# Patient Record
Sex: Male | Born: 1950
Health system: Southern US, Community
[De-identification: ages and names within clinical notes are randomized; demographics above are authoritative.]

## PROBLEM LIST (undated history)

## (undated) DIAGNOSIS — C779 Secondary and unspecified malignant neoplasm of lymph node, unspecified: Secondary | ICD-10-CM

## (undated) DIAGNOSIS — R490 Dysphonia: Secondary | ICD-10-CM

## (undated) DIAGNOSIS — E079 Disorder of thyroid, unspecified: Secondary | ICD-10-CM

## (undated) DIAGNOSIS — H269 Unspecified cataract: Secondary | ICD-10-CM

## (undated) DIAGNOSIS — Z87891 Personal history of nicotine dependence: Secondary | ICD-10-CM

## (undated) DIAGNOSIS — F419 Anxiety disorder, unspecified: Secondary | ICD-10-CM

## (undated) DIAGNOSIS — K219 Gastro-esophageal reflux disease without esophagitis: Secondary | ICD-10-CM

## (undated) DIAGNOSIS — F32A Depression, unspecified: Secondary | ICD-10-CM

## (undated) DIAGNOSIS — J449 Chronic obstructive pulmonary disease, unspecified: Secondary | ICD-10-CM

## (undated) DIAGNOSIS — F329 Major depressive disorder, single episode, unspecified: Secondary | ICD-10-CM

## (undated) DIAGNOSIS — M1990 Unspecified osteoarthritis, unspecified site: Secondary | ICD-10-CM

## (undated) HISTORY — DX: Secondary and unspecified malignant neoplasm of lymph node, unspecified: C77.9

## (undated) HISTORY — PX: NECK SURGERY: SHX720

## (undated) HISTORY — DX: Dysphonia: R49.0

## (undated) HISTORY — DX: Personal history of nicotine dependence: Z87.891

## (undated) HISTORY — DX: Chronic obstructive pulmonary disease, unspecified: J44.9

## (undated) HISTORY — PX: COLONOSCOPY: SHX174

## (undated) HISTORY — PX: TONSILLECTOMY: SUR1361

---

## 2004-05-24 ENCOUNTER — Emergency Department: Payer: Self-pay | Admitting: Emergency Medicine

## 2004-10-09 ENCOUNTER — Ambulatory Visit: Payer: Self-pay | Admitting: Oncology

## 2004-10-11 ENCOUNTER — Ambulatory Visit: Payer: Self-pay | Admitting: Oncology

## 2004-10-25 ENCOUNTER — Emergency Department: Payer: Self-pay | Admitting: Internal Medicine

## 2004-11-23 ENCOUNTER — Ambulatory Visit: Payer: Self-pay | Admitting: Internal Medicine

## 2005-07-26 ENCOUNTER — Ambulatory Visit: Payer: Self-pay

## 2005-10-08 ENCOUNTER — Ambulatory Visit: Payer: Self-pay | Admitting: Oncology

## 2005-10-11 ENCOUNTER — Ambulatory Visit: Payer: Self-pay | Admitting: Oncology

## 2005-11-29 ENCOUNTER — Ambulatory Visit: Payer: Self-pay | Admitting: Gastroenterology

## 2006-06-12 ENCOUNTER — Ambulatory Visit: Payer: Self-pay | Admitting: Oncology

## 2006-07-04 ENCOUNTER — Ambulatory Visit: Payer: Self-pay | Admitting: Oncology

## 2006-07-12 ENCOUNTER — Ambulatory Visit: Payer: Self-pay | Admitting: Oncology

## 2006-07-22 ENCOUNTER — Ambulatory Visit: Payer: Self-pay | Admitting: Oncology

## 2006-08-12 ENCOUNTER — Ambulatory Visit: Payer: Self-pay | Admitting: Oncology

## 2006-08-15 ENCOUNTER — Ambulatory Visit: Payer: Self-pay | Admitting: Otolaryngology

## 2006-10-12 ENCOUNTER — Ambulatory Visit: Payer: Self-pay | Admitting: Oncology

## 2006-10-28 ENCOUNTER — Ambulatory Visit: Payer: Self-pay | Admitting: Oncology

## 2006-11-12 ENCOUNTER — Ambulatory Visit: Payer: Self-pay | Admitting: Oncology

## 2007-01-12 ENCOUNTER — Ambulatory Visit: Payer: Self-pay | Admitting: Oncology

## 2007-02-02 ENCOUNTER — Ambulatory Visit: Payer: Self-pay | Admitting: Oncology

## 2007-02-12 ENCOUNTER — Ambulatory Visit: Payer: Self-pay | Admitting: Oncology

## 2007-04-17 ENCOUNTER — Ambulatory Visit: Payer: Self-pay | Admitting: Internal Medicine

## 2008-02-09 ENCOUNTER — Ambulatory Visit: Payer: Self-pay | Admitting: Oncology

## 2008-02-12 ENCOUNTER — Ambulatory Visit: Payer: Self-pay | Admitting: Oncology

## 2008-02-15 ENCOUNTER — Ambulatory Visit: Payer: Self-pay | Admitting: Oncology

## 2008-03-11 ENCOUNTER — Ambulatory Visit: Payer: Self-pay | Admitting: Oncology

## 2009-08-21 ENCOUNTER — Ambulatory Visit: Payer: Self-pay | Admitting: Internal Medicine

## 2010-03-05 ENCOUNTER — Other Ambulatory Visit: Payer: Self-pay | Admitting: Ophthalmology

## 2010-03-05 DIAGNOSIS — H5340 Unspecified visual field defects: Secondary | ICD-10-CM

## 2010-03-10 ENCOUNTER — Ambulatory Visit
Admission: RE | Admit: 2010-03-10 | Discharge: 2010-03-10 | Disposition: A | Discharge status: Home / Self Care | Payer: BC Managed Care – PPO | Source: Ambulatory Visit | Attending: Ophthalmology | Admitting: Ophthalmology

## 2010-03-10 DIAGNOSIS — H5340 Unspecified visual field defects: Secondary | ICD-10-CM

## 2010-06-30 ENCOUNTER — Ambulatory Visit: Payer: Self-pay

## 2010-11-28 ENCOUNTER — Emergency Department: Payer: Self-pay | Admitting: Emergency Medicine

## 2011-03-15 ENCOUNTER — Ambulatory Visit: Payer: Self-pay | Admitting: Internal Medicine

## 2011-07-13 ENCOUNTER — Ambulatory Visit: Payer: Self-pay | Admitting: Oncology

## 2011-07-13 LAB — COMPREHENSIVE METABOLIC PANEL
Albumin: 3.9 g/dL (ref 3.4–5.0)
Alkaline Phosphatase: 102 U/L (ref 50–136)
Calcium, Total: 9 mg/dL (ref 8.5–10.1)
Glucose: 144 mg/dL — ABNORMAL HIGH (ref 65–99)
Osmolality: 285 (ref 275–301)
SGPT (ALT): 47 U/L
Sodium: 140 mmol/L (ref 136–145)

## 2011-07-13 LAB — CBC CANCER CENTER
Basophil #: 0 x10 3/mm (ref 0.0–0.1)
Basophil %: 0.3 %
Eosinophil %: 2.2 %
HGB: 15.2 g/dL (ref 13.0–18.0)
MCH: 31.2 pg (ref 26.0–34.0)
MCV: 95 fL (ref 80–100)
Monocyte #: 0.6 x10 3/mm (ref 0.2–1.0)
Monocyte %: 5.7 %
Neutrophil %: 74.2 %
RBC: 4.89 10*6/uL (ref 4.40–5.90)
WBC: 9.8 x10 3/mm (ref 3.8–10.6)

## 2011-08-12 ENCOUNTER — Ambulatory Visit: Payer: Self-pay | Admitting: Oncology

## 2012-07-19 ENCOUNTER — Ambulatory Visit: Payer: Self-pay | Admitting: Oncology

## 2012-07-24 LAB — CBC CANCER CENTER
Basophil #: 0 x10 3/mm (ref 0.0–0.1)
Eosinophil %: 2.5 %
HCT: 43.6 % (ref 40.0–52.0)
Lymphocyte #: 1.7 x10 3/mm (ref 1.0–3.6)
Lymphocyte %: 19.4 %
MCHC: 34.1 g/dL (ref 32.0–36.0)
MCV: 93 fL (ref 80–100)
Monocyte #: 0.6 x10 3/mm (ref 0.2–1.0)
Platelet: 223 x10 3/mm (ref 150–440)

## 2012-07-24 LAB — COMPREHENSIVE METABOLIC PANEL
Albumin: 4 g/dL (ref 3.4–5.0)
Alkaline Phosphatase: 112 U/L (ref 50–136)
Anion Gap: 3 — ABNORMAL LOW (ref 7–16)
BUN: 24 mg/dL — ABNORMAL HIGH (ref 7–18)
Calcium, Total: 8.8 mg/dL (ref 8.5–10.1)
Chloride: 108 mmol/L — ABNORMAL HIGH (ref 98–107)
Co2: 30 mmol/L (ref 21–32)
Creatinine: 1.23 mg/dL (ref 0.60–1.30)
EGFR (African American): >60
Glucose: 128 mg/dL — ABNORMAL HIGH (ref 65–99)
Sodium: 141 mmol/L (ref 136–145)

## 2012-08-11 ENCOUNTER — Ambulatory Visit: Payer: Self-pay | Admitting: Oncology

## 2013-06-08 DIAGNOSIS — K219 Gastro-esophageal reflux disease without esophagitis: Secondary | ICD-10-CM | POA: Insufficient documentation

## 2013-06-08 DIAGNOSIS — E039 Hypothyroidism, unspecified: Secondary | ICD-10-CM | POA: Insufficient documentation

## 2013-06-08 DIAGNOSIS — E038 Other specified hypothyroidism: Secondary | ICD-10-CM | POA: Insufficient documentation

## 2013-06-15 DIAGNOSIS — I10 Essential (primary) hypertension: Secondary | ICD-10-CM | POA: Insufficient documentation

## 2013-06-15 DIAGNOSIS — Z8579 Personal history of other malignant neoplasms of lymphoid, hematopoietic and related tissues: Secondary | ICD-10-CM | POA: Insufficient documentation

## 2013-08-20 ENCOUNTER — Ambulatory Visit: Payer: Self-pay | Admitting: Oncology

## 2013-08-20 LAB — COMPREHENSIVE METABOLIC PANEL
Albumin: 3.7 g/dL (ref 3.4–5.0)
Alkaline Phosphatase: 111 U/L
Anion Gap: 7 (ref 7–16)
BUN: 29 mg/dL — AB (ref 7–18)
Bilirubin,Total: 0.3 mg/dL (ref 0.2–1.0)
CHLORIDE: 105 mmol/L (ref 98–107)
CO2: 29 mmol/L (ref 21–32)
Calcium, Total: 8.8 mg/dL (ref 8.5–10.1)
Creatinine: 1.32 mg/dL — ABNORMAL HIGH (ref 0.60–1.30)
EGFR (African American): >60
EGFR (Non-African Amer.): 58 — ABNORMAL LOW
Glucose: 123 mg/dL — ABNORMAL HIGH (ref 65–99)
Osmolality: 288 (ref 275–301)
POTASSIUM: 4.1 mmol/L (ref 3.5–5.1)
SGOT(AST): 29 U/L (ref 15–37)
SGPT (ALT): 34 U/L
Sodium: 141 mmol/L (ref 136–145)
TOTAL PROTEIN: 6.9 g/dL (ref 6.4–8.2)

## 2013-08-20 LAB — CBC CANCER CENTER
BASOS ABS: 0.1 x10 3/mm (ref 0.0–0.1)
Basophil %: 0.7 %
Eosinophil #: 0.2 x10 3/mm (ref 0.0–0.7)
Eosinophil %: 3.1 %
HCT: 43.7 % (ref 40.0–52.0)
HGB: 14.6 g/dL (ref 13.0–18.0)
LYMPHS PCT: 21.3 %
Lymphocyte #: 1.6 x10 3/mm (ref 1.0–3.6)
MCH: 31.5 pg (ref 26.0–34.0)
MCHC: 33.4 g/dL (ref 32.0–36.0)
MCV: 94 fL (ref 80–100)
MONO ABS: 0.6 x10 3/mm (ref 0.2–1.0)
MONOS PCT: 7.6 %
NEUTROS PCT: 67.3 %
Neutrophil #: 5.2 x10 3/mm (ref 1.4–6.5)
Platelet: 221 x10 3/mm (ref 150–440)
RBC: 4.63 10*6/uL (ref 4.40–5.90)
RDW: 13.1 % (ref 11.5–14.5)
WBC: 7.7 x10 3/mm (ref 3.8–10.6)

## 2013-08-31 ENCOUNTER — Ambulatory Visit: Payer: Self-pay | Admitting: Family Medicine

## 2013-09-11 ENCOUNTER — Ambulatory Visit: Payer: Self-pay | Admitting: Oncology

## 2014-03-27 ENCOUNTER — Other Ambulatory Visit (INDEPENDENT_AMBULATORY_CARE_PROVIDER_SITE_OTHER): Payer: Self-pay

## 2014-03-27 DIAGNOSIS — R109 Unspecified abdominal pain: Secondary | ICD-10-CM

## 2014-03-27 DIAGNOSIS — M549 Dorsalgia, unspecified: Secondary | ICD-10-CM

## 2014-04-02 ENCOUNTER — Ambulatory Visit
Admission: RE | Admit: 2014-04-02 | Discharge: 2014-04-02 | Disposition: A | Discharge status: Home / Self Care | Payer: BLUE CROSS/BLUE SHIELD | Source: Ambulatory Visit | Attending: Surgery | Admitting: Surgery

## 2014-04-02 MED ORDER — IOPAMIDOL (ISOVUE-300) INJECTION 61%
125.0000 mL | Freq: Once | INTRAVENOUS | Status: AC | PRN
  Administered 2014-04-02: 125 mL via INTRAVENOUS

## 2014-08-23 ENCOUNTER — Other Ambulatory Visit: Payer: Self-pay | Admitting: *Deleted

## 2014-08-23 DIAGNOSIS — C760 Malignant neoplasm of head, face and neck: Secondary | ICD-10-CM

## 2014-08-26 ENCOUNTER — Inpatient Hospital Stay: Payer: BLUE CROSS/BLUE SHIELD | Admitting: Oncology

## 2014-08-26 ENCOUNTER — Inpatient Hospital Stay: Payer: BLUE CROSS/BLUE SHIELD | Attending: Oncology

## 2014-09-06 ENCOUNTER — Telehealth: Payer: Self-pay

## 2014-09-06 DIAGNOSIS — E7800 Pure hypercholesterolemia, unspecified: Secondary | ICD-10-CM | POA: Insufficient documentation

## 2014-09-06 DIAGNOSIS — Z720 Tobacco use: Secondary | ICD-10-CM | POA: Insufficient documentation

## 2014-09-06 NOTE — Telephone Encounter (Signed)
Telephone call to patient to schedule f/up CT Scan. Patient agreeable to annual scan.

## 2014-09-23 ENCOUNTER — Other Ambulatory Visit: Payer: Self-pay | Admitting: Family Medicine

## 2014-09-23 ENCOUNTER — Encounter: Payer: Self-pay | Admitting: Family Medicine

## 2014-09-23 DIAGNOSIS — Z87891 Personal history of nicotine dependence: Secondary | ICD-10-CM | POA: Insufficient documentation

## 2014-09-23 HISTORY — DX: Personal history of nicotine dependence: Z87.891

## 2014-09-25 ENCOUNTER — Inpatient Hospital Stay: Payer: BLUE CROSS/BLUE SHIELD | Attending: Oncology

## 2014-09-25 ENCOUNTER — Inpatient Hospital Stay: Payer: BLUE CROSS/BLUE SHIELD | Admitting: Oncology

## 2014-09-25 DIAGNOSIS — Z809 Family history of malignant neoplasm, unspecified: Secondary | ICD-10-CM | POA: Insufficient documentation

## 2014-09-25 DIAGNOSIS — C801 Malignant (primary) neoplasm, unspecified: Secondary | ICD-10-CM | POA: Insufficient documentation

## 2014-09-25 DIAGNOSIS — R531 Weakness: Secondary | ICD-10-CM | POA: Insufficient documentation

## 2014-09-25 DIAGNOSIS — R490 Dysphonia: Secondary | ICD-10-CM | POA: Insufficient documentation

## 2014-09-25 DIAGNOSIS — C770 Secondary and unspecified malignant neoplasm of lymph nodes of head, face and neck: Secondary | ICD-10-CM | POA: Insufficient documentation

## 2014-09-25 DIAGNOSIS — R911 Solitary pulmonary nodule: Secondary | ICD-10-CM | POA: Insufficient documentation

## 2014-09-25 DIAGNOSIS — Z9221 Personal history of antineoplastic chemotherapy: Secondary | ICD-10-CM | POA: Insufficient documentation

## 2014-09-25 DIAGNOSIS — Z79899 Other long term (current) drug therapy: Secondary | ICD-10-CM | POA: Insufficient documentation

## 2014-09-25 DIAGNOSIS — Z923 Personal history of irradiation: Secondary | ICD-10-CM | POA: Insufficient documentation

## 2014-09-25 DIAGNOSIS — F17210 Nicotine dependence, cigarettes, uncomplicated: Secondary | ICD-10-CM | POA: Insufficient documentation

## 2014-09-25 DIAGNOSIS — E039 Hypothyroidism, unspecified: Secondary | ICD-10-CM | POA: Insufficient documentation

## 2014-09-25 DIAGNOSIS — R42 Dizziness and giddiness: Secondary | ICD-10-CM | POA: Insufficient documentation

## 2014-09-25 DIAGNOSIS — R0602 Shortness of breath: Secondary | ICD-10-CM | POA: Insufficient documentation

## 2014-10-01 ENCOUNTER — Ambulatory Visit
Admission: RE | Admit: 2014-10-01 | Discharge: 2014-10-01 | Disposition: A | Discharge status: Home / Self Care | Payer: BLUE CROSS/BLUE SHIELD | Source: Ambulatory Visit | Attending: Family Medicine | Admitting: Family Medicine

## 2014-10-01 ENCOUNTER — Telehealth: Payer: Self-pay | Admitting: *Deleted

## 2014-10-01 DIAGNOSIS — Z87891 Personal history of nicotine dependence: Secondary | ICD-10-CM | POA: Diagnosis not present

## 2014-10-01 NOTE — Telephone Encounter (Signed)
For lung cancer screening, confirmed that patient is within the age range of 55-77, and asymptomatic, (no signs or symptoms of lung cancer). The patient is a current smoker, with a 91 pack year history. The shared decision making visit was done 08/31/13. Patient is agreeable for CT scan being scheduled.

## 2014-10-02 ENCOUNTER — Inpatient Hospital Stay (HOSPITAL_BASED_OUTPATIENT_CLINIC_OR_DEPARTMENT_OTHER): Payer: BLUE CROSS/BLUE SHIELD | Admitting: Oncology

## 2014-10-02 ENCOUNTER — Inpatient Hospital Stay: Payer: BLUE CROSS/BLUE SHIELD

## 2014-10-02 ENCOUNTER — Encounter: Payer: Self-pay | Admitting: Oncology

## 2014-10-02 VITALS — BP 98/68 | HR 76 | Temp 95.2°F | Wt 199.2 lb

## 2014-10-02 DIAGNOSIS — C801 Malignant (primary) neoplasm, unspecified: Secondary | ICD-10-CM

## 2014-10-02 DIAGNOSIS — E039 Hypothyroidism, unspecified: Secondary | ICD-10-CM

## 2014-10-02 DIAGNOSIS — C760 Malignant neoplasm of head, face and neck: Secondary | ICD-10-CM

## 2014-10-02 DIAGNOSIS — Z923 Personal history of irradiation: Secondary | ICD-10-CM | POA: Diagnosis not present

## 2014-10-02 DIAGNOSIS — Z9221 Personal history of antineoplastic chemotherapy: Secondary | ICD-10-CM

## 2014-10-02 DIAGNOSIS — Z809 Family history of malignant neoplasm, unspecified: Secondary | ICD-10-CM

## 2014-10-02 DIAGNOSIS — R0602 Shortness of breath: Secondary | ICD-10-CM | POA: Diagnosis not present

## 2014-10-02 DIAGNOSIS — C770 Secondary and unspecified malignant neoplasm of lymph nodes of head, face and neck: Secondary | ICD-10-CM

## 2014-10-02 DIAGNOSIS — R490 Dysphonia: Secondary | ICD-10-CM | POA: Diagnosis not present

## 2014-10-02 DIAGNOSIS — Z8579 Personal history of other malignant neoplasms of lymphoid, hematopoietic and related tissues: Secondary | ICD-10-CM

## 2014-10-02 DIAGNOSIS — F17210 Nicotine dependence, cigarettes, uncomplicated: Secondary | ICD-10-CM

## 2014-10-02 DIAGNOSIS — R42 Dizziness and giddiness: Secondary | ICD-10-CM

## 2014-10-02 DIAGNOSIS — Z79899 Other long term (current) drug therapy: Secondary | ICD-10-CM

## 2014-10-02 DIAGNOSIS — R531 Weakness: Secondary | ICD-10-CM | POA: Diagnosis not present

## 2014-10-02 DIAGNOSIS — R911 Solitary pulmonary nodule: Secondary | ICD-10-CM | POA: Diagnosis not present

## 2014-10-02 LAB — CBC WITH DIFFERENTIAL/PLATELET
Basophils Absolute: 0 10*3/uL (ref 0–0.1)
Basophils Relative: 1 %
EOS ABS: 0.2 10*3/uL (ref 0–0.7)
EOS PCT: 3 %
HCT: 49 % (ref 40.0–52.0)
Hemoglobin: 16.2 g/dL (ref 13.0–18.0)
Lymphocytes Relative: 18 %
Lymphs Abs: 1.3 10*3/uL (ref 1.0–3.6)
MCH: 31.3 pg (ref 26.0–34.0)
MCHC: 33.2 g/dL (ref 32.0–36.0)
MCV: 94.4 fL (ref 80.0–100.0)
MONOS PCT: 6 %
Monocytes Absolute: 0.5 10*3/uL (ref 0.2–1.0)
Neutro Abs: 5.4 10*3/uL (ref 1.4–6.5)
Neutrophils Relative %: 72 %
PLATELETS: 210 10*3/uL (ref 150–440)
RBC: 5.19 MIL/uL (ref 4.40–5.90)
RDW: 13 % (ref 11.5–14.5)
WBC: 7.4 10*3/uL (ref 3.8–10.6)

## 2014-10-02 LAB — COMPREHENSIVE METABOLIC PANEL
ALT: 21 U/L (ref 17–63)
ANION GAP: 8 (ref 5–15)
AST: 32 U/L (ref 15–41)
Albumin: 4.5 g/dL (ref 3.5–5.0)
Alkaline Phosphatase: 83 U/L (ref 38–126)
BUN: 17 mg/dL (ref 6–20)
CHLORIDE: 101 mmol/L (ref 101–111)
CO2: 27 mmol/L (ref 22–32)
Calcium: 9.5 mg/dL (ref 8.9–10.3)
Creatinine, Ser: 0.94 mg/dL (ref 0.61–1.24)
GFR calc non Af Amer: >60 mL/min (ref >60)
GLUCOSE: 130 mg/dL — AB (ref 65–99)
POTASSIUM: 4.1 mmol/L (ref 3.5–5.1)
SODIUM: 136 mmol/L (ref 135–145)
Total Bilirubin: 0.7 mg/dL (ref 0.3–1.2)
Total Protein: 7.4 g/dL (ref 6.5–8.1)

## 2014-10-02 NOTE — Progress Notes (Signed)
[place] @ [facility] [phone]  Fax:([phone] [place]: Dec 20, 1950  MR#: [id number]  ASN#:[id number]  Patient Care Team: No Pcp Per Patient as PCP - General (General Practice)  CHIEF COMPLAINT:  Chief Complaint  Patient presents with  . OTHER   Chief Complaint/Diagnosis:   1. 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  In February of 2001 2. Continuing tobacco abuse HPI:    VISIT DIAGNOSIS Metastatic poorly differentiated carcinoma to lymph node unknown primary   No history exists.    No flowsheet data found.  INTERVAL HISTORY:  64 year old gentleman came today to see me with a history of complaining hoarseness of voice patient was not seen by an ENT physician in last few years. Hoarseness of voice started 6 months to  12 months duration Also complains of dizziness.  Patient works out in the heat in her oral intake of fluid is much less. Patient continues to smoke and had down low dose CT scan done yesterday and desires to know the result. Patient is here for further follow-up regarding metastases poorly differentiated carcinoma to lymph node unknown primary status post chemotherapy radiation and resection in February of 2001 REVIEW OF SYSTEMS:   Gen. status: Patient is feeling somewhat weak and tired and feeling dizzy. Patient works outside   In heat and the sweats profusely\ HEENT: Dryness in the mouth hoarseness of voice no difficulty swallowing Lungs: Shortness of breath on exertion patient continues to smoke no hemoptysis GI: No nausea no vomiting no diarrhea no significant weight loss GU: No dysuria hematuria Skin: No rash no rash all other systems have been reviewed no positive findings Neurological system: Other than dizziness does not have any headache or any other significant problem As per HPI.  Otherwise, a complete review of systems is negatve.  PAST MEDICAL HISTORY: Past Medical History  Diagnosis Date  . Personal history of tobacco use, presenting hazards to health 09/23/2014    Smoking History: Smoking History 1 Packs per day. Smoking Cessation Information Given to Patient .  PFSH: Comments: carcinoma of breast in mother carcinoma of uterus melanoma and bladder in the family history of hypertension in the family  Social History: positive tobacco  Comments: continues to smoke,  Works full-time as maintenance man  Additional Past Medical and Surgical History: tonsillectomy in the past   Hypothyroidism      ADVANCED DIRECTIVES:  Patient does have advance healthcare directive, Patient   does not desire to make any changes  HEALTH MAINTENANCE: Social History  Substance Use Topics  . Smoking status: Current Every Day Smoker  . Smokeless tobacco: None  . Alcohol Use: None      No Known Allergies  Current Outpatient Prescriptions  Medication Sig Dispense Refill  . ALPRAZolam (XANAX) 0.25 MG tablet Take by mouth.    Marland Kitchen ibuprofen (ADVIL,MOTRIN) 800 MG tablet TAKE 1 TABLET BY MOUTH EVERY 6 HOURS AS NEEDED    . levothyroxine (SYNTHROID, LEVOTHROID) 88 MCG tablet TAKE 1 TABLET BY MOUTH ONCE A DAY ON AN EMPTY STOMACH.    . meloxicam (MOBIC) 15 MG tablet TK 1 T PO QD  5  . nicotine (RA NICOTINE) 21 mg/24hr patch Place onto the skin.    Marland Kitchen omeprazole (PRILOSEC) 20 MG capsule TAKE ONE CAPSULE BY MOUTH EVERY DAY FOR HEART BURN    . simvastatin (ZOCOR) 20 MG tablet Take by mouth.  No current facility-administered medications for this visit.    OBJECTIVE:  general status: Patient is feeling weak and tired.  No change in a performance status.  No chills.  No fever. HEENT:  No evidence of stomatitis Dryness in the mouth.  No other abnormality detected He did not see any masses. Lungs: No cough or shortness of breath Cardiac: No chest pain or paroxysmal nocturnal  dyspnea GI: No nausea no vomiting no diarrhea no abdominal pain Skin: No rash Lower extremity no swelling Neurological system: No tingling.  No numbness.  No other focal signs Musculoskeletal system no bony pains   Filed Vitals:   10/02/14 1056  BP: 98/68  Pulse: 76  Temp: 95.2 F (35.1 C)     Body mass index is 26.29 kg/(m^2).    ECOG FS:01  LAB RESULTS:  Appointment on 10/02/2014  Component Date Value Ref Range Status  . WBC 10/02/2014 7.4  3.8 - 10.6 K/uL Final  . RBC 10/02/2014 5.19  4.40 - 5.90 MIL/uL Final  . Hemoglobin 10/02/2014 16.2  13.0 - 18.0 g/dL Final  . HCT 10/02/2014 49.0  40.0 - 52.0 % Final  . MCV 10/02/2014 94.4  80.0 - 100.0 fL Final  . MCH 10/02/2014 31.3  26.0 - 34.0 pg Final  . MCHC 10/02/2014 33.2  32.0 - 36.0 g/dL Final  . RDW 10/02/2014 13.0  11.5 - 14.5 % Final  . Platelets 10/02/2014 210  150 - 440 K/uL Final  . Neutrophils Relative % 10/02/2014 72   Final  . Neutro Abs 10/02/2014 5.4  1.4 - 6.5 K/uL Final  . Lymphocytes Relative 10/02/2014 18   Final  . Lymphs Abs 10/02/2014 1.3  1.0 - 3.6 K/uL Final  . Monocytes Relative 10/02/2014 6   Final  . Monocytes Absolute 10/02/2014 0.5  0.2 - 1.0 K/uL Final  . Eosinophils Relative 10/02/2014 3   Final  . Eosinophils Absolute 10/02/2014 0.2  0 - 0.7 K/uL Final  . Basophils Relative 10/02/2014 1   Final  . Basophils Absolute 10/02/2014 0.0  0 - 0.1 K/uL Final  . Sodium 10/02/2014 136  135 - 145 mmol/L Final  . Potassium 10/02/2014 4.1  3.5 - 5.1 mmol/L Final  . Chloride 10/02/2014 101  101 - 111 mmol/L Final  . CO2 10/02/2014 27  22 - 32 mmol/L Final  . Glucose, Bld 10/02/2014 130* 65 - 99 mg/dL Final  . BUN 10/02/2014 17  6 - 20 mg/dL Final  . Creatinine, Ser 10/02/2014 0.94  0.61 - 1.24 mg/dL Final  . Calcium 10/02/2014 9.5  8.9 - 10.3 mg/dL Final  . Total Protein 10/02/2014 7.4  6.5 - 8.1 g/dL Final  . Albumin 10/02/2014 4.5  3.5 - 5.0 g/dL Final  . AST 10/02/2014 32  15 - 41 U/L Final  . ALT  10/02/2014 21  17 - 63 U/L Final  . Alkaline Phosphatase 10/02/2014 83  38 - 126 U/L Final  . Total Bilirubin 10/02/2014 0.7  0.3 - 1.2 mg/dL Final  . GFR calc non Af Amer 10/02/2014 >60  >60 mL/min Final  . GFR calc Af Amer 10/02/2014 >60  >60 mL/min Final   Comment: (NOTE) The eGFR has been calculated using the CKD EPI equation. This calculation has not been validated in all clinical situations. eGFR's persistently <60 mL/min signify possible Chronic Kidney Disease.   . Anion gap 10/02/2014 8  5 - 15 Final     STUDIES: Ct Chest Lung Ca Screen Low Dose W/o  Cm  10/01/2014   CLINICAL DATA:  Lung cancer screening. Forty-six pack-year history. Asymptomatic smoker.  EXAM: CT CHEST WITHOUT CONTRAST  TECHNIQUE: Multidetector CT imaging of the chest was performed following the standard protocol without IV contrast.  COMPARISON:  08/31/2013  FINDINGS: Mediastinum: The heart size appears normal. The trachea is patent and appears midline. Normal appearance of the esophagus. No enlarged mediastinal or hilar lymph nodes identified.  Lungs/Pleura: No pleural effusion identified. New anteromedial subpleural nodule within the right upper lobe is identified, image number 202 of series 3. This has an equivalent diameter 5.7 mm.  Upper Abdomen: There is no suspicious liver abnormality identified. The spleen is normal. Normal appearance of the adrenal glands. The visualized portions of the pancreas is unremarkable.  Musculoskeletal: There is no aggressive lytic or sclerotic bone lesion. No suspicious bone abnormality noted.  IMPRESSION: 1. Lung-Rads category 3, probably benign findings. Short-term follow-up in 6 months is recommended with repeat low-dose chest CT without contrast (please use the following order, "CT CHEST LUNG CA SCREEN LOW DOSE W/O CM").   Electronically Signed   By: Kerby Moors M.D.   On: 10/01/2014 13:03    ASSESSMENT:  Metastatic poorly differentiated carcinoma to cervical lymph node from  unknown primary There is no evidence of recurrent disease based on clinical examination Had the low-dose CT scan which has been reviewed Hoarseness of voice Dizziness  PLAN:   Hoarseness of voice Will check T4 TSH Will arrange for ENT evaluation and to rule out any second primary tumor.2.  Dizziness: May be due to dehydration blood pressure is slightly low.  Patient was advised to drink a lot of fluid and if dizziness does not get better to get in touch with me Chronic smoker Patient has received education as well as nicotine patch and patient is seriously thinking quitting smoking patient was encouraged Low-dose CT scan Has been reviewed there is subpleural nodule in the right upper lobe has been identified It is 5.7 mm.  Scan has been reviewed independently by me and reviewed with the patient At this point in time as recommended by protocol patient would get another low-dose CT scan in 6 month to ensure stability I discussed that with the patient and he is in agreement with it We also discussed flu shot and patient desires to take flu shot.  Reevaluation of the patient in 6 month after another CT scan or before if any other abnormality was found on ENT evaluation or T4 TSH is found abnormal Patient also was advised to get primary care physician for routine evaluation and health maintenance   all other lab data has been reviewed and no abnormality detected     Patient expressed understanding and was in agreement with this plan. He also understands that He can call clinic at any time with any questions, concerns, or complaints.    No matching staging information was found for the patient.  Forest Gleason, MD   10/02/2014 11:25 AM

## 2014-10-02 NOTE — Progress Notes (Signed)
Patient does not have living will.  Current every day smoker.  BP 98/68.  Patient states his urine is dark and he is dizzy.

## 2015-01-10 LAB — HM HIV SCREENING LAB: HM HIV SCREENING: NEGATIVE

## 2015-04-01 ENCOUNTER — Other Ambulatory Visit: Payer: BLUE CROSS/BLUE SHIELD

## 2015-04-01 ENCOUNTER — Ambulatory Visit: Payer: BLUE CROSS/BLUE SHIELD | Admitting: Oncology

## 2015-04-04 ENCOUNTER — Telehealth: Payer: Self-pay | Admitting: *Deleted

## 2015-04-04 ENCOUNTER — Inpatient Hospital Stay: Payer: BLUE CROSS/BLUE SHIELD | Admitting: Oncology

## 2015-04-04 ENCOUNTER — Inpatient Hospital Stay: Payer: BLUE CROSS/BLUE SHIELD | Attending: Oncology

## 2015-04-04 DIAGNOSIS — Z8579 Personal history of other malignant neoplasms of lymphoid, hematopoietic and related tissues: Secondary | ICD-10-CM

## 2015-04-04 DIAGNOSIS — Z8572 Personal history of non-Hodgkin lymphomas: Secondary | ICD-10-CM | POA: Diagnosis not present

## 2015-04-04 LAB — COMPREHENSIVE METABOLIC PANEL
ALT: 18 U/L (ref 17–63)
ANION GAP: 5 (ref 5–15)
AST: 28 U/L (ref 15–41)
Albumin: 4.6 g/dL (ref 3.5–5.0)
Alkaline Phosphatase: 83 U/L (ref 38–126)
BILIRUBIN TOTAL: 0.4 mg/dL (ref 0.3–1.2)
BUN: 21 mg/dL — AB (ref 6–20)
CHLORIDE: 103 mmol/L (ref 101–111)
CO2: 29 mmol/L (ref 22–32)
CREATININE: 1.02 mg/dL (ref 0.61–1.24)
Calcium: 9 mg/dL (ref 8.9–10.3)
GFR calc Af Amer: >60 mL/min (ref >60)
GLUCOSE: 164 mg/dL — AB (ref 65–99)
POTASSIUM: 3.9 mmol/L (ref 3.5–5.1)
Sodium: 137 mmol/L (ref 135–145)
Total Protein: 7.2 g/dL (ref 6.5–8.1)

## 2015-04-04 LAB — CBC WITH DIFFERENTIAL/PLATELET
BASOS ABS: 0 10*3/uL (ref 0–0.1)
Basophils Relative: 1 %
EOS PCT: 2 %
Eosinophils Absolute: 0.2 10*3/uL (ref 0–0.7)
HEMATOCRIT: 46.6 % (ref 40.0–52.0)
Hemoglobin: 15.9 g/dL (ref 13.0–18.0)
LYMPHS PCT: 15 %
Lymphs Abs: 1.2 10*3/uL (ref 1.0–3.6)
MCH: 32.4 pg (ref 26.0–34.0)
MCHC: 34.2 g/dL (ref 32.0–36.0)
MCV: 94.5 fL (ref 80.0–100.0)
MONO ABS: 0.4 10*3/uL (ref 0.2–1.0)
MONOS PCT: 5 %
NEUTROS ABS: 6.2 10*3/uL (ref 1.4–6.5)
Neutrophils Relative %: 77 %
PLATELETS: 209 10*3/uL (ref 150–440)
RBC: 4.93 MIL/uL (ref 4.40–5.90)
RDW: 12.9 % (ref 11.5–14.5)
WBC: 8.1 10*3/uL (ref 3.8–10.6)

## 2015-04-04 LAB — TSH: TSH: 1.712 u[IU]/mL (ref 0.350–4.500)

## 2015-04-04 NOTE — Telephone Encounter (Signed)
Patient had appointment today for Lab/MD.  Patient had labs drawn, however left without seeing the MD.  There was a woman with him who stated they had other obligaitons.  MD notified.  Nada Boozer, RN

## 2015-04-05 LAB — T4: T4, Total: 7.4 ug/dL (ref 4.5–12.0)

## 2015-05-01 ENCOUNTER — Inpatient Hospital Stay: Payer: BLUE CROSS/BLUE SHIELD | Attending: Oncology | Admitting: Oncology

## 2015-05-02 ENCOUNTER — Encounter: Payer: Self-pay | Admitting: *Deleted

## 2015-05-02 NOTE — Progress Notes (Signed)
Pt no show for appt on 05/01/15. Per MD, pt can follow up with PCP and does not need follow up at the cancer center at this time. Certified letter has been sent to patient informing him of this information.

## 2015-05-05 ENCOUNTER — Telehealth: Payer: Self-pay | Admitting: *Deleted

## 2015-05-05 NOTE — Telephone Encounter (Signed)
Notified patient that his 6 month follow up lung cancer screening low dose CT scan is due. Confirmed that patient is within age range of 55-77, asymptomatic of lung cancer, and no other serious disease processes that would make treatment of lung cancer not possible. The patient is a current smoker with a 92 pack/ year history. The shared decision making visit was completed 08/31/13. The patient is agreeable for CT scan to be scheduled. He prefers afternoons between 2:30 and 3:00PM.

## 2015-05-16 ENCOUNTER — Other Ambulatory Visit: Payer: Self-pay | Admitting: Family Medicine

## 2015-05-16 DIAGNOSIS — Z87891 Personal history of nicotine dependence: Secondary | ICD-10-CM

## 2015-05-28 ENCOUNTER — Ambulatory Visit
Admission: RE | Admit: 2015-05-28 | Discharge: 2015-05-28 | Disposition: A | Discharge status: Home / Self Care | Payer: BLUE CROSS/BLUE SHIELD | Source: Ambulatory Visit | Attending: Family Medicine | Admitting: Family Medicine

## 2015-05-28 DIAGNOSIS — J439 Emphysema, unspecified: Secondary | ICD-10-CM | POA: Diagnosis not present

## 2015-05-28 DIAGNOSIS — Z87891 Personal history of nicotine dependence: Secondary | ICD-10-CM | POA: Diagnosis present

## 2015-10-09 ENCOUNTER — Inpatient Hospital Stay: Payer: BLUE CROSS/BLUE SHIELD | Attending: Internal Medicine | Admitting: Internal Medicine

## 2015-10-09 ENCOUNTER — Encounter: Payer: Self-pay | Admitting: Internal Medicine

## 2015-10-09 ENCOUNTER — Inpatient Hospital Stay: Payer: BLUE CROSS/BLUE SHIELD

## 2015-10-09 VITALS — BP 149/89 | HR 88 | Temp 97.8°F | Resp 20 | Ht 73.0 in | Wt 207.0 lb

## 2015-10-09 DIAGNOSIS — Z803 Family history of malignant neoplasm of breast: Secondary | ICD-10-CM | POA: Diagnosis not present

## 2015-10-09 DIAGNOSIS — Z23 Encounter for immunization: Secondary | ICD-10-CM | POA: Diagnosis not present

## 2015-10-09 DIAGNOSIS — Z8589 Personal history of malignant neoplasm of other organs and systems: Secondary | ICD-10-CM | POA: Insufficient documentation

## 2015-10-09 DIAGNOSIS — Z923 Personal history of irradiation: Secondary | ICD-10-CM | POA: Diagnosis not present

## 2015-10-09 DIAGNOSIS — Z79899 Other long term (current) drug therapy: Secondary | ICD-10-CM | POA: Insufficient documentation

## 2015-10-09 DIAGNOSIS — Z9221 Personal history of antineoplastic chemotherapy: Secondary | ICD-10-CM | POA: Diagnosis not present

## 2015-10-09 DIAGNOSIS — C760 Malignant neoplasm of head, face and neck: Secondary | ICD-10-CM | POA: Insufficient documentation

## 2015-10-09 DIAGNOSIS — R490 Dysphonia: Secondary | ICD-10-CM

## 2015-10-09 DIAGNOSIS — F17210 Nicotine dependence, cigarettes, uncomplicated: Secondary | ICD-10-CM | POA: Diagnosis not present

## 2015-10-09 DIAGNOSIS — R0602 Shortness of breath: Secondary | ICD-10-CM | POA: Insufficient documentation

## 2015-10-09 MED ORDER — INFLUENZA VAC SPLIT QUAD 0.5 ML IM SUSY
0.5000 mL | PREFILLED_SYRINGE | Freq: Once | INTRAMUSCULAR | Status: AC
  Administered 2015-10-09: 0.5 mL via INTRAMUSCULAR

## 2015-10-09 MED ORDER — INFLUENZA VAC SPLIT QUAD 0.5 ML IM SUSY
PREFILLED_SYRINGE | INTRAMUSCULAR | Status: AC
  Filled 2015-10-09: qty 0.5

## 2015-10-09 NOTE — Assessment & Plan Note (Signed)
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  # Tobacco abuse- not interested in quitting smoking. Consult to quit smoking. Under low-dose CT protocol for lung cancer screening.  # Follow-up in one year CBC CMP TSH

## 2015-10-09 NOTE — Progress Notes (Signed)
Patient here for f/u with Dr. Rogue Bussing for h/o lymhoma.

## 2015-10-09 NOTE — Progress Notes (Signed)
[place] OFFICE PROGRESS NOTE  Patient Care Team: Derinda Late, MD as PCP - General (Family Medicine)  No matching staging information was found for the patient.   Oncology History   1. 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  In February of 2001;    2. Continuing tobacco abuse/ LDCT- May 2017-NED.      Cancer of head and neck ([place])   10/09/2015 Initial Diagnosis    Cancer of head and neck [facility])       This is my first interaction with the patient as patient's primary oncologist has been Dr.Choksi. I reviewed the patient's prior charts/pertinent labs/imaging in detail; findings are summarized above.     INTERVAL HISTORY:  Luke [name] 65 y.o.  male pleasant patient above history of Head and neck cancer-squamous cell unknown primary status post chemoradiation is here for follow-up.  Patient denies any lumps or bumps. Appetite is good. Unfortunately continues to smoke. Has chronic mild shortness of breath. No hemoptysis. No weight loss.  REVIEW OF SYSTEMS:  A complete 10 point review of system is done which is negative except mentioned above/history of present illness.   PAST MEDICAL HISTORY :  Past Medical History:  Diagnosis Date  . Carcinoma metastatic to lymph node ([place])   . Hoarseness of voice   . Personal history of tobacco use, presenting hazards to health February of 2001    PAST SURGICAL HISTORY :   Past Surgical History:  Procedure Laterality Date  . TONSILLECTOMY      FAMILY HISTORY :   Family History  Problem Relation Age of Onset  . Breast cancer Mother   . Heart attack Father     SOCIAL HISTORY:   Social History  Substance Use Topics  . Smoking status: Current Every Day Smoker    Packs/day: 0.25    Years: 40.00    Types: Cigarettes  . Smokeless tobacco: Never Used  . Alcohol use No    ALLERGIES:   has No Known Allergies.  MEDICATIONS:  Current Outpatient Prescriptions  Medication Sig Dispense Refill  . ibuprofen (ADVIL,MOTRIN) 800 MG tablet TAKE 1 TABLET BY MOUTH EVERY 6 HOURS AS NEEDED    . levothyroxine (SYNTHROID, LEVOTHROID) 88 MCG tablet TAKE 1 TABLET BY MOUTH ONCE A DAY ON AN EMPTY STOMACH.    . meloxicam (MOBIC) 15 MG tablet TK 1 T PO QD  5  . nicotine (RA NICOTINE) 21 mg/24hr patch Place onto the skin.    Marland Kitchen omeprazole (PRILOSEC) 20 MG capsule TAKE ONE CAPSULE BY MOUTH EVERY DAY FOR HEART BURN    . simvastatin (ZOCOR) 20 MG tablet Take by mouth.     No current facility-administered medications for this visit.     PHYSICAL EXAMINATION: ECOG PERFORMANCE STATUS: 0 - Asymptomatic  BP (!) 149/89 (Patient Position: Sitting)   Pulse 88   Temp 97.8 F (36.6 C) (Tympanic)   Resp 20   Ht 6\' 1"  (1.854 m)   Wt 207 lb (93.9 kg)   BMI 27.31 kg/m   Filed Weights   10/09/15 1521  Weight: 207 lb (93.9 kg)    GENERAL: Well-nourished well-developed; Alert, no distress and comfortable.   Alone.  EYES: no pallor or icterus OROPHARYNX: no thrush or ulceration; good dentition  NECK: supple, no masses felt LYMPH:  no palpable lymphadenopathy in the cervical, axillary or inguinal regions LUNGS: clear to auscultation and  No wheeze or crackles HEART/CVS: regular rate & rhythm and no murmurs; No lower extremity edema ABDOMEN:abdomen soft, non-tender and normal bowel sounds Musculoskeletal:no cyanosis of digits and no clubbing  PSYCH: alert & oriented x 3 with fluent speech NEURO: no focal motor/sensory deficits SKIN:  no rashes or significant lesions  LABORATORY DATA:  I have reviewed the data as listed    Component Value Date/Time   NA 137 04/04/2015 1334   NA 141 08/20/2013 1504   K 3.9 04/04/2015 1334   K 4.1 08/20/2013 1504   CL 103 04/04/2015 1334   CL 105 08/20/2013 1504   CO2 29 04/04/2015 1334   CO2 29 08/20/2013 1504   GLUCOSE 164 (H) 04/04/2015 1334   GLUCOSE  123 (H) 08/20/2013 1504   BUN 21 (H) 04/04/2015 1334   BUN 29 (H) 08/20/2013 1504   CREATININE 1.02 04/04/2015 1334   CREATININE 1.32 (H) 08/20/2013 1504   CALCIUM 9.0 04/04/2015 1334   CALCIUM 8.8 08/20/2013 1504   PROT 7.2 04/04/2015 1334   PROT 6.9 08/20/2013 1504   ALBUMIN 4.6 04/04/2015 1334   ALBUMIN 3.7 08/20/2013 1504   AST 28 04/04/2015 1334   AST 29 08/20/2013 1504   ALT 18 04/04/2015 1334   ALT 34 08/20/2013 1504   ALKPHOS 83 04/04/2015 1334   ALKPHOS 111 08/20/2013 1504   BILITOT 0.4 04/04/2015 1334   BILITOT 0.3 08/20/2013 1504   GFRNONAA >60 04/04/2015 1334   GFRNONAA 58 (L) 08/20/2013 1504   GFRAA >60 04/04/2015 1334   GFRAA >60 08/20/2013 1504    No results found for: SPEP, UPEP  Lab Results  Component Value Date   WBC 8.1 04/04/2015   NEUTROABS 6.2 04/04/2015   HGB 15.9 04/04/2015   HCT 46.6 04/04/2015   MCV 94.5 04/04/2015   PLT 209 04/04/2015      Chemistry      Component Value Date/Time   NA 137 04/04/2015 1334   NA 141 08/20/2013 1504   K 3.9 04/04/2015 1334   K 4.1 08/20/2013 1504   CL 103 04/04/2015 1334   CL 105 08/20/2013 1504   CO2 29 04/04/2015 1334   CO2 29 08/20/2013 1504   BUN 21 (H) 04/04/2015 1334   BUN 29 (H) 08/20/2013 1504   CREATININE 1.02 04/04/2015 1334   CREATININE 1.32 (H) 08/20/2013 1504      Component Value Date/Time   CALCIUM 9.0 04/04/2015 1334   CALCIUM 8.8 08/20/2013 1504   ALKPHOS 83 04/04/2015 1334   ALKPHOS 111 08/20/2013 1504   AST 28 04/04/2015 1334   AST 29 08/20/2013 1504   ALT 18 04/04/2015 1334   ALT 34 08/20/2013 1504   BILITOT 0.4 04/04/2015 1334   BILITOT 0.3 08/20/2013 1504       RADIOGRAPHIC STUDIES: I have personally reviewed the radiological images as listed and agreed with the findings in the report. No results found.   ASSESSMENT & PLAN:  Cancer of head and neck ([facility]) Squamous cell cancer of head and neck - likely head and neck primary status post chemoradiation-2001 Clinically no  recurrence recurrence.  # Tobacco abuse- not interested in quitting smoking. Consult to quit smoking. Under low-dose CT protocol for lung cancer screening.  # Follow-up in one year CBC CMP TSH  1. 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  In February of 2001 2. Continuing tobacco abuse Orders Placed This Encounter  Procedures  .  CBC with Differential    Standing Status:   Future    Standing Expiration Date:   10/08/2016  . Comprehensive metabolic panel    Standing Status:   Future    Standing Expiration Date:   10/08/2016  . TSH    Standing Status:   Future    Standing Expiration Date:   10/08/2016   All questions were answered. The patient knows to call the clinic with any problems, questions or concerns.      Cammie Sickle, MD 10/09/2015 4:48 PM

## 2016-01-20 DIAGNOSIS — R918 Other nonspecific abnormal finding of lung field: Secondary | ICD-10-CM | POA: Diagnosis not present

## 2016-01-20 DIAGNOSIS — R05 Cough: Secondary | ICD-10-CM | POA: Diagnosis not present

## 2016-01-20 DIAGNOSIS — J209 Acute bronchitis, unspecified: Secondary | ICD-10-CM | POA: Diagnosis not present

## 2016-02-03 DIAGNOSIS — R05 Cough: Secondary | ICD-10-CM | POA: Diagnosis not present

## 2016-02-03 DIAGNOSIS — R918 Other nonspecific abnormal finding of lung field: Secondary | ICD-10-CM | POA: Diagnosis not present

## 2016-02-13 ENCOUNTER — Inpatient Hospital Stay: Payer: BLUE CROSS/BLUE SHIELD | Attending: Internal Medicine

## 2016-02-13 ENCOUNTER — Inpatient Hospital Stay (HOSPITAL_BASED_OUTPATIENT_CLINIC_OR_DEPARTMENT_OTHER): Payer: BLUE CROSS/BLUE SHIELD | Admitting: Oncology

## 2016-02-13 ENCOUNTER — Encounter: Payer: Self-pay | Admitting: Oncology

## 2016-02-13 ENCOUNTER — Other Ambulatory Visit: Payer: Self-pay

## 2016-02-13 ENCOUNTER — Other Ambulatory Visit: Payer: Self-pay | Admitting: *Deleted

## 2016-02-13 VITALS — BP 141/84 | HR 80 | Temp 95.5°F | Resp 18 | Wt 194.7 lb

## 2016-02-13 DIAGNOSIS — Z79899 Other long term (current) drug therapy: Secondary | ICD-10-CM | POA: Insufficient documentation

## 2016-02-13 DIAGNOSIS — I2510 Atherosclerotic heart disease of native coronary artery without angina pectoris: Secondary | ICD-10-CM | POA: Diagnosis not present

## 2016-02-13 DIAGNOSIS — Z87891 Personal history of nicotine dependence: Secondary | ICD-10-CM | POA: Insufficient documentation

## 2016-02-13 DIAGNOSIS — F17210 Nicotine dependence, cigarettes, uncomplicated: Secondary | ICD-10-CM | POA: Diagnosis not present

## 2016-02-13 DIAGNOSIS — R918 Other nonspecific abnormal finding of lung field: Secondary | ICD-10-CM

## 2016-02-13 DIAGNOSIS — N200 Calculus of kidney: Secondary | ICD-10-CM | POA: Diagnosis not present

## 2016-02-13 DIAGNOSIS — C760 Malignant neoplasm of head, face and neck: Secondary | ICD-10-CM

## 2016-02-13 DIAGNOSIS — K8020 Calculus of gallbladder without cholecystitis without obstruction: Secondary | ICD-10-CM | POA: Insufficient documentation

## 2016-02-13 DIAGNOSIS — Z859 Personal history of malignant neoplasm, unspecified: Secondary | ICD-10-CM

## 2016-02-13 DIAGNOSIS — K5790 Diverticulosis of intestine, part unspecified, without perforation or abscess without bleeding: Secondary | ICD-10-CM | POA: Insufficient documentation

## 2016-02-13 DIAGNOSIS — R634 Abnormal weight loss: Secondary | ICD-10-CM | POA: Diagnosis not present

## 2016-02-13 DIAGNOSIS — Z803 Family history of malignant neoplasm of breast: Secondary | ICD-10-CM | POA: Diagnosis not present

## 2016-02-13 DIAGNOSIS — I714 Abdominal aortic aneurysm, without rupture: Secondary | ICD-10-CM | POA: Diagnosis not present

## 2016-02-13 LAB — CBC WITH DIFFERENTIAL/PLATELET
Basophils Absolute: 0 10*3/uL (ref 0–0.1)
Basophils Relative: 1 %
Eosinophils Absolute: 0.2 10*3/uL (ref 0–0.7)
Eosinophils Relative: 2 %
HEMATOCRIT: 43.8 % (ref 40.0–52.0)
Hemoglobin: 14.8 g/dL (ref 13.0–18.0)
LYMPHS ABS: 1.5 10*3/uL (ref 1.0–3.6)
LYMPHS PCT: 22 %
MCH: 31 pg (ref 26.0–34.0)
MCHC: 33.7 g/dL (ref 32.0–36.0)
MCV: 91.9 fL (ref 80.0–100.0)
MONO ABS: 0.4 10*3/uL (ref 0.2–1.0)
Monocytes Relative: 6 %
NEUTROS ABS: 4.8 10*3/uL (ref 1.4–6.5)
Neutrophils Relative %: 69 %
Platelets: 198 10*3/uL (ref 150–440)
RBC: 4.77 MIL/uL (ref 4.40–5.90)
RDW: 13.1 % (ref 11.5–14.5)
WBC: 7 10*3/uL (ref 3.8–10.6)

## 2016-02-13 LAB — COMPREHENSIVE METABOLIC PANEL
ALT: 16 U/L — AB (ref 17–63)
AST: 28 U/L (ref 15–41)
Albumin: 4 g/dL (ref 3.5–5.0)
Alkaline Phosphatase: 74 U/L (ref 38–126)
Anion gap: 7 (ref 5–15)
BILIRUBIN TOTAL: 0.6 mg/dL (ref 0.3–1.2)
BUN: 22 mg/dL — AB (ref 6–20)
CO2: 27 mmol/L (ref 22–32)
CREATININE: 1.12 mg/dL (ref 0.61–1.24)
Calcium: 9.2 mg/dL (ref 8.9–10.3)
Chloride: 103 mmol/L (ref 101–111)
Glucose, Bld: 115 mg/dL — ABNORMAL HIGH (ref 65–99)
Potassium: 4 mmol/L (ref 3.5–5.1)
Sodium: 137 mmol/L (ref 135–145)
TOTAL PROTEIN: 6.9 g/dL (ref 6.5–8.1)

## 2016-02-13 LAB — TSH: TSH: 2.321 u[IU]/mL (ref 0.350–4.500)

## 2016-02-13 NOTE — Progress Notes (Signed)
Per pt sister made appt for him after he had CT scan of chest @ [facility] and came today to compare CT scan w old results. Pro active appointment.  No voiced c/o

## 2016-02-13 NOTE — Addendum Note (Signed)
Addended by: Luella Cook on: 02/13/2016 03:24 PM   Modules accepted: Orders

## 2016-02-13 NOTE — Progress Notes (Signed)
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  In February of 2001;    2. Continuing tobacco abuse/ LDCT- May 2017-NED.      Cancer of head and neck ([place])   10/09/2015 Initial Diagnosis    Cancer of head and neck ([place])      Given his continued history of smoking, low-dose screening CT chest was ordered in May 2017 which did not reveal any evidence of suspicious nodules or malignancy.  Patient had a recent CT chest on 02/03/2016 which showed an irregular nodular opacity in the right lower lobe. While atypical infection could have this appearance it raises concern for primary pulmonary neoplasm. This nodule measures up to 3.3 cm. No other pulmonary nodules. No lymphadenopathy by size criteria. subtle nodularity of the left adrenal gland 0.8 cm  Interval history- Patient had symptoms of cough and fever concerning for pneumonia about 2 weeks ago and underwent CXR which showed some concerning findings in the RLL. This was followed by CT thorax at [place] showed 3.3cm nodule in the RLL and subtle nodularity of left adrenal gland 0.8 cm. He is here to discuss the same. He continues to smoke 2 packs per day. Reports some fatigue and 5 pound weight loss over 6 months. He has completed antibiotic course for his pneumonia  ECOG PS- 1 Pain scale- 0 Opioid associated constipation-  no  Review of systems- Review of Systems  Constitutional: Positive for malaise/fatigue and weight loss. Negative for chills and fever.  HENT: Negative for congestion, ear discharge and nosebleeds.   Eyes: Negative for blurred vision.  Respiratory: Negative for cough, hemoptysis, sputum production, shortness of breath and wheezing.   Cardiovascular: Negative for chest pain, palpitations, orthopnea and claudication.  Gastrointestinal: Negative for abdominal pain, blood in stool, constipation, diarrhea, heartburn, melena, nausea and vomiting.  Genitourinary: Negative for dysuria, flank pain, frequency, hematuria and urgency.  Musculoskeletal: Negative for back pain, joint pain and myalgias.  Skin: Negative for rash.  Neurological: Negative for dizziness, tingling, focal weakness, seizures, weakness and headaches.  Endo/Heme/Allergies: Does not bruise/bleed easily.  Psychiatric/Behavioral: Negative for depression and suicidal ideas. The patient does not have insomnia.      Current treatment- observation  No Known Allergies   Past Medical History:  Diagnosis Date  . Carcinoma metastatic to lymph node ([place])   . Hoarseness of voice   . Personal history of tobacco use, presenting hazards to health February of 2001     Past Surgical History:  Procedure Laterality Date  . TONSILLECTOMY      Social History   Social History  . Marital status: Single    Spouse name: N/A  . Number of children: N/A  . Years of education: N/A   Occupational History  . Not on file.   Social History Main Topics  . Smoking status: Current Every Day Smoker    Packs/day: 0.25    Years: 40.00  Types: Cigarettes  . Smokeless tobacco: Never Used  . Alcohol use No  . Drug use: No  . Sexual activity: Not on file   Other Topics Concern  . Not on file   Social History Narrative  . No narrative on file    Family History  Problem Relation Age of Onset  . Breast cancer Mother   . Heart attack  Father      Current Outpatient Prescriptions:  .  ibuprofen (ADVIL,MOTRIN) 800 MG tablet, TAKE 1 TABLET BY MOUTH EVERY 6 HOURS AS NEEDED, Disp: , Rfl:  .  levothyroxine (SYNTHROID, LEVOTHROID) 88 MCG tablet, TAKE 1 TABLET BY MOUTH ONCE A DAY ON AN EMPTY STOMACH., Disp: , Rfl:  .  meloxicam (MOBIC) 15 MG tablet, TK 1 T PO QD, Disp: , Rfl: 5 .  nicotine (RA NICOTINE) 21 mg/24hr patch, Place onto the skin., Disp: , Rfl:  .  omeprazole (PRILOSEC) 20 MG capsule, TAKE ONE CAPSULE BY MOUTH EVERY DAY FOR HEART BURN, Disp: , Rfl:  .  simvastatin (ZOCOR) 20 MG tablet, Take by mouth., Disp: , Rfl:   Physical exam:  Vitals:   02/13/16 1342  BP: (!) 141/84  Pulse: 80  Resp: 18  Temp: (!) 95.5 F (35.3 C)  TempSrc: Tympanic  Weight: 194 lb 10.7 oz (88.3 kg)   Physical Exam  Constitutional: He is oriented to person, place, and time and well-developed, well-nourished, and in no distress.  HENT:  Head: Normocephalic and atraumatic.  Eyes: EOM are normal. Pupils are equal, round, and reactive to light.  Neck: Normal range of motion.  Cardiovascular: Normal rate, regular rhythm and normal heart sounds.   Pulmonary/Chest: Effort normal and breath sounds normal.  Abdominal: Soft. Bowel sounds are normal.  Neurological: He is alert and oriented to person, place, and time.  Skin: Skin is warm and dry.  Skin overright neck indurated from prior radiation     CMP Latest Ref Rng & Units 04/04/2015  Glucose 65 - 99 mg/dL 164(H)  BUN 6 - 20 mg/dL 21(H)  Creatinine 0.61 - 1.24 mg/dL 1.02  Sodium 135 - 145 mmol/L 137  Potassium 3.5 - 5.1 mmol/L 3.9  Chloride 101 - 111 mmol/L 103  CO2 22 - 32 mmol/L 29  Calcium 8.9 - 10.3 mg/dL 9.0  Total Protein 6.5 - 8.1 g/dL 7.2  Total Bilirubin 0.3 - 1.2 mg/dL 0.4  Alkaline Phos 38 - 126 U/L 83  AST 15 - 41 U/L 28  ALT 17 - 63 U/L 18   CBC Latest Ref Rng & Units 04/04/2015  WBC 3.8 - 10.6 K/uL 8.1  Hemoglobin 13.0 - 18.0 g/dL 15.9  Hematocrit 40.0 - 52.0 %  46.6  Platelets 150 - 440 K/uL 209    CT CHEST AT [facility]:  Irregular, nodular opacity in right lower lobe. While atypical infection could have this appearance (particularly given patent adjacent airways), raises concern for primary pulmonary neoplasm. PET/CT and/or tissue sampling could be considered for better characterization.  -Questionable left adrenal nodule. Given findings in the chest, recommend better characterization with PET/CT, MRI with/without contrast, or adrenal mass protocol CT.  -No lymphadenopathy or other evidence of distant metastases in the chest.  Result Narrative  EXAM: CT Chest without contrast  DATE: 02/03/2016 3:04 PM ACCESSION: [id number] UN DICTATED:02/03/2016 3:11 PM  INTERPRETATION LOCATION: [place]  CLINICAL INDICATION: 66 years old Male with Abnormal CXR-R91.8-Mass of lower lobe of right lung  COMPARISON: None.  TECHNIQUE: A spiral CT scan was obtained without  IV contrast from the thoracic inlet through the hemidiaphragms. Images were reconstructed in the axial plane.Coronal and sagittal reformatted images of the chest were also provided for further evaluation of the lung parenchyma.   FINDINGS:   AIRWAYS, LUNGS, PLEURA:   Mild diffuse bronchial wall thickening. Minimal biapical pleural-parenchymal thickening.   Nodular opacity with irregular margins and right lower lobe, up to 3.3 cm (4:71), with patent airways stenting through the inferior margin (4:74). No other pulmonary nodules.   No pleural effusion. No pneumothoraces.  MEDIASTINUM:  Aortic and coronary artery calcifications. Heart and great vessels otherwise within normal limits in size and unenhanced appearance. No pericardial effusion.   No lymphadenopathy by size criteria.  IMAGED ABDOMEN: Subtle nodularity left adrenal gland, approximately 0.8 cm, (4:142).  SOFT TISSUES: Unremarkable.  BONES: No suspicious osseous lesions.       Assessment and plan- Patient is a 66 y.o. male  who is at risk of the results of his recent CT chest from January 2018 which showed a new right lower lung nodule of 3.3 cm   1. I explained to the patient findings of recent chest. I will obtain PET/CT for further characterization of the RLL nodule as well as adrenal gland. I will discuss his case at Tumor board for further management and see him back in 2 weeks time to discuss PET/CT results and further management   Visit Diagnosis 1. Multiple lung nodules on CT   2. Personal history of tobacco use, presenting hazards to health      Dr. Randa Evens, MD, MPH [facility] at [facility] Pager- [phone] 02/13/2016 2:18 PM

## 2016-02-19 ENCOUNTER — Encounter
Admission: RE | Admit: 2016-02-19 | Discharge: 2016-02-19 | Disposition: A | Discharge status: Home / Self Care | Payer: BLUE CROSS/BLUE SHIELD | Source: Ambulatory Visit | Attending: Oncology | Admitting: Oncology

## 2016-02-19 DIAGNOSIS — R918 Other nonspecific abnormal finding of lung field: Secondary | ICD-10-CM | POA: Insufficient documentation

## 2016-02-19 DIAGNOSIS — C3431 Malignant neoplasm of lower lobe, right bronchus or lung: Secondary | ICD-10-CM | POA: Diagnosis not present

## 2016-02-19 DIAGNOSIS — Z87891 Personal history of nicotine dependence: Secondary | ICD-10-CM | POA: Diagnosis not present

## 2016-02-19 LAB — GLUCOSE, CAPILLARY: Glucose-Capillary: 114 mg/dL — ABNORMAL HIGH (ref 65–99)

## 2016-02-19 MED ORDER — FLUDEOXYGLUCOSE F - 18 (FDG) INJECTION
12.5200 | Freq: Once | INTRAVENOUS | Status: AC | PRN
  Administered 2016-02-19: 12.52 via INTRAVENOUS

## 2016-02-27 ENCOUNTER — Inpatient Hospital Stay (HOSPITAL_BASED_OUTPATIENT_CLINIC_OR_DEPARTMENT_OTHER): Payer: BLUE CROSS/BLUE SHIELD | Admitting: Oncology

## 2016-02-27 ENCOUNTER — Encounter: Payer: Self-pay | Admitting: Oncology

## 2016-02-27 VITALS — BP 91/65 | HR 84 | Temp 98.3°F | Wt 195.1 lb

## 2016-02-27 DIAGNOSIS — Z803 Family history of malignant neoplasm of breast: Secondary | ICD-10-CM | POA: Diagnosis not present

## 2016-02-27 DIAGNOSIS — I2510 Atherosclerotic heart disease of native coronary artery without angina pectoris: Secondary | ICD-10-CM

## 2016-02-27 DIAGNOSIS — R918 Other nonspecific abnormal finding of lung field: Secondary | ICD-10-CM | POA: Diagnosis not present

## 2016-02-27 DIAGNOSIS — I714 Abdominal aortic aneurysm, without rupture: Secondary | ICD-10-CM

## 2016-02-27 DIAGNOSIS — N200 Calculus of kidney: Secondary | ICD-10-CM

## 2016-02-27 DIAGNOSIS — Z859 Personal history of malignant neoplasm, unspecified: Secondary | ICD-10-CM | POA: Diagnosis not present

## 2016-02-27 DIAGNOSIS — F17200 Nicotine dependence, unspecified, uncomplicated: Secondary | ICD-10-CM

## 2016-02-27 DIAGNOSIS — K5790 Diverticulosis of intestine, part unspecified, without perforation or abscess without bleeding: Secondary | ICD-10-CM | POA: Diagnosis not present

## 2016-02-27 DIAGNOSIS — K8020 Calculus of gallbladder without cholecystitis without obstruction: Secondary | ICD-10-CM | POA: Diagnosis not present

## 2016-02-27 DIAGNOSIS — R634 Abnormal weight loss: Secondary | ICD-10-CM

## 2016-02-27 DIAGNOSIS — Z79899 Other long term (current) drug therapy: Secondary | ICD-10-CM | POA: Diagnosis not present

## 2016-02-27 DIAGNOSIS — Z87891 Personal history of nicotine dependence: Secondary | ICD-10-CM

## 2016-02-27 DIAGNOSIS — F17210 Nicotine dependence, cigarettes, uncomplicated: Secondary | ICD-10-CM | POA: Diagnosis not present

## 2016-02-27 DIAGNOSIS — R9389 Abnormal findings on diagnostic imaging of other specified body structures: Secondary | ICD-10-CM

## 2016-02-27 MED ORDER — VARENICLINE TARTRATE 1 MG PO TABS
1.0000 mg | ORAL_TABLET | Freq: Two times a day (BID) | ORAL | 0 refills | Status: DC
Start: 2016-02-27 — End: 2017-07-01

## 2016-02-27 NOTE — Progress Notes (Signed)
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  In February of 2001;    2. Continuing tobacco abuse/ LDCT- May 2017-NED.      Cancer of head and neck ([place])   10/09/2015 Initial Diagnosis    Cancer of head and neck ([place])     Given his continued history of smoking, low-dose screening CT chest was ordered in May 2017 which did not reveal any evidence of suspicious nodules or malignancy.  Patient had a recent CT chest on 02/03/2016 which showed an irregular nodular opacity in the right lower lobe. While atypical infection could have this appearance it raises concern for primary pulmonary neoplasm. This nodule measures up to 3.3 cm. No other pulmonary nodules. No lymphadenopathy by size criteria. subtle nodularity of the left adrenal gland 0.8 cm  IMPRESSION: New 2.4 cm spiculated nodule in central right lower lobe shows mild FDG uptake, highly suspicious for primary bronchogenic adenocarcinoma.  No evidence of thoracic nodal or distant metastatic disease.  3.5 cm infrarenal abdominal aortic aneurysm. Recommend follow up by [place] in 2 years. This recommendation follows ACR consensus guidelines: White Paper of the ACR Incidental Findings Committee II on Vascular Findings. Joellyn Rued [id number].  Other incidental findings including aortic and coronary  artery atherosclerosis, cholelithiasis, nephrolithiasis, and colonic diverticulosis.    Interval history- patient is continuing to smoke 2 packs of cigarettes per day but is interested in quitting  ECOG PS- 1 Pain scale- 0   Review of systems- Review of Systems  Constitutional: Positive for malaise/fatigue. Negative for chills, fever and weight loss.  HENT: Negative for congestion, ear discharge and nosebleeds.   Eyes: Negative for blurred vision.  Respiratory: Negative for cough, hemoptysis, sputum production, shortness of breath and wheezing.   Cardiovascular: Negative for chest pain, palpitations, orthopnea and claudication.  Gastrointestinal: Negative for abdominal pain, blood in stool, constipation, diarrhea, heartburn, melena, nausea and vomiting.  Genitourinary: Negative for dysuria, flank pain, frequency, hematuria and urgency.  Musculoskeletal: Negative for back pain, joint pain and myalgias.  Skin: Negative for rash.  Neurological: Negative for dizziness, tingling, focal weakness, seizures, weakness and headaches.  Endo/Heme/Allergies: Does not bruise/bleed easily.  Psychiatric/Behavioral: Negative for depression and suicidal ideas. The patient does not have insomnia.      Current treatment- observation  No Known Allergies   Past Medical History:  Diagnosis Date  . Carcinoma metastatic to lymph node ([place])   . Hoarseness of voice   . Personal history of tobacco use, presenting hazards to health February of 2001     Past Surgical History:  Procedure Laterality Date  . TONSILLECTOMY      Social History   Social History  . Marital status: Single    Spouse name: N/A  . Number of children: N/A  . Years of education: N/A   Occupational History  . Not on file.   Social History Main Topics  . Smoking status: Current  Every Day Smoker    Packs/day: 0.25    Years: 40.00    Types: Cigarettes  . Smokeless tobacco: Never Used  . Alcohol use No  . Drug use:  No  . Sexual activity: Not on file   Other Topics Concern  . Not on file   Social History Narrative  . No narrative on file    Family History  Problem Relation Age of Onset  . Breast cancer Mother   . Heart attack Father      Current Outpatient Prescriptions:  .  ALPRAZolam (XANAX) 0.25 MG tablet, Take 0.25 mg by mouth., Disp: , Rfl:  .  levothyroxine (SYNTHROID, LEVOTHROID) 88 MCG tablet, TAKE 1 TABLET BY MOUTH ONCE A DAY ON AN EMPTY STOMACH., Disp: , Rfl:  .  meloxicam (MOBIC) 15 MG tablet, TK 1 T PO QD, Disp: , Rfl: 5 .  omeprazole (PRILOSEC) 20 MG capsule, TAKE ONE CAPSULE BY MOUTH EVERY DAY FOR HEART BURN, Disp: , Rfl:  .  ibuprofen (ADVIL,MOTRIN) 800 MG tablet, TAKE 1 TABLET BY MOUTH EVERY 6 HOURS AS NEEDED, Disp: , Rfl:  .  nicotine (RA NICOTINE) 21 mg/24hr patch, Place onto the skin., Disp: , Rfl:  .  simvastatin (ZOCOR) 20 MG tablet, Take by mouth., Disp: , Rfl:  .  varenicline (CHANTIX CONTINUING MONTH PAK) 1 MG tablet, Take 1 tablet (1 mg total) by mouth 2 (two) times daily. Day 1-3 take 1/2 tablet daily, Day 4 -7 take 1/2 tablet twice daily, then Day 8 onwards take 1 tablet twice daily, Disp: 66 tablet, Rfl: 0  Physical exam:  Vitals:   02/27/16 1322  BP: 91/65  Pulse: 84  Temp: 98.3 F (36.8 C)  TempSrc: Tympanic  Weight: 195 lb 1.7 oz (88.5 kg)   Physical Exam  Constitutional: He is oriented to person, place, and time and well-developed, well-nourished, and in no distress.  HENT:  Head: Normocephalic and atraumatic.  Eyes: EOM are normal. Pupils are equal, round, and reactive to light.  Neck: Normal range of motion.  Cardiovascular: Normal rate, regular rhythm and normal heart sounds.   Pulmonary/Chest: Effort normal and breath sounds normal.  Abdominal: Soft. Bowel sounds are normal.  Neurological: He is alert and oriented to person, place, and time.  Skin: Skin is warm and dry.     CMP Latest Ref Rng & Units 02/13/2016  Glucose 65 - 99 mg/dL 115(H)   BUN 6 - 20 mg/dL 22(H)  Creatinine 0.61 - 1.24 mg/dL 1.12  Sodium 135 - 145 mmol/L 137  Potassium 3.5 - 5.1 mmol/L 4.0  Chloride 101 - 111 mmol/L 103  CO2 22 - 32 mmol/L 27  Calcium 8.9 - 10.3 mg/dL 9.2  Total Protein 6.5 - 8.1 g/dL 6.9  Total Bilirubin 0.3 - 1.2 mg/dL 0.6  Alkaline Phos 38 - 126 U/L 74  AST 15 - 41 U/L 28  ALT 17 - 63 U/L 16(L)   CBC Latest Ref Rng & Units 02/13/2016  WBC 3.8 - 10.6 K/uL 7.0  Hemoglobin 13.0 - 18.0 g/dL 14.8  Hematocrit 40.0 - 52.0 % 43.8  Platelets 150 - 440 K/uL 198    No images are attached to the encounter.  Nm Pet Image Restag (ps) Skull Base To Thigh  Result Date: 02/19/2016 CLINICAL DATA:  Initial treatment strategy for right lower lobe pulmonary nodule. EXAM: NUCLEAR MEDICINE PET SKULL BASE TO THIGH TECHNIQUE: 12.5 mCi F-18 FDG was injected intravenously. Full-ring PET imaging was performed from the skull base  to thigh after the radiotracer. CT data was obtained and used for attenuation correction and anatomic localization. FASTING BLOOD GLUCOSE:  Value: 114 mg/dl COMPARISON:  Low-dose screening lung CT on 05/28/2015 FINDINGS: NECK No hypermetabolic lymph nodes or other soft tissue masses in the neck. CHEST No hypermetabolic mediastinal or hilar nodes. A new 2.4 cm spiculated nodule is seen within the central right lower lobe, which shows mild FDG uptake with SUV max of 2.6. This is highly suspicious for primary bronchogenic adenocarcinoma. No other suspicious pulmonary nodules or masses seen on CT. No evidence of pleural effusion. Aortic and coronary artery atherosclerosis. ABDOMEN/PELVIS No abnormal hypermetabolic activity within the liver, pancreas, adrenal glands, or spleen. No hypermetabolic lymph nodes in the abdomen or pelvis. Tiny calcified gallstone , without evidence of cholecystitis. Tiny nonobstructing bilateral renal calculi . Severe descending and sigmoid colon diverticulosis , without evidence of diverticulitis. 3.5 cm infrarenal  abdominal aortic aneurysm. SKELETON No focal hypermetabolic activity to suggest skeletal metastasis. IMPRESSION: New 2.4 cm spiculated nodule in central right lower lobe shows mild FDG uptake, highly suspicious for primary bronchogenic adenocarcinoma. No evidence of thoracic nodal or distant metastatic disease. 3.5 cm infrarenal abdominal aortic aneurysm. Recommend follow up by [place] in 2 years. This recommendation follows ACR consensus guidelines: White Paper of the ACR Incidental Findings Committee II on Vascular Findings. Joellyn Rued [id number]. Other incidental findings including aortic and coronary artery atherosclerosis, cholelithiasis, nephrolithiasis, and colonic diverticulosis. Electronically Signed   By: Earle Gell M.D.   On: 02/19/2016 09:26     Assessment and plan- Patient is a 66 y.o. male with a history of head and neck cancer in the past now presenting with right lower lobe pulmonary nodule  We discussed patient's case at the tumor board. We have compared to his recent PET/CT scan with the CT that he had at [facility]. The right lower lobe nodule appears a little smaller on the PET/CT as compared to CT that he had in January 2018. However given the persistence of this nodule on repeat CT scan was recommended in 6-8 weeks. I will be getting a repeat CT thorax with contrast in 8 weeks from now and discuss scans at the tumor board. I will see the patient back after his repeat CT thorax  Tobacco dependence-patient is not interested in going to the quit smart program but would like to try something to help him quit smoking. He had used Chantix in the past and had some side effects but is willing to give it a try again. I discussed the risks and benefits of Chantix including all but not limited to headache, insomnia, suicidal ideations. Patient understands and agrees to proceed. The plan is for him to start at 0.5 mg once a day for 3 days followed by 0.5 mg twice a day for days 427 followed by 1 mg  twice a day starting day 8. He will try to cut down his smoking by 50% at week 4 with aim to completely stop smoking by week 12    Visit Diagnosis 1. Right lower lobe lung mass   2. Abnormal CT scan   3. Smoker   4. Tobacco dependence      Dr. Randa Evens, MD, MPH [facility] Pager- [phone] 02/27/2016 2:53 PM

## 2016-02-27 NOTE — Progress Notes (Signed)
Manual BP 95/56 in right arm, sitting.  Patient denies pain or discomfort at this time.  Patient brought to exam room 5, accompanied by his wife.

## 2016-03-04 ENCOUNTER — Inpatient Hospital Stay: Payer: BLUE CROSS/BLUE SHIELD

## 2016-04-12 ENCOUNTER — Ambulatory Visit
Admission: RE | Admit: 2016-04-12 | Discharge: 2016-04-12 | Disposition: A | Discharge status: Home / Self Care | Payer: BLUE CROSS/BLUE SHIELD | Source: Ambulatory Visit | Attending: Oncology | Admitting: Oncology

## 2016-04-12 DIAGNOSIS — K8020 Calculus of gallbladder without cholecystitis without obstruction: Secondary | ICD-10-CM | POA: Diagnosis not present

## 2016-04-12 DIAGNOSIS — F17200 Nicotine dependence, unspecified, uncomplicated: Secondary | ICD-10-CM | POA: Insufficient documentation

## 2016-04-12 DIAGNOSIS — E278 Other specified disorders of adrenal gland: Secondary | ICD-10-CM | POA: Diagnosis not present

## 2016-04-12 DIAGNOSIS — R9389 Abnormal findings on diagnostic imaging of other specified body structures: Secondary | ICD-10-CM

## 2016-04-12 DIAGNOSIS — R938 Abnormal findings on diagnostic imaging of other specified body structures: Secondary | ICD-10-CM | POA: Diagnosis not present

## 2016-04-12 DIAGNOSIS — R918 Other nonspecific abnormal finding of lung field: Secondary | ICD-10-CM | POA: Diagnosis not present

## 2016-04-12 DIAGNOSIS — I2510 Atherosclerotic heart disease of native coronary artery without angina pectoris: Secondary | ICD-10-CM | POA: Insufficient documentation

## 2016-04-12 LAB — POCT I-STAT CREATININE: CREATININE: 1.1 mg/dL (ref 0.61–1.24)

## 2016-04-12 MED ORDER — IOPAMIDOL (ISOVUE-300) INJECTION 61%
75.0000 mL | Freq: Once | INTRAVENOUS | Status: AC | PRN
  Administered 2016-04-12: 75 mL via INTRAVENOUS

## 2016-04-15 ENCOUNTER — Inpatient Hospital Stay: Payer: BLUE CROSS/BLUE SHIELD | Attending: Oncology

## 2016-04-15 DIAGNOSIS — K449 Diaphragmatic hernia without obstruction or gangrene: Secondary | ICD-10-CM | POA: Insufficient documentation

## 2016-04-15 DIAGNOSIS — K8020 Calculus of gallbladder without cholecystitis without obstruction: Secondary | ICD-10-CM | POA: Insufficient documentation

## 2016-04-15 DIAGNOSIS — N200 Calculus of kidney: Secondary | ICD-10-CM | POA: Insufficient documentation

## 2016-04-15 DIAGNOSIS — Z9221 Personal history of antineoplastic chemotherapy: Secondary | ICD-10-CM | POA: Insufficient documentation

## 2016-04-15 DIAGNOSIS — Z8589 Personal history of malignant neoplasm of other organs and systems: Secondary | ICD-10-CM | POA: Insufficient documentation

## 2016-04-15 DIAGNOSIS — K5730 Diverticulosis of large intestine without perforation or abscess without bleeding: Secondary | ICD-10-CM | POA: Insufficient documentation

## 2016-04-15 DIAGNOSIS — Z923 Personal history of irradiation: Secondary | ICD-10-CM | POA: Insufficient documentation

## 2016-04-15 DIAGNOSIS — F17210 Nicotine dependence, cigarettes, uncomplicated: Secondary | ICD-10-CM | POA: Insufficient documentation

## 2016-04-15 DIAGNOSIS — Z79899 Other long term (current) drug therapy: Secondary | ICD-10-CM | POA: Insufficient documentation

## 2016-04-15 DIAGNOSIS — I700 Atherosclerosis of aorta: Secondary | ICD-10-CM | POA: Insufficient documentation

## 2016-04-15 DIAGNOSIS — Z803 Family history of malignant neoplasm of breast: Secondary | ICD-10-CM | POA: Insufficient documentation

## 2016-04-15 DIAGNOSIS — I714 Abdominal aortic aneurysm, without rupture: Secondary | ICD-10-CM | POA: Insufficient documentation

## 2016-04-15 DIAGNOSIS — R911 Solitary pulmonary nodule: Secondary | ICD-10-CM | POA: Insufficient documentation

## 2016-04-16 ENCOUNTER — Inpatient Hospital Stay (HOSPITAL_BASED_OUTPATIENT_CLINIC_OR_DEPARTMENT_OTHER): Payer: BLUE CROSS/BLUE SHIELD | Admitting: Oncology

## 2016-04-16 ENCOUNTER — Telehealth: Payer: Self-pay | Admitting: *Deleted

## 2016-04-16 VITALS — BP 131/84 | HR 96 | Temp 95.8°F | Resp 18 | Wt 199.4 lb

## 2016-04-16 DIAGNOSIS — Z79899 Other long term (current) drug therapy: Secondary | ICD-10-CM

## 2016-04-16 DIAGNOSIS — K5730 Diverticulosis of large intestine without perforation or abscess without bleeding: Secondary | ICD-10-CM

## 2016-04-16 DIAGNOSIS — K8020 Calculus of gallbladder without cholecystitis without obstruction: Secondary | ICD-10-CM | POA: Diagnosis not present

## 2016-04-16 DIAGNOSIS — K449 Diaphragmatic hernia without obstruction or gangrene: Secondary | ICD-10-CM | POA: Diagnosis not present

## 2016-04-16 DIAGNOSIS — C760 Malignant neoplasm of head, face and neck: Secondary | ICD-10-CM

## 2016-04-16 DIAGNOSIS — Z923 Personal history of irradiation: Secondary | ICD-10-CM

## 2016-04-16 DIAGNOSIS — R911 Solitary pulmonary nodule: Secondary | ICD-10-CM | POA: Diagnosis not present

## 2016-04-16 DIAGNOSIS — I714 Abdominal aortic aneurysm, without rupture: Secondary | ICD-10-CM

## 2016-04-16 DIAGNOSIS — F17210 Nicotine dependence, cigarettes, uncomplicated: Secondary | ICD-10-CM

## 2016-04-16 DIAGNOSIS — Z803 Family history of malignant neoplasm of breast: Secondary | ICD-10-CM

## 2016-04-16 DIAGNOSIS — Z9221 Personal history of antineoplastic chemotherapy: Secondary | ICD-10-CM

## 2016-04-16 DIAGNOSIS — I700 Atherosclerosis of aorta: Secondary | ICD-10-CM | POA: Diagnosis not present

## 2016-04-16 DIAGNOSIS — R918 Other nonspecific abnormal finding of lung field: Secondary | ICD-10-CM

## 2016-04-16 DIAGNOSIS — N200 Calculus of kidney: Secondary | ICD-10-CM | POA: Diagnosis not present

## 2016-04-16 DIAGNOSIS — F17200 Nicotine dependence, unspecified, uncomplicated: Secondary | ICD-10-CM

## 2016-04-16 DIAGNOSIS — Z8589 Personal history of malignant neoplasm of other organs and systems: Secondary | ICD-10-CM | POA: Diagnosis not present

## 2016-04-16 NOTE — Telephone Encounter (Signed)
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

## 2016-04-16 NOTE — Progress Notes (Signed)
Here for f/u-states doing well

## 2016-04-17 ENCOUNTER — Encounter: Payer: Self-pay | Admitting: Oncology

## 2016-04-17 NOTE — Progress Notes (Signed)
Hematology/Oncology Consult note [facility]  Telephone:([phone] [phone]  Patient Care Team: Derinda Late, MD as PCP - General (Family Medicine)   Name of the patient: Luke [name]  [id number]  06-11-1950   Date of visit: 04/17/16  Diagnosis- lung nodule likely infectious  Chief complaint/ Reason for visit- discuss reslts of surveillance CT  Heme/Onc history:  Oncology History   1. 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  In February of 2001;    2. Continuing tobacco abuse/ LDCT- May 2017-NED.      Cancer of head and neck ([place])   10/09/2015 Initial Diagnosis    Cancer of head and neck ([place])      Given his continued history of smoking, low-dose screening CT chest was ordered in May 2017which did not reveal any evidence of suspicious nodules or malignancy.  Patient had a recent CT chest on 02/03/2016 which showed an irregular nodular opacity in the right lower lobe. While atypical infection could have this appearance it raises concern for primary pulmonary neoplasm. This nodule measures up to 3.3 cm. No other pulmonary nodules. No lymphadenopathy by size criteria. subtle nodularity of the left adrenal gland 0.8 cm  IMPRESSION: New 2.4 cm spiculated nodule in central right lower lobe shows mild FDG uptake, highly suspicious for primary bronchogenic adenocarcinoma.  No evidence of thoracic nodal or distant metastatic disease.  3.5 cm infrarenal abdominal aortic aneurysm. Recommend follow up by [place] in 2 years. This recommendation follows ACR consensus guidelines: White Paper of the ACR Incidental Findings Committee II on Vascular Findings. Joellyn Rued [id number]; 56:389-373.  Other incidental findings including aortic and coronary artery atherosclerosis, cholelithiasis, nephrolithiasis, and  colonic diverticulosis.   Interval history- patient has cut down his smoking from 2 packs to 1 pack per day. He reports feeling nauseous form chantix and would like to try something else  Review of systems- Review of Systems  Constitutional: Negative for chills, fever, malaise/fatigue and weight loss.  HENT: Negative for congestion, ear discharge and nosebleeds.   Eyes: Negative for blurred vision.  Respiratory: Negative for cough, hemoptysis, sputum production, shortness of breath and wheezing.   Cardiovascular: Negative for chest pain, palpitations, orthopnea and claudication.  Gastrointestinal: Negative for abdominal pain, blood in stool, constipation, diarrhea, heartburn, melena, nausea and vomiting.  Genitourinary: Negative for dysuria, flank pain, frequency, hematuria and urgency.  Musculoskeletal: Negative for back pain, joint pain and myalgias.  Skin: Negative for rash.  Neurological: Negative for dizziness, tingling, focal weakness, seizures, weakness and headaches.  Endo/Heme/Allergies: Does not bruise/bleed easily.  Psychiatric/Behavioral: Negative for depression and suicidal ideas. The patient does not have insomnia.      Current treatment- observation  No Known Allergies   Past Medical History:  Diagnosis Date  . Carcinoma metastatic to lymph node ([place])   . Hoarseness of voice   . Personal history of tobacco use, presenting hazards to health February of 2001     Past Surgical History:  Procedure Laterality Date  . TONSILLECTOMY      Social History   Social History  . Marital status: Single    Spouse name: N/A  . Number of children: N/A  . Years of education: N/A   Occupational History  . Not on file.   Social History Main Topics  . Smoking status: Current Every Day Smoker    Packs/day: 0.25    Years:  40.00    Types: Cigarettes  . Smokeless tobacco: Never Used  . Alcohol use No  . Drug use: No  . Sexual activity: Not on file   Other Topics  Concern  . Not on file   Social History Narrative  . No narrative on file    Family History  Problem Relation Age of Onset  . Breast cancer Mother   . Heart attack Father      Current Outpatient Prescriptions:  .  ALPRAZolam (XANAX) 0.25 MG tablet, Take 0.25 mg by mouth., Disp: , Rfl:  .  ibuprofen (ADVIL,MOTRIN) 800 MG tablet, TAKE 1 TABLET BY MOUTH EVERY 6 HOURS AS NEEDED, Disp: , Rfl:  .  levothyroxine (SYNTHROID, LEVOTHROID) 88 MCG tablet, TAKE 1 TABLET BY MOUTH ONCE A DAY ON AN EMPTY STOMACH., Disp: , Rfl:  .  meloxicam (MOBIC) 15 MG tablet, TK 1 T PO QD, Disp: , Rfl: 5 .  omeprazole (PRILOSEC) 20 MG capsule, TAKE ONE CAPSULE BY MOUTH EVERY DAY FOR HEART BURN, Disp: , Rfl:  .  varenicline (CHANTIX CONTINUING MONTH PAK) 1 MG tablet, Take 1 tablet (1 mg total) by mouth 2 (two) times daily. Day 1-3 take 1/2 tablet daily, Day 4 -7 take 1/2 tablet twice daily, then Day 8 onwards take 1 tablet twice daily, Disp: 66 tablet, Rfl: 0 .  simvastatin (ZOCOR) 20 MG tablet, Take by mouth., Disp: , Rfl:   Physical exam:  Vitals:   04/16/16 1326  BP: 131/84  Pulse: 96  Resp: 18  Temp: (!) 95.8 F (35.4 C)  TempSrc: Tympanic  Weight: 199 lb 6.4 oz (90.4 kg)   Physical Exam  Constitutional: He is oriented to person, place, and time and well-developed, well-nourished, and in no distress.  HENT:  Head: Normocephalic and atraumatic.  Eyes: EOM are normal. Pupils are equal, round, and reactive to light.  Neck: Normal range of motion.  Cardiovascular: Normal rate, regular rhythm and normal heart sounds.   Pulmonary/Chest: Effort normal and breath sounds normal.  Abdominal: Soft. Bowel sounds are normal.  Neurological: He is alert and oriented to person, place, and time.  Skin: Skin is warm and dry.     CMP Latest Ref Rng & Units 04/12/2016  Glucose 65 - 99 mg/dL -  BUN 6 - 20 mg/dL -  Creatinine 0.61 - 1.24 mg/dL 1.10  Sodium 135 - 145 mmol/L -  Potassium 3.5 - 5.1 mmol/L -  Chloride  101 - 111 mmol/L -  CO2 22 - 32 mmol/L -  Calcium 8.9 - 10.3 mg/dL -  Total Protein 6.5 - 8.1 g/dL -  Total Bilirubin 0.3 - 1.2 mg/dL -  Alkaline Phos 38 - 126 U/L -  AST 15 - 41 U/L -  ALT 17 - 63 U/L -   CBC Latest Ref Rng & Units 02/13/2016  WBC 3.8 - 10.6 K/uL 7.0  Hemoglobin 13.0 - 18.0 g/dL 14.8  Hematocrit 40.0 - 52.0 % 43.8  Platelets 150 - 440 K/uL 198    No images are attached to the encounter.  Ct Chest W Contrast  Result Date: 04/12/2016 CLINICAL DATA:  Right lower lobe mass EXAM: CT CHEST WITH CONTRAST TECHNIQUE: Multidetector CT imaging of the chest was performed during intravenous contrast administration. CONTRAST:  72mL ISOVUE-300 IOPAMIDOL (ISOVUE-300) INJECTION 61% COMPARISON:  PET-CT February 19, 2016 FINDINGS: Cardiovascular: Ascending thoracic aortic diameter measures 4.2 x 4.0 cm, measured on axial slice 62 series 2. There is no thoracic aortic dissection. Visualized great vessels  appear unremarkable. There are scattered foci of atherosclerotic calcification in the aorta. There are foci of coronary artery calcification. Pericardium is not appreciably thickened. No major vessel pulmonary embolus evident. Mediastinum/Nodes: Thyroid appears unremarkable. There are scattered subcentimeter mediastinal lymph nodes. In the sub- carinal region, there is a lymph node measuring 1.5 x 0.8 cm. This lymph node is stable compared to recent PET study. There is a small hiatal hernia. Lungs/Pleura: There is mild scarring in the apices. There has been near complete clearing of opacity from the right lower lobe in the area of previous spiculation. A small amount of residual atelectatic change remains in this area. No masslike area remains in the right lower lobe. There is no new parenchymal lung opacity elsewhere. No pleural thickening or pleural effusion is evident. Upper Abdomen: There is mild bilateral adrenal hypertrophy. There is cholelithiasis. Visualized upper abdominal structures otherwise  appear unremarkable. Musculoskeletal: There are areas of degenerative change in the thoracic spine. No blastic or lytic bone lesions. IMPRESSION: Near complete clearing of irregular opacity in the right lower lobe consistent with resolving pneumonia. No parenchymal lung mass evident. No adenopathy. The sub- carinal lymph node described above is unchanged and did not show abnormal metabolic activity on the recent PET study. Prominence of the ascending thoracic aorta noted with a measured value of 4.2 x 4.0 cm. Recommend annual imaging followup by CTA or MRA. This recommendation follows 2010 ACCF/AHA/AATS/ACR/ASA/SCA/SCAI/SIR/STS/SVM Guidelines for the Diagnosis and Management of Patients with Thoracic Aortic Disease. Circulation. 2010; 121: Y706-C376. No dissection. There are foci of coronary artery calcification noted. Cholelithiasis without gallbladder wall thickening. Mild adrenal hypertrophy bilaterally. Electronically Signed   By: Lowella Grip III M.D.   On: 04/12/2016 08:54     Assessment and plan- Patient is a 66 y.o. male with prior h/o head and neck cancer in 2001 recently found to have lung nodule  1. Head and neck cancer- clinically he is in remission and this was back in 2001. No continued surveillance imaging or labs required at this point  2. New lung nodule- repeat CT thorax shows that this has resolved likely suggesting infectious etiology. No further follow up required for this but given ongoing smoking he will continue to get low dose screening CT and can be referred to [place] if there are any concerning findings  3. Tobacco dependence- I have suggested that patient should f/u with his pcp for this since he does not need continued oncology f/u at this time. Patient would like to try something different for his smoking cessation and perhaps wellbutrin could be considered  4. Patient will need continued annual surveillance of "prominence of thoracic aotar" noted on imaging by his PCP and  referral to vascular surgery if appropriate  Patient can continue to f/u with his pcp at this time and I will alert Burgess Estelle regarding continuing his low dose surveillance ct annually   Visit Diagnosis 1. Lung nodule   2. Tobacco dependence   3. Cancer of head and neck ([place])   4. Abnormal CT scan, lung      Dr. Randa Evens, MD, MPH [facility] Pager- [phone] 04/17/2016 7:26 AM

## 2016-05-10 DIAGNOSIS — R739 Hyperglycemia, unspecified: Secondary | ICD-10-CM | POA: Diagnosis not present

## 2016-05-10 DIAGNOSIS — I10 Essential (primary) hypertension: Secondary | ICD-10-CM | POA: Diagnosis not present

## 2016-05-10 DIAGNOSIS — Z0000 Encounter for general adult medical examination without abnormal findings: Secondary | ICD-10-CM | POA: Diagnosis not present

## 2016-05-10 DIAGNOSIS — Z79899 Other long term (current) drug therapy: Secondary | ICD-10-CM | POA: Diagnosis not present

## 2016-05-10 DIAGNOSIS — Z125 Encounter for screening for malignant neoplasm of prostate: Secondary | ICD-10-CM | POA: Diagnosis not present

## 2016-07-21 DIAGNOSIS — Z86010 Personal history of colonic polyps: Secondary | ICD-10-CM | POA: Diagnosis not present

## 2016-09-01 DIAGNOSIS — K219 Gastro-esophageal reflux disease without esophagitis: Secondary | ICD-10-CM | POA: Diagnosis not present

## 2016-09-01 DIAGNOSIS — R1310 Dysphagia, unspecified: Secondary | ICD-10-CM | POA: Diagnosis not present

## 2016-09-01 DIAGNOSIS — I208 Other forms of angina pectoris: Secondary | ICD-10-CM | POA: Diagnosis not present

## 2016-09-01 DIAGNOSIS — Z86010 Personal history of colonic polyps: Secondary | ICD-10-CM | POA: Diagnosis not present

## 2016-10-08 ENCOUNTER — Other Ambulatory Visit: Payer: BLUE CROSS/BLUE SHIELD

## 2016-10-08 ENCOUNTER — Ambulatory Visit: Payer: BLUE CROSS/BLUE SHIELD | Admitting: Internal Medicine

## 2016-10-21 DIAGNOSIS — Z23 Encounter for immunization: Secondary | ICD-10-CM | POA: Diagnosis not present

## 2016-10-21 DIAGNOSIS — R079 Chest pain, unspecified: Secondary | ICD-10-CM | POA: Diagnosis not present

## 2016-10-27 DIAGNOSIS — R079 Chest pain, unspecified: Secondary | ICD-10-CM | POA: Diagnosis not present

## 2016-11-12 DIAGNOSIS — I10 Essential (primary) hypertension: Secondary | ICD-10-CM | POA: Diagnosis not present

## 2016-11-12 DIAGNOSIS — R0789 Other chest pain: Secondary | ICD-10-CM | POA: Diagnosis not present

## 2016-11-12 DIAGNOSIS — E7800 Pure hypercholesterolemia, unspecified: Secondary | ICD-10-CM | POA: Diagnosis not present

## 2016-11-24 DIAGNOSIS — R0789 Other chest pain: Secondary | ICD-10-CM | POA: Diagnosis not present

## 2016-11-26 ENCOUNTER — Encounter: Payer: Self-pay | Admitting: *Deleted

## 2016-11-29 ENCOUNTER — Ambulatory Visit: Payer: BLUE CROSS/BLUE SHIELD | Admitting: Certified Registered Nurse Anesthetist

## 2016-11-29 ENCOUNTER — Encounter
Admission: RE | Disposition: A | Discharge status: Home / Self Care | Payer: Self-pay | Source: Ambulatory Visit | Attending: Unknown Physician Specialty

## 2016-11-29 ENCOUNTER — Ambulatory Visit
Admission: RE | Admit: 2016-11-29 | Discharge: 2016-11-29 | Disposition: A | Discharge status: Home / Self Care | Payer: BLUE CROSS/BLUE SHIELD | Source: Ambulatory Visit | Attending: Unknown Physician Specialty | Admitting: Unknown Physician Specialty

## 2016-11-29 DIAGNOSIS — F17210 Nicotine dependence, cigarettes, uncomplicated: Secondary | ICD-10-CM | POA: Diagnosis not present

## 2016-11-29 DIAGNOSIS — K621 Rectal polyp: Secondary | ICD-10-CM | POA: Diagnosis not present

## 2016-11-29 DIAGNOSIS — Z86010 Personal history of colonic polyps: Secondary | ICD-10-CM | POA: Diagnosis not present

## 2016-11-29 DIAGNOSIS — F329 Major depressive disorder, single episode, unspecified: Secondary | ICD-10-CM | POA: Insufficient documentation

## 2016-11-29 DIAGNOSIS — K222 Esophageal obstruction: Secondary | ICD-10-CM | POA: Diagnosis not present

## 2016-11-29 DIAGNOSIS — F419 Anxiety disorder, unspecified: Secondary | ICD-10-CM | POA: Insufficient documentation

## 2016-11-29 DIAGNOSIS — K5790 Diverticulosis of intestine, part unspecified, without perforation or abscess without bleeding: Secondary | ICD-10-CM | POA: Insufficient documentation

## 2016-11-29 DIAGNOSIS — Z79899 Other long term (current) drug therapy: Secondary | ICD-10-CM | POA: Diagnosis not present

## 2016-11-29 DIAGNOSIS — Z1211 Encounter for screening for malignant neoplasm of colon: Secondary | ICD-10-CM | POA: Diagnosis not present

## 2016-11-29 DIAGNOSIS — K640 First degree hemorrhoids: Secondary | ICD-10-CM | POA: Insufficient documentation

## 2016-11-29 DIAGNOSIS — K219 Gastro-esophageal reflux disease without esophagitis: Secondary | ICD-10-CM | POA: Diagnosis not present

## 2016-11-29 DIAGNOSIS — M1990 Unspecified osteoarthritis, unspecified site: Secondary | ICD-10-CM | POA: Diagnosis not present

## 2016-11-29 DIAGNOSIS — I10 Essential (primary) hypertension: Secondary | ICD-10-CM | POA: Diagnosis not present

## 2016-11-29 DIAGNOSIS — E079 Disorder of thyroid, unspecified: Secondary | ICD-10-CM | POA: Diagnosis not present

## 2016-11-29 DIAGNOSIS — J449 Chronic obstructive pulmonary disease, unspecified: Secondary | ICD-10-CM | POA: Diagnosis not present

## 2016-11-29 DIAGNOSIS — R1310 Dysphagia, unspecified: Secondary | ICD-10-CM | POA: Diagnosis not present

## 2016-11-29 DIAGNOSIS — D120 Benign neoplasm of cecum: Secondary | ICD-10-CM | POA: Insufficient documentation

## 2016-11-29 HISTORY — DX: Disorder of thyroid, unspecified: E07.9

## 2016-11-29 HISTORY — DX: Anxiety disorder, unspecified: F41.9

## 2016-11-29 HISTORY — DX: Unspecified osteoarthritis, unspecified site: M19.90

## 2016-11-29 HISTORY — DX: Gastro-esophageal reflux disease without esophagitis: K21.9

## 2016-11-29 HISTORY — DX: Major depressive disorder, single episode, unspecified: F32.9

## 2016-11-29 HISTORY — DX: Depression, unspecified: F32.A

## 2016-11-29 HISTORY — PX: COLONOSCOPY WITH PROPOFOL: SHX5780

## 2016-11-29 HISTORY — DX: Unspecified cataract: H26.9

## 2016-11-29 HISTORY — PX: ESOPHAGOGASTRODUODENOSCOPY (EGD) WITH PROPOFOL: SHX5813

## 2016-11-29 SURGERY — ESOPHAGOGASTRODUODENOSCOPY (EGD) WITH PROPOFOL
Anesthesia: General

## 2016-11-29 MED ORDER — LIDOCAINE HCL (CARDIAC) 20 MG/ML IV SOLN
INTRAVENOUS | Status: DC | PRN
  Administered 2016-11-29: 100 mg via INTRAVENOUS

## 2016-11-29 MED ORDER — PROPOFOL 10 MG/ML IV BOLUS
INTRAVENOUS | Status: DC | PRN
  Administered 2016-11-29: 60 mg via INTRAVENOUS
  Administered 2016-11-29: 20 mg via INTRAVENOUS

## 2016-11-29 MED ORDER — LIDOCAINE HCL (PF) 2 % IJ SOLN
INTRAMUSCULAR | Status: AC
  Filled 2016-11-29: qty 10

## 2016-11-29 MED ORDER — FENTANYL CITRATE (PF) 100 MCG/2ML IJ SOLN
INTRAMUSCULAR | Status: AC
  Filled 2016-11-29: qty 2

## 2016-11-29 MED ORDER — PROPOFOL 500 MG/50ML IV EMUL
INTRAVENOUS | Status: DC | PRN
  Administered 2016-11-29: 130 ug/kg/min via INTRAVENOUS

## 2016-11-29 MED ORDER — SODIUM CHLORIDE 0.9 % IV SOLN: INTRAVENOUS | Status: DC

## 2016-11-29 MED ORDER — PHENYLEPHRINE HCL 10 MG/ML IJ SOLN
INTRAMUSCULAR | Status: DC | PRN
  Administered 2016-11-29 (×2): 50 ug via INTRAVENOUS
  Administered 2016-11-29: 100 ug via INTRAVENOUS
  Administered 2016-11-29: 50 ug via INTRAVENOUS
  Administered 2016-11-29: 100 ug via INTRAVENOUS
  Administered 2016-11-29: 50 ug via INTRAVENOUS
  Administered 2016-11-29 (×3): 100 ug via INTRAVENOUS

## 2016-11-29 MED ORDER — SODIUM CHLORIDE 0.9 % IV SOLN
INTRAVENOUS | Status: DC
  Administered 2016-11-29: 13:00:00 via INTRAVENOUS

## 2016-11-29 MED ORDER — GLYCOPYRROLATE 0.2 MG/ML IJ SOLN
INTRAMUSCULAR | Status: AC
  Filled 2016-11-29: qty 1

## 2016-11-29 MED ORDER — GLYCOPYRROLATE 0.2 MG/ML IJ SOLN
INTRAMUSCULAR | Status: DC | PRN
  Administered 2016-11-29 (×2): 0.1 mg via INTRAVENOUS
  Administered 2016-11-29: 0.2 mg via INTRAVENOUS

## 2016-11-29 MED ORDER — PROPOFOL 500 MG/50ML IV EMUL
INTRAVENOUS | Status: AC
  Filled 2016-11-29: qty 50

## 2016-11-29 MED ORDER — FENTANYL CITRATE (PF) 100 MCG/2ML IJ SOLN
INTRAMUSCULAR | Status: DC | PRN
  Administered 2016-11-29: 50 ug via INTRAVENOUS

## 2016-11-29 MED ORDER — EPHEDRINE SULFATE 50 MG/ML IJ SOLN
INTRAMUSCULAR | Status: DC | PRN
  Administered 2016-11-29: 10 mg via INTRAVENOUS
  Administered 2016-11-29: 5 mg via INTRAVENOUS

## 2016-11-29 MED ORDER — PROPOFOL 10 MG/ML IV BOLUS
INTRAVENOUS | Status: AC
  Filled 2016-11-29: qty 20

## 2016-11-29 NOTE — Anesthesia Procedure Notes (Signed)
Procedures

## 2016-11-29 NOTE — Anesthesia Procedure Notes (Signed)
Performed by: Susannah Carbin, CRNA Pre-anesthesia Checklist: Patient identified, Emergency Drugs available, Suction available, Patient being monitored and Timeout performed Patient Re-evaluated:Patient Re-evaluated prior to induction Oxygen Delivery Method: Nasal cannula Induction Type: IV induction       

## 2016-11-29 NOTE — Transfer of Care (Signed)
Immediate Anesthesia Transfer of Care Note  Patient: Luke [name]  Procedure(s) Performed: ESOPHAGOGASTRODUODENOSCOPY (EGD) WITH PROPOFOL (N/A ) COLONOSCOPY WITH PROPOFOL (N/A )  Patient Location: PACU  Anesthesia Type:General  Level of Consciousness: sedated  Airway & Oxygen Therapy: Patient Spontanous Breathing and Patient connected to nasal cannula oxygen  Post-op Assessment: Report given to RN and Post -op Vital signs reviewed and stable  Post vital signs: Reviewed and stable  Last Vitals:  Vitals:   11/29/16 1244 11/29/16 1407  BP: (!) 148/98 (!) 99/58  Pulse: 72 75  Resp: 18 12  Temp: (!) 36.3 C 36.6 C  SpO2: 100% 99%    Last Pain:  Vitals:   11/29/16 1407  TempSrc: Tympanic  PainSc: Asleep         Complications: No apparent anesthesia complications

## 2016-11-29 NOTE — H&P (Signed)
Primary Care Physician:  Derinda Late, MD Primary Gastroenterologist:  Dr. Vira Agar  Pre-Procedure History & Physical: HPI:  Luke [name] is a 66 y.o. male is here for an endoscopy and colonoscopy.   Past Medical History:  Diagnosis Date  . Anxiety   . Arthritis   . Carcinoma metastatic to lymph node ([place])   . Cataract    right  . Depression   . GERD (gastroesophageal reflux disease)   . Hoarseness of voice   . Hypertension   . Personal history of tobacco use, presenting hazards to health February of 2001  . Thyroid disease     Past Surgical History:  Procedure Laterality Date  . COLONOSCOPY    . NECK SURGERY     status post diagnosis of cancer with chemo and radiation  . TONSILLECTOMY      Prior to Admission medications   Medication Sig Start Date End Date Taking? Authorizing Provider  albuterol (PROVENTIL HFA;VENTOLIN HFA) 108 (90 Base) MCG/ACT inhaler Inhale 2 puffs every 6 (six) hours as needed into the lungs for wheezing or shortness of breath.   Yes [provider]  ALPRAZolam Duanne Moron) 0.25 MG tablet Take 0.25 mg by mouth. 01/08/16  Yes [provider]  levothyroxine (SYNTHROID, LEVOTHROID) 88 MCG tablet TAKE 1 TABLET BY MOUTH ONCE A DAY ON AN EMPTY STOMACH. 07/17/14  Yes [provider]  meloxicam (MOBIC) 15 MG tablet TK 1 T PO QD 09/14/14  Yes [provider]  Multiple Vitamins-Minerals (MULTIVITAMIN PO) Take by mouth.   Yes [provider]  omeprazole (PRILOSEC) 20 MG capsule TAKE ONE CAPSULE BY MOUTH EVERY DAY FOR HEART BURN 10/30/13  Yes [provider]  ibuprofen (ADVIL,MOTRIN) 800 MG tablet TAKE 1 TABLET BY MOUTH EVERY 6 HOURS AS NEEDED 09/03/14   [provider]  simvastatin (ZOCOR) 20 MG tablet Take by mouth. 09/06/14 09/06/15  [provider]  varenicline (CHANTIX CONTINUING MONTH PAK) 1 MG tablet Take 1 tablet (1 mg total) by mouth 2 (two) times daily. Day 1-3 take 1/2 tablet daily, Day 4  -7 take 1/2 tablet twice daily, then Day 8 onwards take 1 tablet twice daily Patient not taking: Reported on 11/29/2016 02/27/16   Sindy Guadeloupe, MD    Allergies as of 09/06/2016  . (No Known Allergies)    Family History  Problem Relation Age of Onset  . Breast cancer Mother   . Heart attack Father     Social History   Socioeconomic History  . Marital status: Single    Spouse name: Not on file  . Number of children: Not on file  . Years of education: Not on file  . Highest education level: Not on file  Social Needs  . Financial resource strain: Not on file  . Food insecurity - worry: Not on file  . Food insecurity - inability: Not on file  . Transportation needs - medical: Not on file  . Transportation needs - non-medical: Not on file  Occupational History  . Not on file  Tobacco Use  . Smoking status: Current Every Day Smoker    Packs/day: 0.25    Years: 40.00    Pack years: 10.00    Types: Cigarettes  . Smokeless tobacco: Never Used  Substance and Sexual Activity  . Alcohol use: No  . Drug use: No  . Sexual activity: Not on file  Other Topics Concern  . Not on file  Social History Narrative  . Not on file  Review of Systems: See HPI, otherwise negative ROS  Physical Exam: BP (!) 148/98   Pulse 72   Temp (!) 97.4 F (36.3 C) (Tympanic)   Resp 18   Ht 6\' 1"  (1.854 m)   Wt 88.5 kg (195 lb)   SpO2 100%   BMI 25.73 kg/m  General:   Alert,  pleasant and cooperative in NAD Head:  Normocephalic and atraumatic. Neck:  Supple; no masses or thyromegaly. Lungs:  Clear throughout to auscultation.    Heart:  Regular rate and rhythm. Abdomen:  Soft, nontender and nondistended. Normal bowel sounds, without guarding, and without rebound.   Neurologic:  Alert and  oriented x4;  grossly normal neurologically.  Impression/Plan: Luke [name] is here for an endoscopy and colonoscopy to be performed for dysphagia and PH colon polyps.  Risks, benefits,  limitations, and alternatives regarding  endoscopy and colonoscopy have been reviewed with the patient.  Questions have been answered.  All parties agreeable.   Gaylyn Cheers, MD  11/29/2016, 1:10 PM

## 2016-11-29 NOTE — Op Note (Signed)
[facility] Gastroenterology Patient Name: Luke [name] Procedure Date: 11/29/2016 1:12 PM MRN: [id number] Account #: [id number] Date of Birth: [birth year] Admit Type: Outpatient Age: 66 Room: [facility] ENDO ROOM 3 Gender: Male Note Status: Finalized Procedure:            Colonoscopy Indications:          High risk colon cancer surveillance: Personal history                        of colonic polyps Providers:            Manya Silvas, MD Referring MD:         Caprice Renshaw MD (Referring MD) Medicines:            Propofol per Anesthesia Complications:        No immediate complications. Procedure:            Pre-Anesthesia Assessment:                       - After reviewing the risks and benefits, the patient                        was deemed in satisfactory condition to undergo the                        procedure.                       After obtaining informed consent, the colonoscope was                        passed under direct vision. Throughout the procedure,                        the patient's blood pressure, pulse, and oxygen                        saturations were monitored continuously. The                        Colonoscope was introduced through the anus and                        advanced to the the cecum, identified by appendiceal                        orifice and ileocecal valve. The quality of the bowel                        preparation was fair. Findings:      Three sessile polyps were found in the rectum. The polyps were small in       size. These polyps were removed with a hot snare. Resection and       retrieval were complete.      A diminutive polyp was found in the rectum. The polyp was sessile. The       polyp was removed with a cold snare. Resection and retrieval were       complete.      A diminutive polyp was found in the cecum. The polyp was sessile. The       polyp  was removed with a jumbo cold forceps. Resection and retrieval        were complete.      Internal hemorrhoids were found during endoscopy. The hemorrhoids were       small and Grade I (internal hemorrhoids that do not prolapse).      THE PREP WAS NOT SATISFACTORY WITH MUCH STOOL REMAINING IN THE COLON IN       SOME LOCATIONS AND THIS MAKES THE PREP NOT GOOD ENOUGH TO EXCLUDE OTHER       POLYPS AND A REPEAT COLON EXAM IS RECOMMENDED FOR SOME TIME IN THE NEXT       6 MONTHS. 2l OF FLUID WAS LAVAGED ON THE COLON AND STILL WAS NOT       SATISFACTORY.. Impression:           - Preparation of the colon was fair.                       - Three small polyps in the rectum, removed with a hot                        snare. Resected and retrieved.                       - One diminutive polyp in the rectum, removed with a                        cold snare. Resected and retrieved.                       - One diminutive polyp in the cecum, removed with a                        jumbo cold forceps. Resected and retrieved.                       - Internal hemorrhoids. Recommendation:       - Await pathology results. Manya Silvas, MD 11/29/2016 2:09:33 PM This report has been signed electronically. Number of Addenda: 0 Note Initiated On: 11/29/2016 1:12 PM Scope Withdrawal Time: 0 hours 22 minutes 59 seconds  Total Procedure Duration: 0 hours 32 minutes 55 seconds       [facility]

## 2016-11-29 NOTE — Anesthesia Preprocedure Evaluation (Signed)
Anesthesia Evaluation  Patient identified by MRN, date of birth, ID band Patient awake    Reviewed: Allergy & Precautions, NPO status , Patient's Chart, lab work & pertinent test results  Airway Mallampati: II       Dental  (+) Teeth Intact, Missing   Pulmonary COPD, Current Smoker,     + decreased breath sounds      Cardiovascular hypertension,  Rhythm:Regular Rate:Tachycardia     Neuro/Psych Anxiety Depression    GI/Hepatic negative GI ROS, Neg liver ROS, GERD  Medicated,  Endo/Other  Hypothyroidism   Renal/GU negative Renal ROS     Musculoskeletal   Abdominal Normal abdominal exam  (+)   Peds negative pediatric ROS (+)  Hematology negative hematology ROS (+)   Anesthesia Other Findings   Reproductive/Obstetrics                             Anesthesia Physical Anesthesia Plan  ASA: III  Anesthesia Plan: General   Post-op Pain Management:    Induction: Intravenous  PONV Risk Score and Plan: 0  Airway Management Planned: Natural Airway and Nasal Cannula  Additional Equipment:   Intra-op Plan:   Post-operative Plan:   Informed Consent: I have reviewed the patients History and Physical, chart, labs and discussed the procedure including the risks, benefits and alternatives for the proposed anesthesia with the patient or authorized representative who has indicated his/her understanding and acceptance.     Plan Discussed with: CRNA  Anesthesia Plan Comments:         Anesthesia Quick Evaluation

## 2016-11-29 NOTE — Anesthesia Post-op Follow-up Note (Signed)
Anesthesia QCDR form completed.        

## 2016-11-29 NOTE — Op Note (Signed)
[facility] Gastroenterology Patient Name: Luke [name] Procedure Date: 11/29/2016 1:12 PM MRN: [id number] Account #: [id number] Date of Birth: 09-04-1950 Admit Type: Outpatient Age: 66 Room: [facility] ENDO ROOM 3 Gender: Male Note Status: Finalized Procedure:            Upper GI endoscopy Indications:          Dysphagia Providers:            Manya Silvas, MD Referring MD:         Caprice Renshaw MD (Referring MD) Medicines:            Propofol per Anesthesia Complications:        No immediate complications. Procedure:            Pre-Anesthesia Assessment:                       - After reviewing the risks and benefits, the patient                        was deemed in satisfactory condition to undergo the                        procedure.                       After obtaining informed consent, the endoscope was                        passed under direct vision. Throughout the procedure,                        the patient's blood pressure, pulse, and oxygen                        saturations were monitored continuously. The                        Colonoscope was introduced through the mouth, and                        advanced to the second part of duodenum. The upper GI                        endoscopy was accomplished without difficulty. The                        patient tolerated the procedure well. Findings:      A mild Schatzki ring (acquired) was found at the gastroesophageal       junction. A guidewire was placed and the scope was withdrawn. Dilation       was performed with a Savary dilator with mild resistance at 14 mm, 15 mm       and 16 mm.      The stomach was normal.      The examined duodenum was normal. Impression:           - Mild Schatzki ring. Dilated.                       - Normal stomach.                       -  Normal examined duodenum.                       - No specimens collected. Recommendation:       - Perform a colonoscopy as  previously scheduled. Manya Silvas, MD 11/29/2016 1:28:26 PM This report has been signed electronically. Number of Addenda: 0 Note Initiated On: 11/29/2016 1:12 PM      [facility]

## 2016-11-30 ENCOUNTER — Encounter: Payer: Self-pay | Admitting: Unknown Physician Specialty

## 2016-11-30 NOTE — Anesthesia Postprocedure Evaluation (Signed)
Anesthesia Post Note  Patient: Luke [name]  Procedure(s) Performed: ESOPHAGOGASTRODUODENOSCOPY (EGD) WITH PROPOFOL (N/A ) COLONOSCOPY WITH PROPOFOL (N/A )  Patient location during evaluation: PACU Anesthesia Type: General Level of consciousness: awake Pain management: pain level controlled Vital Signs Assessment: post-procedure vital signs reviewed and stable Respiratory status: nonlabored ventilation Cardiovascular status: stable Anesthetic complications: no     Last Vitals:  Vitals:   11/29/16 1427 11/29/16 1437  BP: 118/70 (!) 133/92  Pulse: 77 78  Resp: 12 (!) 8  Temp:    SpO2: 99% 97%    Last Pain:  Vitals:   11/29/16 1407  TempSrc: Tympanic  PainSc: Asleep                 VAN STAVEREN,Hajra Port

## 2016-12-01 LAB — SURGICAL PATHOLOGY

## 2017-01-13 DIAGNOSIS — J449 Chronic obstructive pulmonary disease, unspecified: Secondary | ICD-10-CM | POA: Diagnosis not present

## 2017-01-13 DIAGNOSIS — J209 Acute bronchitis, unspecified: Secondary | ICD-10-CM | POA: Diagnosis not present

## 2017-01-27 DIAGNOSIS — J441 Chronic obstructive pulmonary disease with (acute) exacerbation: Secondary | ICD-10-CM | POA: Diagnosis not present

## 2017-01-27 DIAGNOSIS — Z79899 Other long term (current) drug therapy: Secondary | ICD-10-CM | POA: Diagnosis not present

## 2017-01-27 DIAGNOSIS — I10 Essential (primary) hypertension: Secondary | ICD-10-CM | POA: Diagnosis not present

## 2017-01-27 DIAGNOSIS — R739 Hyperglycemia, unspecified: Secondary | ICD-10-CM | POA: Diagnosis not present

## 2017-04-11 ENCOUNTER — Telehealth: Payer: Self-pay | Admitting: *Deleted

## 2017-04-11 NOTE — Telephone Encounter (Signed)
Attempted to leave message for patient to notify them that it is time to schedule annual low dose lung cancer screening CT scan. However, this option is not available.  

## 2017-04-22 ENCOUNTER — Telehealth: Payer: Self-pay | Admitting: *Deleted

## 2017-04-22 NOTE — Telephone Encounter (Signed)
Attempted to leave message for patient to notify them that it is time to schedule annual low dose lung cancer screening CT scan. However there is no voicemail option. Will mail notification.

## 2017-04-28 ENCOUNTER — Encounter: Payer: Self-pay | Admitting: *Deleted

## 2017-06-16 DIAGNOSIS — J449 Chronic obstructive pulmonary disease, unspecified: Secondary | ICD-10-CM | POA: Diagnosis not present

## 2017-06-16 DIAGNOSIS — J209 Acute bronchitis, unspecified: Secondary | ICD-10-CM | POA: Diagnosis not present

## 2017-06-16 DIAGNOSIS — R05 Cough: Secondary | ICD-10-CM | POA: Diagnosis not present

## 2017-06-16 DIAGNOSIS — R0602 Shortness of breath: Secondary | ICD-10-CM | POA: Diagnosis not present

## 2017-06-24 DIAGNOSIS — J449 Chronic obstructive pulmonary disease, unspecified: Secondary | ICD-10-CM | POA: Diagnosis not present

## 2017-07-01 ENCOUNTER — Encounter: Payer: Self-pay | Admitting: Family Medicine

## 2017-07-01 ENCOUNTER — Other Ambulatory Visit: Payer: Self-pay

## 2017-07-01 ENCOUNTER — Emergency Department: Payer: BLUE CROSS/BLUE SHIELD

## 2017-07-01 ENCOUNTER — Ambulatory Visit: Payer: BLUE CROSS/BLUE SHIELD | Admitting: Family Medicine

## 2017-07-01 ENCOUNTER — Inpatient Hospital Stay
Admission: EM | Admit: 2017-07-01 | Discharge: 2017-07-03 | DRG: 190 | Disposition: A | Discharge status: Home / Self Care | Payer: BLUE CROSS/BLUE SHIELD | Attending: Internal Medicine | Admitting: Internal Medicine

## 2017-07-01 VITALS — BP 95/60 | HR 78 | Temp 98.5°F | Resp 16 | Ht 73.0 in | Wt 197.4 lb

## 2017-07-01 DIAGNOSIS — T380X5A Adverse effect of glucocorticoids and synthetic analogues, initial encounter: Secondary | ICD-10-CM | POA: Diagnosis not present

## 2017-07-01 DIAGNOSIS — I10 Essential (primary) hypertension: Secondary | ICD-10-CM | POA: Diagnosis present

## 2017-07-01 DIAGNOSIS — R7301 Impaired fasting glucose: Secondary | ICD-10-CM | POA: Diagnosis not present

## 2017-07-01 DIAGNOSIS — F17210 Nicotine dependence, cigarettes, uncomplicated: Secondary | ICD-10-CM | POA: Diagnosis present

## 2017-07-01 DIAGNOSIS — Z9221 Personal history of antineoplastic chemotherapy: Secondary | ICD-10-CM | POA: Diagnosis not present

## 2017-07-01 DIAGNOSIS — J690 Pneumonitis due to inhalation of food and vomit: Secondary | ICD-10-CM | POA: Diagnosis present

## 2017-07-01 DIAGNOSIS — Z23 Encounter for immunization: Secondary | ICD-10-CM | POA: Diagnosis not present

## 2017-07-01 DIAGNOSIS — R1319 Other dysphagia: Secondary | ICD-10-CM | POA: Diagnosis not present

## 2017-07-01 DIAGNOSIS — Z79899 Other long term (current) drug therapy: Secondary | ICD-10-CM

## 2017-07-01 DIAGNOSIS — F419 Anxiety disorder, unspecified: Secondary | ICD-10-CM | POA: Diagnosis not present

## 2017-07-01 DIAGNOSIS — J441 Chronic obstructive pulmonary disease with (acute) exacerbation: Secondary | ICD-10-CM | POA: Diagnosis not present

## 2017-07-01 DIAGNOSIS — Z803 Family history of malignant neoplasm of breast: Secondary | ICD-10-CM

## 2017-07-01 DIAGNOSIS — E039 Hypothyroidism, unspecified: Secondary | ICD-10-CM | POA: Diagnosis not present

## 2017-07-01 DIAGNOSIS — F329 Major depressive disorder, single episode, unspecified: Secondary | ICD-10-CM | POA: Diagnosis present

## 2017-07-01 DIAGNOSIS — R1310 Dysphagia, unspecified: Secondary | ICD-10-CM | POA: Diagnosis not present

## 2017-07-01 DIAGNOSIS — C779 Secondary and unspecified malignant neoplasm of lymph node, unspecified: Secondary | ICD-10-CM | POA: Diagnosis not present

## 2017-07-01 DIAGNOSIS — Z7689 Persons encountering health services in other specified circumstances: Secondary | ICD-10-CM

## 2017-07-01 DIAGNOSIS — Z66 Do not resuscitate: Secondary | ICD-10-CM | POA: Diagnosis present

## 2017-07-01 DIAGNOSIS — Z716 Tobacco abuse counseling: Secondary | ICD-10-CM

## 2017-07-01 DIAGNOSIS — R0602 Shortness of breath: Secondary | ICD-10-CM | POA: Diagnosis not present

## 2017-07-01 DIAGNOSIS — Z8579 Personal history of other malignant neoplasms of lymphoid, hematopoietic and related tissues: Secondary | ICD-10-CM

## 2017-07-01 DIAGNOSIS — Z8249 Family history of ischemic heart disease and other diseases of the circulatory system: Secondary | ICD-10-CM

## 2017-07-01 DIAGNOSIS — K219 Gastro-esophageal reflux disease without esophagitis: Secondary | ICD-10-CM | POA: Diagnosis not present

## 2017-07-01 DIAGNOSIS — Z79890 Hormone replacement therapy (postmenopausal): Secondary | ICD-10-CM

## 2017-07-01 DIAGNOSIS — Z923 Personal history of irradiation: Secondary | ICD-10-CM | POA: Diagnosis not present

## 2017-07-01 DIAGNOSIS — I959 Hypotension, unspecified: Secondary | ICD-10-CM | POA: Diagnosis not present

## 2017-07-01 DIAGNOSIS — Z720 Tobacco use: Secondary | ICD-10-CM | POA: Diagnosis not present

## 2017-07-01 DIAGNOSIS — J189 Pneumonia, unspecified organism: Secondary | ICD-10-CM | POA: Diagnosis not present

## 2017-07-01 LAB — CBC
HCT: 38.9 % — ABNORMAL LOW (ref 40.0–52.0)
HEMOGLOBIN: 13.2 g/dL (ref 13.0–18.0)
MCH: 31.4 pg (ref 26.0–34.0)
MCHC: 33.9 g/dL (ref 32.0–36.0)
MCV: 92.7 fL (ref 80.0–100.0)
Platelets: 272 10*3/uL (ref 150–440)
RBC: 4.19 MIL/uL — ABNORMAL LOW (ref 4.40–5.90)
RDW: 13.6 % (ref 11.5–14.5)
WBC: 16 10*3/uL — ABNORMAL HIGH (ref 3.8–10.6)

## 2017-07-01 LAB — BASIC METABOLIC PANEL
Anion gap: 7 (ref 5–15)
BUN: 22 mg/dL — AB (ref 6–20)
CHLORIDE: 103 mmol/L (ref 101–111)
CO2: 28 mmol/L (ref 22–32)
Calcium: 8.6 mg/dL — ABNORMAL LOW (ref 8.9–10.3)
Creatinine, Ser: 1.22 mg/dL (ref 0.61–1.24)
GFR calc Af Amer: >60 mL/min (ref >60)
GFR calc non Af Amer: >60 mL/min (ref >60)
Glucose, Bld: 96 mg/dL (ref 65–99)
Potassium: 4.2 mmol/L (ref 3.5–5.1)
SODIUM: 138 mmol/L (ref 135–145)

## 2017-07-01 LAB — BRAIN NATRIURETIC PEPTIDE: B Natriuretic Peptide: 105 pg/mL — ABNORMAL HIGH (ref 0.0–100.0)

## 2017-07-01 LAB — TROPONIN I

## 2017-07-01 LAB — LACTIC ACID, PLASMA: LACTIC ACID, VENOUS: 1 mmol/L (ref 0.5–1.9)

## 2017-07-01 MED ORDER — AMLODIPINE BESYLATE 5 MG PO TABS
2.5000 mg | ORAL_TABLET | Freq: Every day | ORAL | Status: DC
  Administered 2017-07-01: 2.5 mg via ORAL
  Filled 2017-07-01 (×2): qty 1

## 2017-07-01 MED ORDER — METHYLPREDNISOLONE SODIUM SUCC 125 MG IJ SOLR
125.0000 mg | Freq: Once | INTRAMUSCULAR | Status: AC
  Administered 2017-07-01: 125 mg via INTRAVENOUS
  Filled 2017-07-01: qty 2

## 2017-07-01 MED ORDER — IPRATROPIUM-ALBUTEROL 0.5-2.5 (3) MG/3ML IN SOLN
3.0000 mL | Freq: Once | RESPIRATORY_TRACT | Status: AC
  Administered 2017-07-01: 3 mL via RESPIRATORY_TRACT
  Filled 2017-07-01: qty 3

## 2017-07-01 MED ORDER — METRONIDAZOLE 500 MG PO TABS
500.0000 mg | ORAL_TABLET | Freq: Three times a day (TID) | ORAL | Status: DC
  Administered 2017-07-01 – 2017-07-03 (×5): 500 mg via ORAL
  Filled 2017-07-01 (×5): qty 1

## 2017-07-01 MED ORDER — ONDANSETRON HCL 4 MG/2ML IJ SOLN: 4.0000 mg | Freq: Four times a day (QID) | INTRAMUSCULAR | Status: DC | PRN

## 2017-07-01 MED ORDER — ALBUTEROL SULFATE (2.5 MG/3ML) 0.083% IN NEBU: 2.5000 mg | INHALATION_SOLUTION | RESPIRATORY_TRACT | Status: DC | PRN

## 2017-07-01 MED ORDER — LEVOFLOXACIN IN D5W 750 MG/150ML IV SOLN
750.0000 mg | INTRAVENOUS | Status: DC
  Administered 2017-07-02: 750 mg via INTRAVENOUS
  Filled 2017-07-01 (×3): qty 150

## 2017-07-01 MED ORDER — LEVOFLOXACIN IN D5W 750 MG/150ML IV SOLN
750.0000 mg | Freq: Once | INTRAVENOUS | Status: AC
  Administered 2017-07-01: 750 mg via INTRAVENOUS
  Filled 2017-07-01: qty 150

## 2017-07-01 MED ORDER — IPRATROPIUM-ALBUTEROL 0.5-2.5 (3) MG/3ML IN SOLN
3.0000 mL | Freq: Four times a day (QID) | RESPIRATORY_TRACT | Status: DC
  Administered 2017-07-01 – 2017-07-02 (×5): 3 mL via RESPIRATORY_TRACT
  Filled 2017-07-01 (×5): qty 3

## 2017-07-01 MED ORDER — ENOXAPARIN SODIUM 40 MG/0.4ML ~~LOC~~ SOLN
40.0000 mg | SUBCUTANEOUS | Status: DC
  Filled 2017-07-01 (×2): qty 0.4

## 2017-07-01 MED ORDER — ONDANSETRON HCL 4 MG PO TABS: 4.0000 mg | ORAL_TABLET | Freq: Four times a day (QID) | ORAL | Status: DC | PRN

## 2017-07-01 MED ORDER — LEVOTHYROXINE SODIUM 88 MCG PO TABS
88.0000 ug | ORAL_TABLET | Freq: Every day | ORAL | Status: DC
  Administered 2017-07-02 – 2017-07-03 (×2): 88 ug via ORAL
  Filled 2017-07-01 (×2): qty 1

## 2017-07-01 MED ORDER — POLYETHYLENE GLYCOL 3350 17 G PO PACK
17.0000 g | PACK | Freq: Every day | ORAL | Status: DC | PRN
  Administered 2017-07-02: 17 g via ORAL
  Filled 2017-07-01: qty 1

## 2017-07-01 MED ORDER — PANTOPRAZOLE SODIUM 40 MG PO TBEC
40.0000 mg | DELAYED_RELEASE_TABLET | Freq: Every day | ORAL | Status: DC
  Administered 2017-07-02 – 2017-07-03 (×2): 40 mg via ORAL
  Filled 2017-07-01 (×2): qty 1

## 2017-07-01 MED ORDER — PNEUMOCOCCAL VAC POLYVALENT 25 MCG/0.5ML IJ INJ
0.5000 mL | INJECTION | INTRAMUSCULAR | Status: AC
  Administered 2017-07-02: 09:00:00 0.5 mL via INTRAMUSCULAR
  Filled 2017-07-01: qty 0.5

## 2017-07-01 MED ORDER — ACETAMINOPHEN 325 MG PO TABS: 650.0000 mg | ORAL_TABLET | Freq: Four times a day (QID) | ORAL | Status: DC | PRN

## 2017-07-01 MED ORDER — METHYLPREDNISOLONE SODIUM SUCC 125 MG IJ SOLR
60.0000 mg | INTRAMUSCULAR | Status: DC
  Administered 2017-07-02: 60 mg via INTRAVENOUS
  Filled 2017-07-01: qty 2

## 2017-07-01 MED ORDER — ACETAMINOPHEN 650 MG RE SUPP: 650.0000 mg | Freq: Four times a day (QID) | RECTAL | Status: DC | PRN

## 2017-07-01 NOTE — Patient Instructions (Addendum)
Thank you for coming to the office today.  Please go directly to [facility] ED hospital for evaluation and management of breathing, COPD.  [facility] [address], [place], [place] Phone: [phone]  Keep [facility] apt until we schedule sooner apt  Please schedule a Follow-up Appointment to: Return in about 1 month (around 07/29/2017) for Follow-up after Pulmnology / COPD.  If you have any other questions or concerns, please feel free to call the office or send a message through [place]. You may also schedule an earlier appointment if necessary.  Additionally, you may be receiving a survey about your experience at our office within a few days to 1 week by e-mail or mail. We value your feedback.  Nobie Putnam, DO [place]

## 2017-07-01 NOTE — Progress Notes (Addendum)
Subjective:    Patient ID: Luke [name], male    DOB: 1950/09/30, 67 y.o.   MRN: [id number]  Luke [name] is a 67 y.o. male presenting on 07/01/2017 for Establish Care (SOB, chills ); COPD; Shortness of Breath; and Cough  New patient to office today, he is transferring care from [facility] Dr Baldemar Lenis due to location here closer to his home for convenience. Today is new visit to establish care, however, he has acute medical complaint today with presenting acute illness.  Patient provides most of history, he is accompanied by his sister, [name], who provides some history.  HPI  ACUTE COPD EXACERBATION / DYSPNEA / PRODUCTIVE COUGH / History of Fever/Chills - Reported problem today has been present for past 2 months with significant worsening acutely, report onset originally 2 months ago with URI bronchitis sick symptoms, he did not seek medical care for 3-4 weeks, then he went to [facility], was treated with what he thinks was Levaquin 750mg  antibiotic and had Chest X-ray that was negative for pneumonia. Also treated with Prednisone for presumed COPD. He does not have any paper records from this visit and it is not on EHR. He improved, but did not resolve symptoms. - Now 1 week ago recurrence of same problem again with persistent dyspnea and productive cough, also now with fevers and chills by report, he went back to [facility], was treated with Duoneb treatment, and given home nebulizer to use, and scheduled with [facility] for new patient evaluation on 08/17/17 - He is here today to establish as new patient because he is hoping to get to see Lung Doctors sooner than waiting 6 weeks - He endorses history of COPD, but states he was told it was only mild - Active smoker, cigarettes, see below, < 0.5 pack per day  Additional history:  Followed by [facility] for - 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 - They have followed low dose CT lung screening for him given his history and tobacco use. Results have shown an indeterminate mass or opacity of R lower lobe in 01/2016, they have followed this in past with imaging. - His last visit with Oncology was 04/2016, he has followed surveillance  Other PMH listed below - additionally he has HTN, HLD, and history of chest pain, followed by Dr Ubaldo Glassing ([facility]) and [facility] as well.  Health Maintenance: Will review chart and update at future appointment, not focus of visit due to acute illness  Depression screen [facility] 2/9 07/01/2017  Decreased Interest 0  Down, Depressed, Hopeless 0  PHQ - 2 Score 0    Past Medical History:  Diagnosis Date  . Anxiety   . Arthritis   . Carcinoma metastatic to lymph node ([place])   . Cataract    right  . COPD (chronic obstructive pulmonary disease) ([place])   . Depression   . GERD (gastroesophageal reflux disease)   . Hoarseness of voice   . Hypertension   . Personal history of tobacco use, presenting hazards to health February of 2001  . Thyroid disease    Past Surgical History:  Procedure Laterality Date  . COLONOSCOPY    . COLONOSCOPY WITH PROPOFOL N/A 11/29/2016   Procedure: COLONOSCOPY WITH PROPOFOL;  Surgeon: Manya Silvas, MD;  Location: [facility] ENDOSCOPY;  Service: Endoscopy;  Laterality: N/A;  .  ESOPHAGOGASTRODUODENOSCOPY (EGD) WITH PROPOFOL N/A 11/29/2016   Procedure: ESOPHAGOGASTRODUODENOSCOPY (EGD) WITH PROPOFOL;  Surgeon: Manya Silvas, MD;  Location: [facility] ENDOSCOPY;  Service: Endoscopy;  Laterality: N/A;  . NECK SURGERY     status post diagnosis of cancer with chemo and radiation  . TONSILLECTOMY     Social History   Socioeconomic History  . Marital status: Single    Spouse name: Not on file  . Number of children: Not on file  . Years of education: [employer]  . Highest education level: High school  graduate  Occupational History  . Not on file  Social Needs  . Financial resource strain: Not on file  . Food insecurity:    Worry: Not on file    Inability: Not on file  . Transportation needs:    Medical: Not on file    Non-medical: Not on file  Tobacco Use  . Smoking status: Current Every Day Smoker    Packs/day: 2.00    Years: 40.00    Pack years: 80.00    Types: Cigarettes  . Smokeless tobacco: Never Used  Substance and Sexual Activity  . Alcohol use: No  . Drug use: No  . Sexual activity: Not on file  Lifestyle  . Physical activity:    Days per week: Not on file    Minutes per session: Not on file  . Stress: Not on file  Relationships  . Social connections:    Talks on phone: Not on file    Gets together: Not on file    Attends religious service: Not on file    Active member of club or organization: Not on file    Attends meetings of clubs or organizations: Not on file    Relationship status: Not on file  . Intimate partner violence:    Fear of current or ex partner: Not on file    Emotionally abused: Not on file    Physically abused: Not on file    Forced sexual activity: Not on file  Other Topics Concern  . Not on file  Social History Narrative  . Not on file   Family History  Problem Relation Age of Onset  . Breast cancer Mother   . Heart disease Mother   . Cancer Mother        breast, back, bladder  . Heart attack Father   . Heart disease Father    Current Outpatient Medications on File Prior to Visit  Medication Sig  . albuterol (PROVENTIL HFA;VENTOLIN HFA) 108 (90 Base) MCG/ACT inhaler Inhale 2 puffs every 6 (six) hours as needed into the lungs for wheezing or shortness of breath.  Marland Kitchen ibuprofen (ADVIL,MOTRIN) 800 MG tablet TAKE 1 TABLET BY MOUTH EVERY 6 HOURS AS NEEDED  . levothyroxine (SYNTHROID, LEVOTHROID) 88 MCG tablet TAKE 1 TABLET BY MOUTH ONCE A DAY ON AN EMPTY STOMACH.  . meloxicam (MOBIC) 15 MG tablet TK 1 T PO QD  . Multiple  Vitamins-Minerals (MULTIVITAMIN PO) Take by mouth.  Marland Kitchen omeprazole (PRILOSEC) 20 MG capsule TAKE ONE CAPSULE BY MOUTH EVERY DAY FOR HEART BURN   No current facility-administered medications on file prior to visit.     Review of Systems  Constitutional: Positive for activity change, chills, fatigue and fever. Negative for appetite change.  HENT: Negative for congestion, sinus pressure and sneezing.   Respiratory: Positive for cough, shortness of breath and wheezing. Negative for apnea and chest tightness.   Cardiovascular: Negative for chest pain and leg swelling.  Endocrine: Negative for cold intolerance.  Genitourinary: Negative for frequency.  Musculoskeletal: Negative for arthralgias.  Skin: Negative for rash.  Allergic/Immunologic: Negative for environmental allergies.  Neurological: Negative for dizziness.  Psychiatric/Behavioral: The patient is not nervous/anxious.    Per HPI unless specifically indicated above     Objective:    BP 95/60   Pulse 78   Temp 98.5 F (36.9 C) (Oral)   Resp 16   Ht 6\' 1"  (1.854 m)   Wt 197 lb 6.4 oz (89.5 kg)   SpO2 96%   BMI 26.04 kg/m   Wt Readings from Last 3 Encounters:  07/01/17 195 lb (88.5 kg)  07/01/17 197 lb 6.4 oz (89.5 kg)  11/29/16 195 lb (88.5 kg)    Physical Exam  Constitutional: He is oriented to person, place, and time. He appears well-developed and well-nourished. No distress.  Currently ill appearing, uncomfortable due to frequent cough and short of breath, cooperative  HENT:  Head: Normocephalic and atraumatic.  Mouth/Throat: Oropharynx is clear and moist.  Frontal / maxillary sinuses non-tender. Nares patent without purulence or edema. Bilateral TMs clear without erythema, effusion or bulging. Oropharynx with some mild dry mucus mem without erythema, exudates, edema or asymmetry.  Eyes: Conjunctivae are normal. Right eye exhibits no discharge. Left eye exhibits no discharge.  Neck: Neck supple.  Cardiovascular:  Normal rate, regular rhythm, normal heart sounds and intact distal pulses.  No murmur heard. Pulmonary/Chest: No respiratory distress. He has decreased breath sounds. He has wheezes (diffusely). He has rhonchi.  Frequent coughing spells, reduced air movement.  Musculoskeletal: Normal range of motion. He exhibits no edema.  Neurological: He is alert and oriented to person, place, and time.  Skin: Skin is warm and dry. No rash noted. He is not diaphoretic. No erythema.  Psychiatric: His behavior is normal.  Good eye contact, normal speech and thoughts  Nursing note and vitals reviewed.  I have personally reviewed the radiology report from [place] 04/12/16 Chest CT  CLINICAL DATA:  Right lower lobe mass  EXAM: CT CHEST WITH CONTRAST  TECHNIQUE: Multidetector CT imaging of the chest was performed during intravenous contrast administration.  CONTRAST:  96mL ISOVUE-300 IOPAMIDOL (ISOVUE-300) INJECTION 61%  COMPARISON:  PET-CT February 19, 2016  FINDINGS: Cardiovascular: Ascending thoracic aortic diameter measures 4.2 x 4.0 cm, measured on axial slice 62 series 2. There is no thoracic aortic dissection. Visualized great vessels appear unremarkable. There are scattered foci of atherosclerotic calcification in the aorta. There are foci of coronary artery calcification. Pericardium is not appreciably thickened. No major vessel pulmonary embolus evident.  Mediastinum/Nodes: Thyroid appears unremarkable. There are scattered subcentimeter mediastinal lymph nodes. In the sub- carinal region, there is a lymph node measuring 1.5 x 0.8 cm. This lymph node is stable compared to recent PET study. There is a small hiatal hernia.  Lungs/Pleura: There is mild scarring in the apices. There has been near complete clearing of opacity from the right lower lobe in the area of previous spiculation. A small amount of residual atelectatic change remains in this area. No masslike area remains  in the right lower lobe. There is no new parenchymal lung opacity elsewhere. No pleural thickening or pleural effusion is evident.  Upper Abdomen: There is mild bilateral adrenal hypertrophy. There is cholelithiasis. Visualized upper abdominal structures otherwise appear unremarkable.  Musculoskeletal: There are areas of degenerative change in the thoracic spine. No blastic or lytic bone lesions.  IMPRESSION: Near complete clearing of irregular opacity in the right  lower lobe consistent with resolving pneumonia. No parenchymal lung mass evident. No adenopathy. The sub- carinal lymph node described above is unchanged and did not show abnormal metabolic activity on the recent PET study.  Prominence of the ascending thoracic aorta noted with a measured value of 4.2 x 4.0 cm. Recommend annual imaging followup by CTA or MRA. This recommendation follows 2010 ACCF/AHA/AATS/ACR/ASA/SCA/SCAI/SIR/STS/SVM Guidelines for the Diagnosis and Management of Patients with Thoracic Aortic Disease. Circulation. 2010; 121: V564-P329. No dissection. There are foci of coronary artery calcification noted.  Cholelithiasis without gallbladder wall thickening.  Mild adrenal hypertrophy bilaterally.   Electronically Signed   By: Lowella Grip III M.D.   On: 04/12/2016 08:54     Assessment & Plan:   Problem List Items Addressed This Visit    None    Visit Diagnoses    COPD exacerbation ([place])    -  Primary   Encounter to establish care with new doctor        Will review outside record from prior PCP    Significant concern today with new patient, no baseline for his respiratory or medical status and he is establishing care today after worsening over 2 months dyspnea from 2 urgent care visits, failed outpatient oral antibiotics, prednisone, chest-xray negative and nebulizer treatments. - Currently on exam, increased resp effort and diffuse wheezing, with history seems consistent with  moderate AECOPD and questionable if underlying pneumonia with fever and chills reported - No hypoxia (96% on RA), afebrile, no recent hospitalization  Plan: 1. Recommended that he go directly to [facility] ED for further work-up and evaluation - will need chest imaging, possibly may need advanced imaging with Chest CT given prior abnormalities on prior CT and worsening presentation now, with recent outpatient Chest X_ray normal - also failed oral antibiotics, do not know which one, he may require IV antibiotics again if not improving. - [facility] staff to call nurse triage at ED to review case and give them update, patient agrees to travel by personal vehicle w/ sister - He may continue current nebulizer therapy - Ultimately regardless of current acute flare, he will need to establish with Pulmonology - he may keep [facility] apt 08/17/17 or we we can send more urgent referral to [place] locally for further work-up ideally when he is breathing better for PFTs and advanced testing if need, may need maintenance therapy in addition to nebulizers - will wait on referral until after ED visit and/or hospital evaluation is complete to determine his course  Also to re-establish with [facility] for continued surveillance as they have recommended   Follow-up in 4 weeks  No orders of the defined types were placed in this encounter.     Follow up plan: Return in about 1 month (around 07/29/2017) for Follow-up after Pulmnology / COPD.  Nobie Putnam, [facility] 07/01/2017, 5:59 PM

## 2017-07-01 NOTE — ED Triage Notes (Signed)
SOB X 2 days. States he went to PCP today and referred to ER for chest xray. Pt reports nonproductive cough and SOB X 2 days. Ambulates easily.

## 2017-07-01 NOTE — ED Provider Notes (Signed)
[facility] Emergency Department Provider Note       Time seen: ----------------------------------------- 6:54 PM on 07/01/2017 -----------------------------------------   I have reviewed the triage vital signs and the nursing notes.  HISTORY   Chief Complaint Shortness of Breath    HPI Luke [name] is a 67 y.o. male with a history of anxiety, arthritis, metastatic carcinoma, COPD, depression, GERD who presents to the ED for worsening shortness of breath for the past 2 days.  Patient was seen by his doctor 2 weeks ago and placed on steroids and antibiotics for respiratory infection.  Patient states he feels terrible, for the past 3 days he has had shaking chills with worsening trouble breathing.  He tried his albuterol inhaler at home as well as nebulizer treatment without significant improvement.  He was sent to the ER for chest x-ray and further evaluation.  Past Medical History:  Diagnosis Date  . Anxiety   . Arthritis   . Carcinoma metastatic to lymph node ([place])   . Cataract    right  . COPD (chronic obstructive pulmonary disease) ([place])   . Depression   . GERD (gastroesophageal reflux disease)   . Hoarseness of voice   . Hypertension   . Personal history of tobacco use, presenting hazards to health February of 2001  . Thyroid disease     Patient Active Problem List   Diagnosis Date Noted  . Cancer of head and neck ([place]) 10/09/2015  . Personal history of tobacco use, presenting hazards to health 09/23/2014  . Pure hypercholesterolemia 09/06/2014  . Tobacco abuse 09/06/2014  . Benign essential hypertension 06/15/2013  . History of lymphoma 06/15/2013  . Esophageal reflux 06/08/2013  . Hypothyroidism 06/08/2013    Past Surgical History:  Procedure Laterality Date  . COLONOSCOPY    . COLONOSCOPY WITH PROPOFOL N/A 11/29/2016   Procedure: COLONOSCOPY WITH PROPOFOL;  Surgeon: Manya Silvas, MD;  Location[facility] ENDOSCOPY;  Service:  Endoscopy;  Laterality: N/A;  . ESOPHAGOGASTRODUODENOSCOPY (EGD) WITH PROPOFOL N/A 11/29/2016   Procedure: ESOPHAGOGASTRODUODENOSCOPY (EGD) WITH PROPOFOL;  Surgeon: Manya Silvas, MD;  Location: [facility] ENDOSCOPY;  Service: Endoscopy;  Laterality: N/A;  . NECK SURGERY     status post diagnosis of cancer with chemo and radiation  . TONSILLECTOMY      Allergies Patient has no known allergies.  Social History Social History   Tobacco Use  . Smoking status: Current Every Day Smoker    Packs/day: 2.00    Years: 40.00    Pack years: 80.00    Types: Cigarettes  . Smokeless tobacco: Never Used  Substance Use Topics  . Alcohol use: No  . Drug use: No   Review of Systems Constitutional: Negative for fever.  Positive for chills Cardiovascular: Negative for chest pain. Respiratory: Positive for shortness of breath and cough Gastrointestinal: Negative for abdominal pain, vomiting and diarrhea. Musculoskeletal: Negative for back pain. Skin: Negative for rash. Neurological: Negative for headaches, focal weakness or numbness.  All systems negative/normal/unremarkable except as stated in the HPI  ____________________________________________   PHYSICAL EXAM:  VITAL SIGNS: ED Triage Vitals [07/01/17 1626]  Enc Vitals Group     BP (!) 156/99     Pulse Rate 79     Resp 18     Temp 98.6 F (37 C)     Temp Source Oral     SpO2 96 %     Weight 195 lb (88.5 kg)     Height 6\' 1"  (1.854 m)  Head Circumference      Peak Flow      Pain Score 0     Pain Loc      Pain Edu?      Excl. in [place]?    Constitutional: Alert and oriented. Well appearing and in no distress. Eyes: Conjunctivae are normal. Normal extraocular movements. ENT   Head: Normocephalic and atraumatic.   Nose: No congestion/rhinnorhea.   Mouth/Throat: Mucous membranes are moist.   Neck: No stridor. Cardiovascular: Normal rate, regular rhythm. No murmurs, rubs, or gallops. Respiratory: Diffuse wheezing  with rhonchi bilaterally.  Prolonged expiration Gastrointestinal: Soft and nontender. Normal bowel sounds Musculoskeletal: Nontender with normal range of motion in extremities. No lower extremity tenderness nor edema. Neurologic:  Normal speech and language. No gross focal neurologic deficits are appreciated.  Skin:  Skin is warm, dry and intact. No rash noted. Psychiatric: Mood and affect are normal. Speech and behavior are normal.  ____________________________________________  EKG: Interpreted by me.  Sinus rhythm rate of 75 bpm, normal PR interval, normal QRS, normal QT.  ____________________________________________  ED COURSE:  As part of my medical decision making, I reviewed the following data within the [place] History obtained from family if available, nursing notes, old chart and ekg, as well as notes from prior ED visits. Patient presented for shortness of breath and likely COPD exacerbation, we will assess with labs and imaging as indicated at this time.   Procedures ____________________________________________   LABS (pertinent positives/negatives)  Labs Reviewed  CBC - Abnormal; Notable for the following components:      Result Value   WBC 16.0 (*)    RBC 4.19 (*)    HCT 38.9 (*)    All other components within normal limits  BASIC METABOLIC PANEL - Abnormal; Notable for the following components:   BUN 22 (*)    Calcium 8.6 (*)    All other components within normal limits  CULTURE, BLOOD (ROUTINE X 2)  CULTURE, BLOOD (ROUTINE X 2)  BRAIN NATRIURETIC PEPTIDE  TROPONIN I  BLOOD GAS, VENOUS  LACTIC ACID, PLASMA    RADIOLOGY Images were viewed by me  Chest x-ray IMPRESSION: Number diffuse bilateral pulmonary interstitial prominence. Pneumonitis could present in this fashion.  2. Previously identified ill-defined opacity in the right lung base noted on prior PET-CT has cleared. ____________________________________________  DIFFERENTIAL  DIAGNOSIS   COPD, pneumonia, pneumothorax, PE, URI  FINAL ASSESSMENT AND PLAN  COPD exacerbation   Plan: The patient had presented for worsening shortness of breath as well as shaking chills. Patient's labs did reveal leukocytosis. Patient's imaging does not reveal any acute process.  Patient still has significant shortness of breath and wheezing bilaterally.  He has received duo nebs and steroids here with only mild improvement and was recently treated similarly.  I will discuss with the hospitalist for admission.   Laurence Aly, MD   Note: This note was generated in part or whole with voice recognition software. Voice recognition is usually quite accurate but there are transcription errors that can and very often do occur. I apologize for any typographical errors that were not detected and corrected.     Earleen Newport, MD 07/01/17 [phone]

## 2017-07-01 NOTE — H&P (Signed)
[facility] NAME: Luke [name]    MR#:  [id number]  DATE OF BIRTH:  06-25-1950  DATE OF ADMISSION:  07/01/2017  PRIMARY CARE PHYSICIAN: Olin Hauser, DO   REQUESTING/REFERRING PHYSICIAN: Dr. Jimmye Norman  CHIEF COMPLAINT:   Chief Complaint  Patient presents with  . Shortness of Breath   HISTORY OF PRESENT ILLNESS:  Luke [name]  is a 67 y.o. male with a known history of COPD, tobacco use, Gerd, depression, history of metastatic carcinoma of neck lymph node with radiation many years back and chronic dysphagia presents to the emergency room due to worsening cough and wheezing. Patient has had cough and wheezing with chest congestion for two months with his last course of antibiotic two weeks back. Has also used prednisone. Today he received IV steroids and multiple nebulizers and continues to have wheezing and is being admitted. Patient has choking with liquids and has had this chronically. His last swallow evaluation was many years back. Patient tells me that he feels some of the food that he eats is getting into his lungs.  PAST MEDICAL HISTORY:   Past Medical History:  Diagnosis Date  . Anxiety   . Arthritis   . Carcinoma metastatic to lymph node ([place])   . Cataract    right  . COPD (chronic obstructive pulmonary disease) ([place])   . Depression   . GERD (gastroesophageal reflux disease)   . Hoarseness of voice   . Hypertension   . Personal history of tobacco use, presenting hazards to health February of 2001  . Thyroid disease     PAST SURGICAL HISTORY:   Past Surgical History:  Procedure Laterality Date  . COLONOSCOPY    . COLONOSCOPY WITH PROPOFOL N/A 11/29/2016   Procedure: COLONOSCOPY WITH PROPOFOL;  Surgeon: Manya Silvas, MD;  Location: [facility] ENDOSCOPY;  Service: Endoscopy;  Laterality: N/A;  . ESOPHAGOGASTRODUODENOSCOPY (EGD) WITH PROPOFOL N/A 11/29/2016   Procedure: ESOPHAGOGASTRODUODENOSCOPY (EGD) WITH  PROPOFOL;  Surgeon: Manya Silvas, MD;  Location: [facility] ENDOSCOPY;  Service: Endoscopy;  Laterality: N/A;  . NECK SURGERY     status post diagnosis of cancer with chemo and radiation  . TONSILLECTOMY      SOCIAL HISTORY:   Social History   Tobacco Use  . Smoking status: Current Every Day Smoker    Packs/day: 2.00    Years: 40.00    Pack years: 80.00    Types: Cigarettes  . Smokeless tobacco: Never Used  Substance Use Topics  . Alcohol use: No   FAMILY HISTORY:   Family History  Problem Relation Age of Onset  . Breast cancer Mother   . Heart disease Mother   . Cancer Mother        breast, back, bladder  . Heart attack Father   . Heart disease Father    DRUG ALLERGIES:  No Known Allergies  REVIEW OF SYSTEMS:   Review of Systems  Constitutional: Positive for malaise/fatigue. Negative for chills, fever and weight loss.  HENT: Negative for hearing loss and nosebleeds.   Eyes: Negative for blurred vision, double vision and pain.  Respiratory: Positive for cough, sputum production, shortness of breath and wheezing. Negative for hemoptysis.   Cardiovascular: Negative for chest pain, palpitations, orthopnea and leg swelling.  Gastrointestinal: Negative for abdominal pain, constipation, diarrhea, nausea and vomiting.  Genitourinary: Negative for dysuria and hematuria.  Musculoskeletal: Negative for back pain, falls and myalgias.  Skin: Negative for rash.  Neurological: Negative for  dizziness, tremors, sensory change, speech change, focal weakness, seizures and headaches.  Endo/Heme/Allergies: Does not bruise/bleed easily.  Psychiatric/Behavioral: Negative for depression and memory loss. The patient is not nervous/anxious.    MEDICATIONS AT HOME:   Prior to Admission medications   Medication Sig Start Date End Date Taking? Authorizing Provider  levothyroxine (SYNTHROID, LEVOTHROID) 88 MCG tablet TAKE 1 TABLET BY MOUTH ONCE A DAY ON AN EMPTY STOMACH. 07/17/14  Yes [provider]  meloxicam (MOBIC) 15 MG tablet Take 1 tablet by mouth daily 09/14/14  Yes [provider]  Multiple Vitamins-Minerals (MULTIVITAMIN PO) Take by mouth.   Yes [provider]  albuterol (PROVENTIL HFA;VENTOLIN HFA) 108 (90 Base) MCG/ACT inhaler Inhale 2 puffs every 6 (six) hours as needed into the lungs for wheezing or shortness of breath.    [provider]  omeprazole (PRILOSEC) 20 MG capsule TAKE ONE CAPSULE BY MOUTH EVERY DAY FOR HEART BURN 10/30/13   [provider]   VITAL SIGNS:  Blood pressure 125/88, pulse 98, temperature 98.6 F (37 C), temperature source Oral, resp. rate 19, height 6\' 1"  (1.854 m), weight 88.5 kg (195 lb), SpO2 94 %.  PHYSICAL EXAMINATION:  Physical Exam  GENERAL:  67 y.o.-year-old patient lying in the bed with no acute distress.  EYES: Pupils equal, round, reactive to light and accommodation. No scleral icterus. Extraocular muscles intact.  HEENT: Head atraumatic, normocephalic. Oropharynx and nasopharynx clear. No oropharyngeal erythema, moist oral mucosa  NECK:  Supple, no jugular venous distention. No thyroid enlargement, no tenderness.  LUNGS: decreased data entry and bilateral wheezing. CARDIOVASCULAR: S1, S2 normal. No murmurs, rubs, or gallops.  ABDOMEN: Soft, nontender, nondistended. Bowel sounds present. No organomegaly or mass.  EXTREMITIES: No pedal edema, cyanosis, or clubbing. + 2 pedal & radial pulses b/l.   NEUROLOGIC: Cranial nerves II through XII are intact. No focal Motor or sensory deficits appreciated b/l PSYCHIATRIC: The patient is alert and oriented x 3. Good affect.  SKIN: No obvious rash, lesion, or ulcer.   LABORATORY PANEL:   CBC Recent Labs  Lab 07/01/17 1628  WBC 16.0*  HGB 13.2  HCT 38.9*  PLT 272   ------------------------------------------------------------------------------------------------------------------  Chemistries  Recent Labs  Lab 07/01/17 1628  NA 138  K  4.2  CL 103  CO2 28  GLUCOSE 96  BUN 22*  CREATININE 1.22  CALCIUM 8.6*   ------------------------------------------------------------------------------------------------------------------  Cardiac Enzymes Recent Labs  Lab 07/01/17 1837  TROPONINI <0.03   ------------------------------------------------------------------------------------------------------------------  RADIOLOGY:  Dg Chest 2 View  Result Date: 07/01/2017 CLINICAL DATA:  Shortness of breath. EXAM: CHEST - 2 VIEW COMPARISON:  CT 04/12/2016. PET-CT 02/19/2016. Chest x-ray 08/20/2013. FINDINGS: Mediastinum and hilar structures are normal. Heart size stable. Diffuse bilateral pulmonary interstitial prominence. Pneumonitis could present in this fashion. No pleural effusion or pneumothorax. Previously identified focal infiltrate in the right lung base on prior PET-CT has cleared. No pleural effusion or pneumothorax. Degenerative change thoracic spine. IMPRESSION: Number diffuse bilateral pulmonary interstitial prominence. Pneumonitis could present in this fashion. 2. Previously identified ill-defined opacity in the right lung base noted on prior PET-CT has cleared. Electronically Signed   By: Marcello Moores  Register   On: 07/01/2017 16:54     IMPRESSION AND PLAN:   *Bilateral aspiration pneumonia. Started Levaquin and Flagyl. Associated COPD exacerbation. Start IV steroids and nebulizers. Due to prolonged symptoms will consult pulmonology.  *Chronic dysphagia. Seems to have worsened. At this time will start patient on dysphagia one diet with nectar thickened liquids.  Will have speech therapy consult for swallow evaluation in the morning.  *Tobacco abuse. Counseled patient to quit smoking. Time spent > 3 minutes  All the records are reviewed and case discussed with ED provider. Management plans discussed with the patient, family and they are in agreement.  CODE STATUS: Do not resuscitate  TOTAL TIME TAKING CARE OF THIS  PATIENT: 40 minutes.  Leia Alf Foch Rosenwald M.D on 07/01/2017 at 8:51 PM  Between 7am to 6pm - Pager - [phone]  After 6pm go to [email or web address] - password [email or web address] [facility]  Office  [phone]  CC: Primary care physician; Olin Hauser, DO  Note: This dictation was prepared with Dragon dictation along with smaller phrase technology. Any transcriptional errors that result from this process are unintentional.

## 2017-07-01 NOTE — ED Notes (Signed)
First Nurse Note: Pt ambulatory into ED c/o trouble breathing. Pt is in NAD at this time.

## 2017-07-01 NOTE — Progress Notes (Signed)
Purpose of Encounter aspiration pneumonia, code status discussion  Parties in Attendance patient and his sister who is his healthcare power of attorney  Patients Decisional capacity patient is alert and oriented. Able to make medical decisions.  Patient being admitted for aspiration pneumonia. Has chronic dysphagia due to radiation for metastatic lymph node in the past.  Discussed code status. Patient wants sister bedside to be his healthcare power of attorney. He does not want to be intubated or have CPR. Do not resuscitate and cannot intubate discussed and orders entered.  Do not resuscitate and do not intubate  Time spent - 18 minutes

## 2017-07-01 NOTE — Addendum Note (Signed)
Addended by: Olin Hauser on: 07/01/2017 05:58 PM   Modules accepted: Level of Service

## 2017-07-02 LAB — BASIC METABOLIC PANEL
Anion gap: 10 (ref 5–15)
BUN: 19 mg/dL (ref 6–20)
CALCIUM: 8.9 mg/dL (ref 8.9–10.3)
CO2: 25 mmol/L (ref 22–32)
Chloride: 103 mmol/L (ref 101–111)
Creatinine, Ser: 1.09 mg/dL (ref 0.61–1.24)
Glucose, Bld: 247 mg/dL — ABNORMAL HIGH (ref 65–99)
Potassium: 3.8 mmol/L (ref 3.5–5.1)
SODIUM: 138 mmol/L (ref 135–145)

## 2017-07-02 LAB — CBC
HCT: 39 % — ABNORMAL LOW (ref 40.0–52.0)
Hemoglobin: 13.2 g/dL (ref 13.0–18.0)
MCH: 31.4 pg (ref 26.0–34.0)
MCHC: 33.8 g/dL (ref 32.0–36.0)
MCV: 92.9 fL (ref 80.0–100.0)
PLATELETS: 255 10*3/uL (ref 150–440)
RBC: 4.19 MIL/uL — AB (ref 4.40–5.90)
RDW: 13.3 % (ref 11.5–14.5)
WBC: 10.7 10*3/uL — AB (ref 3.8–10.6)

## 2017-07-02 MED ORDER — GUAIFENESIN-DM 100-10 MG/5ML PO SYRP
5.0000 mL | ORAL_SOLUTION | ORAL | Status: DC | PRN
  Filled 2017-07-02: qty 5

## 2017-07-02 MED ORDER — IPRATROPIUM-ALBUTEROL 0.5-2.5 (3) MG/3ML IN SOLN
3.0000 mL | Freq: Three times a day (TID) | RESPIRATORY_TRACT | Status: DC
  Administered 2017-07-03: 3 mL via RESPIRATORY_TRACT
  Filled 2017-07-02: qty 3

## 2017-07-02 MED ORDER — SODIUM CHLORIDE 0.9% FLUSH
3.0000 mL | Freq: Two times a day (BID) | INTRAVENOUS | Status: DC
  Administered 2017-07-02 – 2017-07-03 (×4): 3 mL via INTRAVENOUS

## 2017-07-02 NOTE — Evaluation (Signed)
Clinical/Bedside Swallow Evaluation Patient Details  Name: Luke [name] MRN: [id number] Date of Birth: 01/02/1951  Today's Date: 07/02/2017 Time: SLP Start Time (ACUTE ONLY): 0931 SLP Stop Time (ACUTE ONLY): 1022 SLP Time Calculation (min) (ACUTE ONLY): 51 min  Past Medical History:  Past Medical History:  Diagnosis Date  . Anxiety   . Arthritis   . Carcinoma metastatic to lymph node ([place])   . Cataract    right  . COPD (chronic obstructive pulmonary disease) ([place])   . Depression   . GERD (gastroesophageal reflux disease)   . Hoarseness of voice   . Hypertension   . Personal history of tobacco use, presenting hazards to health February of 2001  . Thyroid disease    Past Surgical History:  Past Surgical History:  Procedure Laterality Date  . COLONOSCOPY    . COLONOSCOPY WITH PROPOFOL N/A 11/29/2016   Procedure: COLONOSCOPY WITH PROPOFOL;  Surgeon: Manya Silvas, MD;  Location: [facility] ENDOSCOPY;  Service: Endoscopy;  Laterality: N/A;  . ESOPHAGOGASTRODUODENOSCOPY (EGD) WITH PROPOFOL N/A 11/29/2016   Procedure: ESOPHAGOGASTRODUODENOSCOPY (EGD) WITH PROPOFOL;  Surgeon: Manya Silvas, MD;  Location: [facility] ENDOSCOPY;  Service: Endoscopy;  Laterality: N/A;  . NECK SURGERY     status post diagnosis of cancer with chemo and radiation  . TONSILLECTOMY     HPI:  Luke [name]  is a 67 y.o. male with a known history of COPD, tobacco use, Gerd, depression, history of metastatic carcinoma of neck lymph node with radiation many years back and chronic dysphagia presents to the emergency room due to worsening cough and wheezing. Patient has had cough and wheezing with chest congestion for two months with his last course of antibiotic two weeks back. Has also used prednisone. Today he received IV steroids and multiple nebulizers and continues to have wheezing and is being admitted.   Patient reports radiation treatment to his neck, "years back".  He reports he had an MBS around 7 years ago  and was told that "everything is fine".  Patient had an upper endoscopy in November 2018 showing a mild Schatzki ring.  Patient reports that his swallowing was improved "for about a week".  Patient describes his difficulty swallowing as foods not moving through his esophagus and at times he has to regurgitate.  He denies any difficulty with liquids.     Assessment / Plan / Recommendation Clinical Impression  The patient is presenting with functional oropharyngeal swallowing.  He is describing esophageal dysphagia primarily, stating that the dilation he got 7 months ago was effective for only a week.  He is also describing poor esophageal motility and that coughing occurs after eats vs as he eats.  Recommend dysphagia 3 diet with thin liquids.  Recommend GI consult.  SLP will follow and is available for questions/concerns regarding oropharyngeal swallow function. SLP Visit Diagnosis: Dysphagia, pharyngoesophageal phase (R13.14)    Aspiration Risk       Diet Recommendation Dysphagia 3 (Mech soft);Thin liquid(Patient with significant xerostomia)   Liquid Administration via: Cup Medication Administration: Whole meds with liquid Supervision: Patient able to self feed    Other  Recommendations Recommended Consults: Consider GI evaluation   Follow up Recommendations        Frequency and Duration            Prognosis Prognosis for Safe Diet Advancement: Good      Swallow Study   General Date of Onset: 07/01/17 HPI: Luke [name]  is a 67 y.o. male with a known  history of COPD, tobacco use, Gerd, depression, history of metastatic carcinoma of neck lymph node with radiation many years back and chronic dysphagia presents to the emergency room due to worsening cough and wheezing. Patient has had cough and wheezing with chest congestion for two months with his last course of antibiotic two weeks back. Has also used prednisone. Today he received IV steroids and multiple nebulizers and continues to  have wheezing and is being admitted. Type of Study: Bedside Swallow Evaluation Previous Swallow Assessment: ("Years ago") Diet Prior to this Study: Regular    Oral/Motor/Sensory Function     Ice Chips     Thin Liquid Thin Liquid: Within functional limits Presentation: Cup;Self Fed    Nectar Thick     Honey Thick     Puree     Solid   GO   Solid: Within functional limits Presentation: Self Fed       Luke Sea, MS/CCC- SLP  Valetta Fuller, Susie 07/02/2017,10:22 AM

## 2017-07-02 NOTE — Progress Notes (Signed)
Pt reports feeling better with some cough with production of moderate to small amount thick tan sputum. Can force slight audible wheeze on expirations. Eating well. Vancomycin IV continued and oral metronidazole. Family in to visit.

## 2017-07-02 NOTE — Progress Notes (Signed)
Patient ID: Luke [name], male   DOB: 10/15/1950, 67 y.o.   MRN: [id number]  [employer] PROGRESS NOTE  CALVYN KURTZMAN MWN:[id number] DOB: 04/16/1950 DOA: 07/01/2017 PCP: Olin Hauser, DO  HPI/Subjective: Patient still hears rattling in his chest.  This is been going on for couple months.  Comes with shortness of breath and cough.  Has had trouble swallowing solid foods for a while.  Objective: Vitals:   07/02/17 0749 07/02/17 0906  BP:  106/70  Pulse:  99  Resp:  20  Temp:  98.3 F (36.8 C)  SpO2: 94% 92%    Intake/Output Summary (Last 24 hours) at 07/02/2017 1414 Last data filed at 07/02/2017 1407 Gross per 24 hour  Intake 360 ml  Output -  Net 360 ml   Filed Weights   07/01/17 1626 07/02/17 0500  Weight: 88.5 kg (195 lb) 88.5 kg (195 lb 1.6 oz)    ROS: Review of Systems  Constitutional: Negative for chills and fever.  Eyes: Negative for blurred vision.  Respiratory: Positive for cough, shortness of breath and wheezing.   Cardiovascular: Negative for chest pain.  Gastrointestinal: Negative for abdominal pain, constipation, diarrhea, nausea and vomiting.  Genitourinary: Negative for dysuria.  Musculoskeletal: Negative for joint pain.  Neurological: Negative for dizziness and headaches.   Exam: Physical Exam  Constitutional: He is oriented to person, place, and time.  HENT:  Nose: No mucosal edema.  Mouth/Throat: No oropharyngeal exudate or posterior oropharyngeal edema.  Eyes: Pupils are equal, round, and reactive to light. Conjunctivae, EOM and lids are normal.  Neck: No JVD present. Carotid bruit is not present. No edema present. No thyroid mass and no thyromegaly present.  Cardiovascular: S1 normal and S2 normal. Exam reveals no gallop.  No murmur heard. Pulses:      Dorsalis pedis pulses are 2+ on the right side, and 2+ on the left side.  Respiratory: No respiratory distress. He has decreased breath sounds in the right lower field and the left  lower field. He has no wheezes. He has rhonchi in the right lower field and the left lower field. He has no rales.  GI: Soft. Bowel sounds are normal. There is no tenderness.  Musculoskeletal:       Right ankle: He exhibits no swelling.       Left ankle: He exhibits no swelling.  Lymphadenopathy:    He has no cervical adenopathy.  Neurological: He is alert and oriented to person, place, and time. No cranial nerve deficit.  Skin: Skin is warm. No rash noted. Nails show no clubbing.  Psychiatric: He has a normal mood and affect.      Data Reviewed: Basic Metabolic Panel: Recent Labs  Lab 07/01/17 1628 07/02/17 0355  NA 138 138  K 4.2 3.8  CL 103 103  CO2 28 25  GLUCOSE 96 247*  BUN 22* 19  CREATININE 1.22 1.09  CALCIUM 8.6* 8.9   CBC: Recent Labs  Lab 07/01/17 1628 07/02/17 0355  WBC 16.0* 10.7*  HGB 13.2 13.2  HCT 38.9* 39.0*  MCV 92.7 92.9  PLT 272 255   Cardiac Enzymes: Recent Labs  Lab 07/01/17 1837  TROPONINI <0.03   BNP (last 3 results) Recent Labs    07/01/17 1837  BNP 105.0*     Recent Results (from the past 240 hour(s))  Blood culture (routine x 2)     Status: None (Preliminary result)   Collection Time: 07/01/17  6:37 PM  Result Value Ref Range  Status   Specimen Description BLOOD R AC  Final   Special Requests   Final    BOTTLES DRAWN AEROBIC AND ANAEROBIC Blood Culture adequate volume   Culture   Final    NO GROWTH < 12 HOURS Performed at [facility], [address] [place], [place] [address]    Report Status PENDING  Incomplete  Blood culture (routine x 2)     Status: None (Preliminary result)   Collection Time: 07/01/17  6:40 PM  Result Value Ref Range Status   Specimen Description BLOOD L AC  Final   Special Requests   Final    BOTTLES DRAWN AEROBIC AND ANAEROBIC Blood Culture results may not be optimal due to an excessive volume of blood received in culture bottles   Culture   Final    NO GROWTH < 12 HOURS Performed at  [facility], [address] [place], [place] [address]    Report Status PENDING  Incomplete     Studies: Dg Chest 2 View  Result Date: 07/01/2017 CLINICAL DATA:  Shortness of breath. EXAM: CHEST - 2 VIEW COMPARISON:  CT 04/12/2016. PET-CT 02/19/2016. Chest x-ray 08/20/2013. FINDINGS: Mediastinum and hilar structures are normal. Heart size stable. Diffuse bilateral pulmonary interstitial prominence. Pneumonitis could present in this fashion. No pleural effusion or pneumothorax. Previously identified focal infiltrate in the right lung base on prior PET-CT has cleared. No pleural effusion or pneumothorax. Degenerative change thoracic spine. IMPRESSION: Number diffuse bilateral pulmonary interstitial prominence. Pneumonitis could present in this fashion. 2. Previously identified ill-defined opacity in the right lung base noted on prior PET-CT has cleared. Electronically Signed   By: Marcello Moores  Register   On: 07/01/2017 16:54    Scheduled Meds: . enoxaparin (LOVENOX) injection  40 mg Subcutaneous Q24H  . ipratropium-albuterol  3 mL Nebulization Q6H  . levothyroxine  88 mcg Oral QAC breakfast  . methylPREDNISolone (SOLU-MEDROL) injection  60 mg Intravenous Q24H  . metroNIDAZOLE  500 mg Oral Q8H  . pantoprazole  40 mg Oral Daily  . sodium chloride flush  3 mL Intravenous Q12H   Continuous Infusions: . levofloxacin (LEVAQUIN) IV 750 mg (07/02/17 1327)    Assessment/Plan:  1. COPD exacerbation on Solu-Medrol nebulizer treatments 2. Infectious pneumonitis.  Continue antibiotics and nebulizer treatments. 3. Chronic dysphasia.  Diet advanced by speech therapy. 4. Hypothyroidism unspecified on levothyroxine 5. Relative hypotension stopped Norvasc  Code Status:     Code Status Orders  (From admission, onward)        Start     Ordered   07/01/17 2050  Do not attempt resuscitation (DNR)  Continuous    Question Answer Comment  In the event of cardiac or respiratory ARREST Do not  call a "code blue"   In the event of cardiac or respiratory ARREST Do not perform Intubation, CPR, defibrillation or ACLS   In the event of cardiac or respiratory ARREST Use medication by any route, position, wound care, and other measures to relive pain and suffering. May use oxygen, suction and manual treatment of airway obstruction as needed for comfort.      07/01/17 2050    Code Status History    This patient has a current code status but no historical code status.      Disposition Plan: Hopefully home tomorrow  Antibiotics:  Levaquin  Flagyl  Time spent: 28 minutes  [place]

## 2017-07-03 MED ORDER — FLUTICASONE-SALMETEROL 115-21 MCG/ACT IN AERO: 2.0000 | INHALATION_SPRAY | Freq: Two times a day (BID) | RESPIRATORY_TRACT | 0 refills | Status: DC

## 2017-07-03 MED ORDER — METRONIDAZOLE 250 MG PO TABS: 250.0000 mg | ORAL_TABLET | Freq: Three times a day (TID) | ORAL | 0 refills | Status: AC

## 2017-07-03 MED ORDER — PREDNISONE 20 MG PO TABS
40.0000 mg | ORAL_TABLET | Freq: Every day | ORAL | Status: DC
  Administered 2017-07-03: 09:00:00 40 mg via ORAL
  Filled 2017-07-03: qty 4

## 2017-07-03 MED ORDER — LEVOFLOXACIN 750 MG PO TABS
750.0000 mg | ORAL_TABLET | Freq: Every day | ORAL | Status: DC
  Administered 2017-07-03: 750 mg via ORAL
  Filled 2017-07-03: qty 1

## 2017-07-03 MED ORDER — LEVOFLOXACIN 750 MG PO TABS: 750.0000 mg | ORAL_TABLET | Freq: Every day | ORAL | 0 refills | Status: AC

## 2017-07-03 MED ORDER — TIOTROPIUM BROMIDE MONOHYDRATE 18 MCG IN CAPS: 18.0000 ug | ORAL_CAPSULE | Freq: Every day | RESPIRATORY_TRACT | 0 refills | Status: DC

## 2017-07-03 MED ORDER — ALBUTEROL SULFATE (2.5 MG/3ML) 0.083% IN NEBU: 2.5000 mg | INHALATION_SOLUTION | RESPIRATORY_TRACT | 0 refills | Status: DC | PRN

## 2017-07-03 MED ORDER — TIOTROPIUM BROMIDE MONOHYDRATE 18 MCG IN CAPS
18.0000 ug | ORAL_CAPSULE | Freq: Every day | RESPIRATORY_TRACT | Status: DC
  Administered 2017-07-03: 18 ug via RESPIRATORY_TRACT
  Filled 2017-07-03 (×2): qty 5

## 2017-07-03 MED ORDER — PREDNISONE 20 MG PO TABS: 40.0000 mg | ORAL_TABLET | Freq: Every day | ORAL | 0 refills | Status: AC

## 2017-07-03 NOTE — Progress Notes (Signed)
   07/03/17 0955  Clinical Encounter Type  Visited With Patient and family together  Visit Type Initial (OR: AD)  Referral From Nurse  Consult/Referral To Chaplain   [place] checked in with patient and family at bedside about AD.  Pt and family had copy of AD and said they had been educated last night and needed no further assistance.

## 2017-07-03 NOTE — Discharge Planning (Signed)
Patient IV removed.  Discharge papers given, explained and educated.  Informed of suggested FU appts and patient agreed to contact d/t being DC on weekend.  Scripts e-scribed to [employer].  Educated on aspiration precautions and importance of cough and deep breathing.  Once ready, will be wheeled to front and family transporting home via car.

## 2017-07-03 NOTE — Discharge Summary (Signed)
[facility] NAME: Luke [name]    MR#:  [id number]  DATE OF BIRTH:  1950-08-29  DATE OF ADMISSION:  07/01/2017 ADMITTING PHYSICIAN: Hillary Bow, MD  DATE OF DISCHARGE: 07/03/2017 10:59 AM  PRIMARY CARE PHYSICIAN: Olin Hauser, DO    ADMISSION DIAGNOSIS:  COPD exacerbation ([place]) [J44.1]  DISCHARGE DIAGNOSIS:  Active Problems:   Aspiration pneumonia ([place])   SECONDARY DIAGNOSIS:   Past Medical History:  Diagnosis Date  . Anxiety   . Arthritis   . Carcinoma metastatic to lymph node ([place])   . Cataract    right  . COPD (chronic obstructive pulmonary disease) ([place])   . Depression   . GERD (gastroesophageal reflux disease)   . Hoarseness of voice   . Hypertension   . Personal history of tobacco use, presenting hazards to health February of 2001  . Thyroid disease     HOSPITAL COURSE:   1.  COPD exacerbation.  Patient received Solu-Medrol while here.  Switch over to prednisone 40 mg daily for the next 2 days upon discharge.  Ordered Spiriva and Advair upon going home.  Refilled his nebulizers. 2.  Infection pneumonitis.  Continue antibiotics Levaquin and Flagyl. 3.  Chronic dysphasia.  Diet advanced to dysphagia 3 diet by speech therapy.  Advised the patient to chew up his food very well and then wash it down with a drink.  Refer to gastroenterology as outpatient since the patient never had an endoscopy. 4.  Hypothyroidism unspecified on levothyroxine 5.  Relative hypotension.  Norvasc stopped while was here. 6.  Tobacco abuse.  Patient counseled on admission by Dr. Darvin Neighbours about smoking cessation. 7.  Impaired fasting glucose secondary to steroids  DISCHARGE CONDITIONS:   Satisfactory  CONSULTS OBTAINED:  None  DRUG ALLERGIES:  No Known Allergies  DISCHARGE MEDICATIONS:   Allergies as of 07/03/2017   No Known Allergies     Medication List    TAKE these medications   albuterol 108 (90 Base) MCG/ACT  inhaler Commonly known as:  PROVENTIL HFA;VENTOLIN HFA Inhale 2 puffs every 6 (six) hours as needed into the lungs for wheezing or shortness of breath. What changed:  Another medication with the same name was added. Make sure you understand how and when to take each.   albuterol (2.5 MG/3ML) 0.083% nebulizer solution Commonly known as:  PROVENTIL Take 3 mLs (2.5 mg total) by nebulization every 2 (two) hours as needed for wheezing. Dx: copd exacerbation J44.1 What changed:  You were already taking a medication with the same name, and this prescription was added. Make sure you understand how and when to take each.   fluticasone-salmeterol 115-21 MCG/ACT inhaler Commonly known as:  ADVAIR HFA Inhale 2 puffs into the lungs 2 (two) times daily.   levofloxacin 750 MG tablet Commonly known as:  LEVAQUIN Take 1 tablet (750 mg total) by mouth daily for 2 days.   levothyroxine 88 MCG tablet Commonly known as:  SYNTHROID, LEVOTHROID TAKE 1 TABLET BY MOUTH ONCE A DAY ON AN EMPTY STOMACH.   meloxicam 15 MG tablet Commonly known as:  MOBIC Take 1 tablet by mouth daily   metroNIDAZOLE 250 MG tablet Commonly known as:  FLAGYL Take 1 tablet (250 mg total) by mouth every 8 (eight) hours for 4 days.   MULTIVITAMIN PO Take by mouth.   omeprazole 20 MG capsule Commonly known as:  PRILOSEC TAKE ONE CAPSULE BY MOUTH EVERY DAY FOR HEART BURN   predniSONE  20 MG tablet Commonly known as:  DELTASONE Take 2 tablets (40 mg total) by mouth daily with breakfast for 2 days. Start taking on:  07/04/2017   tiotropium 18 MCG inhalation capsule Commonly known as:  SPIRIVA Place 1 capsule (18 mcg total) into inhaler and inhale daily.        DISCHARGE INSTRUCTIONS:   Follow-up PMD 6 days Follow-up gastroenterology as outpatient Follow-up pulmonary as outpatient  If you experience worsening of your admission symptoms, develop shortness of breath, life threatening emergency, suicidal or homicidal  thoughts you must seek medical attention immediately by calling 911 or calling your MD immediately  if symptoms less severe.  You Must read complete instructions/literature along with all the possible adverse reactions/side effects for all the Medicines you take and that have been prescribed to you. Take any new Medicines after you have completely understood and accept all the possible adverse reactions/side effects.   Please note  You were cared for by a hospitalist during your hospital stay. If you have any questions about your discharge medications or the care you received while you were in the hospital after you are discharged, you can call the unit and asked to speak with the hospitalist on call if the hospitalist that took care of you is not available. Once you are discharged, your primary care physician will handle any further medical issues. Please note that NO REFILLS for any discharge medications will be authorized once you are discharged, as it is imperative that you return to your primary care physician (or establish a relationship with a primary care physician if you do not have one) for your aftercare needs so that they can reassess your need for medications and monitor your lab values.    Today   CHIEF COMPLAINT:   Chief Complaint  Patient presents with  . Shortness of Breath    HISTORY OF PRESENT ILLNESS:  Luke [name]  is a 67 y.o. male presented with shortness of breath   VITAL SIGNS:  Blood pressure 117/82, pulse 62, temperature 97.8 F (36.6 C), temperature source Oral, resp. rate 20, height 6\' 1"  (1.854 m), weight 88.5 kg (195 lb 1.6 oz), SpO2 96 %.  PHYSICAL EXAMINATION:  GENERAL:  67 y.o.-year-old patient lying in the bed with no acute distress.  EYES: Pupils equal, round, reactive to light and accommodation. No scleral icterus. Extraocular muscles intact.  HEENT: Head atraumatic, normocephalic. Oropharynx and nasopharynx clear.  NECK:  Supple, no jugular venous  distention. No thyroid enlargement, no tenderness.  LUNGS: decreased breath sounds bilaterally, no wheezing, rales,rhonchi or crepitation. No use of accessory muscles of respiration.  CARDIOVASCULAR: S1, S2 normal. No murmurs, rubs, or gallops.  ABDOMEN: Soft, non-tender, non-distended. Bowel sounds present. No organomegaly or mass.  EXTREMITIES: No pedal edema, cyanosis, or clubbing.  NEUROLOGIC: Cranial nerves II through XII are intact. Muscle strength 5/5 in all extremities. Sensation intact. Gait not checked.  PSYCHIATRIC: The patient is alert and oriented x 3.  SKIN: No obvious rash, lesion, or ulcer.   DATA REVIEW:   CBC Recent Labs  Lab 07/02/17 0355  WBC 10.7*  HGB 13.2  HCT 39.0*  PLT 255    Chemistries  Recent Labs  Lab 07/02/17 0355  NA 138  K 3.8  CL 103  CO2 25  GLUCOSE 247*  BUN 19  CREATININE 1.09  CALCIUM 8.9    Cardiac Enzymes Recent Labs  Lab 07/01/17 1837  TROPONINI <0.03    Microbiology Results  Results for orders  placed or performed during the hospital encounter of 07/01/17  Blood culture (routine x 2)     Status: None (Preliminary result)   Collection Time: 07/01/17  6:37 PM  Result Value Ref Range Status   Specimen Description BLOOD R AC  Final   Special Requests   Final    BOTTLES DRAWN AEROBIC AND ANAEROBIC Blood Culture adequate volume   Culture   Final    NO GROWTH 2 DAYS Performed at [facility], [address] [place], [place] [address]    Report Status PENDING  Incomplete  Blood culture (routine x 2)     Status: None (Preliminary result)   Collection Time: 07/01/17  6:40 PM  Result Value Ref Range Status   Specimen Description BLOOD L AC  Final   Special Requests   Final    BOTTLES DRAWN AEROBIC AND ANAEROBIC Blood Culture results may not be optimal due to an excessive volume of blood received in culture bottles   Culture   Final    NO GROWTH 2 DAYS Performed at [facility] [address]  [place], [place] [address]    Report Status PENDING  Incomplete    RADIOLOGY:  Dg Chest 2 View  Result Date: 07/01/2017 CLINICAL DATA:  Shortness of breath. EXAM: CHEST - 2 VIEW COMPARISON:  CT 04/12/2016. PET-CT 02/19/2016. Chest x-ray 08/20/2013. FINDINGS: Mediastinum and hilar structures are normal. Heart size stable. Diffuse bilateral pulmonary interstitial prominence. Pneumonitis could present in this fashion. No pleural effusion or pneumothorax. Previously identified focal infiltrate in the right lung base on prior PET-CT has cleared. No pleural effusion or pneumothorax. Degenerative change thoracic spine. IMPRESSION: Number diffuse bilateral pulmonary interstitial prominence. Pneumonitis could present in this fashion. 2. Previously identified ill-defined opacity in the right lung base noted on prior PET-CT has cleared. Electronically Signed   By: Marcello Moores  Register   On: 07/01/2017 16:54      Management plans discussed with the patient, family and they are in agreement.  CODE STATUS:  Code Status History    Date Active Date Inactive Code Status Order ID Comments User Context   07/01/2017 2050 07/03/2017 1404 DNR [id number]  Hillary Bow, MD ED    Questions for Most Recent Historical Code Status (Order [id number])    Question Answer Comment   In the event of cardiac or respiratory ARREST Do not call a "code blue"    In the event of cardiac or respiratory ARREST Do not perform Intubation, CPR, defibrillation or ACLS    In the event of cardiac or respiratory ARREST Use medication by any route, position, wound care, and other measures to relive pain and suffering. May use oxygen, suction and manual treatment of airway obstruction as needed for comfort.       TOTAL TIME TAKING CARE OF THIS PATIENT: 35 minutes.    Loletha Grayer M.D on 07/03/2017 at 2:33 PM  Between 7am to 6pm - Pager - [phone]  After 6pm go to [email or web address] - Proofreader  [facility] Office   [phone]  CC: Primary care physician; Olin Hauser, DO

## 2017-07-04 ENCOUNTER — Telehealth: Payer: Self-pay

## 2017-07-04 LAB — HIV ANTIBODY (ROUTINE TESTING W REFLEX): HIV SCREEN 4TH GENERATION: NONREACTIVE

## 2017-07-04 NOTE — Telephone Encounter (Signed)
Transition Care Management Follow-up Telephone Call  How have you been since you were released from the hospital? "im doing okay today"  Do you understand why you were in the hospital? yes  Do you have a copy of your discharge instructions Yes Do you understand the discharge instrcutions? yes  Where were you discharged to? Home  Do you have support at home? Yes    Items Reviewed:  Medications obtained Yes  Medications reviewed: Yes  Dietary changes reviewed: yes  Home Health? N/A  DME ordered at discharge obtained? Yes  Medical supplies: NA    Functional Questionnaire:   Activities of Daily Living (ADLs):   He states they are independent in the following: ambulation, bathing and hygiene, feeding, continence, grooming, toileting, dressing and medication management States they require assistance with the following: none  Any transportation issues/concerns?: no  Any patient concerns? no  Confirmed importance and date/time of follow-up visits scheduled with PCP: yes, has follow up scheduled with Dr.karamalegos on 07/08/2017 at 3:40pm. Patient is unsure if he is keeping this appointment due to seieng another provider, he will check with his sister and have her call if needed.   Confirm appointment scheduled with specialist? Yes  Confirmed with patient if condition begins to worsen call PCP or If it's emergency go to the ER.

## 2017-07-05 LAB — BLOOD GAS, VENOUS
Acid-Base Excess: 4 mmol/L — ABNORMAL HIGH (ref 0.0–2.0)
Bicarbonate: 31.6 mmol/L — ABNORMAL HIGH (ref 20.0–28.0)
PATIENT TEMPERATURE: 37
PCO2 VEN: 60 mmHg (ref 44.0–60.0)
pH, Ven: 7.33 (ref 7.250–7.430)

## 2017-07-06 ENCOUNTER — Telehealth: Payer: Self-pay

## 2017-07-06 LAB — CULTURE, BLOOD (ROUTINE X 2)
CULTURE: NO GROWTH
Culture: NO GROWTH
Special Requests: ADEQUATE

## 2017-07-06 NOTE — Telephone Encounter (Signed)
EMMI Follow-Up: Noted on the report that patient had other questions.  Called [name] but did not have VM so was unable to leave a message.  Will try calling again.

## 2017-07-06 NOTE — Telephone Encounter (Signed)
EMMI Follow-up: Luke [name] called me back and said he was still feeling weak but will be following up with his PCP on Friday.  He will share that information with him. I let him know there would be a second automated call with a different series of questions and to let us know if he has any questions or concerns at that time.

## 2017-07-08 ENCOUNTER — Encounter: Payer: Self-pay | Admitting: Family Medicine

## 2017-07-08 ENCOUNTER — Ambulatory Visit (INDEPENDENT_AMBULATORY_CARE_PROVIDER_SITE_OTHER): Payer: BLUE CROSS/BLUE SHIELD | Admitting: Family Medicine

## 2017-07-08 VITALS — BP 125/85 | HR 92 | Temp 98.0°F | Resp 16 | Ht 73.0 in | Wt 197.0 lb

## 2017-07-08 DIAGNOSIS — J449 Chronic obstructive pulmonary disease, unspecified: Secondary | ICD-10-CM

## 2017-07-08 DIAGNOSIS — R1313 Dysphagia, pharyngeal phase: Secondary | ICD-10-CM | POA: Insufficient documentation

## 2017-07-08 DIAGNOSIS — J690 Pneumonitis due to inhalation of food and vomit: Secondary | ICD-10-CM

## 2017-07-08 DIAGNOSIS — R1310 Dysphagia, unspecified: Secondary | ICD-10-CM

## 2017-07-08 DIAGNOSIS — J432 Centrilobular emphysema: Secondary | ICD-10-CM | POA: Insufficient documentation

## 2017-07-08 NOTE — Patient Instructions (Addendum)
Thank you for coming to the office today.  Continue new inhalers - Spiriva and Advair as advised  May use Albuterol rescue as needed  Remain off antibiotics now - seems to have improved.  Increase Mucinex up to twice a day  Dysphagia 3 diet - continue with this, be careful - next plan is discuss with GI - for swallow and endoscopy evaluation.  Please schedule a Follow-up Appointment to: Return in about 3 weeks (around 07/29/2017).  If you have any other questions or concerns, please feel free to call the office or send a message through [place]. You may also schedule an earlier appointment if necessary.  Additionally, you may be receiving a survey about your experience at our office within a few days to 1 week by e-mail or mail. We value your feedback.  Nobie Putnam, DO [place]

## 2017-07-08 NOTE — Progress Notes (Signed)
Subjective:    Patient ID: Luke [name], male    DOB: July 21, 1950, 67 y.o.   MRN: [id number]  Luke [name] is a 67 y.o. male presenting on 07/08/2017 for Hospitalization Follow-up (copd as per patient could not talk for hour onset 7:30 am has some inhaler and finally can able to talk also this afternoon ate some food and felt like food stuck to his lung ( Aspiration))   HPI   HOSPITAL FOLLOW-UP VISIT  Hospital/Location: [facility] Date of Admission: 07/01/17 Date of Discharge: 07/03/17 Transitions of care telephone call: Completed by Tyler Aas LPN on 05/21/23  Reason for Admission: Dyspnea with COPD Exacerbation Primary (+Secondary) Diagnosis: Aspiration Pneumonia (multifocal) secondary to Dysphagia, COPD Exacerbation  - Hospital H&P and Discharge Summary have been reviewed - Patient presents today 5 days after recent hospitalization. Brief summary of recent course, patient initially presented to our office [facility] as new patient on 07/01/17 and he was directed immediately to the [facility] ED after evaluation as new patient, he was hospitalized that day after ED eval with acute wheezing dyspnea, CXR showed multifocal infiltrate, and he has history of chronic dysphagia in past, persistent Bronchitis and CAP previously that did not improve outpatient. - For COPD, he was treated with IV Solumedrol, breathing treatments, and pulmonology referral for outpatient expedited apt also initiated on Spiriva and Advair inhalers for maintenance, he was scheduled w/ [facility] within next 2 weeks - For Aspiration pneumonia vs infectious pneumonitis, treated with IV antibiotics, then oral Levaquin and Flagyl, speech therapy determined adv diet to dysphagia 3, and referred to GI outpatient also within 2 weeks for further eval anticipated endoscopy and swallow eval  - Today reports overall has done well after discharge. Symptoms of dyspnea and cough have improved. He has not had any fevers or chills. Finished antibiotic course  today. He is still using daily Spiriva and Advair thinks it has improved his breathing. He still uses albuterol neb PRN with relief has been using twice daily most days, not as familiar with rescue use.  He still admits to having some chest congestion and thicker phlegm, some difficulty clearing congestion. Still difficulty with swallowing but trying improved diet. He admits problem earlier today with woke up and could not speak had some difficulty breathing this resolved. He admits problem eating ice cream.  - New medications on discharge: Advair, Spiriva - Changes to current meds on discharge: none  Works as maintenance at apartment complex - stating he does not feel ready to return to work, requesting note.  Denies chest pain, fever chills, nausea vomiting, abdominal pain  I have reviewed the discharge medication list, and have reconciled the current and discharge medications today.   Current Outpatient Medications:  .  albuterol (PROVENTIL HFA;VENTOLIN HFA) 108 (90 Base) MCG/ACT inhaler, Inhale 2 puffs every 6 (six) hours as needed into the lungs for wheezing or shortness of breath., Disp: , Rfl:  .  albuterol (PROVENTIL) (2.5 MG/3ML) 0.083% nebulizer solution, Take 3 mLs (2.5 mg total) by nebulization every 2 (two) hours as needed for wheezing. Dx: copd exacerbation J44.1, Disp: 100 vial, Rfl: 0 .  fluticasone-salmeterol (ADVAIR HFA) 115-21 MCG/ACT inhaler, Inhale 2 puffs into the lungs 2 (two) times daily., Disp: 1 Inhaler, Rfl: 0 .  levothyroxine (SYNTHROID, LEVOTHROID) 88 MCG tablet, TAKE 1 TABLET BY MOUTH ONCE A DAY ON AN EMPTY STOMACH., Disp: , Rfl:  .  meloxicam (MOBIC) 15 MG tablet, Take 1 tablet by mouth daily, Disp: , Rfl: 5 .  Multiple Vitamins-Minerals (MULTIVITAMIN PO), Take by mouth., Disp: , Rfl:  .  omeprazole (PRILOSEC) 20 MG capsule, TAKE ONE CAPSULE BY MOUTH EVERY DAY FOR HEART BURN, Disp: , Rfl:  .  tiotropium (SPIRIVA) 18 MCG inhalation capsule, Place 1 capsule (18 mcg  total) into inhaler and inhale daily., Disp: 30 capsule, Rfl: 0  ------------------------------------------------------------------------- Social History   Tobacco Use  . Smoking status: Current Every Day Smoker    Packs/day: 2.00    Years: 40.00    Pack years: 80.00    Types: Cigarettes  . Smokeless tobacco: Never Used  Substance Use Topics  . Alcohol use: No  . Drug use: No    Review of Systems Per HPI unless specifically indicated above     Objective:    BP 125/85   Pulse 92   Temp 98 F (36.7 C) (Oral)   Resp 16   Ht 6\' 1"  (1.854 m)   Wt 197 lb (89.4 kg)   SpO2 98%   BMI 25.99 kg/m   Wt Readings from Last 3 Encounters:  07/08/17 197 lb (89.4 kg)  07/02/17 195 lb 1.6 oz (88.5 kg)  07/01/17 197 lb 6.4 oz (89.5 kg)    Physical Exam  Constitutional: He is oriented to person, place, and time. He appears well-developed and well-nourished. No distress.  Well-appearing, comfortable, cooperative  HENT:  Head: Normocephalic and atraumatic.  Frontal / maxillary sinuses non-tender. Nares patent without purulence or edema. Oropharynx generalized posterior erythema and some post nasal drainage without exudates, edema or asymmetry.  Dry mucus membranes. Difficulty demonstrating swallow without liquid.  Eyes: Conjunctivae are normal. Right eye exhibits no discharge. Left eye exhibits no discharge.  Neck: Normal range of motion. Neck supple. No thyromegaly present.  Cardiovascular: Normal rate, regular rhythm, normal heart sounds and intact distal pulses.  No murmur heard. Pulmonary/Chest: Effort normal. No respiratory distress. He has no wheezes. He has no rales.  Significantly improved air movement and breath sounds. Still some residual rhonchi that clear with cough but overall non focal, no crackles, no wheezing.  Musculoskeletal: Normal range of motion. He exhibits no edema.  Lymphadenopathy:    He has no cervical adenopathy.  Neurological: He is alert and oriented to  person, place, and time.  Skin: Skin is warm and dry. No rash noted. He is not diaphoretic. No erythema.  Psychiatric: He has a normal mood and affect. His behavior is normal.  Well groomed, good eye contact, normal speech and thoughts  Nursing note and vitals reviewed.   I have personally reviewed the radiology report from Chest X-ray 07/01/17.  CLINICAL DATA:  Shortness of breath.  EXAM: CHEST - 2 VIEW  COMPARISON:  CT 04/12/2016. PET-CT 02/19/2016. Chest x-ray 08/20/2013.  FINDINGS: Mediastinum and hilar structures are normal. Heart size stable. Diffuse bilateral pulmonary interstitial prominence. Pneumonitis could present in this fashion. No pleural effusion or pneumothorax. Previously identified focal infiltrate in the right lung base on prior PET-CT has cleared. No pleural effusion or pneumothorax. Degenerative change thoracic spine.  IMPRESSION: Number diffuse bilateral pulmonary interstitial prominence. Pneumonitis could present in this fashion.  2. Previously identified ill-defined opacity in the right lung base noted on prior PET-CT has cleared.   Electronically Signed   By: Marcello Moores  Register   On: 07/01/2017 16:54  Results for orders placed or performed during the hospital encounter of 07/01/17  Blood culture (routine x 2)  Result Value Ref Range   Specimen Description BLOOD L AC    Special Requests  BOTTLES DRAWN AEROBIC AND ANAEROBIC Blood Culture results may not be optimal due to an excessive volume of blood received in culture bottles   Culture      NO GROWTH 5 DAYS Performed at [facility], [address] [place], [place] [address]    Report Status 07/06/2017 FINAL   Blood culture (routine x 2)  Result Value Ref Range   Specimen Description BLOOD R AC    Special Requests      BOTTLES DRAWN AEROBIC AND ANAEROBIC Blood Culture adequate volume   Culture      NO GROWTH 5 DAYS Performed at [facility], [address] [place], [place] [address]    Report Status 07/06/2017 FINAL   CBC  Result Value Ref Range   WBC 16.0 (H) 3.8 - 10.6 K/uL   RBC 4.19 (L) 4.40 - 5.90 MIL/uL   Hemoglobin 13.2 13.0 - 18.0 g/dL   HCT 38.9 (L) 40.0 - 52.0 %   MCV 92.7 80.0 - 100.0 fL   MCH 31.4 26.0 - 34.0 pg   MCHC 33.9 32.0 - 36.0 g/dL   RDW 13.6 11.5 - 14.5 %   Platelets 272 150 - 440 K/uL  Basic metabolic panel  Result Value Ref Range   Sodium 138 135 - 145 mmol/L   Potassium 4.2 3.5 - 5.1 mmol/L   Chloride 103 101 - 111 mmol/L   CO2 28 22 - 32 mmol/L   Glucose, Bld 96 65 - 99 mg/dL   BUN 22 (H) 6 - 20 mg/dL   Creatinine, Ser 1.22 0.61 - 1.24 mg/dL   Calcium 8.6 (L) 8.9 - 10.3 mg/dL   GFR calc non Af Amer >60 >60 mL/min   GFR calc Af Amer >60 >60 mL/min   Anion gap 7 5 - 15  Brain natriuretic peptide  Result Value Ref Range   B Natriuretic Peptide 105.0 (H) 0.0 - 100.0 pg/mL  Troponin I  Result Value Ref Range   Troponin I <0.03 <0.03 ng/mL  Blood gas, venous  Result Value Ref Range   pH, Ven 7.33 7.250 - 7.430   pCO2, Ven 60 44.0 - 60.0 mmHg   Bicarbonate 31.6 (H) 20.0 - 28.0 mmol/L   Acid-Base Excess 4.0 (H) 0.0 - 2.0 mmol/L   Patient temperature 37.0    Collection site LINE    Sample type VENOUS   Lactic acid, plasma  Result Value Ref Range   Lactic Acid, Venous 1.0 0.5 - 1.9 mmol/L  HIV antibody (Routine Testing)  Result Value Ref Range   HIV Screen 4th Generation wRfx Non Reactive Non Reactive  Basic metabolic panel  Result Value Ref Range   Sodium 138 135 - 145 mmol/L   Potassium 3.8 3.5 - 5.1 mmol/L   Chloride 103 101 - 111 mmol/L   CO2 25 22 - 32 mmol/L   Glucose, Bld 247 (H) 65 - 99 mg/dL   BUN 19 6 - 20 mg/dL   Creatinine, Ser 1.09 0.61 - 1.24 mg/dL   Calcium 8.9 8.9 - 10.3 mg/dL   GFR calc non Af Amer >60 >60 mL/min   GFR calc Af Amer >60 >60 mL/min   Anion gap 10 5 - 15  CBC  Result Value Ref Range   WBC 10.7 (H) 3.8 - 10.6 K/uL   RBC 4.19 (L) 4.40 - 5.90 MIL/uL   Hemoglobin  13.2 13.0 - 18.0 g/dL   HCT 39.0 (L) 40.0 - 52.0 %   MCV 92.9 80.0 -  100.0 fL   MCH 31.4 26.0 - 34.0 pg   MCHC 33.8 32.0 - 36.0 g/dL   RDW 13.3 11.5 - 14.5 %   Platelets 255 150 - 440 K/uL      Assessment & Plan:   Problem List Items Addressed This Visit    Aspiration pneumonia ([place]) - Primary Significantly improved after antibiotic course, will not extend antibiotics today Well appearing afebrile, lungs clear Recommend improve diet as advised dysphagia 3 may need nectar thick liquids in future concern recurrent aspiration in future - Follow-up with GI    COPD (chronic obstructive pulmonary disease) ([place]) Resolved AECOPD, lungs clear Improved air movement on inhalers, after steroids as well Continue Spiriva, Advair maintenance May use albuterol nebs PRN at home now if need Follow-up as scheduled with [facility] within next 1-2 weeks may need updated PFTs    Dysphagia History of chronic problem now seems worsening, likely source of persistent aspiration and multifocal pneumonitis / pneumonia recent hospitalization Concern some swallow difficulty is related to lack of saliva, prior head neck ca removal - Continue improved dysphagia 3 diet - Follow-up with [facility] within 2 weeks - will anticipate endoscopy / swallow eval       No orders of the defined types were placed in this encounter.   Follow up plan: Return in about 3 weeks (around 07/29/2017).  Nobie Putnam, [facility] 07/08/2017, 11:27 PM

## 2017-07-18 NOTE — Progress Notes (Signed)
[name] Consultation      Assessment and Plan:  COPD/emphysema. - COPD with recent exacerbation/pneumonia.  Currently symptoms are significantly improved with no dyspnea on exertion or at rest. - Continue with smoking cessation, no need for inhalers at this time as the patient does not appear to have dyspnea symptoms. - Patient has been enrolled in lung cancer screening, last low-dose scan was in 2017, will make sure that he is followed up by cancer center.  Aspiration pneumonia/GERD/dysphagia. - Patient has difficulty with swallowing, and often regurgitates foods, which may be contributing to aspiration. - Should follow-up with gastroenterology, patient has an appointment scheduled for next week.  Nicotine abuse. - Recently quit smoking and has been feeling better since that time. - Encouraged continued smoking cessation.   Date: 07/19/2017  MRN# [id number] Luke [name] Aug 17, 1950  Referring Physician: hospitalist physician.   Luke [name] is a 67 y.o. old male seen in consultation for chief complaint of:    Chief Complaint  Patient presents with  . Hospitalization Follow-up    pt here for f/u for recent admission 6-21-- 6-23 COPD exacerbation,.  . Cough    white mucus in which pt is taking muccinex daily. Pt denies sob, wheezing and or chest tightness.    HPI:   Luke [name] is a 67 y.o. male with a history of anxiety, arthritis, metastatic carcinoma, COPD, depression, GERD.  He was admitted to the hospital from 6/20 to 07/03/17 with acute exacerbation of COPD, aspiration pneumonia, and continued smoking.  Pt is here with his sister, he is feeling "100%" better since getting out of the hospital. He no longer smoking. He is on not on any inhalers at this time.  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  In this job he is on his feet 7 days a week for much of the day. He used to smoke for 2 ppd for about 30 years.  He has a dog at home, in  bedroom but not in bed.  He does have a lot reflux symptoms, and when he eats he coughs a lot. He coughs every time he eats, he feels that things get stuck in his throat. He then will bend over and the food comes out on it's own. This happens about 3-4 times per week. He no longer eats hamburger because it will get stuck in his throat. He mostly has trouble with dry foods, he does not have a saliva glands after radiation for cancer in his neck lymph node about 17 years ago.  Has some sinus drainage, he takes an OTC claritin which controls things.  He is enrolled in lung cancer screening, last scan was in 2017.    He takes omeprazole daily. He has never seen GI, but has an appt to see them next week.   **CT chest 04/12/2016, chest x-ray 07/01/2017>> imaging personally reviewed, changes of chronic bronchitis/COPD. **CBC 02/13/2016>> absolute eosinophil count 200   PMHX:   Past Medical History:  Diagnosis Date  . Anxiety   . Arthritis   . Carcinoma metastatic to lymph node ([facility])   . Cataract    right  . COPD (chronic obstructive pulmonary disease) ([place])   . Depression   . GERD (gastroesophageal reflux disease)   . Hoarseness of voice   . Hypertension   . Personal history of tobacco use, presenting hazards to health February of 2001  . Thyroid disease    Surgical Hx:  Past Surgical  History:  Procedure Laterality Date  . COLONOSCOPY    . COLONOSCOPY WITH PROPOFOL N/A 11/29/2016   Procedure: COLONOSCOPY WITH PROPOFOL;  Surgeon: Manya Silvas, MD;  Location: [facility] ENDOSCOPY;  Service: Endoscopy;  Laterality: N/A;  . ESOPHAGOGASTRODUODENOSCOPY (EGD) WITH PROPOFOL N/A 11/29/2016   Procedure: ESOPHAGOGASTRODUODENOSCOPY (EGD) WITH PROPOFOL;  Surgeon: Manya Silvas, MD;  Location: [facility] ENDOSCOPY;  Service: Endoscopy;  Laterality: N/A;  . NECK SURGERY     status post diagnosis of cancer with chemo and radiation  . TONSILLECTOMY     Family Hx:  Family History  Problem Relation Age of  Onset  . Breast cancer Mother   . Heart disease Mother   . Cancer Mother        breast, back, bladder  . Heart attack Father   . Heart disease Father    Social Hx:   Social History   Tobacco Use  . Smoking status: Current Some Day Smoker    Packs/day: 2.00    Years: 40.00    Pack years: 80.00    Types: Cigarettes  . Smokeless tobacco: Never Used  Substance Use Topics  . Alcohol use: No  . Drug use: No   Medication:    Current Outpatient Medications:  .  levothyroxine (SYNTHROID, LEVOTHROID) 88 MCG tablet, TAKE 1 TABLET BY MOUTH ONCE A DAY ON AN EMPTY STOMACH., Disp: , Rfl:  .  meloxicam (MOBIC) 15 MG tablet, Take 1 tablet by mouth daily, Disp: , Rfl: 5 .  Multiple Vitamins-Minerals (MULTIVITAMIN PO), Take by mouth., Disp: , Rfl:  .  omeprazole (PRILOSEC) 20 MG capsule, TAKE ONE CAPSULE BY MOUTH EVERY DAY FOR HEART BURN, Disp: , Rfl:  .  tiotropium (SPIRIVA) 18 MCG inhalation capsule, Place 1 capsule (18 mcg total) into inhaler and inhale daily., Disp: 30 capsule, Rfl: 0 .  albuterol (PROVENTIL HFA;VENTOLIN HFA) 108 (90 Base) MCG/ACT inhaler, Inhale 2 puffs every 6 (six) hours as needed into the lungs for wheezing or shortness of breath., Disp: , Rfl:  .  albuterol (PROVENTIL) (2.5 MG/3ML) 0.083% nebulizer solution, Take 3 mLs (2.5 mg total) by nebulization every 2 (two) hours as needed for wheezing. Dx: copd exacerbation J44.1 (Patient not taking: Reported on 07/19/2017), Disp: 100 vial, Rfl: 0 .  fluticasone-salmeterol (ADVAIR HFA) 115-21 MCG/ACT inhaler, Inhale 2 puffs into the lungs 2 (two) times daily. (Patient not taking: Reported on 07/19/2017), Disp: 1 Inhaler, Rfl: 0   Allergies:  Patient has no known allergies.  Review of Systems: Gen:  Denies  fever, sweats, chills HEENT: Denies blurred vision, double vision. bleeds, sore throat Cvc:  No dizziness, chest pain. Resp:   Denies cough or sputum production, shortness of breath Gi: Denies swallowing difficulty, stomach  pain. Gu:  Denies bladder incontinence, burning urine Ext:   No Joint pain, stiffness. Skin: No skin rash,  hives  Endoc:  No polyuria, polydipsia. Psych: No depression, insomnia. Other:  All other systems were reviewed with the patient and were negative other that what is mentioned in the HPI.   Physical Examination:   VS: BP 94/68 (BP Location: Left Arm, Cuff Size: Normal)   Pulse 74   Ht 6\' 1"  (1.854 m)   Wt 198 lb (89.8 kg)   SpO2 96%   BMI 26.12 kg/m   General Appearance: No distress  Neuro:without focal findings,  speech normal,  HEENT: PERRLA, EOM intact.   Pulmonary: normal breath sounds, No wheezing.  CardiovascularNormal S1,S2.  No m/r/g.   Abdomen: Benign, Soft,  non-tender. Renal:  No costovertebral tenderness  GU:  No performed at this time. Endoc: No evident thyromegaly, no signs of acromegaly. Skin:   warm, no rashes, no ecchymosis  Extremities: normal, no cyanosis, clubbing.  Other findings:    LABORATORY PANEL:   CBC No results for input(s): WBC, HGB, HCT, PLT in the last 168 hours. ------------------------------------------------------------------------------------------------------------------  Chemistries  No results for input(s): NA, K, CL, CO2, GLUCOSE, BUN, CREATININE, CALCIUM, MG, AST, ALT, ALKPHOS, BILITOT in the last 168 hours.  Invalid input(s): GFRCGP ------------------------------------------------------------------------------------------------------------------  Cardiac Enzymes No results for input(s): TROPONINI in the last 168 hours. ------------------------------------------------------------  RADIOLOGY:  No results found.     Thank  you for the consultation and for allowing [facility] to assist in the care of your patient. Our recommendations are noted above.  Please contact us if we can be of further service.   Marda Stalker, M.D., F.C.C.P.  Board Certified in Internal Medicine, Pulmonary  Medicine, [place], and Sleep Medicine.  [facility] Office Number: [phone]   07/19/2017

## 2017-07-19 ENCOUNTER — Ambulatory Visit (INDEPENDENT_AMBULATORY_CARE_PROVIDER_SITE_OTHER): Payer: BLUE CROSS/BLUE SHIELD | Admitting: Internal Medicine

## 2017-07-19 ENCOUNTER — Encounter: Payer: Self-pay | Admitting: Internal Medicine

## 2017-07-19 VITALS — BP 94/68 | HR 74 | Ht 73.0 in | Wt 198.0 lb

## 2017-07-19 DIAGNOSIS — J449 Chronic obstructive pulmonary disease, unspecified: Secondary | ICD-10-CM

## 2017-07-19 NOTE — Patient Instructions (Signed)
Continue smoking cessation  

## 2017-07-26 ENCOUNTER — Ambulatory Visit: Payer: BLUE CROSS/BLUE SHIELD | Admitting: Gastroenterology

## 2017-07-29 ENCOUNTER — Other Ambulatory Visit: Payer: Self-pay

## 2017-07-29 ENCOUNTER — Other Ambulatory Visit: Payer: Self-pay | Admitting: Family Medicine

## 2017-07-29 ENCOUNTER — Encounter: Payer: Self-pay | Admitting: Family Medicine

## 2017-07-29 ENCOUNTER — Ambulatory Visit (INDEPENDENT_AMBULATORY_CARE_PROVIDER_SITE_OTHER): Payer: BLUE CROSS/BLUE SHIELD | Admitting: Family Medicine

## 2017-07-29 VITALS — BP 104/68 | HR 78 | Temp 98.3°F | Ht 73.0 in | Wt 196.6 lb

## 2017-07-29 DIAGNOSIS — Z8579 Personal history of other malignant neoplasms of lymphoid, hematopoietic and related tissues: Secondary | ICD-10-CM

## 2017-07-29 DIAGNOSIS — R1310 Dysphagia, unspecified: Secondary | ICD-10-CM

## 2017-07-29 DIAGNOSIS — R7309 Other abnormal glucose: Secondary | ICD-10-CM

## 2017-07-29 DIAGNOSIS — I10 Essential (primary) hypertension: Secondary | ICD-10-CM

## 2017-07-29 DIAGNOSIS — J432 Centrilobular emphysema: Secondary | ICD-10-CM

## 2017-07-29 DIAGNOSIS — Z8572 Personal history of non-Hodgkin lymphomas: Secondary | ICD-10-CM

## 2017-07-29 DIAGNOSIS — E039 Hypothyroidism, unspecified: Secondary | ICD-10-CM

## 2017-07-29 DIAGNOSIS — E7800 Pure hypercholesterolemia, unspecified: Secondary | ICD-10-CM

## 2017-07-29 DIAGNOSIS — Z125 Encounter for screening for malignant neoplasm of prostate: Secondary | ICD-10-CM

## 2017-07-29 DIAGNOSIS — Z0000 Encounter for general adult medical examination without abnormal findings: Secondary | ICD-10-CM

## 2017-07-29 DIAGNOSIS — Z1159 Encounter for screening for other viral diseases: Secondary | ICD-10-CM

## 2017-07-29 MED ORDER — PREDNISONE 10 MG PO TABS: ORAL_TABLET | ORAL | 0 refills | Status: DC

## 2017-07-29 NOTE — Assessment & Plan Note (Signed)
Still a significant concern, pending AGI apt 08/03/17 Advised him to be continue previous dietary precautions

## 2017-07-29 NOTE — Assessment & Plan Note (Signed)
Seems improved dramatically but still reduced energy No hypoxia Suspected COPD emphysema based on history and evaluation by Pulmonology Followed by [facility] Pulmonology No longer on maintenance inhalers Future PFTs in 12/2017  Plan Discussion on still reduced air movement, despite no worsening cough or COPD flare symptoms or new aspiration concern. - offered trial of Prednisone taper 6 days to see if can improve his energy and breathing - Follow-up if not improve or return to Pulm sooner than several months if worsening - may need back on maintenance inhalers

## 2017-07-29 NOTE — Assessment & Plan Note (Addendum)
Concern low BP initial, manual repeat improved Remains low normal - Home BP readings lower than avg  No known complications     Plan:  1. Encourage improved lifestyle - may add small amount of salt to meal in morning, otherwise low sodium diet, regular activity, continue hydration 2. Continue monitor BP outside office, bring readings to next visit, if persistently >140/90 or new symptoms notify office sooner - or notify if low BP < 90 3. Follow-up 6 weeks for annual with labs

## 2017-07-29 NOTE — Progress Notes (Signed)
Subjective:    Patient ID: Luke [name], male    DOB: 23-Feb-1950, 67 y.o.   MRN: [id number]  Luke [name] is a 67 y.o. male presenting on 07/29/2017 for COPD   HPI   Low Blood Pressure / History of CHRONIC HTN: Reports he checks BP at home, avg readings were SBP 90-110 and DBP 70s. Recently he reported to feel more lightheaded and felt more tired past 1-2 days. Had some lower BP readings. Current Meds - NONE   Lifestyle: - Diet: no salt diet, tries to eat healthy, drinks half gallon of water most days - Exercise: less active recently due to illness but improving activity Admits fatigued and reduced energy Denies CP, dyspnea, HA, edema, syncope  Follow-up COPD / Tobacco Abuse / Dysphagia history of aspiration Last visit with Pulmonology outpatient, [place] Dr Juanell Fairly on 07/19/17, dx with COPD/emphysema, recent hospitalization with aspiration pneumonia, see report. He was advised to not start any new inhalers, he has a back up albuterol PRN but not needed this since that time.  - He remains on low amt cigarettes, smoking 4-5 cigarettes a day now, still remains reduced, no longer 2ppd - Regarding aspiration pneumonia and dysphagia, he was referred to AGI - he will see Dr Jonathon Bellows in about a week on 08/03/17 - He will cancel apt with [facility] on 8/7 since he was able to get in sooner here - he still has coughing spells with eating   Depression screen [facility] 2/9 07/08/2017 07/01/2017  Decreased Interest 0 0  Down, Depressed, Hopeless 0 0  PHQ - 2 Score 0 0    Social History   Tobacco Use  . Smoking status: Current Some Day Smoker    Packs/day: 0.25    Years: 40.00    Pack years: 10.00    Types: Cigarettes  . Smokeless tobacco: Never Used  Substance Use Topics  . Alcohol use: No  . Drug use: No    Review of Systems Per HPI unless specifically indicated above     Objective:    BP 104/68 (BP Location: Left Arm, Cuff Size: Normal)   Pulse 78   Temp 98.3 F (36.8 C) (Oral)    Ht 6\' 1"  (1.854 m)   Wt 196 lb 9.6 oz (89.2 kg)   SpO2 96%   BMI 25.94 kg/m   Wt Readings from Last 3 Encounters:  07/29/17 196 lb 9.6 oz (89.2 kg)  07/19/17 198 lb (89.8 kg)  07/08/17 197 lb (89.4 kg)    Physical Exam  Constitutional: He is oriented to person, place, and time. He appears well-developed and well-nourished. No distress.  Well-appearing but seems tired, mostly comfortable, cooperative  HENT:  Head: Normocephalic and atraumatic.  Dry mucus membranes at baseline  Eyes: Conjunctivae are normal. Right eye exhibits no discharge. Left eye exhibits no discharge.  Neck: Normal range of motion. Neck supple. No thyromegaly present.  Cardiovascular: Normal rate, regular rhythm, normal heart sounds and intact distal pulses.  No murmur heard. Pulmonary/Chest: Effort normal. No respiratory distress. He has no wheezes. He has no rales.  Still significantly improved breath sounds. No focal or abnormal crackles, wheezing or rhonchi.  Still reduced air movement diffusely seems at baseline.  Musculoskeletal: Normal range of motion. He exhibits no edema.  Lymphadenopathy:    He has no cervical adenopathy.  Neurological: He is alert and oriented to person, place, and time.  Skin: Skin is warm and dry. No rash noted. He is not diaphoretic.  No erythema.  Psychiatric: He has a normal mood and affect. His behavior is normal.  Well groomed, good eye contact, normal speech and thoughts  Nursing note and vitals reviewed.  Results for orders placed or performed during the hospital encounter of 07/01/17  Blood culture (routine x 2)  Result Value Ref Range   Specimen Description BLOOD L AC    Special Requests      BOTTLES DRAWN AEROBIC AND ANAEROBIC Blood Culture results may not be optimal due to an excessive volume of blood received in culture bottles   Culture      NO GROWTH 5 DAYS Performed at [facility], [address] [place], [place] [address]    Report Status 07/06/2017  FINAL   Blood culture (routine x 2)  Result Value Ref Range   Specimen Description BLOOD R AC    Special Requests      BOTTLES DRAWN AEROBIC AND ANAEROBIC Blood Culture adequate volume   Culture      NO GROWTH 5 DAYS Performed at [facility], [address] [place], [place] [address]    Report Status 07/06/2017 FINAL   CBC  Result Value Ref Range   WBC 16.0 (H) 3.8 - 10.6 K/uL   RBC 4.19 (L) 4.40 - 5.90 MIL/uL   Hemoglobin 13.2 13.0 - 18.0 g/dL   HCT 38.9 (L) 40.0 - 52.0 %   MCV 92.7 80.0 - 100.0 fL   MCH 31.4 26.0 - 34.0 pg   MCHC 33.9 32.0 - 36.0 g/dL   RDW 13.6 11.5 - 14.5 %   Platelets 272 150 - 440 K/uL  Basic metabolic panel  Result Value Ref Range   Sodium 138 135 - 145 mmol/L   Potassium 4.2 3.5 - 5.1 mmol/L   Chloride 103 101 - 111 mmol/L   CO2 28 22 - 32 mmol/L   Glucose, Bld 96 65 - 99 mg/dL   BUN 22 (H) 6 - 20 mg/dL   Creatinine, Ser 1.22 0.61 - 1.24 mg/dL   Calcium 8.6 (L) 8.9 - 10.3 mg/dL   GFR calc non Af Amer >60 >60 mL/min   GFR calc Af Amer >60 >60 mL/min   Anion gap 7 5 - 15  Brain natriuretic peptide  Result Value Ref Range   B Natriuretic Peptide 105.0 (H) 0.0 - 100.0 pg/mL  Troponin I  Result Value Ref Range   Troponin I <0.03 <0.03 ng/mL  Blood gas, venous  Result Value Ref Range   pH, Ven 7.33 7.250 - 7.430   pCO2, Ven 60 44.0 - 60.0 mmHg   Bicarbonate 31.6 (H) 20.0 - 28.0 mmol/L   Acid-Base Excess 4.0 (H) 0.0 - 2.0 mmol/L   Patient temperature 37.0    Collection site LINE    Sample type VENOUS   Lactic acid, plasma  Result Value Ref Range   Lactic Acid, Venous 1.0 0.5 - 1.9 mmol/L  HIV antibody (Routine Testing)  Result Value Ref Range   HIV Screen 4th Generation wRfx Non Reactive Non Reactive  Basic metabolic panel  Result Value Ref Range   Sodium 138 135 - 145 mmol/L   Potassium 3.8 3.5 - 5.1 mmol/L   Chloride 103 101 - 111 mmol/L   CO2 25 22 - 32 mmol/L   Glucose, Bld 247 (H) 65 - 99 mg/dL   BUN 19 6 - 20 mg/dL    Creatinine, Ser 1.09 0.61 - 1.24 mg/dL   Calcium 8.9 8.9 - 10.3 mg/dL  GFR calc non Af Amer >60 >60 mL/min   GFR calc Af Amer >60 >60 mL/min   Anion gap 10 5 - 15  CBC  Result Value Ref Range   WBC 10.7 (H) 3.8 - 10.6 K/uL   RBC 4.19 (L) 4.40 - 5.90 MIL/uL   Hemoglobin 13.2 13.0 - 18.0 g/dL   HCT 39.0 (L) 40.0 - 52.0 %   MCV 92.9 80.0 - 100.0 fL   MCH 31.4 26.0 - 34.0 pg   MCHC 33.8 32.0 - 36.0 g/dL   RDW 13.3 11.5 - 14.5 %   Platelets 255 150 - 440 K/uL      Assessment & Plan:   Problem List Items Addressed This Visit    Benign essential hypertension    Concern low BP initial, manual repeat improved Remains low normal - Home BP readings lower than avg  No known complications     Plan:  1. Encourage improved lifestyle - may add small amount of salt to meal in morning, otherwise low sodium diet, regular activity, continue hydration 2. Continue monitor BP outside office, bring readings to next visit, if persistently >140/90 or new symptoms notify office sooner - or notify if low BP < 90 3. Follow-up 6 weeks for annual with labs       COPD (chronic obstructive pulmonary disease) ([place]) - Primary    Seems improved dramatically but still reduced energy No hypoxia Suspected COPD emphysema based on history and evaluation by Pulmonology Followed by [facility] Pulmonology No longer on maintenance inhalers Future PFTs in 12/2017  Plan Discussion on still reduced air movement, despite no worsening cough or COPD flare symptoms or new aspiration concern. - offered trial of Prednisone taper 6 days to see if can improve his energy and breathing - Follow-up if not improve or return to Pulm sooner than several months if worsening - may need back on maintenance inhalers      Relevant Medications   predniSONE (DELTASONE) 10 MG tablet   Dysphagia    Still a significant concern, pending AGI apt 08/03/17 Advised him to be continue previous dietary precautions         Meds ordered this  encounter  Medications  . predniSONE (DELTASONE) 10 MG tablet    Sig: Take 6 tabs with breakfast Day 1, 5 tabs Day 2, 4 tabs Day 3, 3 tabs Day 4, 2 tabs Day 5, 1 tab Day 6.    Dispense:  21 tablet    Refill:  0    Follow up plan: Return in about 6 weeks (around 09/09/2017) for Annual Physical.  Future labs ordered for 08/26/17  Nobie Putnam, [facility] 07/29/2017, 5:52 PM

## 2017-07-29 NOTE — Patient Instructions (Addendum)
Thank you for coming to the office today.  BP is improved on re-check - keep close watch on this if persistently < 90 on top number then contact office or seek more immediate care at hospital - Stay hydrated, may have a little bit of salt in morning with food if prefer  For lungs - overall still clear today but reduced air movement with emphysema - go ahead and start Prednisone - 6 day course down by 1 pill each day to finish - take with food.  Should help breathing and energy  If not improving still after 1-2 weeks, then can call Dr Juanell Fairly at Pulmonary - see if you can be seen earlier and if need to get back on one of the inhalers.  GI apt on 7/24 for the swallowing evaluation  DUE for FASTING BLOOD WORK (no food or drink after midnight before the lab appointment, only water or coffee without cream/sugar on the morning of)  SCHEDULE "Lab Only" visit in the morning at the clinic for lab draw in 4-6 WEEKS   - Make sure Lab Only appointment is at about 1 week before your next appointment, so that results will be available  For Lab Results, once available within 2-3 days of blood draw, you can can log in to MyChart online to view your results and a brief explanation. Also, we can discuss results at next follow-up visit.  Please schedule a Follow-up Appointment to: Return in about 6 weeks (around 09/09/2017) for Annual Physical.  If you have any other questions or concerns, please feel free to call the office or send a message through [facility]. You may also schedule an earlier appointment if necessary.  Additionally, you may be receiving a survey about your experience at our office within a few days to 1 week by e-mail or mail. We value your feedback.  Nobie Putnam, DO [place]

## 2017-08-03 ENCOUNTER — Encounter: Payer: Self-pay | Admitting: Gastroenterology

## 2017-08-03 ENCOUNTER — Ambulatory Visit (INDEPENDENT_AMBULATORY_CARE_PROVIDER_SITE_OTHER): Payer: BLUE CROSS/BLUE SHIELD | Admitting: Gastroenterology

## 2017-08-03 VITALS — BP 107/71 | HR 77 | Resp 17 | Ht 73.0 in | Wt 196.4 lb

## 2017-08-03 DIAGNOSIS — Z1211 Encounter for screening for malignant neoplasm of colon: Secondary | ICD-10-CM | POA: Diagnosis not present

## 2017-08-03 DIAGNOSIS — R1310 Dysphagia, unspecified: Secondary | ICD-10-CM | POA: Diagnosis not present

## 2017-08-03 NOTE — Progress Notes (Signed)
Jonathon Bellows MD, MRCP(U.K) [address]  [place]  [place], [place] [address]  Main: [phone]  Fax: [phone]   Gastroenterology Consultation  Referring Provider:     Nobie Putnam * Primary Care Physician:  Olin Hauser, DO Primary Gastroenterologist:  Dr. Jonathon Bellows  Reason for Consultation:     Dysphagia         HPI:   Luke [name] is a 67 y.o. y/o male referred for consultation & management  by Dr. Parks Ranger, Devonne Doughty, DO.    He has been referred for dysphagia and aspiration . He had been seen by DR Tiffany Kocher in 08/2016 for constipation , dysphagia for solids over a year , chest discomfort. He had an EGD and a schtzkis ring was stretched to 16 mm , no subsequent follow up , no biopsies taken to rule out EOE. A colonoscopy was done at the same time and a tubular adenoma was excised. Poor prep and repeat colonoscopy suggested.    Dysphagia: Onset and any progression: few years getting worse Frequency: every day , everytime he eats, coughs, feels food is going the wrong way as well  Foods affected : solids mostly - mostly steak, does not feel it gets stuck, he says that he drinks but it does not go down , goes outside bends over and it comes out. , occasionally food consumed a long time back comes out.  Prior episodes of impaction: no  History of asthma/allergy : no  History of heartburn/Reflux : yes  Weight loss/weight gain : no   Prior EGD: yes  PPI/H2 blocker use : Prilosec- every day , morning , before his meals.   He had radiation to his neck 18 years back and affected his salivary glands and since then produces minimal saliva  Past Medical History:  Diagnosis Date  . Anxiety   . Arthritis   . Carcinoma metastatic to lymph node ([facility])   . Cataract    right  . COPD (chronic obstructive pulmonary disease) ([facility])   . Depression   . GERD (gastroesophageal reflux disease)   . Hoarseness of voice   . Hypertension   . Personal history  of tobacco use, presenting hazards to health February of 2001  . Thyroid disease     Past Surgical History:  Procedure Laterality Date  . COLONOSCOPY    . COLONOSCOPY WITH PROPOFOL N/A 11/29/2016   Procedure: COLONOSCOPY WITH PROPOFOL;  Surgeon: Manya Silvas, MD;  Location[facility] ENDOSCOPY;  Service: Endoscopy;  Laterality: N/A;  . ESOPHAGOGASTRODUODENOSCOPY (EGD) WITH PROPOFOL N/A 11/29/2016   Procedure: ESOPHAGOGASTRODUODENOSCOPY (EGD) WITH PROPOFOL;  Surgeon: Manya Silvas, MD;  Location: [facility] ENDOSCOPY;  Service: Endoscopy;  Laterality: N/A;  . NECK SURGERY     status post diagnosis of cancer with chemo and radiation  . TONSILLECTOMY      Prior to Admission medications   Medication Sig Start Date End Date Taking? Authorizing Provider  albuterol (PROVENTIL HFA;VENTOLIN HFA) 108 (90 Base) MCG/ACT inhaler Inhale 2 puffs every 6 (six) hours as needed into the lungs for wheezing or shortness of breath.    [provider]  albuterol (PROVENTIL) (2.5 MG/3ML) 0.083% nebulizer solution Take 3 mLs (2.5 mg total) by nebulization every 2 (two) hours as needed for wheezing. Dx: copd exacerbation J44.1 Patient not taking: Reported on 07/29/2017 07/03/17   Loletha Grayer, MD  levothyroxine (SYNTHROID, LEVOTHROID) 88 MCG tablet TAKE 1 TABLET BY MOUTH ONCE A DAY ON AN EMPTY STOMACH. 07/17/14  [provider]  meloxicam (MOBIC) 15 MG tablet Take 1 tablet by mouth daily 09/14/14   [provider]  Multiple Vitamins-Minerals (MULTIVITAMIN PO) Take by mouth.    [provider]  omeprazole (PRILOSEC) 20 MG capsule TAKE ONE CAPSULE BY MOUTH EVERY DAY FOR HEART BURN 10/30/13   [provider]  predniSONE (DELTASONE) 10 MG tablet Take 6 tabs with breakfast Day 1, 5 tabs Day 2, 4 tabs Day 3, 3 tabs Day 4, 2 tabs Day 5, 1 tab Day 6. 07/29/17   Karamalegos, Devonne Doughty, DO    Family History  Problem Relation Age of Onset  . Breast cancer Mother   . Heart  disease Mother   . Cancer Mother        breast, back, bladder  . Heart attack Father   . Heart disease Father      Social History   Tobacco Use  . Smoking status: Current Some Day Smoker    Packs/day: 0.25    Years: 40.00    Pack years: 10.00    Types: Cigarettes  . Smokeless tobacco: Never Used  Substance Use Topics  . Alcohol use: No  . Drug use: No    Allergies as of 08/03/2017  . (No Known Allergies)    Review of Systems:    All systems reviewed and negative except where noted in HPI.   Physical Exam:  There were no vitals taken for this visit. No LMP for male patient. Psych:  Alert and cooperative. Normal mood and affect. General:   Alert,  Well-developed, well-nourished, pleasant and cooperative in NAD Head:  Normocephalic and atraumatic. Eyes:  Sclera clear, no icterus.   Conjunctiva pink. Ears:  Normal auditory acuity. Nose:  No deformity, discharge, or lesions. Mouth:  No deformity or lesions,oropharynx pink & moist. Neck:  Supple; no masses or thyromegaly. Lungs:  Respirations even and unlabored.  Clear throughout to auscultation.   No wheezes, crackles, or rhonchi. No acute distress. Heart:  Regular rate and rhythm; no murmurs, clicks, rubs, or gallops. Abdomen:  Normal bowel sounds.  No bruits.  Soft, non-tender and non-distended without masses, hepatosplenomegaly or hernias noted.  No guarding or rebound tenderness.    Msk:  Symmetrical without gross deformities. Good, equal movement & strength bilaterally. Pulses:  Normal pulses noted. Extremities:  No clubbing or edema.  No cyanosis. Neurologic:  Alert and oriented x3;  grossly normal neurologically. Skin:  Intact without significant lesions or rashes. No jaundice. Lymph Nodes:  No significant cervical adenopathy. Psych:  Alert and cooperative. Normal mood and affect.  Imaging Studies: No results found.  Assessment and Plan:   Luke [name] is a 67 y.o. y/o male has been referred for dysphagia  and aspiration , incomplete work up in 2018 , did not have a follow up after EGD to check for resolution of symptoms. Poor prep last colonoscopy. Dysphagia could be from a transfer dysphagia +/- esophageal dysphagia. Lack of saliva could be making things worse   Plan  1. Repeat EGD with aim to take biopsies to r/o EOE. Repeat colonoscopy was prep was poor  2. IF EGD and biopsies are negative then would need esophageal manometry to r/o achalasia.  3. PPI to be taken daily to empirically treat any reflux related secondary dysphagia.  4. Life style changes for GERD: Patient information provided.  5. Avoid steak and pork till we complete evaluation, every solid food bite should be with a tiny sip of liquid.  6.  Modified barium swallow    I have discussed alternative options, risks & benefits,  which include, but are not limited to, bleeding, infection, perforation,respiratory complication & drug reaction.  The patient agrees with this plan & written consent will be obtained.      Follow up in 6 weeks   Dr Jonathon Bellows MD,MRCP(U.K)

## 2017-08-05 ENCOUNTER — Other Ambulatory Visit: Payer: Self-pay

## 2017-08-05 DIAGNOSIS — R1310 Dysphagia, unspecified: Secondary | ICD-10-CM

## 2017-08-05 DIAGNOSIS — Z86010 Personal history of colonic polyps: Secondary | ICD-10-CM

## 2017-08-05 DIAGNOSIS — Z1211 Encounter for screening for malignant neoplasm of colon: Secondary | ICD-10-CM

## 2017-08-05 NOTE — Progress Notes (Unsigned)
mod

## 2017-08-12 ENCOUNTER — Other Ambulatory Visit: Payer: Self-pay | Admitting: Internal Medicine

## 2017-08-12 ENCOUNTER — Other Ambulatory Visit: Payer: Self-pay

## 2017-08-12 ENCOUNTER — Telehealth: Payer: Self-pay

## 2017-08-12 DIAGNOSIS — R1310 Dysphagia, unspecified: Secondary | ICD-10-CM

## 2017-08-12 NOTE — Telephone Encounter (Signed)
Modified Barium Swallow scheduled for  08/26/17 at 1:00pm at [facility]. Arrive at 12:30pm at the medical mall register[facility] desk. May have regular breakfast and light lunch. Pt notified.

## 2017-08-18 ENCOUNTER — Other Ambulatory Visit: Payer: Self-pay

## 2017-08-18 MED ORDER — PEG 3350-KCL-NA BICARB-NACL 420 G PO SOLR: ORAL | 0 refills | Status: DC

## 2017-08-20 ENCOUNTER — Telehealth: Payer: Self-pay

## 2017-08-20 NOTE — Telephone Encounter (Signed)
Call pt on 08-20-17 regarding Lung Screening  Left message at 11:02

## 2017-08-22 ENCOUNTER — Encounter: Payer: Self-pay | Admitting: Anesthesiology

## 2017-08-22 ENCOUNTER — Telehealth: Payer: Self-pay | Admitting: *Deleted

## 2017-08-22 ENCOUNTER — Ambulatory Visit: Payer: BLUE CROSS/BLUE SHIELD | Admitting: Anesthesiology

## 2017-08-22 ENCOUNTER — Encounter
Admission: RE | Disposition: A | Discharge status: Home / Self Care | Payer: Self-pay | Source: Ambulatory Visit | Attending: Gastroenterology

## 2017-08-22 ENCOUNTER — Ambulatory Visit
Admission: RE | Admit: 2017-08-22 | Discharge: 2017-08-22 | Disposition: A | Discharge status: Home / Self Care | Payer: BLUE CROSS/BLUE SHIELD | Source: Ambulatory Visit | Attending: Gastroenterology | Admitting: Gastroenterology

## 2017-08-22 DIAGNOSIS — Z79890 Hormone replacement therapy (postmenopausal): Secondary | ICD-10-CM | POA: Diagnosis not present

## 2017-08-22 DIAGNOSIS — R1310 Dysphagia, unspecified: Secondary | ICD-10-CM | POA: Insufficient documentation

## 2017-08-22 DIAGNOSIS — Z8589 Personal history of malignant neoplasm of other organs and systems: Secondary | ICD-10-CM | POA: Insufficient documentation

## 2017-08-22 DIAGNOSIS — D122 Benign neoplasm of ascending colon: Secondary | ICD-10-CM | POA: Insufficient documentation

## 2017-08-22 DIAGNOSIS — J449 Chronic obstructive pulmonary disease, unspecified: Secondary | ICD-10-CM | POA: Insufficient documentation

## 2017-08-22 DIAGNOSIS — F419 Anxiety disorder, unspecified: Secondary | ICD-10-CM | POA: Diagnosis not present

## 2017-08-22 DIAGNOSIS — Z86010 Personal history of colonic polyps: Secondary | ICD-10-CM | POA: Insufficient documentation

## 2017-08-22 DIAGNOSIS — K640 First degree hemorrhoids: Secondary | ICD-10-CM | POA: Insufficient documentation

## 2017-08-22 DIAGNOSIS — Z87891 Personal history of nicotine dependence: Secondary | ICD-10-CM | POA: Insufficient documentation

## 2017-08-22 DIAGNOSIS — E079 Disorder of thyroid, unspecified: Secondary | ICD-10-CM | POA: Diagnosis not present

## 2017-08-22 DIAGNOSIS — D125 Benign neoplasm of sigmoid colon: Secondary | ICD-10-CM

## 2017-08-22 DIAGNOSIS — I10 Essential (primary) hypertension: Secondary | ICD-10-CM | POA: Diagnosis not present

## 2017-08-22 DIAGNOSIS — K224 Dyskinesia of esophagus: Secondary | ICD-10-CM | POA: Insufficient documentation

## 2017-08-22 DIAGNOSIS — Z1211 Encounter for screening for malignant neoplasm of colon: Secondary | ICD-10-CM | POA: Diagnosis not present

## 2017-08-22 DIAGNOSIS — Z79899 Other long term (current) drug therapy: Secondary | ICD-10-CM | POA: Insufficient documentation

## 2017-08-22 DIAGNOSIS — K635 Polyp of colon: Secondary | ICD-10-CM | POA: Diagnosis not present

## 2017-08-22 DIAGNOSIS — K210 Gastro-esophageal reflux disease with esophagitis: Secondary | ICD-10-CM | POA: Diagnosis not present

## 2017-08-22 DIAGNOSIS — K228 Other specified diseases of esophagus: Secondary | ICD-10-CM | POA: Diagnosis not present

## 2017-08-22 DIAGNOSIS — K5790 Diverticulosis of intestine, part unspecified, without perforation or abscess without bleeding: Secondary | ICD-10-CM | POA: Diagnosis not present

## 2017-08-22 DIAGNOSIS — Z122 Encounter for screening for malignant neoplasm of respiratory organs: Secondary | ICD-10-CM

## 2017-08-22 HISTORY — PX: COLONOSCOPY WITH PROPOFOL: SHX5780

## 2017-08-22 HISTORY — PX: ESOPHAGOGASTRODUODENOSCOPY (EGD) WITH PROPOFOL: SHX5813

## 2017-08-22 SURGERY — COLONOSCOPY WITH PROPOFOL
Anesthesia: General

## 2017-08-22 MED ORDER — LIDOCAINE HCL (PF) 1 % IJ SOLN
2.0000 mL | Freq: Once | INTRAMUSCULAR | Status: AC
  Administered 2017-08-22: 0.3 mL via INTRADERMAL

## 2017-08-22 MED ORDER — LIDOCAINE HCL (PF) 1 % IJ SOLN
INTRAMUSCULAR | Status: AC
  Administered 2017-08-22: 0.3 mL via INTRADERMAL
  Filled 2017-08-22: qty 2

## 2017-08-22 MED ORDER — GLYCOPYRROLATE 0.2 MG/ML IJ SOLN
INTRAMUSCULAR | Status: DC | PRN
  Administered 2017-08-22: 0.2 mg via INTRAVENOUS

## 2017-08-22 MED ORDER — PROPOFOL 10 MG/ML IV BOLUS
INTRAVENOUS | Status: DC | PRN
  Administered 2017-08-22: 70 mg via INTRAVENOUS

## 2017-08-22 MED ORDER — EPHEDRINE SULFATE 50 MG/ML IJ SOLN
INTRAMUSCULAR | Status: DC | PRN
  Administered 2017-08-22 (×2): 10 mg via INTRAVENOUS

## 2017-08-22 MED ORDER — PROPOFOL 500 MG/50ML IV EMUL
INTRAVENOUS | Status: DC | PRN
  Administered 2017-08-22: 140 ug/kg/min via INTRAVENOUS

## 2017-08-22 MED ORDER — LIDOCAINE HCL (CARDIAC) PF 100 MG/5ML IV SOSY
PREFILLED_SYRINGE | INTRAVENOUS | Status: DC | PRN
  Administered 2017-08-22: 60 mg via INTRAVENOUS

## 2017-08-22 MED ORDER — SODIUM CHLORIDE 0.9 % IV SOLN
INTRAVENOUS | Status: DC
  Administered 2017-08-22: 1000 mL via INTRAVENOUS

## 2017-08-22 NOTE — Anesthesia Postprocedure Evaluation (Signed)
Anesthesia Post Note  Patient: Luke [name]  Procedure(s) Performed: COLONOSCOPY WITH PROPOFOL (N/A ) ESOPHAGOGASTRODUODENOSCOPY (EGD) WITH PROPOFOL (N/A )  Patient location during evaluation: Endoscopy Anesthesia Type: General Level of consciousness: awake and alert Pain management: pain level controlled Vital Signs Assessment: post-procedure vital signs reviewed and stable Respiratory status: spontaneous breathing, nonlabored ventilation, respiratory function stable and patient connected to nasal cannula oxygen Cardiovascular status: blood pressure returned to baseline and stable Postop Assessment: no apparent nausea or vomiting Anesthetic complications: no     Last Vitals:  Vitals:   08/22/17 1046 08/22/17 1050  BP: 127/86   Pulse: 87 89  Resp: 17 15  Temp:    SpO2: 94% 95%    Last Pain:  Vitals:   08/22/17 0907  PainSc: 0-No pain                 Precious Haws Taydem Cavagnaro

## 2017-08-22 NOTE — Op Note (Signed)
[facility] Gastroenterology Patient Name: Luke [name] Procedure Date: 08/22/2017 9:48 AM MRN: [id number] Account #: [id number] Date of Birth: Jun 06, 1950 Admit Type: Outpatient Age: 67 Room: [facility] ENDO ROOM 4 Gender: Male Note Status: Finalized Procedure:            Upper GI endoscopy Indications:          Dysphagia Providers:            Jonathon Bellows MD, MD Referring MD:         Olin Hauser (Referring MD) Medicines:            Monitored Anesthesia Care Complications:        No immediate complications. Procedure:            Pre-Anesthesia Assessment:                       - Prior to the procedure, a History and Physical was                        performed, and patient medications, allergies and                        sensitivities were reviewed. The patient's tolerance of                        previous anesthesia was reviewed.                       - The risks and benefits of the procedure and the                        sedation options and risks were discussed with the                        patient. All questions were answered and informed                        consent was obtained.                       - ASA Grade Assessment: III - A patient with severe                        systemic disease.                       After obtaining informed consent, the endoscope was                        passed under direct vision. Throughout the procedure,                        the patient's blood pressure, pulse, and oxygen                        saturations were monitored continuously. The Endoscope                        was introduced through the mouth, and advanced to the  third part of duodenum. The upper GI endoscopy was                        accomplished with ease. The patient tolerated the                        procedure well. Findings:      The examined duodenum was normal.      The stomach was normal.      Abnormal  motility was noted in the lower third of the esophagus. The       cricopharyngeus was abnormal. There is a decrease in motility of the       esophageal body. The distal esophagus/lower esophageal sphincter is       spastic, but gives up passage to the endoscope.      Normal mucosa was found in the entire esophagus. Biopsies were obtained       from the proximal and distal esophagus with cold forceps for histology       of suspected eosinophilic esophagitis.      The exam was otherwise without abnormality. Impression:           - Normal examined duodenum.                       - Normal stomach.                       - Abnormal esophageal motility, suspicious for                        achalasia.                       - Normal mucosa was found in the entire esophagus.                        Biopsied.                       - The examination was otherwise normal. Recommendation:       - Await pathology results.                       - Perform routine esophageal manometry in 4 days.                       - Perform a modified barium swallow in 2 weeks. Procedure Code(s):    --- Professional ---                       [phone], Esophagogastroduodenoscopy, flexible, transoral;                        with biopsy, single or multiple Diagnosis Code(s):    --- Professional ---                       K22.4, Dyskinesia of esophagus                       R13.10, Dysphagia, unspecified CPT copyright 2017 American Medical Association. All rights reserved. The codes documented in this report are preliminary and upon coder review may  be revised to meet current compliance requirements. Bailey Mech  Vicente Males, MD Jonathon Bellows MD, MD 08/22/2017 10:00:30 AM This report has been signed electronically. Number of Addenda: 0 Note Initiated On: 08/22/2017 9:48 AM      [facility]

## 2017-08-22 NOTE — Telephone Encounter (Signed)
Patient has been notified that annual lung cancer screening low dose CT scan is due currently or will be in near future. Confirmed that patient is within the age range of 55-77, and asymptomatic, (no signs or symptoms of lung cancer). Patient denies illness that would prevent curative treatment for lung cancer if found. Verified smoking history, (current, 92.25 pack year). The shared decision making visit was done 08/31/13. Patient is agreeable for CT scan being scheduled.

## 2017-08-22 NOTE — Anesthesia Post-op Follow-up Note (Signed)
Anesthesia QCDR form completed.        

## 2017-08-22 NOTE — Op Note (Signed)
[facility] Gastroenterology Patient Name: Luke [name] Procedure Date: 08/22/2017 9:48 AM MRN: [id number] Account #: [id number] Date of Birth: 03/27/1950 Admit Type: Outpatient Age: 67 Room: [facility] ENDO ROOM 4 Gender: Male Note Status: Finalized Procedure:            Colonoscopy Indications:          High risk colon cancer surveillance: Personal history                        of colonic polyps Providers:            Jonathon Bellows MD, MD Referring MD:         Olin Hauser (Referring MD) Medicines:            Monitored Anesthesia Care Complications:        No immediate complications. Procedure:            Pre-Anesthesia Assessment:                       - Prior to the procedure, a History and Physical was                        performed, and patient medications, allergies and                        sensitivities were reviewed. The patient's tolerance of                        previous anesthesia was reviewed.                       - The risks and benefits of the procedure and the                        sedation options and risks were discussed with the                        patient. All questions were answered and informed                        consent was obtained.                       - ASA Grade Assessment: III - A patient with severe                        systemic disease.                       After obtaining informed consent, the colonoscope was                        passed under direct vision. Throughout the procedure,                        the patient's blood pressure, pulse, and oxygen                        saturations were monitored continuously. The                        Colonoscope  was introduced through the anus and                        advanced to the the cecum, identified by the                        appendiceal orifice, IC valve and transillumination.                        The colonoscopy was performed with ease. The patient                      tolerated the procedure well. The quality of the bowel                        preparation was adequate. Findings:      The perianal and digital rectal examinations were normal.      Non-bleeding internal hemorrhoids were found during retroflexion. The       hemorrhoids were medium-sized and Grade I (internal hemorrhoids that do       not prolapse).      A 3 mm polyp was found in the ascending colon. The polyp was sessile.       The polyp was removed with a cold biopsy forceps. Resection and       retrieval were complete.      A 5 mm polyp was found in the sigmoid colon. The polyp was sessile. The       polyp was removed with a cold snare. Resection and retrieval were       complete.      The exam was otherwise without abnormality. Impression:           - Non-bleeding internal hemorrhoids.                       - One 3 mm polyp in the ascending colon, removed with a                        cold biopsy forceps. Resected and retrieved.                       - One 5 mm polyp in the sigmoid colon, removed with a                        cold snare. Resected and retrieved.                       - The examination was otherwise normal. Recommendation:       - Discharge patient to home (with escort).                       - Resume previous diet.                       - Continue present medications.                       - Await pathology results.                       - Repeat colonoscopy in 5 years for surveillance. Procedure Code(s):    ---  Professional ---                       [phone], Colonoscopy, flexible; with removal of tumor(s),                        polyp(s), or other lesion(s) by snare technique                       [id number], 59, Colonoscopy, flexible; with biopsy, single                        or multiple Diagnosis Code(s):    --- Professional ---                       Z86.010, Personal history of colonic polyps                       D12.2, Benign neoplasm of ascending  colon                       D12.5, Benign neoplasm of sigmoid colon                       K64.0, First degree hemorrhoids CPT copyright 2017 American Medical Association. All rights reserved. The codes documented in this report are preliminary and upon coder review may  be revised to meet current compliance requirements. Jonathon Bellows, MD Jonathon Bellows MD, MD 08/22/2017 10:21:20 AM This report has been signed electronically. Number of Addenda: 0 Note Initiated On: 08/22/2017 9:48 AM Scope Withdrawal Time: 0 hours 14 minutes 10 seconds  Total Procedure Duration: 0 hours 17 minutes 2 seconds       [facility]

## 2017-08-22 NOTE — Transfer of Care (Signed)
Immediate Anesthesia Transfer of Care Note  Patient: Luke [name]  Procedure(s) Performed: COLONOSCOPY WITH PROPOFOL (N/A ) ESOPHAGOGASTRODUODENOSCOPY (EGD) WITH PROPOFOL (N/A )  Patient Location: PACU  Anesthesia Type:General  Level of Consciousness: sedated  Airway & Oxygen Therapy: Patient Spontanous Breathing and Patient connected to nasal cannula oxygen  Post-op Assessment: Report given to RN and Post -op Vital signs reviewed and stable  Post vital signs: Reviewed and stable  Last Vitals:  Vitals Value Taken Time  BP 102/73 08/22/2017 10:28 AM  Temp 36.1 C 08/22/2017 10:28 AM  Pulse 82 08/22/2017 10:28 AM  Resp 13 08/22/2017 10:28 AM  SpO2 100 % 08/22/2017 10:28 AM  Vitals shown include unvalidated device data.  Last Pain:  Vitals:   08/22/17 0907  PainSc: 0-No pain         Complications: No apparent anesthesia complications

## 2017-08-22 NOTE — Anesthesia Preprocedure Evaluation (Signed)
Anesthesia Evaluation  Patient identified by MRN, date of birth, ID band Patient awake    Reviewed: Allergy & Precautions, H&P , NPO status , Patient's Chart, lab work & pertinent test results  History of Anesthesia Complications Negative for: history of anesthetic complications  Airway Mallampati: III  TM Distance: >3 FB Neck ROM: limited    Dental  (+) Chipped, Poor Dentition   Pulmonary neg shortness of breath, COPD, Current Smoker,           Cardiovascular Exercise Tolerance: Good hypertension, (-) angina(-) Past MI and (-) DOE      Neuro/Psych PSYCHIATRIC DISORDERS Anxiety Depression negative neurological ROS     GI/Hepatic Neg liver ROS, GERD  Medicated and Controlled,  Endo/Other  Hypothyroidism   Renal/GU negative Renal ROS  negative genitourinary   Musculoskeletal  (+) Arthritis ,   Abdominal   Peds  Hematology negative hematology ROS (+)   Anesthesia Other Findings Past Medical History: No date: Anxiety No date: Arthritis No date: Carcinoma metastatic to lymph node ([facility]) No date: Cataract     Comment:  right No date: COPD (chronic obstructive pulmonary disease) ([facility]) No date: Depression No date: GERD (gastroesophageal reflux disease) No date: Hoarseness of voice No date: Hypertension February of 2001: Personal history of tobacco use, presenting hazards  to health No date: Thyroid disease  Past Surgical History: No date: COLONOSCOPY 11/29/2016: COLONOSCOPY WITH PROPOFOL; N/A     Comment:  Procedure: COLONOSCOPY WITH PROPOFOL;  Surgeon: Manya Silvas, MD;  Location: [facility] ENDOSCOPY;  Service:               Endoscopy;  Laterality: N/A; 11/29/2016: ESOPHAGOGASTRODUODENOSCOPY (EGD) WITH PROPOFOL; N/A     Comment:  Procedure: ESOPHAGOGASTRODUODENOSCOPY (EGD) WITH               PROPOFOL;  Surgeon: Manya Silvas, MD;  Location:               [facility] ENDOSCOPY;  Service: Endoscopy;   Laterality: N/A; No date: NECK SURGERY     Comment:  status post diagnosis of cancer with chemo and radiation No date: TONSILLECTOMY  BMI    Body Mass Index:  25.73 kg/m      Reproductive/Obstetrics negative OB ROS                             Anesthesia Physical Anesthesia Plan  ASA: III  Anesthesia Plan: General   Post-op Pain Management:    Induction: Intravenous  PONV Risk Score and Plan: Propofol infusion and TIVA  Airway Management Planned: Natural Airway and Nasal Cannula  Additional Equipment:   Intra-op Plan:   Post-operative Plan:   Informed Consent: I have reviewed the patients History and Physical, chart, labs and discussed the procedure including the risks, benefits and alternatives for the proposed anesthesia with the patient or authorized representative who has indicated his/her understanding and acceptance.   Dental Advisory Given  Plan Discussed with: Anesthesiologist, CRNA and Surgeon  Anesthesia Plan Comments: (Patient consented for risks of anesthesia including but not limited to:  - adverse reactions to medications - risk of intubation if required - damage to teeth, lips or other oral mucosa - sore throat or hoarseness - Damage to heart, brain, lungs or loss of life  Patient voiced understanding.)        Anesthesia Quick Evaluation

## 2017-08-22 NOTE — Telephone Encounter (Signed)
Patient called r/t CT screening of lung due at this time.  Patient voiced understanding and agreement with CT screening.  Appointment sent to scheduling for future appointment.    

## 2017-08-22 NOTE — H&P (Signed)
Luke Bellows, MD [address], [place], [place], [place], [address] [address], [place], [place], [place], [address] Phone: [phone]  Fax: [phone]  Primary Care Physician:  Olin Hauser, DO   Pre-Procedure History & Physical: HPI:  EWARD [name] is a 67 y.o. male is here for an endoscopy and colonoscopy    Past Medical History:  Diagnosis Date  . Anxiety   . Arthritis   . Carcinoma metastatic to lymph node ([place])   . Cataract    right  . COPD (chronic obstructive pulmonary disease) ([place])   . Depression   . GERD (gastroesophageal reflux disease)   . Hoarseness of voice   . Hypertension   . Personal history of tobacco use, presenting hazards to health February of 2001  . Thyroid disease     Past Surgical History:  Procedure Laterality Date  . COLONOSCOPY    . COLONOSCOPY WITH PROPOFOL N/A 11/29/2016   Procedure: COLONOSCOPY WITH PROPOFOL;  Surgeon: Manya Silvas, MD;  Location: [facility] ENDOSCOPY;  Service: Endoscopy;  Laterality: N/A;  . ESOPHAGOGASTRODUODENOSCOPY (EGD) WITH PROPOFOL N/A 11/29/2016   Procedure: ESOPHAGOGASTRODUODENOSCOPY (EGD) WITH PROPOFOL;  Surgeon: Manya Silvas, MD;  Location: [facility] END[facility]COPY;  Service: Endoscopy;  Laterality: N/A;  . NECK SURGERY     status post diagnosis of cancer with chemo and radiation  . TONSILLECTOMY      Prior to Admission medications   Medication Sig Start Date End Date Taking? Authorizing Provider  albuterol (PROVENTIL HFA;VENTOLIN HFA) 108 (90 Base) MCG/ACT inhaler Inhale 2 puffs every 6 (six) hours as needed into the lungs for wheezing or shortness of breath.   Yes [provider]  albuterol (PROVENTIL) (2.5 MG/3ML) 0.083% nebulizer solution Take 3 mLs (2.5 mg total) by nebulization every 2 (two) hours as needed for wheezing. Dx: copd exacerbation J44.1 Patient not taking: Reported on 07/29/2017 07/03/17   Loletha Grayer, MD  levothyroxine (SYNTHROID, LEVOTHROID) 88 MCG tablet  TAKE 1 TABLET BY MOUTH ONCE A DAY ON AN EMPTY STOMACH. 07/17/14   [provider]  meloxicam (MOBIC) 15 MG tablet Take 1 tablet by mouth daily 09/14/14   [provider]  Multiple Vitamins-Minerals (MULTIVITAMIN PO) Take by mouth.    [provider]  omeprazole (PRILOSEC) 20 MG capsule TAKE ONE CAPSULE BY MOUTH EVERY DAY FOR HEART BURN 10/30/13   [provider]  polyethylene glycol-electrolytes (NULYTELY/GOLYTELY) 420 g solution Drink one 8 oz glass every 20 to 30 mins until entire container has been completed starting at 5:00pm on 08/21/17 08/18/17   Luke Bellows, MD  predniSONE (DELTASONE) 10 MG tablet Take 6 tabs with breakfast Day 1, 5 tabs Day 2, 4 tabs Day 3, 3 tabs Day 4, 2 tabs Day 5, 1 tab Day 6. 07/29/17   Olin Hauser, DO    Allergies as of 08/05/2017  . (No Known Allergies)    Family History  Problem Relation Age of Onset  . Breast cancer Mother   . Heart disease Mother   . Cancer Mother        breast, back, bladder  . Heart attack Father   . Heart disease Father     Social History   Socioeconomic History  . Marital status: Single    Spouse name: Not on file  . Number of children: Not on file  . Years of education: [employer]  . Highest education level: High school graduate  Occupational History  . Not on file  Social Needs  . Financial resource strain: Not on file  . Food insecurity:    Worry: Not on file    Inability: Not on file  . Transportation needs:    Medical: Not on file    Non-medical: Not on file  Tobacco Use  . Smoking status: Current Some Day Smoker    Packs/day: 0.25    Years: 40.00    Pack years: 10.00    Types: Cigarettes  . Smokeless tobacco: Never Used  Substance and Sexual Activity  . Alcohol use: No  . Drug use: No  . Sexual activity: Not on file  Lifestyle  . Physical activity:    Days per week: Not on file    Minutes per session: Not on file  . Stress: Not on file  Relationships  .  Social connections:    Talks on phone: Not on file    Gets together: Not on file    Attends religious service: Not on file    Active member of club or organization: Not on file    Attends meetings of clubs or organizations: Not on file    Relationship status: Not on file  . Intimate partner violence:    Fear of current or ex partner: Not on file    Emotionally abused: Not on file    Physically abused: Not on file    Forced sexual activity: Not on file  Other Topics Concern  . Not on file  Social History Narrative  . Not on file    Review of Systems: See HPI, otherwise negative ROS  Physical Exam: BP (!) 166/111   Pulse 69   Temp (!) 96.8 F (36 C)   Resp 17   Ht 6\' 1"  (1.854 m)   Wt 88.5 kg   SpO2 98%   BMI 25.73 kg/m  General:   Alert,  pleasant and cooperative in NAD Head:  Normocephalic and atraumatic. Neck:  Supple; no masses or thyromegaly. Lungs:  Clear throughout to auscultation, normal respiratory effort.    Heart:  +S1, +S2, Regular rate and rhythm, No edema. Abdomen:  Soft, nontender and nondistended. Normal bowel sounds, without guarding, and without rebound.   Neurologic:  Alert and  oriented x4;  grossly normal neurologically.  Impression/Plan: Luke [name] is here for an endoscopy and colonoscopy  to be performed for  evaluation of dysphagia and colon polyp surveillance    Risks, benefits, limitations, and alternatives regarding endoscopy and dilation have been reviewed with the patient.  Questions have been answered.  All parties agreeable.   Luke Bellows, MD  08/22/2017, 9:48 AM

## 2017-08-23 ENCOUNTER — Encounter: Payer: Self-pay | Admitting: Gastroenterology

## 2017-08-23 ENCOUNTER — Other Ambulatory Visit: Payer: Self-pay

## 2017-08-24 LAB — SURGICAL PATHOLOGY

## 2017-08-26 ENCOUNTER — Ambulatory Visit
Admission: RE | Admit: 2017-08-26 | Discharge: 2017-08-26 | Disposition: A | Discharge status: Home / Self Care | Payer: BLUE CROSS/BLUE SHIELD | Source: Ambulatory Visit | Attending: Gastroenterology | Admitting: Gastroenterology

## 2017-08-26 ENCOUNTER — Other Ambulatory Visit: Payer: BLUE CROSS/BLUE SHIELD

## 2017-08-26 ENCOUNTER — Other Ambulatory Visit: Payer: Self-pay | Admitting: Gastroenterology

## 2017-08-26 DIAGNOSIS — R1313 Dysphagia, pharyngeal phase: Secondary | ICD-10-CM | POA: Insufficient documentation

## 2017-08-26 DIAGNOSIS — R1310 Dysphagia, unspecified: Secondary | ICD-10-CM | POA: Diagnosis not present

## 2017-08-26 DIAGNOSIS — Z0000 Encounter for general adult medical examination without abnormal findings: Secondary | ICD-10-CM

## 2017-08-26 DIAGNOSIS — R05 Cough: Secondary | ICD-10-CM | POA: Diagnosis not present

## 2017-08-26 DIAGNOSIS — I10 Essential (primary) hypertension: Secondary | ICD-10-CM

## 2017-08-26 DIAGNOSIS — Z1159 Encounter for screening for other viral diseases: Secondary | ICD-10-CM

## 2017-08-26 DIAGNOSIS — R7309 Other abnormal glucose: Secondary | ICD-10-CM | POA: Diagnosis not present

## 2017-08-26 DIAGNOSIS — E039 Hypothyroidism, unspecified: Secondary | ICD-10-CM | POA: Diagnosis not present

## 2017-08-26 DIAGNOSIS — J432 Centrilobular emphysema: Secondary | ICD-10-CM

## 2017-08-26 DIAGNOSIS — Z125 Encounter for screening for malignant neoplasm of prostate: Secondary | ICD-10-CM | POA: Diagnosis not present

## 2017-08-26 DIAGNOSIS — Z8579 Personal history of other malignant neoplasms of lymphoid, hematopoietic and related tissues: Secondary | ICD-10-CM

## 2017-08-26 DIAGNOSIS — E7800 Pure hypercholesterolemia, unspecified: Secondary | ICD-10-CM | POA: Diagnosis not present

## 2017-08-26 NOTE — Therapy (Signed)
[place], [place], [address] Phone: [phone]   Fax:     Modified Barium Swallow  Patient Details  Name: Luke [name] MRN: [id number] Date of Birth: [birth year] No data recorded  Encounter Date: 08/26/2017  End of Session - 08/26/17 1451    Visit Number  1    Number of Visits  1    Date for SLP Re-Evaluation  08/26/17       Past Medical History:  Diagnosis Date  . Anxiety   . Arthritis   . Carcinoma metastatic to lymph node ([place])   . Cataract    right  . COPD (chronic obstructive pulmonary disease) ([place])   . Depression   . GERD (gastroesophageal reflux disease)   . Hoarseness of voice   . Hypertension   . Personal history of tobacco use, presenting hazards to health February of 2001  . Thyroid disease     Past Surgical History:  Procedure Laterality Date  . COLONOSCOPY    . COLONOSCOPY WITH PROPOFOL N/A 11/29/2016   Procedure: COLONOSCOPY WITH PROPOFOL;  Surgeon: Manya Silvas, MD;  Location: [facility] ENDOSCOPY;  Service: Endoscopy;  Laterality: N/A;  . COLONOSCOPY WITH PROPOFOL N/A 08/22/2017   Procedure: COLONOSCOPY WITH PROPOFOL;  Surgeon: Jonathon Bellows, MD;  Location: [facility] ENDOSCOPY;  Service: Gastroenterology;  Laterality: N/A;  . ESOPHAGOGASTRODUODENOSCOPY (EGD) WITH PROPOFOL N/A 11/29/2016   Procedure: ESOPHAGOGASTRODUODENOSCOPY (EGD) WITH PROPOFOL;  Surgeon: Manya Silvas, MD;  Location: [facility] ENDOSCOPY;  Service: Endoscopy;  Laterality: N/A;  . ESOPHAGOGASTRODUODENOSCOPY (EGD) WITH PROPOFOL N/A 08/22/2017   Procedure: ESOPHAGOGASTRODUODENOSCOPY (EGD) WITH PROPOFOL;  Surgeon: Jonathon Bellows, MD;  Location: [facility] ENDOSCOPY;  Service: Gastroenterology;  Laterality: N/A;  . NECK SURGERY     status post diagnosis of cancer with chemo and radiation  . TONSILLECTOMY      There were no vitals filed for this visit.   Subjective: Patient behavior: (alertness, ability to follow  instructions, etc.): The patient is alert, able to verbalize his symptoms and swallowing history, and follow directions  Chief complaint" Choking/coughing with swallowing.  The patient reports lymph node cancer with radiation treatment to the neck 18 years ago.  He reports having a swallowing study 10 years ago- "everything was fine".   Objective:  Radiological Procedure: A videoflouroscopic evaluation of oral-preparatory, reflex initiation, and pharyngeal phases of the swallow was performed; as well as a screening of the upper esophageal phase.  I. POSTURE: Upright in MBS chair  II. VIEW: La III. teral  IV. COMPENSATORY STRATEGIES: Voluntary cough clears aspirated and penetrated material. Double swallow reduces pharyngeal residue (but does not clear)  V. BOLUSES ADMINISTERED:   Thin Liquid: 2 small cup rim   Nectar-thick Liquid: 2 cup rim   Honey-thick Liquid: DNT   Puree: 1 teaspoon presentation   Mechanical Soft: 1/4 graham cracker in applesauce  VI. RESULTS OF EVALUATION: A. ORAL PREPARATORY PHASE: (The lips, tongue, and velum are observed for strength and coordination)       **Overall Severity Rating: Within normal limits  B. SWALLOW INITIATION/REFLEX: (The reflex is normal if "triggered" by the time the bolus reached the base of the tongue)  **Overall Severity Rating: Moderate-severe; triggers at the pyriform sinuses  C. PHARYNGEAL PHASE: (Pharyngeal function is normal if the bolus shows rapid, smooth, and continuous transit through the pharynx and there is no pharyngeal residue after the swallow)  **Overall Severity Rating: Moderate-severe; reduced hyolaryngeal excursion, absent epiglottic inversion, reduced amplitude/duration  of UES opeing, modrate-severe pharyngeal residue  D. LARYNGEAL PENETRATION: (Material entering into the laryngeal inlet/vestibule but not aspirated) Silent penetration to the vocal cords with laryngeal vestibule residue  E. ASPIRATION: Silent  aspiration  F. ESOPHAGEAL PHASE: (Screening of the upper esophagus): Patient had recent EGD  ASSESSMENT: This 67 year old man; with history of lymph node cancer and radiation treatments to the neck (18 years ago); is presenting with moderate-severe pharyngeal dysphagia characterized by delayed pharyngeal swallow initiation, reduced hyolaryngeal excursion, absent epiglottic inversion, moderate-severe pharyngeal residue, laryngeal penetration (with laryngeal vestibule residue), and silent aspiration.  A voluntary cough is effective in clearing the trachea and laryngeal vestibule of barium.  These findings are consistent with late effects of radiation on oropharyngeal swallowing musculature.  The patient is at significant risk for pulmonary compromise due to chronic aspiration.  The patient would benefit from swallowing exercise program to improve hyolaryngeal movement / reduce pharyngeal residue and education regarding potential for continued decline in swallow safety secondary late effects of radiation.  PLAN/RECOMMENDATIONS:   A. Diet: continue current diet   B. Swallowing Precautions: periodic hard cough, stringent oral care   C. Recommended consultation to: follow up with MDs as recommended    D. Therapy recommendations: recommend speech therapy referral for education and swallowing therapy   E. Results and recommendations were discussed with the patient immediately following the study and the final report routed to the referring MD.    Patient will benefit from skilled therapeutic intervention in order to improve the following deficits and impairments:   Pharyngeal dysphagia - Plan: DG OP Swallowing Func-Medicare/Speech Path, DG OP Swallowing Func-Medicare/Speech Path, CANCELED: DG Swallowing Func-Speech Pathology, CANCELED: DG Swallowing Func-Speech Pathology        Problem List Patient Active Problem List   Diagnosis Date Noted  . COPD (chronic obstructive pulmonary disease)  ([place]) 07/08/2017  . Dysphagia 07/08/2017  . Cancer of head and neck ([place]) 10/09/2015  . Personal history of tobacco use, presenting hazards to health 09/23/2014  . Pure hypercholesterolemia 09/06/2014  . Tobacco abuse 09/06/2014  . Benign essential hypertension 06/15/2013  . History of lymphoma 06/15/2013  . Esophageal reflux 06/08/2013  . Hypothyroidism 06/08/2013   Leroy Sea, MS/CCC- SLP  Lou Miner 08/26/2017, 2:52 PM  [facility] [place], [place], [address] Phone: [phone]   Fax:     Name: Luke [name] MRN: [id number] Date of Birth: 1950/02/25

## 2017-08-29 ENCOUNTER — Encounter: Payer: Self-pay | Admitting: Family Medicine

## 2017-08-29 DIAGNOSIS — R7303 Prediabetes: Secondary | ICD-10-CM | POA: Insufficient documentation

## 2017-08-29 LAB — COMPLETE METABOLIC PANEL WITH GFR
AG Ratio: 1.5 (calc) (ref 1.0–2.5)
ALKALINE PHOSPHATASE (APISO): 86 U/L (ref 40–115)
ALT: 10 U/L (ref 9–46)
AST: 20 U/L (ref 10–35)
Albumin: 4.2 g/dL (ref 3.6–5.1)
BUN: 17 mg/dL (ref 7–25)
CALCIUM: 9.5 mg/dL (ref 8.6–10.3)
CO2: 31 mmol/L (ref 20–32)
CREATININE: 1.02 mg/dL (ref 0.70–1.25)
Chloride: 103 mmol/L (ref 98–110)
GFR, Est African American: 89 mL/min/{1.73_m2} (ref >=60)
GFR, Est Non African American: 77 mL/min/{1.73_m2} (ref >=60)
GLOBULIN: 2.8 g/dL (ref 1.9–3.7)
GLUCOSE: 96 mg/dL (ref 65–99)
Potassium: 4.3 mmol/L (ref 3.5–5.3)
SODIUM: 142 mmol/L (ref 135–146)
Total Bilirubin: 0.5 mg/dL (ref 0.2–1.2)
Total Protein: 7 g/dL (ref 6.1–8.1)

## 2017-08-29 LAB — CBC WITH DIFFERENTIAL/PLATELET
BASOS PCT: 0.6 %
Basophils Absolute: 43 cells/uL (ref 0–200)
Eosinophils Absolute: 199 cells/uL (ref 15–500)
Eosinophils Relative: 2.8 %
HCT: 43.9 % (ref 38.5–50.0)
HEMOGLOBIN: 14.3 g/dL (ref 13.2–17.1)
Lymphs Abs: 1172 cells/uL (ref 850–3900)
MCH: 29.9 pg (ref 27.0–33.0)
MCHC: 32.6 g/dL (ref 32.0–36.0)
MCV: 91.8 fL (ref 80.0–100.0)
MONOS PCT: 4.8 %
MPV: 10.5 fL (ref 7.5–12.5)
NEUTROS ABS: 5346 {cells}/uL (ref 1500–7800)
Neutrophils Relative %: 75.3 %
PLATELETS: 276 10*3/uL (ref 140–400)
RBC: 4.78 10*6/uL (ref 4.20–5.80)
RDW: 13.1 % (ref 11.0–15.0)
TOTAL LYMPHOCYTE: 16.5 %
WBC mixed population: 341 cells/uL (ref 200–950)
WBC: 7.1 10*3/uL (ref 3.8–10.8)

## 2017-08-29 LAB — HEMOGLOBIN A1C
EAG (MMOL/L): 7.3 (calc)
HEMOGLOBIN A1C: 6.2 %{Hb} — AB (ref <5.7)
Mean Plasma Glucose: 131 (calc)

## 2017-08-29 LAB — LIPID PANEL
CHOL/HDL RATIO: 5.7 (calc) — AB (ref <5.0)
CHOLESTEROL: 199 mg/dL (ref <200)
HDL: 35 mg/dL — ABNORMAL LOW (ref >40)
LDL CHOLESTEROL (CALC): 142 mg/dL — AB
NON-HDL CHOLESTEROL (CALC): 164 mg/dL — AB (ref <130)
Triglycerides: 107 mg/dL (ref <150)

## 2017-08-29 LAB — TSH: TSH: 2.99 mIU/L (ref 0.40–4.50)

## 2017-08-29 LAB — T4, FREE: Free T4: 1.2 ng/dL (ref 0.8–1.8)

## 2017-08-29 LAB — HEPATITIS C ANTIBODY
HEP C AB: NONREACTIVE
SIGNAL TO CUT-OFF: 0.04 (ref <1.00)

## 2017-08-29 LAB — PSA, TOTAL WITH REFLEX TO PSA, FREE: PSA, Total: 0.5 ng/mL (ref <=4.0)

## 2017-08-30 ENCOUNTER — Telehealth: Payer: Self-pay | Admitting: *Deleted

## 2017-08-30 ENCOUNTER — Ambulatory Visit
Admission: RE | Admit: 2017-08-30 | Discharge: 2017-08-30 | Disposition: A | Discharge status: Home / Self Care | Payer: BLUE CROSS/BLUE SHIELD | Source: Ambulatory Visit | Attending: Nurse Practitioner | Admitting: Nurse Practitioner

## 2017-08-30 DIAGNOSIS — I700 Atherosclerosis of aorta: Secondary | ICD-10-CM | POA: Diagnosis not present

## 2017-08-30 DIAGNOSIS — J438 Other emphysema: Secondary | ICD-10-CM | POA: Insufficient documentation

## 2017-08-30 DIAGNOSIS — J432 Centrilobular emphysema: Secondary | ICD-10-CM | POA: Diagnosis not present

## 2017-08-30 DIAGNOSIS — I2510 Atherosclerotic heart disease of native coronary artery without angina pectoris: Secondary | ICD-10-CM | POA: Insufficient documentation

## 2017-08-30 DIAGNOSIS — Z87891 Personal history of nicotine dependence: Secondary | ICD-10-CM | POA: Insufficient documentation

## 2017-08-30 DIAGNOSIS — F17210 Nicotine dependence, cigarettes, uncomplicated: Secondary | ICD-10-CM | POA: Diagnosis not present

## 2017-08-30 DIAGNOSIS — R918 Other nonspecific abnormal finding of lung field: Secondary | ICD-10-CM | POA: Insufficient documentation

## 2017-08-30 DIAGNOSIS — Z122 Encounter for screening for malignant neoplasm of respiratory organs: Secondary | ICD-10-CM | POA: Insufficient documentation

## 2017-08-30 NOTE — Telephone Encounter (Signed)
Called patient and discussed results of the following impression with him.  He reports that he has a history of pneumonia 1 month ago and does aspirate his food and liquid and that is a known problem for him.   Informed patient that Dr. Manuella Ghazi and Dr. Vella Kohler will be notified of the results.  Encouraged patient to follow up with PCP or pulmonology as needed and that we would be back in touch for the 3 month recommended follow up. Voiced understanding.    IMPRESSION: 1. Multiple new nodular appearing areas of what appear to be airspace consolidation are noted in the lungs bilaterally. Based on strict size criteria, the largest of these is categorized technically as Lung-RADS 4BS, suspicious. However, given the high likelihood of an infectious or inflammatory etiology (rather than malignancy), follow up low-dose chest CT without contrast in 3 months (please use the following order, "CT CHEST LCS NODULE FOLLOW-UP W/O CM") is recommended. 2. The "S" modifier above refers to potentially clinically significant non lung cancer related findings. Specifically, there is aortic atherosclerosis, in addition to left main and left anterior descending coronary artery disease. Please note that although the presence of coronary artery calcium documents the presence of coronary artery disease, the severity of this disease and any potential stenosis cannot be assessed on this non-gated CT examination. Assessment for potential risk factor modification, dietary therapy or pharmacologic therapy may be warranted, if clinically indicated. 3. Mild diffuse bronchial wall thickening with mild centrilobular and paraseptal emphysema; imaging findings suggestive of underlying COPD.  These results will be called to the ordering clinician or representative by the Radiologist Assistant, and communication documented in the PACS or zVision Dashboard.  Aortic Atherosclerosis (ICD10-I70.0) and Emphysema (ICD10-J43.9).

## 2017-08-30 NOTE — Telephone Encounter (Signed)
Addendum to previous note:  Called patient and discussed results of the following impression with him.  He reports that he has a history of pneumonia 1 month ago and does aspirate his food and liquid and that is a known problem for him.   Informed patient that his PCPwill be notified of the results.  Encouraged patient to follow up with PCP  as needed and that we would be back in touch for the 3 month recommended follow up. Voiced understanding.    IMPRESSION: 1. Multiple new nodular appearing areas of what appear to be airspace consolidation are noted in the lungs bilaterally. Based on strict size criteria, the largest of these is categorized technically as Lung-RADS 4BS, suspicious. However, given the high likelihood of an infectious or inflammatory etiology (rather than malignancy), follow up low-dose chest CT without contrast in 3 months (please use the following order, "CT CHEST LCS NODULE FOLLOW-UP W/O CM") is recommended. 2. The "S" modifier above refers to potentially clinically significant non lung cancer related findings. Specifically, there is aortic atherosclerosis, in addition to left main and left anterior descending coronary artery disease. Please note that although the presence of coronary artery calcium documents the presence of coronary artery disease, the severity of this disease and any potential stenosis cannot be assessed on this non-gated CT examination. Assessment for potential risk factor modification, dietary therapy or pharmacologic therapy may be warranted, if clinically indicated. 3. Mild diffuse bronchial wall thickening with mild centrilobular and paraseptal emphysema; imaging findings suggestive of underlying COPD.  These results will be called to the ordering clinician or representative by the Radiologist Assistant, and communication documented in the PACS or zVision Dashboard.  Aortic Atherosclerosis (ICD10-I70.0) and Emphysema (ICD10-J43.9).

## 2017-09-01 ENCOUNTER — Ambulatory Visit (INDEPENDENT_AMBULATORY_CARE_PROVIDER_SITE_OTHER): Payer: BLUE CROSS/BLUE SHIELD | Admitting: Family Medicine

## 2017-09-01 ENCOUNTER — Encounter: Payer: Self-pay | Admitting: Family Medicine

## 2017-09-01 VITALS — BP 102/70 | HR 72 | Temp 98.7°F | Resp 16 | Ht 73.0 in | Wt 193.0 lb

## 2017-09-01 DIAGNOSIS — M791 Myalgia, unspecified site: Secondary | ICD-10-CM

## 2017-09-01 DIAGNOSIS — I10 Essential (primary) hypertension: Secondary | ICD-10-CM | POA: Diagnosis not present

## 2017-09-01 DIAGNOSIS — Z0000 Encounter for general adult medical examination without abnormal findings: Secondary | ICD-10-CM

## 2017-09-01 DIAGNOSIS — J432 Centrilobular emphysema: Secondary | ICD-10-CM

## 2017-09-01 DIAGNOSIS — R1313 Dysphagia, pharyngeal phase: Secondary | ICD-10-CM

## 2017-09-01 DIAGNOSIS — E7800 Pure hypercholesterolemia, unspecified: Secondary | ICD-10-CM

## 2017-09-01 DIAGNOSIS — G720 Drug-induced myopathy: Secondary | ICD-10-CM | POA: Insufficient documentation

## 2017-09-01 DIAGNOSIS — T466X5A Adverse effect of antihyperlipidemic and antiarteriosclerotic drugs, initial encounter: Secondary | ICD-10-CM

## 2017-09-01 DIAGNOSIS — R7309 Other abnormal glucose: Secondary | ICD-10-CM

## 2017-09-01 DIAGNOSIS — E039 Hypothyroidism, unspecified: Secondary | ICD-10-CM

## 2017-09-01 NOTE — Assessment & Plan Note (Signed)
Intolerance to statin in past with myalgia Offered today he declined

## 2017-09-01 NOTE — Progress Notes (Signed)
Subjective:    Patient ID: Luke [name], male    DOB: 05-09-1950, 67 y.o.   MRN: [id number]  Luke [name] is a 67 y.o. male presenting on 09/01/2017 for Annual Exam   HPI   Here for Annual Physical and Lab Review.  CHRONIC HTN: Reports he checks BP at home, stable readings since prior visit. Current Meds - NONE   Lifestyle: - Diet: adds salt to eggs now to help his BP, tries to eat healthy, drinks half gallon of water most days - Exercise: less active recently due to illness but improving activity  Elevated A1c vs PreDM Reports no prior concern. He was unaware. Recent A1c 6.2 CBGs: Not checking Meds: None Currently not ACEi / ARB Lifestyle: - Diet (Admits high sugar carb diet, sweets, candy bars, bread etc)  - Exercise (Stays active, steps) Denies hypoglycemia  HYPERLIPIDEMIA: - Reports no concerns. Last lipid panel 08/2017, mixed result with mild low HDL and elevated LDL and normal TG - Not taking statin or cholesterol med. He has history of prior statin use, unsure which and he did not tolerate it had myalgias  Follow-up COPD / Tobacco Abuse / Dysphagia history of aspiration Last visit with Pulmonology outpatient, [place] Dr Juanell Fairly on 07/19/17, dx with COPD/emphysema, recent hospitalization with aspiration pneumonia, see report. He was advised to not start any new inhalers, he has a back up albuterol PRN but not needed this since that time.  - He remains on low amt cigarettes, smoking 4-5 cigarettes a day now, still remains reduced, no longer 2ppd - Regarding aspiration pneumonia and dysphagia, see below - Today he reports he had Low Dose CT imaging 08/30/17 for screening, showed significant pulmonary nodules, repeat 3 months. They notified him of results but not what to do next. He has not discussed this with his pulmonologist yet. His next apt with PFTs in 12/2017. - he still has coughing spells with eating Overall breathing better Denies dyspnea, fever chills or  recurrence  Follow-up Dysphagia (Moderate-severe pharyngeal phase) Recently had MBS swallow study 08/2017, and SLP evaluation, reviewed their detailed information, difficulty with delayed pharyngeal swallow initiation and partly due to prior lymph node radiation neck area, he has abnormal epiglottic anatomy, thought to be late effects of radiation affecting oropharyngeal musculature, at high risk for future pulmonary compromise due to chronic aspiration. - He was advised benefit from SLP therapy to improve movement. They did not advise any surgical intervention, however he has not returned yet to [facility] Dr Vicente Males, scheduled on 09/14/17 - Today he is asking for 2nd opinion and what to do next  Health Maintenance:  Prostate CA Screening: Last PSA 0.5 (08/2017). Currently asymptomatic without known BPH LUTS. No known family history of prostate CA.   Due for Flu vaccine - will return once in stock.  UTD on Pneumonia vaccine Prevnar-13 07/02/17, next due Pneumovax-23 in 06/2018  Colon CA Screening: Last Colonoscopy (done by Dr Vicente Males [facility]), results with x 2 polyp benign, good for 5 years. Currently asymptomatic. No known family history of colon CA.  UTD Routine HIV and Hep C screening, negative 2019  Depression screen Luke [name] 2/9 09/01/2017 07/08/2017 07/01/2017  Decreased Interest 0 0 0  Down, Depressed, Hopeless 0 0 0  PHQ - 2 Score 0 0 0    Past Medical History:  Diagnosis Date  . Anxiety   . Arthritis   . Carcinoma metastatic to lymph node ([place])   . Cataract    right  . COPD (  chronic obstructive pulmonary disease) ([place])   . Depression   . GERD (gastroesophageal reflux disease)   . Hoarseness of voice   . Personal history of tobacco use, presenting hazards to health February of 2001  . Thyroid disease    Past Surgical History:  Procedure Laterality Date  . COLONOSCOPY    . COLONOSCOPY WITH PROPOFOL N/A 11/29/2016   Procedure: COLONOSCOPY WITH PROPOFOL;  Surgeon: Manya Silvas, MD;  Location:  [facility] ENDOSCOPY;  Service: Endoscopy;  Laterality: N/A;  . COLONOSCOPY WITH PROPOFOL N/A 08/22/2017   Procedure: COLONOSCOPY WITH PROPOFOL;  Surgeon: Jonathon Bellows, MD;  Location: [facility] ENDOSCOPY;  Service: Gastroenterology;  Laterality: N/A;  . ESOPHAGOGASTRODUODENOSCOPY (EGD) WITH PROPOFOL N/A 11/29/2016   Procedure: ESOPHAGOGASTRODUODENOSCOPY (EGD) WITH PROPOFOL;  Surgeon: Manya Silvas, MD;  Location: [facility] ENDOSCOPY;  Service: Endoscopy;  Laterality: N/A;  . ESOPHAGOGASTRODUODENOSCOPY (EGD) WITH PROPOFOL N/A 08/22/2017   Procedure: ESOPHAGOGASTRODUODENOSCOPY (EGD) WITH PROPOFOL;  Surgeon: Jonathon Bellows, MD;  Location: [facility] ENDOSCOPY;  Service: Gastroenterology;  Laterality: N/A;  . NECK SURGERY     status post diagnosis of cancer with chemo and radiation  . TONSILLECTOMY     Social History   Socioeconomic History  . Marital status: Single    Spouse name: Not on file  . Number of children: Not on file  . Years of education: [employer]  . Highest education level: High school graduate  Occupational History  . Not on file  Social Needs  . Financial resource strain: Not on file  . Food insecurity:    Worry: Not on file    Inability: Not on file  . Transportation needs:    Medical: Not on file    Non-medical: Not on file  Tobacco Use  . Smoking status: Current Some Day Smoker    Packs/day: 0.25    Years: 40.00    Pack years: 10.00    Types: Cigarettes  . Smokeless tobacco: Current User  Substance and Sexual Activity  . Alcohol use: No  . Drug use: No  . Sexual activity: Not on file  Lifestyle  . Physical activity:    Days per week: Not on file    Minutes per session: Not on file  . Stress: Not on file  Relationships  . Social connections:    Talks on phone: Not on file    Gets together: Not on file    Attends religious service: Not on file    Active member of club or organization: Not on file    Attends meetings of clubs or organizations: Not on file    Relationship  status: Not on file  . Intimate partner violence:    Fear of current or ex partner: Not on file    Emotionally abused: Not on file    Physically abused: Not on file    Forced sexual activity: Not on file  Other Topics Concern  . Not on file  Social History Narrative  . Not on file   Family History  Problem Relation Age of Onset  . Breast cancer Mother   . Heart disease Mother   . Cancer Mother        breast, back, bladder  . Heart attack Father   . Heart disease Father    Current Outpatient Medications on File Prior to Visit  Medication Sig  . albuterol (PROVENTIL HFA;VENTOLIN HFA) 108 (90 Base) MCG/ACT inhaler Inhale 2 puffs every 6 (six) hours as needed into the lungs for wheezing or shortness of  breath.  Marland Kitchen albuterol (PROVENTIL) (2.5 MG/3ML) 0.083% nebulizer solution Take 3 mLs (2.5 mg total) by nebulization every 2 (two) hours as needed for wheezing. Dx: copd exacerbation J44.1  . levothyroxine (SYNTHROID, LEVOTHROID) 88 MCG tablet TAKE 1 TABLET BY MOUTH ONCE A DAY ON AN EMPTY STOMACH.  . meloxicam (MOBIC) 15 MG tablet Take 1 tablet by mouth daily  . Multiple Vitamins-Minerals (MULTIVITAMIN PO) Take by mouth.  Marland Kitchen omeprazole (PRILOSEC) 20 MG capsule TAKE ONE CAPSULE BY MOUTH EVERY DAY FOR HEART BURN   No current facility-administered medications on file prior to visit.     Review of Systems  Constitutional: Negative for activity change, appetite change, chills, diaphoresis, fatigue and fever.  HENT: Positive for trouble swallowing. Negative for congestion and hearing loss.   Eyes: Negative for visual disturbance.  Respiratory: Positive for choking (swallowing dysfunction, unchanged). Negative for apnea, cough, chest tightness, shortness of breath and wheezing.   Cardiovascular: Negative for chest pain, palpitations and leg swelling.  Gastrointestinal: Negative for abdominal pain, anal bleeding, blood in stool, constipation, diarrhea, nausea and vomiting.  Endocrine: Negative  for cold intolerance.  Genitourinary: Negative for decreased urine volume, difficulty urinating, dysuria, frequency and hematuria.  Musculoskeletal: Negative for arthralgias, back pain and neck pain.  Skin: Negative for rash.  Allergic/Immunologic: Negative for environmental allergies.  Neurological: Negative for dizziness, weakness, light-headedness, numbness and headaches.  Hematological: Negative for adenopathy.  Psychiatric/Behavioral: Negative for behavioral problems, dysphoric mood and sleep disturbance. The patient is not nervous/anxious.    Per HPI unless specifically indicated above      Objective:    BP 102/70   Pulse 72   Temp 98.7 F (37.1 C) (Oral)   Resp 16   Ht 6\' 1"  (1.854 m)   Wt 193 lb (87.5 kg)   BMI 25.46 kg/m   Wt Readings from Last 3 Encounters:  09/01/17 193 lb (87.5 kg)  08/30/17 195 lb (88.5 kg)  08/22/17 195 lb (88.5 kg)    Physical Exam  Constitutional: He is oriented to person, place, and time. He appears well-developed and well-nourished. No distress.  Well-appearing, comfortable, cooperative  HENT:  Head: Normocephalic and atraumatic.  Mouth/Throat: Oropharynx is clear and moist.  Eyes: Pupils are equal, round, and reactive to light. Conjunctivae and EOM are normal. Right eye exhibits no discharge. Left eye exhibits no discharge.  Neck: Normal range of motion. Neck supple. No thyromegaly present.  Cardiovascular: Normal rate, regular rhythm, normal heart sounds and intact distal pulses.  No murmur heard. Pulmonary/Chest: Effort normal and breath sounds normal. No respiratory distress. He has no wheezes. He has no rales.  Significantly improved. Good air movement today. No wheezing. No coughing.  Abdominal: Soft. Bowel sounds are normal. He exhibits no distension and no mass. There is no tenderness.  Musculoskeletal: Normal range of motion. He exhibits no edema or tenderness.  Upper / Lower Extremities: - Normal muscle tone, strength bilateral  upper extremities 5/5, lower extremities 5/5  Lymphadenopathy:    He has no cervical adenopathy.  Neurological: He is alert and oriented to person, place, and time.  Distal sensation intact to light touch all extremities  Skin: Skin is warm and dry. No rash noted. He is not diaphoretic. No erythema.  Psychiatric: He has a normal mood and affect. His behavior is normal.  Well groomed, good eye contact, normal speech and thoughts  Nursing note and vitals reviewed.  Results for orders placed or performed in visit on 08/26/17  Hepatitis C antibody  Result Value Ref Range   Hepatitis C Ab NON-REACTIVE NON-REACTI   SIGNAL TO CUT-OFF 0.04 <1.00  T4, free  Result Value Ref Range   Free T4 1.2 0.8 - 1.8 ng/dL  TSH  Result Value Ref Range   TSH 2.99 0.40 - 4.50 mIU/L  PSA, Total with Reflex to PSA, Free  Result Value Ref Range   PSA, Total 0.5 < OR = 4.0 ng/mL  Lipid panel  Result Value Ref Range   Cholesterol 199 <200 mg/dL   HDL 35 (L) >40 mg/dL   Triglycerides 107 <150 mg/dL   LDL Cholesterol (Calc) 142 (H) mg/dL (calc)   Total CHOL/HDL Ratio 5.7 (H) <5.0 (calc)   Non-HDL Cholesterol (Calc) 164 (H) <130 mg/dL (calc)  COMPLETE METABOLIC PANEL WITH GFR  Result Value Ref Range   Glucose, Bld 96 65 - 99 mg/dL   BUN 17 7 - 25 mg/dL   Creat 1.02 0.70 - 1.25 mg/dL   GFR, Est Non African American 77 > OR = 60 mL/min/1.97m2   GFR, Est African American 89 > OR = 60 mL/min/1.53m2   BUN/Creatinine Ratio NOT APPLICABLE 6 - 22 (calc)   Sodium 142 135 - 146 mmol/L   Potassium 4.3 3.5 - 5.3 mmol/L   Chloride 103 98 - 110 mmol/L   CO2 31 20 - 32 mmol/L   Calcium 9.5 8.6 - 10.3 mg/dL   Total Protein 7.0 6.1 - 8.1 g/dL   Albumin 4.2 3.6 - 5.1 g/dL   Globulin 2.8 1.9 - 3.7 g/dL (calc)   AG Ratio 1.5 1.0 - 2.5 (calc)   Total Bilirubin 0.5 0.2 - 1.2 mg/dL   Alkaline phosphatase (APISO) 86 40 - 115 U/L   AST 20 10 - 35 U/L   ALT 10 9 - 46 U/L  CBC with Differential/Platelet  Result Value Ref  Range   WBC 7.1 3.8 - 10.8 Thousand/uL   RBC 4.78 4.20 - 5.80 Million/uL   Hemoglobin 14.3 13.2 - 17.1 g/dL   HCT 43.9 38.5 - 50.0 %   MCV 91.8 80.0 - 100.0 fL   MCH 29.9 27.0 - 33.0 pg   MCHC 32.6 32.0 - 36.0 g/dL   RDW 13.1 11.0 - 15.0 %   Platelets 276 140 - 400 Thousand/uL   MPV 10.5 7.5 - 12.5 fL   Neutro Abs 5,346 1,500 - 7,800 cells/uL   Lymphs Abs 1,172 850 - 3,900 cells/uL   WBC mixed population 341 200 - 950 cells/uL   Eosinophils Absolute 199 15 - 500 cells/uL   Basophils Absolute 43 0 - 200 cells/uL   Neutrophils Relative % 75.3 %   Total Lymphocyte 16.5 %   Monocytes Relative 4.8 %   Eosinophils Relative 2.8 %   Basophils Relative 0.6 %  Hemoglobin A1c  Result Value Ref Range   Hgb A1c MFr Bld 6.2 (H) <5.7 % of total Hgb   Mean Plasma Glucose 131 (calc)   eAG (mmol/L) 7.3 (calc)      Assessment & Plan:   Problem List Items Addressed This Visit    Benign essential hypertension    Improved BP, stable without anti HTN meds Remains low normal Home BP stable No known complications     Plan:  1. Encourage improved lifestyle - continue with small amount of salt to meal in morning, otherwise low sodium diet, regular activity, continue hydration 2. Continue monitor BP outside office, bring readings to next visit, if persistently >140/90 or new symptoms notify office sooner -  or notify if low BP < 90 3. Follow-up 3 months      COPD (chronic obstructive pulmonary disease) (HCC)    Stable without exacerbation, significantly improved breathing. Suspected COPD emphysema based on clinical and recent Low Dose CT Followed by [facility] No longer on maintenance inhalers Future PFTs in 12/2017  Plan Reviewed results of CT Chest - concerning nodules and abnormalities - requested repeat in 3 months, question infectious nature most likely, advised patient to contact his Pulmonologist to review result further and see if they can repeat CT in 3 months to follow this  result. Otherwise, he may notify us and we can order repeat CT  Remain off Prednisone now Aspiration precautions - reviewed with dysphagia / SLP see A&P  Follow-up if not improve or return to Pulm sooner than several months if worsening - may need back on maintenance inhalers      Dysphagia, pharyngeal phase    Moderate to severe pharyngeal phase dysphagia secondary to neck radiation treatment in past - Etiology of chronic aspiration, high risk in future - Identified on MBS swallow 08/2017 Followed by [facility] Dr Vicente Males, will return 9/4 for results and consult Encouraged patient to continue with the recommend SLP plan to work on muscles with swallowing and goal to prevent problem, he was discouraged that limited surgical options at this time - asked him to discuss with GI for now      Elevated hemoglobin A1c    Elevated A1c to 6.2, relatively new diagnosis Poor diet high carb/sugar  Plan:  1. Not on any therapy currently  2. Encourage improved lifestyle - low carb, low sugar diet, reduce portion size, continue improving activity future regular exercise - Diet handout given 3. Follow-up 3 months A1c       Hypothyroidism    Controlled hypothyroidism Normal TSH Continue current Levothyroxine 92mcg daily      Myalgia due to statin    Intolerance to statin in past with myalgia Offered today he declined      Pure hypercholesterolemia    Mostly controlled cholesterol with some poor lifestyle Last lipid panel 08/2017 Calculated ASCVD 10 yr risk score elevated  Plan: 1. Not on statin - declines today, discussed ASCVD risk 2. Encourage improved lifestyle - low carb/cholesterol, reduce portion size, continue improving regular exercise       Other Visit Diagnoses    Annual physical exam    -  Primary      Updated Health Maintenance information - Return for flu shot when in stock Reviewed recent lab results with patient Encouraged improvement to lifestyle with diet and  exercise   No orders of the defined types were placed in this encounter.   Follow up plan: Return in about 3 months (around 12/02/2017) for PreDM A1c, COPD.  Nobie Putnam, [facility] 09/01/2017, 5:26 PM

## 2017-09-01 NOTE — Assessment & Plan Note (Signed)
Moderate to severe pharyngeal phase dysphagia secondary to neck radiation treatment in past - Etiology of chronic aspiration, high risk in future - Identified on MBS swallow 08/2017 Followed by [facility] Dr Vicente Males, will return 9/4 for results and consult Encouraged patient to continue with the recommend SLP plan to work on muscles with swallowing and goal to prevent problem, he was discouraged that limited surgical options at this time - asked him to discuss with GI for now

## 2017-09-01 NOTE — Assessment & Plan Note (Signed)
Mostly controlled cholesterol with some poor lifestyle Last lipid panel 08/2017 Calculated ASCVD 10 yr risk score elevated  Plan: 1. Not on statin - declines today, discussed ASCVD risk 2. Encourage improved lifestyle - low carb/cholesterol, reduce portion size, continue improving regular exercise

## 2017-09-01 NOTE — Patient Instructions (Addendum)
Thank you for coming to the office today.  Follow-up with Dr Vicente Males 9/4 - GI to discuss swallowing study and possible therapy options or consider 2nd opinion if you are concerned about their options. However, I would give them time to try to help.  Next - the result of the CT Lungs showed nodules and some inflammatory changes and COPD. I recommend contacting your Pulmonologist Dr Juanell Fairly to review these results again and see if they will repeat it within 3 months as asked.  Dr Georgianne Fick [facility] Office Number: [phone]  - If they do not order the repeat CT scan - call me and I can order it -   Please schedule and return for a NURSE ONLY VISIT for VACCINE - Approximately around October 2019 - Need High Dose Flu Vaccine - Due for Shingles (Shingrix Vaccine - new one - can get 2 doses few months apart at Pharmacy) - In [place] - 06/2018 due for 2nd pneumonia vaccine - Pneumovax-23 - 2nd final dose  Please schedule a Follow-up Appointment to: Return in about 3 months (around 12/02/2017) for PreDM A1c, COPD.  If you have any other questions or concerns, please feel free to call the office or send a message through [place]. You may also schedule an earlier appointment if necessary.  Additionally, you may be receiving a survey about your experience at our office within a few days to 1 week by e-mail or mail. We value your feedback.  Nobie Putnam, DO [place]

## 2017-09-01 NOTE — Assessment & Plan Note (Signed)
Improved BP, stable without anti HTN meds Remains low normal Home BP stable No known complications     Plan:  1. Encourage improved lifestyle - continue with small amount of salt to meal in morning, otherwise low sodium diet, regular activity, continue hydration 2. Continue monitor BP outside office, bring readings to next visit, if persistently >140/90 or new symptoms notify office sooner - or notify if low BP < 90 3. Follow-up 3 months

## 2017-09-01 NOTE — Assessment & Plan Note (Signed)
Stable without exacerbation, significantly improved breathing. Suspected COPD emphysema based on clinical and recent Low Dose CT Followed by [facility] Pulmonology No longer on maintenance inhalers Future PFTs in 12/2017  Plan Reviewed results of CT Chest - concerning nodules and abnormalities - requested repeat in 3 months, question infectious nature most likely, advised patient to contact his Pulmonologist to review result further and see if they can repeat CT in 3 months to follow this result. Otherwise, he may notify us and we can order repeat CT  Remain off Prednisone now Aspiration precautions - reviewed with dysphagia / SLP see A&P  Follow-up if not improve or return to Pulm sooner than several months if worsening - may need back on maintenance inhalers

## 2017-09-01 NOTE — Assessment & Plan Note (Signed)
Controlled hypothyroidism Normal TSH Continue current Levothyroxine 1mcg daily

## 2017-09-01 NOTE — Assessment & Plan Note (Signed)
Elevated A1c to 6.2, relatively new diagnosis Poor diet high carb/sugar  Plan:  1. Not on any therapy currently  2. Encourage improved lifestyle - low carb, low sugar diet, reduce portion size, continue improving activity future regular exercise - Diet handout given 3. Follow-up 3 months A1c

## 2017-09-06 ENCOUNTER — Encounter: Payer: Self-pay | Admitting: Gastroenterology

## 2017-09-06 NOTE — Progress Notes (Signed)
[name] : Please refer to speech therapy for swallowing therapy and education RE: late effects of radiation.

## 2017-09-08 ENCOUNTER — Other Ambulatory Visit: Payer: Self-pay

## 2017-09-13 ENCOUNTER — Other Ambulatory Visit: Payer: Self-pay

## 2017-09-13 ENCOUNTER — Telehealth: Payer: Self-pay

## 2017-09-13 DIAGNOSIS — R1310 Dysphagia, unspecified: Secondary | ICD-10-CM

## 2017-09-13 NOTE — Telephone Encounter (Signed)
-----   Message from Jonathon Bellows, MD sent at 09/06/2017 11:51 AM EDT -----   ----- Message ----- From: Wyline Beady Sent: 08/26/2017   3:07 PM EDT To: Olin Hauser, DO, Jonathon Bellows, MD  MBS report for Luke [name], DOB [birth year].  I recommend referral to speech therapy for swallowing therapy and education RE: late effects of radiation.

## 2017-09-13 NOTE — Telephone Encounter (Signed)
Spoke with pt about modified barium swallow results and Dr. Georgeann Oppenheim instructions for pt to be referred for speech therapy due to results. Pt has been informed that we have referred him to [facility] rehab services, who will contact pt to schedule appointment.

## 2017-09-14 ENCOUNTER — Ambulatory Visit (INDEPENDENT_AMBULATORY_CARE_PROVIDER_SITE_OTHER): Payer: BLUE CROSS/BLUE SHIELD | Admitting: Gastroenterology

## 2017-09-14 ENCOUNTER — Encounter: Payer: Self-pay | Admitting: Gastroenterology

## 2017-09-14 VITALS — BP 87/62 | HR 72 | Ht 73.0 in | Wt 195.2 lb

## 2017-09-14 DIAGNOSIS — R1310 Dysphagia, unspecified: Secondary | ICD-10-CM | POA: Diagnosis not present

## 2017-09-14 NOTE — Progress Notes (Signed)
Luke Bellows MD, MRCP(U.K) [address]  [place]  [place], [place] [address]  Main: [phone]  Fax: [phone]   Primary Care Physician: Olin Hauser, DO  Primary Gastroenterologist:  Dr. Jonathon [name]   No chief complaint on file.   HPI: Luke [name] is a 67 y.o. male    Summary of history :  He is here today to follow-up with initial visit on 08/03/2017 when he was referred for dysphagia and aspiration.  He says that the dysphagia has been ongoing for a few years and getting worse.  Occurs every day whenever he eats has coughing and feels the food goes down the wrong way.  Mostly affects solids and steak does not feel that it gets stuck.  He states that he drinks and it does not go down goes outside bends over and it comes out.  Occasionally food consumed a long time back also comes out.  He has a history of reflux.  He had been on Prilosec every day morning.  He has had radiation to his neck 18 years back and affected his salivary glands and since then produces minimal saliva.   He had been seen by DR Luke [name] in 08/2016 for constipation , dysphagia for solids over a year , chest discomfort. He had an EGD and a schtzkis ring was stretched to 16 mm , no subsequent follow up , no biopsies taken to rule out EOE. A colonoscopy was done at the same time and a tubular adenoma was excised. Poor prep and repeat colonoscopy suggested.   Interval history   08/03/2017-09/14/2017    08/22/2017: Upper endoscopy:Abnormal motility was noted in the lower third of the esophagus. There was a decrease inmotility of  the esophageal body. The distal esophagus/lower esophageal sphincter was spastic, but gives up passage to the endoscope.Normal mucosa was found in the entire esophagus. Biopsies were obtained and showed features of reflux esophagitis.  08/22/2017: Colonoscopy: 2 tiny polyps were resected.  1 of which was a tubular adenoma and the other one was a hyperplastic  polyp.  08/26/2017 modified barium swallow showed prominent retention with multiple substances aspiration of thin liquid noted.  Speech pathology mentioned tmoderate laryngeal excursion, absent epiglottic inversion, reduced amplitude duration of UES opening, moderate to severe pharyngeal residue.  Silent penetration to the vocal cords with laryngeal vestibule residue, silent aspiration.  Has delayed effects of radiation on oropharyngeal following musculature.  Patient with a significant risk for pulmonary compromise due to chronic sedation.  He would benefit from swallowing exercise program to improve hypo-laryngeal motility reduce pharyngeal residue and education regarding potential benefits for continued decline in swallow  secondary late effects of radiation  Still has issues with swallowing  Current Outpatient Medications  Medication Sig Dispense Refill  . albuterol (PROVENTIL HFA;VENTOLIN HFA) 108 (90 Base) MCG/ACT inhaler Inhale 2 puffs every 6 (six) hours as needed into the lungs for wheezing or shortness of breath.    Marland Kitchen albuterol (PROVENTIL) (2.5 MG/3ML) 0.083% nebulizer solution Take 3 mLs (2.5 mg total) by nebulization every 2 (two) hours as needed for wheezing. Dx: copd exacerbation J44.1 100 vial 0  . levothyroxine (SYNTHROID, LEVOTHROID) 88 MCG tablet TAKE 1 TABLET BY MOUTH ONCE A DAY ON AN EMPTY STOMACH.    . meloxicam (MOBIC) 15 MG tablet Take 1 tablet by mouth daily  5  . Multiple Vitamins-Minerals (MULTIVITAMIN PO) Take by mouth.    Marland Kitchen omeprazole (PRILOSEC) 20 MG capsule TAKE ONE CAPSULE BY MOUTH EVERY DAY  FOR [name]     No current facility-administered medications for this visit.     Allergies as of 09/14/2017  . (No Known Allergies)    ROS:  General: Negative for anorexia, weight loss, fever, chills, fatigue, weakness. ENT: Negative for hoarseness, difficulty swallowing , nasal congestion. CV: Negative for chest pain, angina, palpitations, dyspnea on exertion, peripheral  edema.  Respiratory: Negative for dyspnea at rest, dyspnea on exertion, cough, sputum, wheezing.  GI: See history of present illness. GU:  Negative for dysuria, hematuria, urinary incontinence, urinary frequency, nocturnal urination.  Endo: Negative for unusual weight change.    Physical Examination:   There were no vitals taken for this visit.  General: Well-nourished, well-developed in no acute distress.  Eyes: No icterus. Conjunctivae pink. Mouth: Oropharyngeal mucosa moist and pink , no lesions erythema or exudate. Lungs: Clear to auscultation bilaterally. Non-labored. [name]: Regular rate and rhythm, no murmurs rubs or gallops.  Abdomen: soft no tenderness,no guarding or rigidity   Extremities: No lower extremity edema. No clubbing or deformities. Neuro: Alert and oriented x 3.  Grossly intact. Skin: Warm and dry, no jaundice.   Psych: Alert and cooperative, normal mood and affect.   Imaging Studies: Dg Op Swallowing Func-medicare/speech Path  Result Date: 08/26/2017 CLINICAL DATA:  Dysphagia. EXAM: MODIFIED BARIUM SWALLOW TECHNIQUE: Different consistencies of barium were administered orally to the patient by the Speech Pathologist. Imaging of the pharynx was performed in the lateral projection. FLUOROSCOPY TIME:  Fluoroscopy Time:  1 minutes 42 seconds Number of Acquired Spot Images: Video strips obtained. COMPARISON:  Chest x-ray 07/01/2017. FINDINGS: Thin liquid-aspiration noted. Nectar thick liquid-prominent retention. Pure-prominent retention. Pure with cracker-prominent retention. IMPRESSION: 1.  Prominent retention with multiple substances. 2.  Aspiration of thin liquid noted. Please refer to the Speech Pathologists report for complete details and recommendations. Electronically Signed   By: Marcello Moores  Register   On: 08/26/2017 14:14   Ct Chest Lung Cancer Screening Low Dose Wo Contrast  Result Date: 08/30/2017 CLINICAL DATA:  67 year old male current smoker with 92 pack-year  history of smoking. Lung cancer screening examination. EXAM: CT CHEST WITHOUT CONTRAST LOW-DOSE FOR LUNG CANCER SCREENING TECHNIQUE: Multidetector CT imaging of the chest was performed following the standard protocol without IV contrast. COMPARISON:  Lung cancer screening chest CT 05/28/2015. Chest CT 04/12/2016. FINDINGS: Cardiovascular: [name] size is normal. There is no significant pericardial fluid, thickening or pericardial calcification. There is aortic atherosclerosis, as well as atherosclerosis of the great vessels of the mediastinum and the coronary arteries, including calcified atherosclerotic plaque in the left main and left anterior descending coronary arteries. Mediastinum/Nodes: No pathologically enlarged mediastinal or hilar lymph nodes. Please note that accurate exclusion of hilar adenopathy is limited on noncontrast CT scans. Esophagus is unremarkable in appearance. No axillary lymphadenopathy. Lungs/Pleura: Multiple new somewhat ill-defined nodular appearing areas are noted in the lungs bilaterally, the largest of which is in the medial aspect of the right middle lobe (axial image 186 of series 3), with a volume derived mean diameter of 16.1 mm, and some surrounding haziness related to ground-glass attenuation in the adjacent parenchyma. Other patchy regions of ground-glass attenuation are noted elsewhere throughout the mid to lower lungs bilaterally. What appear to be retained secretions are noted in the trachea. No pleural effusions. Mild diffuse bronchial wall thickening with mild centrilobular and paraseptal emphysema. Nodular areas of bilateral apical pleuroparenchymal thickening and architectural distortion are noted, most compatible with areas of chronic post infectious or inflammatory scarring. Upper Abdomen: Colonic diverticulosis  in the region of the splenic flexure. Musculoskeletal: There are no aggressive appearing lytic or blastic lesions noted in the visualized portions of the  skeleton. IMPRESSION: 1. Multiple new nodular appearing areas of what appear to be airspace consolidation are noted in the lungs bilaterally. Based on strict size criteria, the largest of these is categorized technically as Lung-RADS 4BS, suspicious. However, given the high likelihood of an infectious or inflammatory etiology (rather than malignancy), follow up low-dose chest CT without contrast in 3 months (please use the following order, "CT CHEST LCS NODULE FOLLOW-UP W/O CM") is recommended. 2. The "S" modifier above refers to potentially clinically significant non lung cancer related findings. Specifically, there is aortic atherosclerosis, in addition to left main and left anterior descending coronary artery disease. Please note that although the presence of coronary artery calcium documents the presence of coronary artery disease, the severity of this disease and any potential stenosis cannot be assessed on this non-gated CT examination. Assessment for potential risk factor modification, dietary therapy or pharmacologic therapy may be warranted, if clinically indicated. 3. Mild diffuse bronchial wall thickening with mild centrilobular and paraseptal emphysema; imaging findings suggestive of underlying COPD. These results will be called to the ordering clinician or representative by the Radiologist Assistant, and communication documented in the PACS or zVision Dashboard. Aortic Atherosclerosis (ICD10-I70.0) and Emphysema (ICD10-J43.9). Electronically Signed   By: Vinnie Langton M.D.   On: 08/30/2017 13:55    Assessment and Plan:   Luke [name] is a 67 y.o. y/o male for dysphagia and aspiration , incomplete work up in 2018 , did not have a follow up after EGD to check for resolution of symptoms.  Modified barium studies demonstrated transfer dysphagia, likely effects of prior radiation for prior cancer.  SLP notes suggest that he needs referral for swallowing therapy.  On endoscopy and noted poor  esophageal motility the last possible features of achalasia with an increased tone of his lower esophageal sphincter.He is at risk for aspiration and pneumonia    Plan  1.  Esophageal motility study to rule out achalasia 2.  Referral to speech therapy for education and swallowing therapy.   Dr Luke Bellows  MD,MRCP [facility]) Follow up in 6 weeks

## 2017-09-15 ENCOUNTER — Telehealth: Payer: Self-pay

## 2017-09-15 DIAGNOSIS — R1310 Dysphagia, unspecified: Secondary | ICD-10-CM

## 2017-09-15 DIAGNOSIS — Z1211 Encounter for screening for malignant neoplasm of colon: Secondary | ICD-10-CM

## 2017-09-15 NOTE — Telephone Encounter (Signed)
Manometry has been scheduled with Trish in Endo for 10/05/17.  Patient has been advised to stop omeprazole 7 days before procedure.  He has been provided phone number to call the day before for arrival time.  Patient education provided to patient during yesterdays office visit.  SLP was entered in epic by [place] on 09/13/17.  Patient has been advised that he will be contacted with an appt.  Thanks [employer]

## 2017-09-19 ENCOUNTER — Encounter: Payer: BLUE CROSS/BLUE SHIELD | Admitting: Family Medicine

## 2017-10-05 ENCOUNTER — Ambulatory Visit
Admission: RE | Admit: 2017-10-05 | Discharge: 2017-10-05 | Disposition: A | Discharge status: Home / Self Care | Payer: BLUE CROSS/BLUE SHIELD | Source: Ambulatory Visit | Attending: Gastroenterology | Admitting: Gastroenterology

## 2017-10-05 ENCOUNTER — Encounter
Admission: RE | Disposition: A | Discharge status: Home / Self Care | Payer: Self-pay | Source: Ambulatory Visit | Attending: Gastroenterology

## 2017-10-05 ENCOUNTER — Encounter: Payer: Self-pay | Admitting: *Deleted

## 2017-10-05 DIAGNOSIS — Z79899 Other long term (current) drug therapy: Secondary | ICD-10-CM | POA: Diagnosis not present

## 2017-10-05 DIAGNOSIS — R05 Cough: Secondary | ICD-10-CM | POA: Insufficient documentation

## 2017-10-05 DIAGNOSIS — R292 Abnormal reflex: Secondary | ICD-10-CM | POA: Diagnosis not present

## 2017-10-05 DIAGNOSIS — Z791 Long term (current) use of non-steroidal anti-inflammatories (NSAID): Secondary | ICD-10-CM | POA: Insufficient documentation

## 2017-10-05 HISTORY — PX: ESOPHAGEAL MANOMETRY: SHX5429

## 2017-10-05 SURGERY — MANOMETRY, ESOPHAGUS

## 2017-10-05 MED ORDER — BUTAMBEN-TETRACAINE-BENZOCAINE 2-2-14 % EX AERO
INHALATION_SPRAY | CUTANEOUS | Status: AC
  Filled 2017-10-05: qty 5

## 2017-10-05 MED ORDER — BUTAMBEN-TETRACAINE-BENZOCAINE 2-2-14 % EX AERO
INHALATION_SPRAY | CUTANEOUS | Status: DC | PRN
  Administered 2017-10-05: 4 via TOPICAL

## 2017-10-05 MED ORDER — LIDOCAINE HCL URETHRAL/MUCOSAL 2 % EX GEL
CUTANEOUS | Status: AC
  Filled 2017-10-05: qty 5

## 2017-10-05 MED ORDER — LIDOCAINE HCL URETHRAL/MUCOSAL 2 % EX GEL
CUTANEOUS | Status: DC | PRN
  Administered 2017-10-05: 2

## 2017-10-05 SURGICAL SUPPLY — 2 items
FACESHIELD LNG OPTICON STERILE (SAFETY) IMPLANT
GLOVE BIO SURGEON STRL SZ8 (GLOVE) ×4 IMPLANT

## 2017-10-06 ENCOUNTER — Encounter: Payer: Self-pay | Admitting: Gastroenterology

## 2017-10-07 ENCOUNTER — Telehealth: Payer: Self-pay | Admitting: Gastroenterology

## 2017-10-07 NOTE — Telephone Encounter (Signed)
Pt sister left vm pt was supposed to go to speech therapist but has not heard anything please call her at cb [phone]

## 2017-10-17 ENCOUNTER — Encounter: Payer: Self-pay | Admitting: Speech Pathology

## 2017-10-17 ENCOUNTER — Ambulatory Visit: Payer: BLUE CROSS/BLUE SHIELD | Attending: Gastroenterology | Admitting: Speech Pathology

## 2017-10-17 ENCOUNTER — Other Ambulatory Visit: Payer: Self-pay

## 2017-10-17 DIAGNOSIS — R1312 Dysphagia, oropharyngeal phase: Secondary | ICD-10-CM

## 2017-10-17 NOTE — Therapy (Signed)
[employer] MAIN [facility] SERVICES [address] [place], [place], [address] Phone: [phone]   Fax:  [phone]  Speech Language Pathology Evaluation  Patient Details  Name: Luke [name] MRN: [id number] Date of Birth: [birth year] Referring Provider (SLP): Jonathon Bellows   Encounter Date: 10/17/2017  End of Session - 10/17/17 1549    Visit Number  1    Number of Visits  1    Date for SLP Re-Evaluation  10/17/17    SLP Start Time  0900    SLP Stop Time   0945    SLP Time Calculation (min)  45 min    Activity Tolerance  Patient tolerated treatment well       Past Medical History:  Diagnosis Date  . Anxiety   . Arthritis   . Carcinoma metastatic to lymph node ([place])   . Cataract    right  . COPD (chronic obstructive pulmonary disease) ([place])   . Depression   . GERD (gastroesophageal reflux disease)   . Hoarseness of voice   . Personal history of tobacco use, presenting hazards to health February of 2001  . Thyroid disease     Past Surgical History:  Procedure Laterality Date  . COLONOSCOPY    . COLONOSCOPY WITH PROPOFOL N/A 11/29/2016   Procedure: COLONOSCOPY WITH PROPOFOL;  Surgeon: Manya Silvas, MD;  Location: [facility] ENDOSCOPY;  Service: Endoscopy;  Laterality: N/A;  . COLONOSCOPY WITH PROPOFOL N/A 08/22/2017   Procedure: COLONOSCOPY WITH PROPOFOL;  Surgeon: Jonathon Bellows, MD;  Location: [facility] ENDOSCOPY;  Service: Gastroenterology;  Laterality: N/A;  . ESOPHAGEAL MANOMETRY N/A 10/05/2017   Procedure: ESOPHAGEAL MANOMETRY (EM);  Surgeon: Jonathon Bellows, MD;  Location: [facility] ENDOSCOPY;  Service: Gastroenterology;  Laterality: N/A;  . ESOPHAGOGASTRODUODENOSCOPY (EGD) WITH PROPOFOL N/A 11/29/2016   Procedure: ESOPHAGOGASTRODUODENOSCOPY (EGD) WITH PROPOFOL;  Surgeon: Manya Silvas, MD;  Location: [facility] ENDOSCOPY;  Service: Endoscopy;  Laterality: N/A;  . ESOPHAGOGASTRODUODENOSCOPY (EGD) WITH PROPOFOL N/A 08/22/2017   Procedure:  ESOPHAGOGASTRODUODENOSCOPY (EGD) WITH PROPOFOL;  Surgeon: Jonathon Bellows, MD;  Location: [facility] ENDOSCOPY;  Service: Gastroenterology;  Laterality: N/A;  . NECK SURGERY     status post diagnosis of cancer with chemo and radiation  . TONSILLECTOMY      There were no vitals filed for this visit.      SLP Evaluation [facility] - 10/17/17 0001      SLP Visit Information   SLP Received On  10/17/17    Referring Provider (SLP)  Jonathon Bellows    Onset Date  09/13/2017    Medical Diagnosis  Oropharyngeal dysphagia      Subjective   Subjective  "Why didn't anyone tell me about this?"    Patient/Family Stated Goal  Home exercise program to reduce choking.        General Information   HPI  MBS 08/26/2017: This 67 year old man; with history of lymph node cancer and radiation treatments to the neck (18 years ago); is presenting with moderate-severe pharyngeal dysphagia characterized by delayed pharyngeal swallow initiation, reduced hyolaryngeal excursion, absent epiglottic inversion, moderate-severe pharyngeal residue, laryngeal penetration (with laryngeal vestibule residue), and silent aspiration.  A voluntary cough is effective in clearing the trachea and laryngeal vestibule of barium.  These findings are consistent with late effects of radiation on oropharyngeal swallowing musculature.  The patient is at significant risk for pulmonary compromise due to chronic aspiration.  The patient would benefit from swallowing exercise program to improve hyolaryngeal movement / reduce pharyngeal residue  and education regarding potential for continued decline in swallow safety secondary late effects of radiation.      Prior Functional Status   Cognitive/Linguistic Baseline  Within functional limits      Oral Motor/Sensory Function   Overall Oral Motor/Sensory Function  Appears within functional limits for tasks assessed      Motor Speech   Overall Motor Speech  Impaired    Respiration  Within functional limits     Phonation  Hoarse;Low vocal intensity    Resonance  Within functional limits    Articulation  Within functional limitis    Intelligibility  Intelligible    Phonation  Impaired    Vocal Abuses  Habitual Hyperphonia;Habitual Cough/Throat Clear;Smoking;Vocal Fold Dehydration    Tension Present  Jaw;Neck;Shoulder    Volume  Appropriate    Pitch  Appropriate                      SLP Education - 10/17/17 1549    Education Details  Results MBS/late effects of radiation/HEP    Person(s) Educated  Patient    Methods  Explanation;Demonstration;Handout    Comprehension  Verbalized understanding;Returned demonstration           Plan - 10/17/17 1550    Clinical Impression Statement  Reviewed the results and recommendations regarding the MBS done 08/26/2017.  Reviewed aspiration precautions.  Patient educated regarding the late effects of radiation on pharyngeal swallowing muscles and potential dangers of aspiration.  The patient was provided with written and recorded instructions for swallowing exercises (Shaker Exercise, [employer], Masako Swallow, and High Effort Pitch Glide).  The patient is independent in executing exercises and understands recommended frequency.  The patient feels confident that he can carry out the exercise routine and does not want to come back for follow up.  He has my card if he has any questions or concerns regarding swallowing or his HEP.    Speech Therapy Frequency  One time visit    Treatment/Interventions  Aspiration precaution training;Pharyngeal strengthening exercises;SLP instruction and feedback;Patient/family education    SLP Home Exercise Plan  Provided    Consulted and Agree with Plan of Care  Patient       Patient will benefit from skilled therapeutic intervention in order to improve the following deficits and impairments:   Dysphagia, oropharyngeal phase - Plan: SLP plan of care cert/re-cert    Problem List Patient Active Problem  List   Diagnosis Date Noted  . Myalgia due to statin 09/01/2017  . Elevated hemoglobin A1c 08/29/2017  . COPD (chronic obstructive pulmonary disease) ([place]) 07/08/2017  . Dysphagia, pharyngeal phase 07/08/2017  . Personal history of tobacco use, presenting hazards to health 09/23/2014  . Pure hypercholesterolemia 09/06/2014  . Tobacco abuse 09/06/2014  . Benign essential hypertension 06/15/2013  . History of lymphoma 06/15/2013  . Esophageal reflux 06/08/2013  . Hypothyroidism 06/08/2013   Leroy Sea, MS/CCC- SLP  Lou Miner 10/17/2017, 3:53 PM  [place] MAIN [facility] SERVICES [address] [place], [place], [address] Phone: [phone]   Fax:  [phone]  Name: Luke [name] MRN: [id number] Date of Birth: 1950-08-22

## 2017-10-21 ENCOUNTER — Ambulatory Visit: Payer: BLUE CROSS/BLUE SHIELD | Admitting: Speech Pathology

## 2017-10-24 ENCOUNTER — Ambulatory Visit: Payer: BLUE CROSS/BLUE SHIELD | Admitting: Speech Pathology

## 2017-10-28 ENCOUNTER — Ambulatory Visit: Payer: BLUE CROSS/BLUE SHIELD | Admitting: Speech Pathology

## 2017-10-31 ENCOUNTER — Ambulatory Visit: Payer: BLUE CROSS/BLUE SHIELD | Admitting: Speech Pathology

## 2017-10-31 ENCOUNTER — Encounter: Payer: Self-pay | Admitting: Gastroenterology

## 2017-11-02 ENCOUNTER — Other Ambulatory Visit: Payer: Self-pay

## 2017-11-02 ENCOUNTER — Ambulatory Visit: Payer: BLUE CROSS/BLUE SHIELD | Admitting: Gastroenterology

## 2017-11-02 ENCOUNTER — Encounter: Payer: Self-pay | Admitting: Gastroenterology

## 2017-11-02 VITALS — BP 102/70 | HR 69 | Ht 73.0 in | Wt 198.4 lb

## 2017-11-02 DIAGNOSIS — R1310 Dysphagia, unspecified: Secondary | ICD-10-CM

## 2017-11-02 DIAGNOSIS — R1319 Other dysphagia: Secondary | ICD-10-CM

## 2017-11-02 DIAGNOSIS — R1312 Dysphagia, oropharyngeal phase: Secondary | ICD-10-CM | POA: Diagnosis not present

## 2017-11-02 NOTE — Progress Notes (Signed)
Jonathon Bellows MD, MRCP(U.K) [address]  [place]  [place], [place] [address]  Main: [phone]  Fax: [phone]   Primary Care Physician: Olin Hauser, DO  Primary Gastroenterologist:  Dr. Jonathon Bellows   Chief Complaint  Patient presents with  . Follow-up    Dysphagia    HPI: Luke [name] is a 67 y.o. male    Summary of history :  He is here today to follow-up  for dysphagia and aspiration.  He says that the dysphagia has been ongoing for a few years and getting worse.  Occurs every day whenever he eats has coughing and feels the food goes down the wrong way.  Mostly affects solids and steak does not feel that it gets stuck.  He states that he drinks and it does not go down.  Occasionally food consumed a long time back also comes out.  He has a history of reflux.  He had been on Prilosec every day morning.  He has had radiation to his neck 18 years back and affected his salivary glands and since then produces minimal saliva.  He had been seen by DR Tiffany Kocher in 08/2016 for constipation , dysphagia for solids over a year , chest discomfort. He had an EGD and a schtzkis ring was stretched to 16 mm , no subsequent follow up , no biopsies taken to rule out EOE. A colonoscopy was done at the same time and a tubular adenoma was excised. Poor prep and repeat colonoscopy suggested. 08/22/2017: Upper endoscopy:Abnormal motility was noted in the lower third of the esophagus. There was a decrease inmotility of  the esophageal body. The distal esophagus/lower esophageal sphincter was spastic, but gives up passage to the endoscope.Normal mucosa was found in the entire esophagus. Biopsies were obtained and showed features of reflux esophagitis.  08/22/2017: Colonoscopy: 2 tiny polyps were resected.  1 of which was a tubular adenoma and the other one was a hyperplastic polyp.  08/26/2017 modified barium swallow showed prominent retention with multiple substances aspiration of  thin liquid noted.  Speech pathology mentioned tmoderate laryngeal excursion, absent epiglottic inversion, reduced amplitude duration of UES opening, moderate to severe pharyngeal residue.  Silent penetration to the vocal cords with laryngeal vestibule residue, silent aspiration.  Has delayed effects of radiation on oropharyngeal following musculature.  Patient with a significant risk for pulmonary compromise due to chronic sedation.  He would benefit from swallowing exercise program to improve hypo-laryngeal motility reduce pharyngeal residue and education regarding potential benefits for continued decline in swallow  secondary late effects of radiation  Interval history 09/14/2017-11/02/17    10/25/17 : Esophageal manometry showed IRP 26 and did not clearly meet criteria for achalasia.       Current Outpatient Medications  Medication Sig Dispense Refill  . albuterol (PROVENTIL HFA;VENTOLIN HFA) 108 (90 Base) MCG/ACT inhaler Inhale 2 puffs every 6 (six) hours as needed into the lungs for wheezing or shortness of breath.    . levothyroxine (SYNTHROID, LEVOTHROID) 88 MCG tablet TAKE 1 TABLET BY MOUTH ONCE A DAY ON AN EMPTY STOMACH.    . meloxicam (MOBIC) 15 MG tablet Take 1 tablet by mouth daily  5  . Multiple Vitamins-Minerals (MULTIVITAMIN PO) Take by mouth.    Marland Kitchen omeprazole (PRILOSEC) 20 MG capsule TAKE ONE CAPSULE BY MOUTH EVERY DAY FOR HEART BURN    . albuterol (PROVENTIL) (2.5 MG/3ML) 0.083% nebulizer solution Take 3 mLs (2.5 mg total) by nebulization every 2 (two) hours as needed for  wheezing. Dx: copd exacerbation J44.1 (Patient not taking: Reported on 09/14/2017) 100 vial 0   No current facility-administered medications for this visit.     Allergies as of 11/02/2017  . (No Known Allergies)    ROS:  General: Negative for anorexia, weight loss, fever, chills, fatigue, weakness. ENT: Negative for hoarseness, difficulty swallowing , nasal congestion. CV: Negative for chest pain,  angina, palpitations, dyspnea on exertion, peripheral edema.  Respiratory: Negative for dyspnea at rest, dyspnea on exertion, cough, sputum, wheezing.  GI: See history of present illness. GU:  Negative for dysuria, hematuria, urinary incontinence, urinary frequency, nocturnal urination.  Endo: Negative for unusual weight change.    Physical Examination:   BP 102/70   Pulse 69   Ht 6\' 1"  (1.854 m)   Wt 198 lb 6.4 oz (90 kg)   BMI 26.18 kg/m   General: Well-nourished, well-developed in no acute distress.  Eyes: No icterus. Conjunctivae pink. Mouth: Oropharyngeal mucosa moist and pink , no lesions erythema or exudate. Lungs: Clear to auscultation bilaterally. Non-labored. Heart: Regular rate and rhythm, no murmurs rubs or gallops.  Abdomen: Bowel sounds are normal, nontender, nondistended, no hepatosplenomegaly or masses, no abdominal bruits or hernia , no rebound or guarding.   Extremities: No lower extremity edema. No clubbing or deformities. Neuro: Alert and oriented x 3.  Grossly intact. Skin: Warm and dry, no jaundice.   Psych: Alert and cooperative, normal mood and affect.   Imaging Studies: No results found.  Assessment and Plan:   Luke [name] is a 67 y.o. y/o male here to follow up  [place]ynn and aspiration , incomplete work up in 2018 , Modified barium studies demonstrated transfer dysphagia, likely effects of prior radiation for prior cancer. Undergoing treatment with SLP and has helped. EGD showed a spastic LES and manometry shows findings which could indicate early achalasia but does not clearly fit the criteria.    Plan  1.  Barium swallow with tablet , iif shows features of achalaisa then will refer to Dr Silverio Decamp at [facility] for dilation vs discussion regarding POEM surgery.   Dr Jonathon Bellows  MD,MRCP [facility]) Follow up in 6 months

## 2017-11-07 ENCOUNTER — Ambulatory Visit: Payer: BLUE CROSS/BLUE SHIELD | Admitting: Speech Pathology

## 2017-11-08 ENCOUNTER — Ambulatory Visit
Admission: RE | Admit: 2017-11-08 | Discharge: 2017-11-08 | Disposition: A | Discharge status: Home / Self Care | Payer: BLUE CROSS/BLUE SHIELD | Source: Ambulatory Visit | Attending: Gastroenterology | Admitting: Gastroenterology

## 2017-11-08 DIAGNOSIS — R1312 Dysphagia, oropharyngeal phase: Secondary | ICD-10-CM | POA: Insufficient documentation

## 2017-11-08 DIAGNOSIS — R1310 Dysphagia, unspecified: Secondary | ICD-10-CM

## 2017-11-08 DIAGNOSIS — T17320A Food in larynx causing asphyxiation, initial encounter: Secondary | ICD-10-CM | POA: Diagnosis not present

## 2017-11-08 DIAGNOSIS — R1319 Other dysphagia: Secondary | ICD-10-CM

## 2017-11-11 ENCOUNTER — Encounter: Payer: BLUE CROSS/BLUE SHIELD | Admitting: Speech Pathology

## 2017-11-16 ENCOUNTER — Telehealth: Payer: Self-pay

## 2017-11-16 ENCOUNTER — Other Ambulatory Visit: Payer: Self-pay

## 2017-11-16 DIAGNOSIS — R1319 Other dysphagia: Secondary | ICD-10-CM

## 2017-11-16 DIAGNOSIS — R1310 Dysphagia, unspecified: Secondary | ICD-10-CM

## 2017-11-16 NOTE — Telephone Encounter (Signed)
Spoke with pt and informed him of Barium swallow results and Dr. Georgeann Oppenheim instructions to refer pt to Dr. Silverio Decamp at [facility] for further evaluation. Pt understands and agrees.

## 2017-11-16 NOTE — Telephone Encounter (Signed)
-----   Message from Mauri Pole, MD sent at 11/14/2017  8:43 AM EST ----- Eustaquio Maize, can you please get him in for a visit soon. Thanks Kiran, do you use [facility] system for manometry? We dont have the software here, not sure if I can look at it.  Thanks VN ----- Message ----- From: Jonathon Bellows, MD Sent: 11/13/2017  12:18 PM EST To: Olin Hauser, DO, #  Sherald Hess inform that the results are abnormal and shows he is aspirating - I would like to refer to Dr Silverio Decamp at [facility] for her input. Can we have the actual manometry study sent on a Disc to Dr Silverio Decamp before his appointment.   C/c Olin Hauser, DO , Dr Silverio Decamp   Dr Jonathon Bellows MD,MRCP [facility]) Gastroenterology/Hepatology Pager: [phone]

## 2017-11-19 ENCOUNTER — Telehealth: Payer: Self-pay

## 2017-11-19 NOTE — Telephone Encounter (Signed)
Call pt regarding lung screening. Pt has no vmx unable to leave message.

## 2017-11-26 ENCOUNTER — Telehealth: Payer: Self-pay

## 2017-11-26 NOTE — Telephone Encounter (Signed)
Call pt regarding lung screening. Per pt he had scan 08-30-17. We need to get our records straight he doesn't think he need one this early. PT is a current smoker, smoking about 1/2 pack per day. If pt has to have another scan he want the 1st appt. Of the day.

## 2017-11-28 ENCOUNTER — Telehealth: Payer: Self-pay | Admitting: *Deleted

## 2017-11-28 DIAGNOSIS — Z87891 Personal history of nicotine dependence: Secondary | ICD-10-CM

## 2017-11-28 DIAGNOSIS — Z122 Encounter for screening for malignant neoplasm of respiratory organs: Secondary | ICD-10-CM

## 2017-11-28 DIAGNOSIS — R918 Other nonspecific abnormal finding of lung field: Secondary | ICD-10-CM

## 2017-11-28 NOTE — Telephone Encounter (Signed)
Contacted patient regarding follow up scan of his recent lung screening scan. Patient is agreeable to scheduling scan.

## 2017-11-30 ENCOUNTER — Ambulatory Visit
Admission: RE | Admit: 2017-11-30 | Discharge: 2017-11-30 | Disposition: A | Discharge status: Home / Self Care | Payer: BLUE CROSS/BLUE SHIELD | Source: Ambulatory Visit | Attending: Oncology | Admitting: Oncology

## 2017-11-30 DIAGNOSIS — I77810 Thoracic aortic ectasia: Secondary | ICD-10-CM | POA: Insufficient documentation

## 2017-11-30 DIAGNOSIS — Z122 Encounter for screening for malignant neoplasm of respiratory organs: Secondary | ICD-10-CM | POA: Insufficient documentation

## 2017-11-30 DIAGNOSIS — K8020 Calculus of gallbladder without cholecystitis without obstruction: Secondary | ICD-10-CM | POA: Insufficient documentation

## 2017-11-30 DIAGNOSIS — I700 Atherosclerosis of aorta: Secondary | ICD-10-CM | POA: Insufficient documentation

## 2017-11-30 DIAGNOSIS — Z87891 Personal history of nicotine dependence: Secondary | ICD-10-CM

## 2017-11-30 DIAGNOSIS — R918 Other nonspecific abnormal finding of lung field: Secondary | ICD-10-CM

## 2017-11-30 DIAGNOSIS — J439 Emphysema, unspecified: Secondary | ICD-10-CM | POA: Insufficient documentation

## 2017-11-30 DIAGNOSIS — F17210 Nicotine dependence, cigarettes, uncomplicated: Secondary | ICD-10-CM | POA: Diagnosis not present

## 2017-11-30 DIAGNOSIS — I2510 Atherosclerotic heart disease of native coronary artery without angina pectoris: Secondary | ICD-10-CM | POA: Diagnosis not present

## 2017-12-01 DIAGNOSIS — R0602 Shortness of breath: Secondary | ICD-10-CM | POA: Diagnosis not present

## 2017-12-01 DIAGNOSIS — J209 Acute bronchitis, unspecified: Secondary | ICD-10-CM | POA: Diagnosis not present

## 2017-12-01 DIAGNOSIS — J449 Chronic obstructive pulmonary disease, unspecified: Secondary | ICD-10-CM | POA: Diagnosis not present

## 2017-12-01 DIAGNOSIS — R05 Cough: Secondary | ICD-10-CM | POA: Diagnosis not present

## 2017-12-06 ENCOUNTER — Encounter: Payer: Self-pay | Admitting: *Deleted

## 2017-12-06 ENCOUNTER — Ambulatory Visit: Payer: BLUE CROSS/BLUE SHIELD | Admitting: Family Medicine

## 2017-12-06 ENCOUNTER — Encounter: Payer: Self-pay | Admitting: Family Medicine

## 2017-12-06 VITALS — BP 124/78 | HR 70 | Temp 98.0°F | Resp 16 | Ht 73.0 in | Wt 193.0 lb

## 2017-12-06 DIAGNOSIS — F419 Anxiety disorder, unspecified: Secondary | ICD-10-CM

## 2017-12-06 DIAGNOSIS — K219 Gastro-esophageal reflux disease without esophagitis: Secondary | ICD-10-CM | POA: Diagnosis not present

## 2017-12-06 DIAGNOSIS — J441 Chronic obstructive pulmonary disease with (acute) exacerbation: Secondary | ICD-10-CM | POA: Diagnosis not present

## 2017-12-06 DIAGNOSIS — J432 Centrilobular emphysema: Secondary | ICD-10-CM

## 2017-12-06 DIAGNOSIS — R7309 Other abnormal glucose: Secondary | ICD-10-CM | POA: Diagnosis not present

## 2017-12-06 DIAGNOSIS — R1313 Dysphagia, pharyngeal phase: Secondary | ICD-10-CM

## 2017-12-06 LAB — POCT GLYCOSYLATED HEMOGLOBIN (HGB A1C): HEMOGLOBIN A1C: 6.1 % — AB (ref 4.0–5.6)

## 2017-12-06 MED ORDER — ALPRAZOLAM 0.25 MG PO TABS: 0.2500 mg | ORAL_TABLET | Freq: Every day | ORAL | 0 refills | Status: DC | PRN

## 2017-12-06 MED ORDER — ALBUTEROL SULFATE HFA 108 (90 BASE) MCG/ACT IN AERS: 2.0000 | INHALATION_SPRAY | RESPIRATORY_TRACT | 3 refills | Status: DC | PRN

## 2017-12-06 MED ORDER — PREDNISONE 50 MG PO TABS: 50.0000 mg | ORAL_TABLET | Freq: Every day | ORAL | 0 refills | Status: DC

## 2017-12-06 MED ORDER — OMEPRAZOLE 20 MG PO CPDR: 20.0000 mg | DELAYED_RELEASE_CAPSULE | Freq: Every day | ORAL | 1 refills | Status: DC

## 2017-12-06 NOTE — Assessment & Plan Note (Signed)
Today with acute exacerbation of COPD, see A&P Known emphysema COPD Followed by Rafael Bihari - next apt 12/26 for PFTs Recently updated Low dose CT Lung scan - see results - showed bi rads 2, stable appearance nodule after 3 month, also no infiltrate or pneumonia seen

## 2017-12-06 NOTE — Assessment & Plan Note (Signed)
Persistent problem - likely triggering COPD flare up vs aspiration concerns Recent barium swallow per AGI, see results Now recently referred to [facility] to other provider for consider procedural intervention

## 2017-12-06 NOTE — Progress Notes (Signed)
Subjective:    Patient ID: Luke [name], male    DOB: 1950-03-22, 67 y.o.   MRN: [id number]  Luke [name] is a 67 y.o. male presenting on 12/06/2017 for Hypertension and COPD   HPI  Follow-up PreDM No concerns. Now A1c 6.1, last time 6.2. He has tried to limit sugars. Recently on prednisone. CBGs: Not checking Meds: None Currently not ACEi / ARB Lifestyle: - Diet (Admits high sugar carb diet, sweets, candy bars, bread etc)  - Exercise (Stays active, steps) Denies hypoglycemia  CHRONIC HTN: Reportshe checks BP at home, stable readings since prior visit. Current Meds -NONE Diet - drinks plenty of water Denies CP, dyspnea, HA, edema, dizziness / lightheadedness  Anxiety Prior history Xanax 0.25mg , request refill today has been off for a while but took for many years before. No history of withdrawal. Helps him calm down his breathing and has a "woozy" feeling in abdomen as well.  Follow-up Esophagyeal Dsyphagia / History of Aspiration Had recent barium swallow and now pending referral to [facility] Dr Silverio Decamp  Acute COPD Exacerbation / History of Aspiration History of aspiration pneumonia in past hospitalized. Recently had CT Lung Scan 6 days ago did not show any sign of infiltrate or pneumonia - Reports symptoms onset with 1-2 weeks ago shortness of breath, cough, wheezing. Felt difficulty breathing similar to prior COPD issues. He was seen at a local [facility] and given Levaquin 750mg  daily for 10 days and Prednisone taper. He improved on earlier high dose prednisone, now has 2 days left of levaquin. Still problem seems breathing worsening. - Has albuterol neb but not inhaler - Next apt with Pulm is not for 1 month has PFTs - Admits dyspnea, productive cough Denies fever, chills  Health Maintenance: Due for Flu shot, he thinks he got one already this year. But may return within few weeks when feeling better for flu shot  Depression screen [facility] 2/9 09/01/2017  07/08/2017 07/01/2017  Decreased Interest 0 0 0  Down, Depressed, Hopeless 0 0 0  PHQ - 2 Score 0 0 0    Social History   Tobacco Use  . Smoking status: Current Some Day Smoker    Packs/day: 0.25    Years: 40.00    Pack years: 10.00    Types: Cigarettes  . Smokeless tobacco: Current User  Substance Use Topics  . Alcohol use: No  . Drug use: No    Review of Systems Per HPI unless specifically indicated above     Objective:    BP 124/78   Pulse 70   Temp 98 F (36.7 C) (Oral)   Resp 16   Ht 6\' 1"  (1.854 m)   Wt 193 lb (87.5 kg)   SpO2 96%   BMI 25.46 kg/m   Wt Readings from Last 3 Encounters:  12/06/17 193 lb (87.5 kg)  11/02/17 198 lb 6.4 oz (90 kg)  09/14/17 195 lb 3.2 oz (88.5 kg)    Physical Exam  Constitutional: He is oriented to person, place, and time. He appears well-developed and well-nourished. No distress.  Mostly well appearing, slightly tired comfortable, cooperative  HENT:  Head: Normocephalic and atraumatic.  Mouth/Throat: Oropharynx is clear and moist.  Eyes: Conjunctivae are normal. Right eye exhibits no discharge. Left eye exhibits no discharge.  Neck: Normal range of motion. Neck supple.  Cardiovascular: Normal rate, regular rhythm, normal heart sounds and intact distal pulses.  No murmur heard. Pulmonary/Chest: Effort normal. No respiratory distress. He has  wheezes. He has no rales.  Diffuse wheezing coarse sounds bilateral, no focal abnormality, reduced air movement. Similar to baseline air movement. Speaks full sentences. Occasional prolonged expiratory cough  Musculoskeletal: He exhibits no edema.  Neurological: He is alert and oriented to person, place, and time.  Skin: Skin is warm and dry. No rash noted. He is not diaphoretic. No erythema.  Psychiatric: He has a normal mood and affect. His behavior is normal.  Well groomed, good eye contact, normal speech and thoughts  Nursing note and vitals reviewed.  Results for orders placed or  performed in visit on 12/06/17  POCT HgB A1C  Result Value Ref Range   Hemoglobin A1C 6.1 (A) 4.0 - 5.6 %   Recent Labs    08/26/17 0811 12/06/17 0824  HGBA1C 6.2* 6.1*    I have personally reviewed the radiology report from 11/30/17 CT Chest Low Dose Nodule F/u  CLINICAL DATA:  67 year old asymptomatic male current smoker with 92.25 pack-year smoking history. Patient presents for three-month follow-up. Remote history of lymphoma.  EXAM: CT CHEST WITHOUT CONTRAST FOR LUNG CANCER SCREENING NODULE FOLLOW-UP  TECHNIQUE: Multidetector CT imaging of the chest was performed following the standard protocol without IV contrast.  COMPARISON:  08/30/2017 screening chest CT.  FINDINGS: Cardiovascular: Normal heart size. No significant pericardial effusion/thickening. Three-vessel coronary atherosclerosis. Atherosclerotic thoracic aorta with stable ectatic 4.3 cm ascending thoracic aorta. Normal caliber pulmonary arteries.  Mediastinum/Nodes: No discrete thyroid nodules. Unremarkable esophagus. No pathologically enlarged axillary, mediastinal or hilar lymph nodes, noting limited sensitivity for the detection of hilar adenopathy on this noncontrast study.  Lungs/Pleura: No pneumothorax. No pleural effusion. Mild centrilobular emphysema with mild diffuse bronchial wall thickening. No acute consolidative airspace disease or lung masses. Previously described patchy nodular foci of consolidation in both lungs have all resolved, compatible with resolved infectious/inflammatory foci. New patchy nodular ground-glass opacities in the lingula, largest 12.6 mm in volume derived mean diameter (series 3/image 208).  Upper abdomen: Cholelithiasis.  Musculoskeletal: No aggressive appearing focal osseous lesions. Moderate upper thoracic spondylosis.  IMPRESSION: 1. Lung-RADS 2, benign appearance or behavior. Continue annual screening with low-dose chest CT without contrast in 12  months. 2. Three-vessel coronary atherosclerosis. 3. Stable ectatic 4.3 cm ascending thoracic aorta, which can be reassessed on follow-up screening chest CT in 12 months. 4. Cholelithiasis.  Aortic Atherosclerosis (ICD10-I70.0) and Emphysema (ICD10-J43.9).   Electronically Signed   By: Ilona Sorrel M.D.   On: 12/02/2017 12:32    Assessment & Plan:   Problem List Items Addressed This Visit    Anxiety    Recent worsening anxiety and issues related to his difficulty breathing and constellation of symptoms Used to take Xanax 0.25mg  for years, has not had withdrawal or complications from BDZ  Request refill today to continue, especially helpful PRN for breathing with COPD      Relevant Medications   ALPRAZolam (XANAX) 0.25 MG tablet   COPD (chronic obstructive pulmonary disease) (HCC)    Today with acute exacerbation of COPD, see A&P Known emphysema COPD Followed by Rafael Bihari - next apt 12/26 for PFTs Recently updated Low dose CT Lung scan - see results - showed bi rads 2, stable appearance nodule after 3 month, also no infiltrate or pneumonia seen      Relevant Medications   albuterol (PROVENTIL HFA;VENTOLIN HFA) 108 (90 Base) MCG/ACT inhaler   predniSONE (DELTASONE) 50 MG tablet   Elevated hemoglobin A1c    Stable mildly elevated A1c at 6.1, from prior 6.2 -  recent prednisone may increase Improving diet  Plan:  1. Not on any therapy currently  2. Encourage improved lifestyle - low carb, low sugar diet, reduce portion size, continue improving activity future regular exercise 3. Follow-up 4 months      Relevant Orders   POCT HgB A1C (Completed)   Esophageal reflux    Recent flare of reflux Known complication with dysphagia and history of aspiration Re order Omeprazole      Relevant Medications   omeprazole (PRILOSEC) 20 MG capsule    Other Visit Diagnoses    COPD with acute exacerbation ([place])    -  Primary   Relevant Medications   albuterol (PROVENTIL  HFA;VENTOLIN HFA) 108 (90 Base) MCG/ACT inhaler   predniSONE (DELTASONE) 50 MG tablet      Consistent with persistent mild moderate acute exacerbation of COPD with worsening productive cough dyspnea and wheezing. Similar to prior exacerbations, last 2 times in past 3-4 months. - No hypoxia (96% on RA), afebrile, no recent hospitalization - Continues Albuterol PRN  Plan: - recently had CT chest 6 days ago - discussion to hold on repeat imaging, also he is afebrile - finish levaquin 750 for 2 more days as prescribed by urgent care - EXTEND Prednisone - now - start Prednisone 50mg  x 5 day steroid burst - Use albuterol q 4 hr regularly x 2-3 days. Refill RTC about 1 week if not improving, otherwise strict return criteria to go to ED   Meds ordered this encounter  Medications  . albuterol (PROVENTIL HFA;VENTOLIN HFA) 108 (90 Base) MCG/ACT inhaler    Sig: Inhale 2 puffs into the lungs every 4 (four) hours as needed for wheezing or shortness of breath.    Dispense:  1 Inhaler    Refill:  3  . predniSONE (DELTASONE) 50 MG tablet    Sig: Take 1 tablet (50 mg total) by mouth daily with breakfast.    Dispense:  5 tablet    Refill:  0  . omeprazole (PRILOSEC) 20 MG capsule    Sig: Take 1 capsule (20 mg total) by mouth daily before breakfast.    Dispense:  90 capsule    Refill:  1  . ALPRAZolam (XANAX) 0.25 MG tablet    Sig: Take 1 tablet (0.25 mg total) by mouth daily as needed for anxiety.    Dispense:  30 tablet    Refill:  0      Follow up plan: Return in about 4 months (around 04/06/2018) for PreDM A1c, COPD, Dysphagia - updates.   Nobie Putnam, DO [facility] 12/06/2017, 8:43 AM

## 2017-12-06 NOTE — Patient Instructions (Addendum)
Thank you for coming to the office today.  A1c 6.1, improved. Keep limiting sugar  For breathing, likely COPD flare - Finish antibiotics - Added more Prednisone 50mg  daily for 5 days - use breathing treatments, refilled inhaler  Take stomach acid medicine  Agreed to rx Xanax low dose once daily as needed for anxiety for now - while treating other issues including breathing.  Please schedule a Follow-up Appointment to: Return in about 4 months (around 04/06/2018) for PreDM A1c, COPD, Dysphagia - updates.  If you have any other questions or concerns, please feel free to call the office or send a message through [facility]. You may also schedule an earlier appointment if necessary.  Additionally, you may be receiving a survey about your experience at our office within a few days to 1 week by e-mail or mail. We value your feedback.  Nobie Putnam, DO [place]

## 2017-12-06 NOTE — Assessment & Plan Note (Signed)
Recent worsening anxiety and issues related to his difficulty breathing and constellation of symptoms Used to take Xanax 0.25mg  for years, has not had withdrawal or complications from BDZ  Request refill today to continue, especially helpful PRN for breathing with COPD

## 2017-12-06 NOTE — Assessment & Plan Note (Signed)
Recent flare of reflux Known complication with dysphagia and history of aspiration Re order Omeprazole

## 2017-12-06 NOTE — Assessment & Plan Note (Signed)
Stable mildly elevated A1c at 6.1, from prior 6.2 - recent prednisone may increase Improving diet  Plan:  1. Not on any therapy currently  2. Encourage improved lifestyle - low carb, low sugar diet, reduce portion size, continue improving activity future regular exercise 3. Follow-up 4 months

## 2017-12-22 ENCOUNTER — Ambulatory Visit: Payer: BLUE CROSS/BLUE SHIELD | Admitting: Gastroenterology

## 2017-12-22 ENCOUNTER — Encounter (INDEPENDENT_AMBULATORY_CARE_PROVIDER_SITE_OTHER): Payer: Self-pay

## 2017-12-22 ENCOUNTER — Encounter: Payer: Self-pay | Admitting: Gastroenterology

## 2017-12-22 VITALS — BP 118/74 | HR 86 | Ht 73.0 in | Wt 200.0 lb

## 2017-12-22 DIAGNOSIS — R1312 Dysphagia, oropharyngeal phase: Secondary | ICD-10-CM

## 2017-12-22 DIAGNOSIS — K224 Dyskinesia of esophagus: Secondary | ICD-10-CM | POA: Diagnosis not present

## 2017-12-22 DIAGNOSIS — K222 Esophageal obstruction: Secondary | ICD-10-CM | POA: Diagnosis not present

## 2017-12-22 DIAGNOSIS — R1310 Dysphagia, unspecified: Secondary | ICD-10-CM | POA: Diagnosis not present

## 2017-12-22 DIAGNOSIS — R1319 Other dysphagia: Secondary | ICD-10-CM

## 2017-12-22 NOTE — Progress Notes (Signed)
Luke [name]    [id number]    [birth year]  Primary Care Physician:Karamalegos, Devonne Doughty, DO  Referring Physician: Olin Hauser, DO [address] [place], [place] [address]  Chief complaint: Dysphagia  HPI: Mr. Barthold is a 67 year old male here for new consult visit referred by Dr. Vicente Males.  He started having difficulty swallowing 6 months ago and is progressively worse in the past 2 months. Feels food gets hung up in his throat and can take sometimes a few hours before it goes down.  He has aspirated multiple times and also has chronic cough.  He had radiation to his neck which affected his saliva glands and does not make much saliva.   EGD August 2018 with findings suggestive of Schatzki's ring, dilated to 16 mm. EGD August 2019 showed decreased motility and esophageal body and possible distal esophageal sphincter spasm, was able to pass the scope without difficulty.  Biopsies showed features of reflux esophagitis Modified barium swallow August 2019 showed prominent retention with multiple consistencies and aspiration of thin liquid. Esophageal manometry showed elevated integrated relaxation pressure but did not meet criteria for achalasia  Outpatient Encounter Medications as of 12/22/2017  Medication Sig  . albuterol (PROVENTIL HFA;VENTOLIN HFA) 108 (90 Base) MCG/ACT inhaler Inhale 2 puffs into the lungs every 4 (four) hours as needed for wheezing or shortness of breath.  Marland Kitchen albuterol (PROVENTIL) (2.5 MG/3ML) 0.083% nebulizer solution Take 3 mLs (2.5 mg total) by nebulization every 2 (two) hours as needed for wheezing. Dx: copd exacerbation J44.1  . ALPRAZolam (XANAX) 0.25 MG tablet Take 1 tablet (0.25 mg total) by mouth daily as needed for anxiety.  Marland Kitchen levothyroxine (SYNTHROID, LEVOTHROID) 88 MCG tablet TAKE 1 TABLET BY MOUTH ONCE A DAY ON AN EMPTY STOMACH.  . meloxicam (MOBIC) 15 MG tablet Take 1 tablet by mouth daily  . Multiple Vitamins-Minerals (MULTIVITAMIN  PO) Take by mouth.  Marland Kitchen omeprazole (PRILOSEC) 20 MG capsule Take 1 capsule (20 mg total) by mouth daily before breakfast.  . [DISCONTINUED] levofloxacin (LEVAQUIN) 750 MG tablet TK 1 T PO ONCE D FOR 10 DAYS  . [DISCONTINUED] predniSONE (DELTASONE) 50 MG tablet Take 1 tablet (50 mg total) by mouth daily with breakfast. (Patient not taking: Reported on 12/22/2017)   No facility-administered encounter medications on file as of 12/22/2017.     Allergies as of 12/22/2017  . (No Known Allergies)    Past Medical History:  Diagnosis Date  . Anxiety   . Arthritis   . Carcinoma metastatic to lymph node ([place])   . Cataract    right  . COPD (chronic obstructive pulmonary disease) ([place])   . Depression   . GERD (gastroesophageal reflux disease)   . Hoarseness of voice   . Personal history of tobacco use, presenting hazards to health February of 2001  . Thyroid disease     Past Surgical History:  Procedure Laterality Date  . COLONOSCOPY    . COLONOSCOPY WITH PROPOFOL N/A 11/29/2016   Procedure: COLONOSCOPY WITH PROPOFOL;  Surgeon: Manya Silvas, MD;  Location: [facility] ENDOSCOPY;  Service: Endoscopy;  Laterality: N/A;  . COLONOSCOPY WITH PROPOFOL N/A 08/22/2017   Procedure: COLONOSCOPY WITH PROPOFOL;  Surgeon: Jonathon Bellows, MD;  Location: [facility] ENDOSCOPY;  Service: Gastroenterology;  Laterality: N/A;  . ESOPHAGEAL MANOMETRY N/A 10/05/2017   Procedure: ESOPHAGEAL MANOMETRY (EM);  Surgeon: Jonathon Bellows, MD;  Location: [facility] ENDOSCOPY;  Service: Gastroenterology;  Laterality: N/A;  . ESOPHAGOGASTRODUODENOSCOPY (EGD) WITH  PROPOFOL N/A 11/29/2016   Procedure: ESOPHAGOGASTRODUODENOSCOPY (EGD) WITH PROPOFOL;  Surgeon: Manya Silvas, MD;  Location: [facility] ENDOSCOPY;  Service: Endoscopy;  Laterality: N/A;  . ESOPHAGOGASTRODUODENOSCOPY (EGD) WITH PROPOFOL N/A 08/22/2017   Procedure: ESOPHAGOGASTRODUODENOSCOPY (EGD) WITH PROPOFOL;  Surgeon: Jonathon Bellows, MD;  Location: [facility] ENDOSCOPY;  Service: Gastroenterology;   Laterality: N/A;  . NECK SURGERY     status post diagnosis of cancer with chemo and radiation  . TONSILLECTOMY      Family History  Problem Relation Age of Onset  . Breast cancer Mother   . Heart disease Mother   . Cancer Mother        breast, back, bladder  . Heart attack Father   . Heart disease Father     Social History   Socioeconomic History  . Marital status: Single    Spouse name: Not on file  . Number of children: Not on file  . Years of education: [employer]  . Highest education level: High school graduate  Occupational History  . Not on file  Social Needs  . Financial resource strain: Not on file  . Food insecurity:    Worry: Not on file    Inability: Not on file  . Transportation needs:    Medical: Not on file    Non-medical: Not on file  Tobacco Use  . Smoking status: Current Some Day Smoker    Packs/day: 0.25    Years: 40.00    Pack years: 10.00    Types: Cigarettes  . Smokeless tobacco: Current User  Substance and Sexual Activity  . Alcohol use: No  . Drug use: No  . Sexual activity: Not on file  Lifestyle  . Physical activity:    Days per week: Not on file    Minutes per session: Not on file  . Stress: Not on file  Relationships  . Social connections:    Talks on phone: Not on file    Gets together: Not on file    Attends religious service: Not on file    Active member of club or organization: Not on file    Attends meetings of clubs or organizations: Not on file    Relationship status: Not on file  . Intimate partner violence:    Fear of current or ex partner: Not on file    Emotionally abused: Not on file    Physically abused: Not on file    Forced sexual activity: Not on file  Other Topics Concern  . Not on file  Social History Narrative  . Not on file      Review of systems: Review of Systems  Constitutional: Negative for fever and chills. Positive for fatigue HENT: Positive for sinus trouble and hearing problem Eyes:  Negative for blurred vision.  Respiratory: Positive for cough, shortness of breath and wheezing.   Cardiovascular: Negative for chest pain and palpitations.  Gastrointestinal: as per HPI Genitourinary: Negative for dysuria, urgency, frequency and hematuria.  Musculoskeletal: Negative for myalgias, back pain and joint pain.  Skin: Negative for itching and rash.  Neurological: Negative for dizziness, tremors, focal weakness, seizures and loss of consciousness.  Endo/Heme/Allergies: Positive for seasonal allergies.  Psychiatric/Behavioral: Negative for depression, suicidal ideas and hallucinations.  All other systems reviewed and are negative.   Physical Exam: Vitals:   12/22/17 0857  BP: 118/74  Pulse: 86   Body mass index is 26.39 kg/m. Gen:      No acute distress HEENT:  EOMI, sclera anicteric Neck:  No masses; no thyromegaly Lungs:    Clear to auscultation bilaterally; normal respiratory effort CV:         Regular rate and rhythm; no murmurs Abd:      + bowel sounds; soft, non-tender; no palpable masses, no distension Ext:    No edema; adequate peripheral perfusion Skin:      Warm and dry; no rash Neuro: alert and oriented x 3 Psych: normal mood and affect  Data Reviewed:  Reviewed labs, radiology imaging, old records and pertinent past GI work up   Assessment and Plan/Recommendations:  67 year old male with worsening dysphagia (multi-factorial) He has oropharyngeal dysphagia and also likely esophageal dysmotility secondary to radiation Dry mouth also contributing 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  Will place the catheter during EGD while he is sedated, as he hard time during prior  study and didn't like the catheter insertion  The risks and benefits as well as alternatives of endoscopic procedure(s) have been discussed and reviewed. All questions answered. The patient agrees to proceed.  Damaris Hippo , MD [phone]    CC: Nobie Putnam *

## 2017-12-22 NOTE — Patient Instructions (Signed)
You are having and endoscopy at [facility] on 01/30/2018 at 12pm to arrive at 10:30am   You have been scheduled for an esophageal manometry at [facility] on 01/30/18 at 12pm. Please arrive 30 minutes prior to your procedure for registration. You will need to go to outpatient registration (1st floor of the hospital) first. Make certain to bring your insurance cards as well as a complete list of medications.  Please remember the following:  1) Do not take any muscle relaxants, xanax (alprazolam) or ativan for 1 day prior to your test as well as the day of the test.  2) Nothing to eat or drink for 4 hours before your test.  3) Hold all diabetic medications/insulin the morning of the test. You may eat and take your medications after the test.  It will take at least 2 weeks to receive the results of this test from your physician. ------------------------------------------ ABOUT ESOPHAGEAL MANOMETRY Esophageal manometry (muh-NOM-uh-tree) is a test that gauges how well your esophagus works. Your esophagus is the long, muscular tube that connects your throat to your stomach. Esophageal manometry measures the rhythmic muscle contractions (peristalsis) that occur in your esophagus when you swallow. Esophageal manometry also measures the coordination and force exerted by the muscles of your esophagus.  During esophageal manometry, a thin, flexible tube (catheter) that contains sensors is passed through your nose, down your esophagus and into your stomach. Esophageal manometry can be helpful in diagnosing some mostly uncommon disorders that affect your esophagus.  Why it's done Esophageal manometry is used to evaluate the movement (motility) of food through the esophagus and into the stomach. The test measures how well the circular bands of muscle (sphincters) at the top and bottom of your esophagus open and close, as well as the pressure, strength and pattern of the wave of esophageal muscle contractions  that moves food along.  What you can expect Esophageal manometry is an outpatient procedure done without sedation. Most people tolerate it well. You may be asked to change into a hospital gown before the test starts.  During esophageal manometry  . While you are sitting up, a member of your health care team sprays your throat with a numbing medication or puts numbing gel in your nose or both.  . A catheter is guided through your nose into your esophagus. The catheter may be sheathed in a water-filled sleeve. It doesn't interfere with your breathing. However, your eyes may water, and you may gag. You may have a slight nosebleed from irritation.  . After the catheter is in place, you may be asked to lie on your back on an exam table, or you may be asked to remain seated.  . You then swallow small sips of water. As you do, a computer connected to the catheter records the pressure, strength and pattern of your esophageal muscle contractions.  . During the test, you'll be asked to breathe slowly and smoothly, remain as still as possible, and swallow only when you're asked to do so.  . A member of your health care team may move the catheter down into your stomach while the catheter continues its measurements.  . The catheter then is slowly withdrawn. The test usually lasts 20 to 30 minutes.  After esophageal manometry  When your esophageal manometry is complete, you may return to your normal activities  This test typically takes 30-45 minutes to complete. ________________________________________________________________________________   Eat small frequent meals   Gastroesophageal Reflux Disease, Adult Normally, food travels down  the esophagus and stays in the stomach to be digested. However, when a person has gastroesophageal reflux disease (GERD), food and stomach acid move back up into the esophagus. When this happens, the esophagus becomes sore and inflamed. Over time, GERD can create small holes  (ulcers) in the lining of the esophagus. What are the causes? This condition is caused by a problem with the muscle between the esophagus and the stomach (lower esophageal sphincter, or LES). Normally, the LES muscle closes after food passes through the esophagus to the stomach. When the LES is weakened or abnormal, it does not close properly, and that allows food and stomach acid to go back up into the esophagus. The LES can be weakened by certain dietary substances, medicines, and medical conditions, including:  Tobacco use.  Pregnancy.  Having a hiatal hernia.  Heavy alcohol use.  Certain foods and beverages, such as coffee, chocolate, onions, and peppermint.  What increases the risk? This condition is more likely to develop in:  People who have an increased body weight.  People who have connective tissue disorders.  People who use NSAID medicines.  What are the signs or symptoms? Symptoms of this condition include:  Heartburn.  Difficult or painful swallowing.  The feeling of having a lump in the throat.  Abitter taste in the mouth.  Bad breath.  Having a large amount of saliva.  Having an upset or bloated stomach.  Belching.  Chest pain.  Shortness of breath or wheezing.  Ongoing (chronic) cough or a night-time cough.  Wearing away of tooth enamel.  Weight loss.  Different conditions can cause chest pain. Make sure to see your health care provider if you experience chest pain. How is this diagnosed? Your health care provider will take a medical history and perform a physical exam. To determine if you have mild or severe GERD, your health care provider may also monitor how you respond to treatment. You may also have other tests, including:  An endoscopy toexamine your stomach and esophagus with a small camera.  A test thatmeasures the acidity level in your esophagus.  A test thatmeasures how much pressure is on your esophagus.  A barium swallow or  modified barium swallow to show the shape, size, and functioning of your esophagus.  How is this treated? The goal of treatment is to help relieve your symptoms and to prevent complications. Treatment for this condition may vary depending on how severe your symptoms are. Your health care provider may recommend:  Changes to your diet.  Medicine.  Surgery.  Follow these instructions at home: Diet  Follow a diet as recommended by your health care provider. This may involve avoiding foods and drinks such as: ? Coffee and tea (with or without caffeine). ? Drinks that containalcohol. ? Energy drinks and sports drinks. ? Carbonated drinks or sodas. ? Chocolate and cocoa. ? Peppermint and mint flavorings. ? Garlic and onions. ? Horseradish. ? Spicy and acidic foods, including peppers, chili powder, curry powder, vinegar, hot sauces, and barbecue sauce. ? Citrus fruit juices and citrus fruits, such as oranges, lemons, and limes. ? Tomato-based foods, such as red sauce, chili, salsa, and pizza with red sauce. ? Fried and fatty foods, such as donuts, french fries, potato chips, and high-fat dressings. ? High-fat meats, such as hot dogs and fatty cuts of red and white meats, such as rib eye steak, sausage, ham, and bacon. ? High-fat dairy items, such as whole milk, butter, and cream cheese.  Eat small,  frequent meals instead of large meals.  Avoid drinking large amounts of liquid with your meals.  Avoid eating meals during the 2-3 hours before bedtime.  Avoid lying down right after you eat.  Do not exercise right after you eat. General instructions  Pay attention to any changes in your symptoms.  Take over-the-counter and prescription medicines only as told by your health care provider. Do not take aspirin, ibuprofen, or other NSAIDs unless your health care provider told you to do so.  Do not use any tobacco products, including cigarettes, chewing tobacco, and e-cigarettes. If you  need help quitting, ask your health care provider.  Wear loose-fitting clothing. Do not wear anything tight around your waist that causes pressure on your abdomen.  Raise (elevate) the head of your bed 6 inches (15cm).  Try to reduce your stress, such as with yoga or meditation. If you need help reducing stress, ask your health care provider.  If you are overweight, reduce your weight to an amount that is healthy for you. Ask your health care provider for guidance about a safe weight loss goal.  Keep all follow-up visits as told by your health care provider. This is important. Contact a health care provider if:  You have new symptoms.  You have unexplained weight loss.  You have difficulty swallowing, or it hurts to swallow.  You have wheezing or a persistent cough.  Your symptoms do not improve with treatment.  You have a hoarse voice. Get help right away if:  You have pain in your arms, neck, jaw, teeth, or back.  You feel sweaty, dizzy, or light-headed.  You have chest pain or shortness of breath.  You vomit and your vomit looks like blood or coffee grounds.  You faint.  Your stool is bloody or black.  You cannot swallow, drink, or eat. This information is not intended to replace advice given to you by your health care provider. Make sure you discuss any questions you have with your health care provider. Document Released: 10/07/2004 Document Revised: 05/28/2015 Document Reviewed: 04/24/2014 Elsevier Interactive Patient Education  [employer].

## 2018-01-05 ENCOUNTER — Ambulatory Visit: Payer: BLUE CROSS/BLUE SHIELD | Attending: Internal Medicine

## 2018-01-05 DIAGNOSIS — J449 Chronic obstructive pulmonary disease, unspecified: Secondary | ICD-10-CM | POA: Diagnosis not present

## 2018-01-05 MED ORDER — ALBUTEROL SULFATE (2.5 MG/3ML) 0.083% IN NEBU
2.5000 mg | INHALATION_SOLUTION | Freq: Once | RESPIRATORY_TRACT | Status: AC
  Administered 2018-01-05: 2.5 mg via RESPIRATORY_TRACT
  Filled 2018-01-05: qty 3

## 2018-01-19 ENCOUNTER — Other Ambulatory Visit: Payer: Self-pay | Admitting: Family Medicine

## 2018-01-19 DIAGNOSIS — Z8579 Personal history of other malignant neoplasms of lymphoid, hematopoietic and related tissues: Secondary | ICD-10-CM

## 2018-01-19 MED ORDER — LEVOTHYROXINE SODIUM 88 MCG PO TABS: ORAL_TABLET | ORAL | 1 refills | Status: DC

## 2018-01-19 MED ORDER — MELOXICAM 15 MG PO TABS: 15.0000 mg | ORAL_TABLET | Freq: Every day | ORAL | 2 refills | Status: DC

## 2018-01-19 NOTE — Telephone Encounter (Signed)
Pt needs refills on meloxicam and levothyroxine sent to [facility] [phone]

## 2018-01-30 ENCOUNTER — Encounter (HOSPITAL_COMMUNITY)
Admission: RE | Disposition: A | Discharge status: Home / Self Care | Payer: Self-pay | Source: Home / Self Care | Attending: Gastroenterology

## 2018-01-30 ENCOUNTER — Ambulatory Visit (HOSPITAL_COMMUNITY): Payer: BLUE CROSS/BLUE SHIELD | Admitting: Certified Registered Nurse Anesthetist

## 2018-01-30 ENCOUNTER — Other Ambulatory Visit: Payer: Self-pay

## 2018-01-30 ENCOUNTER — Ambulatory Visit (HOSPITAL_COMMUNITY)
Admission: RE | Admit: 2018-01-30 | Discharge: 2018-01-30 | Disposition: A | Discharge status: Home / Self Care | Payer: BLUE CROSS/BLUE SHIELD | Attending: Gastroenterology | Admitting: Gastroenterology

## 2018-01-30 ENCOUNTER — Encounter (HOSPITAL_COMMUNITY): Payer: Self-pay | Admitting: Certified Registered Nurse Anesthetist

## 2018-01-30 DIAGNOSIS — R1310 Dysphagia, unspecified: Secondary | ICD-10-CM

## 2018-01-30 DIAGNOSIS — E079 Disorder of thyroid, unspecified: Secondary | ICD-10-CM | POA: Diagnosis not present

## 2018-01-30 DIAGNOSIS — M1990 Unspecified osteoarthritis, unspecified site: Secondary | ICD-10-CM | POA: Insufficient documentation

## 2018-01-30 DIAGNOSIS — K228 Other specified diseases of esophagus: Secondary | ICD-10-CM

## 2018-01-30 DIAGNOSIS — F329 Major depressive disorder, single episode, unspecified: Secondary | ICD-10-CM | POA: Insufficient documentation

## 2018-01-30 DIAGNOSIS — F419 Anxiety disorder, unspecified: Secondary | ICD-10-CM | POA: Diagnosis not present

## 2018-01-30 DIAGNOSIS — K220 Achalasia of cardia: Secondary | ICD-10-CM | POA: Diagnosis not present

## 2018-01-30 DIAGNOSIS — F17210 Nicotine dependence, cigarettes, uncomplicated: Secondary | ICD-10-CM | POA: Diagnosis not present

## 2018-01-30 DIAGNOSIS — Z791 Long term (current) use of non-steroidal anti-inflammatories (NSAID): Secondary | ICD-10-CM | POA: Insufficient documentation

## 2018-01-30 DIAGNOSIS — I10 Essential (primary) hypertension: Secondary | ICD-10-CM | POA: Insufficient documentation

## 2018-01-30 DIAGNOSIS — Z79899 Other long term (current) drug therapy: Secondary | ICD-10-CM | POA: Diagnosis not present

## 2018-01-30 DIAGNOSIS — R1319 Other dysphagia: Secondary | ICD-10-CM

## 2018-01-30 DIAGNOSIS — J449 Chronic obstructive pulmonary disease, unspecified: Secondary | ICD-10-CM | POA: Diagnosis not present

## 2018-01-30 DIAGNOSIS — K222 Esophageal obstruction: Secondary | ICD-10-CM

## 2018-01-30 DIAGNOSIS — K219 Gastro-esophageal reflux disease without esophagitis: Secondary | ICD-10-CM | POA: Insufficient documentation

## 2018-01-30 HISTORY — PX: ESOPHAGOGASTRODUODENOSCOPY (EGD) WITH PROPOFOL: SHX5813

## 2018-01-30 HISTORY — PX: ESOPHAGEAL MANOMETRY: SHX5429

## 2018-01-30 HISTORY — PX: BALLOON DILATION: SHX5330

## 2018-01-30 SURGERY — MANOMETRY, ESOPHAGUS

## 2018-01-30 SURGERY — ESOPHAGOGASTRODUODENOSCOPY (EGD) WITH PROPOFOL
Anesthesia: Monitor Anesthesia Care

## 2018-01-30 MED ORDER — PROPOFOL 10 MG/ML IV BOLUS
INTRAVENOUS | Status: AC
  Filled 2018-01-30: qty 40

## 2018-01-30 MED ORDER — LACTATED RINGERS IV SOLN
INTRAVENOUS | Status: DC
  Administered 2018-01-30: 1000 mL via INTRAVENOUS

## 2018-01-30 MED ORDER — PROPOFOL 500 MG/50ML IV EMUL
INTRAVENOUS | Status: DC | PRN
  Administered 2018-01-30 (×2): 30 mg via INTRAVENOUS

## 2018-01-30 MED ORDER — PHENYLEPHRINE HCL 10 MG/ML IJ SOLN
INTRAMUSCULAR | Status: DC | PRN
  Administered 2018-01-30: 120 ug via INTRAVENOUS

## 2018-01-30 MED ORDER — SODIUM CHLORIDE 0.9 % IV SOLN: INTRAVENOUS | Status: DC

## 2018-01-30 MED ORDER — PROPOFOL 500 MG/50ML IV EMUL
INTRAVENOUS | Status: DC | PRN
  Administered 2018-01-30: 150 ug/kg/min via INTRAVENOUS

## 2018-01-30 SURGICAL SUPPLY — 15 items

## 2018-01-30 SURGICAL SUPPLY — 2 items
FACESHIELD LNG OPTICON STERILE (SAFETY) IMPLANT
GLOVE BIO SURGEON STRL SZ8 (GLOVE) ×4 IMPLANT

## 2018-01-30 NOTE — Anesthesia Preprocedure Evaluation (Signed)
Anesthesia Evaluation  Patient identified by MRN, date of birth, ID band Patient awake    Reviewed: Allergy & Precautions, NPO status , Patient's Chart, lab work & pertinent test results  Airway Mallampati: I  TM Distance: >3 FB Neck ROM: Full    Dental   Pulmonary Current Smoker,    Pulmonary exam normal        Cardiovascular hypertension, Pt. on medications Normal cardiovascular exam     Neuro/Psych Anxiety Depression    GI/Hepatic GERD  Medicated and Controlled,  Endo/Other    Renal/GU      Musculoskeletal   Abdominal   Peds  Hematology   Anesthesia Other Findings   Reproductive/Obstetrics                             Anesthesia Physical Anesthesia Plan  ASA: III  Anesthesia Plan: MAC   Post-op Pain Management:    Induction: Intravenous  PONV Risk Score and Plan: 0  Airway Management Planned: LMA  Additional Equipment:   Intra-op Plan:   Post-operative Plan: Extubation in OR  Informed Consent: I have reviewed the patients History and Physical, chart, labs and discussed the procedure including the risks, benefits and alternatives for the proposed anesthesia with the patient or authorized representative who has indicated his/her understanding and acceptance.       Plan Discussed with: CRNA and Surgeon  Anesthesia Plan Comments:         Anesthesia Quick Evaluation

## 2018-01-30 NOTE — Discharge Instructions (Signed)
YOU HAD AN ENDOSCOPIC PROCEDURE TODAY: Refer to the procedure report and other information in the discharge instructions given to you for any specific questions about what was found during the examination. If this information does not answer your questions, please call [place] office at [phone] to clarify.   YOU SHOULD EXPECT: Some feelings of bloating in the abdomen. Passage of more gas than usual. Walking can help get rid of the air that was put into your GI tract during the procedure and reduce the bloating. Some abdominal soreness may be present for a day or two, also.  DIET: Your first meal following the procedure should be a light meal and then it is ok to progress to your normal diet. A half-sandwich or bowl of soup is an example of a good first meal. Heavy or fried foods are harder to digest and may make you feel nauseous or bloated. Drink plenty of fluids but you should avoid alcoholic beverages for 24 hours. If you had a esophageal dilation, please see attached instructions for diet.    ACTIVITY: Your care partner should take you home directly after the procedure. You should plan to take it easy, moving slowly for the rest of the day. You can resume normal activity the day after the procedure however YOU SHOULD NOT DRIVE, use power tools, machinery or perform tasks that involve climbing or major physical exertion for 24 hours (because of the sedation medicines used during the test).   SYMPTOMS TO REPORT IMMEDIATELY: A gastroenterologist can be reached at any hour. Please call [phone]  for any of the following symptoms:   Following upper endoscopy (EGD, EUS, ERCP, esophageal dilation) Vomiting of blood or coffee ground material  New, significant abdominal pain  New, significant chest pain or pain under the shoulder blades  Painful or persistently difficult swallowing  New shortness of breath  Black, tarry-looking or red, bloody stools  FOLLOW UP:  If any biopsies were taken you  will be contacted by phone or by letter within the next 1-3 weeks. Call [phone]  if you have not heard about the biopsies in 3 weeks.  Please also call with any specific questions about appointments or follow up tests.  

## 2018-01-30 NOTE — Progress Notes (Signed)
Esophageal Manometry done per protocol. Probe was placed during EGD. Pt tolerated well. Probe removed after test without complication.

## 2018-01-30 NOTE — Anesthesia Postprocedure Evaluation (Signed)
Anesthesia Post Note  Patient: KARLIN HEILMAN  Procedure(s) Performed: ESOPHAGOGASTRODUODENOSCOPY (EGD) WITH PROPOFOL (N/A ) BALLOON DILATION (N/A )     Patient location during evaluation: PACU Anesthesia Type: MAC Level of consciousness: awake and alert Pain management: pain level controlled Vital Signs Assessment: post-procedure vital signs reviewed and stable Respiratory status: spontaneous breathing, nonlabored ventilation, respiratory function stable and patient connected to nasal cannula oxygen Cardiovascular status: stable and blood pressure returned to baseline Postop Assessment: no apparent nausea or vomiting Anesthetic complications: no    Last Vitals:  Vitals:   01/30/18 1320 01/30/18 1330  BP: 124/86 (!) 121/98  Pulse: (!) 59 62  Resp: 13 14  Temp:    SpO2: 94% 94%    Last Pain:  Vitals:   01/30/18 1320  TempSrc:   PainSc: 0-No pain                 Bawi Lakins DAVID

## 2018-01-30 NOTE — Transfer of Care (Signed)
Immediate Anesthesia Transfer of Care Note  Patient: Luke [name]  Procedure(s) Performed: ESOPHAGOGASTRODUODENOSCOPY (EGD) WITH PROPOFOL (N/A ) BALLOON DILATION (N/A )  Patient Location: PACU  Anesthesia Type:MAC  Level of Consciousness: sedated, patient cooperative and responds to stimulation  Airway & Oxygen Therapy: Patient Spontanous Breathing and Patient connected to face mask oxygen  Post-op Assessment: Report given to RN and Post -op Vital signs reviewed and stable  Post vital signs: Reviewed and stable  Last Vitals:  Vitals Value Taken Time  BP 82/59 01/30/2018  1:00 PM  Temp    Pulse 55 01/30/2018  1:04 PM  Resp 13 01/30/2018  1:04 PM  SpO2 100 % 01/30/2018  1:04 PM  Vitals shown include unvalidated device data.  Last Pain:  Vitals:   01/30/18 1034  TempSrc: Oral  PainSc: 0-No pain         Complications: No apparent anesthesia complications

## 2018-01-30 NOTE — Op Note (Signed)
[facility] Patient Name: Luke [name] Procedure Date: 01/30/2018 MRN: [id number] Attending MD: Mauri Pole , MD Date of Birth: 02-21-1950 CSN: [id number] Age: 68 Admit Type: Ambulatory Procedure:                Upper GI endoscopy Indications:              Dysphagia Providers:                Mauri Pole, MD, Elmer Ramp. Tilden Dome, RN, Tinnie Gens, Technician, Herbie Drape, CRNA Referring MD:              Medicines:                Monitored Anesthesia Care Complications:            No immediate complications. Estimated Blood Loss:     Estimated blood loss was minimal. Procedure:                Pre-Anesthesia Assessment:                           - Prior to the procedure, a History and Physical                            was performed, and patient medications and                            allergies were reviewed. The patient's tolerance of                            previous anesthesia was also reviewed. The risks                            and benefits of the procedure and the sedation                            options and risks were discussed with the patient.                            All questions were answered, and informed consent                            was obtained. Prior Anticoagulants: The patient has                            taken no previous anticoagulant or antiplatelet                            agents. ASA Grade Assessment: III - A patient with                            severe systemic disease. After reviewing the risks  and benefits, the patient was deemed in                            satisfactory condition to undergo the procedure.                           After obtaining informed consent, the endoscope was                            passed under direct vision. Throughout the                            procedure, the patient's blood pressure, pulse, and   oxygen saturations were monitored continuously. The                            GIF-H190 ([id number] Olympus gastroscope was                            introduced through the mouth, and advanced to the                            second part of duodenum. The upper GI endoscopy was                            accomplished without difficulty. The patient                            tolerated the procedure well. Scope In: Scope Out: Findings:      One benign-appearing, intrinsic mild (non-circumferential scarring)       stenosis was found 38 to 39 cm from the incisors. This stenosis measured       1.8 cm (inner diameter) x less than one cm (in length). The stenosis was       traversed. A TTS dilator was passed through the scope. Dilation with an       18-19-20 mm x 8 cm CRE balloon dilator was performed to 20 mm. The       dilation site was examined following endoscope reinsertion and showed no       change.      Two 5 mm submucosal nodules with a localized distribution were found in       the lower third of the esophagus at 38 and 40 cm from the incisors.      The Z-line was regular and was found 42 cm from the incisors. A       esophageal manometry catheter was advanced into the stomach. Placement       was confirmed by scope visualization.      The stomach was normal.      The cardia and gastric fundus were normal on retroflexion.      The examined duodenum was normal.      Thick mucus in oro pharynx and posterior pharynx, suctioned Impression:               - Benign-appearing esophageal schatzki's ring.  Dilated.                           - Submucosal nodule X2 found in the distal                            esophagus.                           - Z-line regular, 42 cm from the incisors.                           - Normal stomach.                           - Normal examined duodenum.                           - No specimens collected. Moderate Sedation:      Not  Applicable - Patient had care per Anesthesia. Recommendation:           - Patient has a contact number available for                            emergencies. The signs and symptoms of potential                            delayed complications were discussed with the                            patient. Return to normal activities tomorrow.                            Written discharge instructions were provided to the                            patient.                           - Resume previous diet.                           - Continue present medications.                           - See the other procedure note (Esophageal                            Manometry) for documentation of additional                            recommendations. Procedure Code(s):        --- Professional ---                           [phone], Esophagogastroduodenoscopy, flexible,  transoral; with transendoscopic balloon dilation of                            esophagus (less than 30 mm diameter)                           [id number], Esophagogastroduodenoscopy, flexible,                            transoral; with insertion of intraluminal tube or                            catheter Diagnosis Code(s):        --- Professional ---                           K22.2, Esophageal obstruction                           K22.8, Other specified diseases of esophagus                           R13.10, Dysphagia, unspecified CPT copyright 2018 [employer] Association. All rights reserved. The codes documented in this report are preliminary and upon coder review may  be revised to meet current compliance requirements. Mauri Pole, MD 01/30/2018 1:16:48 PM This report has been signed electronically. Number of Addenda: 0

## 2018-01-30 NOTE — H&P (Signed)
[facility] History and Physical   Primary Care Physician:  Olin Hauser, DO   Reason for Procedure:   Dysphagia, ?Achalasia Plan:    EGD with placement of manometry catheter     HPI: Luke [name] is a 68 y.o. male with progressive dysphagia and evidence of a GJ outflow obstruction here for EGD with placement of esophageal manometric catheter as patient was unable to tolerated before very well.    Past Medical History:  Diagnosis Date  . Anxiety   . Arthritis   . Carcinoma metastatic to lymph node ([place])   . Cataract    right  . COPD (chronic obstructive pulmonary disease) ([place])   . Depression   . GERD (gastroesophageal reflux disease)   . Hoarseness of voice   . Personal history of tobacco use, presenting hazards to health February of 2001  . Thyroid disease     Past Surgical History:  Procedure Laterality Date  . COLONOSCOPY    . COLONOSCOPY WITH PROPOFOL N/A 11/29/2016   Procedure: COLONOSCOPY WITH PROPOFOL;  Surgeon: Manya Silvas, MD;  Location: [facility] ENDOSCOPY;  Service: Endoscopy;  Laterality: N/A;  . COLONOSCOPY WITH PROPOFOL N/A 08/22/2017   Procedure: COLONOSCOPY WITH PROPOFOL;  Surgeon: Jonathon Bellows, MD;  Location: [facility] ENDOSCOPY;  Service: Gastroenterology;  Laterality: N/A;  . ESOPHAGEAL MANOMETRY N/A 10/05/2017   Procedure: ESOPHAGEAL MANOMETRY (EM);  Surgeon: Jonathon Bellows, MD;  Location: [facility] ENDOSCOPY;  Service: Gastroenterology;  Laterality: N/A;  . ESOPHAGOGASTRODUODENOSCOPY (EGD) WITH PROPOFOL N/A 11/29/2016   Procedure: ESOPHAGOGASTRODUODENOSCOPY (EGD) WITH PROPOFOL;  Surgeon: Manya Silvas, MD;  Location: [facility] ENDOSCOPY;  Service: Endoscopy;  Laterality: N/A;  . ESOPHAGOGASTRODUODENOSCOPY (EGD) WITH PROPOFOL N/A 08/22/2017   Procedure: ESOPHAGOGASTRODUODENOSCOPY (EGD) WITH PROPOFOL;  Surgeon: Jonathon Bellows, MD;  Location: [facility] ENDOSCOPY;  Service: Gastroenterology;  Laterality: N/A;  . NECK SURGERY     status post diagnosis of  cancer with chemo and radiation  . TONSILLECTOMY      Prior to Admission medications   Medication Sig Start Date End Date Taking? Authorizing Provider  albuterol (PROVENTIL HFA;VENTOLIN HFA) 108 (90 Base) MCG/ACT inhaler Inhale 2 puffs into the lungs every 4 (four) hours as needed for wheezing or shortness of breath. 12/06/17  Yes Karamalegos, Devonne Doughty, DO  levothyroxine (SYNTHROID, LEVOTHROID) 88 MCG tablet TAKE 1 TABLET BY MOUTH ONCE A DAY ON AN EMPTY STOMACH. Patient taking differently: Take 88 mcg by mouth daily before breakfast.  01/19/18  Yes Karamalegos, Devonne Doughty, DO  meloxicam (MOBIC) 15 MG tablet Take 1 tablet (15 mg total) by mouth daily. 01/19/18  Yes Karamalegos, Devonne Doughty, DO  Multiple Vitamins-Minerals (MULTIVITAMIN PO) Take 1 tablet by mouth daily.    Yes [provider]  omeprazole (PRILOSEC) 20 MG capsule Take 1 capsule (20 mg total) by mouth daily before breakfast. Patient taking differently: Take 20 mg by mouth daily as needed (heartburn).  12/06/17  Yes Karamalegos, Alexander J, DO  albuterol (PROVENTIL) (2.5 MG/3ML) 0.083% nebulizer solution Take 3 mLs (2.5 mg total) by nebulization every 2 (two) hours as needed for wheezing. Dx: copd exacerbation J44.1 07/03/17   Loletha Grayer, MD  ALPRAZolam Duanne Moron) 0.25 MG tablet Take 1 tablet (0.25 mg total) by mouth daily as needed for anxiety. Patient not taking: Reported on 01/24/2018 12/06/17   Olin Hauser, DO    Current Facility-Administered Medications  Medication Dose Route Frequency Provider Last Rate Last Dose  . 0.9 %  sodium chloride infusion   Intravenous Continuous Nandigam, Karleen Hampshire  V, MD      . lactated ringers infusion   Intravenous Continuous Nandigam, Kavitha V, MD 10 mL/hr at 01/30/18 1045 1,000 mL at 01/30/18 1045    Allergies as of 12/22/2017  . (No Known Allergies)    Family History  Problem Relation Age of Onset  . Breast cancer Mother   . Heart disease Mother   . Cancer Mother         breast, back, bladder  . Heart attack Father   . Heart disease Father     Social History   Socioeconomic History  . Marital status: Single    Spouse name: Not on file  . Number of children: Not on file  . Years of education: [employer]  . Highest education level: High school graduate  Occupational History  . Not on file  Social Needs  . Financial resource strain: Not on file  . Food insecurity:    Worry: Not on file    Inability: Not on file  . Transportation needs:    Medical: Not on file    Non-medical: Not on file  Tobacco Use  . Smoking status: Current Some Day Smoker    Packs/day: 0.25    Years: 40.00    Pack years: 10.00    Types: Cigarettes  . Smokeless tobacco: Current User  Substance and Sexual Activity  . Alcohol use: No  . Drug use: No  . Sexual activity: Not on file  Lifestyle  . Physical activity:    Days per week: Not on file    Minutes per session: Not on file  . Stress: Not on file  Relationships  . Social connections:    Talks on phone: Not on file    Gets together: Not on file    Attends religious service: Not on file    Active member of club or organization: Not on file    Attends meetings of clubs or organizations: Not on file    Relationship status: Not on file  . Intimate partner violence:    Fear of current or ex partner: Not on file    Emotionally abused: Not on file    Physically abused: Not on file    Forced sexual activity: Not on file  Other Topics Concern  . Not on file  Social History Narrative  . Not on file    Review of Systems:  All other review of systems negative except as mentioned in the HPI.  Physical Exam: Vital signs in last 24 hours: Temp:  [98.2 F (36.8 C)] 98.2 F (36.8 C) (01/20 1034) Pulse Rate:  [75] 75 (01/20 1034) Resp:  [16] 16 (01/20 1034) BP: (119)/(91) 119/91 (01/20 1057) Weight:  [88.5 kg] 88.5 kg (01/20 1034)   General:   Alert,  Well-developed, well-nourished, pleasant and  cooperative in NAD Lungs:  Clear throughout to auscultation.   Heart:  Regular rate and rhythm; no murmurs, clicks, rubs,  or gallops. Abdomen:  Soft, nontender and nondistended. Normal bowel sounds.   Neuro/Psych:  Alert and cooperative. Normal mood and affect. A and O x 3   K. Denzil Magnuson , MD [phone]

## 2018-01-31 DIAGNOSIS — K222 Esophageal obstruction: Secondary | ICD-10-CM | POA: Diagnosis not present

## 2018-01-31 DIAGNOSIS — R1310 Dysphagia, unspecified: Secondary | ICD-10-CM | POA: Diagnosis not present

## 2018-02-01 ENCOUNTER — Encounter (HOSPITAL_COMMUNITY): Payer: Self-pay | Admitting: Gastroenterology

## 2018-02-02 ENCOUNTER — Other Ambulatory Visit: Payer: Self-pay

## 2018-02-02 DIAGNOSIS — R1319 Other dysphagia: Secondary | ICD-10-CM

## 2018-02-02 DIAGNOSIS — K229 Disease of esophagus, unspecified: Secondary | ICD-10-CM

## 2018-02-02 DIAGNOSIS — R1310 Dysphagia, unspecified: Secondary | ICD-10-CM

## 2018-02-02 DIAGNOSIS — K228 Other specified diseases of esophagus: Secondary | ICD-10-CM

## 2018-02-20 NOTE — Progress Notes (Deleted)
[facility]     Assessment and Plan:  COPD/emphysema. - COPD with recent exacerbation/pneumonia.  Currently symptoms are significantly improved with no dyspnea on exertion or at rest. - Continue with smoking cessation, no need for inhalers at this time as the patient does not appear to have dyspnea symptoms. - Patient has been enrolled in lung cancer screening, last low-dose scan was in 2017, will make sure that he is followed up by cancer center.  Aspiration pneumonia/GERD/dysphagia. - Patient has difficulty with swallowing, and often regurgitates foods, which may be contributing to aspiration. - Should follow-up with gastroenterology, patient has an appointment scheduled for next week.  Nicotine abuse. - Recently quit smoking and has been feeling better since that time. - Encouraged continued smoking cessation.   Date: 02/20/2018  MRN# [id number] Luke [name] 09/07/1950    Luke [name] is a 68 y.o. old male seen in consultation for chief complaint of:    No chief complaint on file.   HPI:   Luke [name] is a 68 y.o. male with a history of anxiety, arthritis, metastatic carcinoma, COPD, depression, GERD.  He was admitted to the hospital from 6/20 to 07/03/17 with acute exacerbation of COPD, aspiration pneumonia, and continued smoking.  Pt is here with his sister, he is feeling "100%" better since getting out of the hospital. He no longer smoking. He is on not on any inhalers at this time.  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  In this job he is on his feet 7 days a week for much of the day. He used to smoke for 2 ppd for about 30 years.  He has a dog at home, in bedroom but not in bed.  He does have a lot reflux symptoms, and when he eats he coughs a lot. He coughs every time he eats, he feels that things get stuck in his throat. He then will bend over and the food comes out on it's own. This happens about 3-4 times per week. He no longer eats  hamburger because it will get stuck in his throat. He mostly has trouble with dry foods, he does not have a saliva glands after radiation for cancer in his neck lymph node about 17 years ago.  Has some sinus drainage, he takes an OTC claritin which controls things.  He is enrolled in lung cancer screening, last scan was in 2017.    He takes omeprazole daily. He has never seen GI, but has an appt to see them next week.   **CT chest 04/12/2016, chest x-ray 07/01/2017>> imaging personally reviewed, changes of chronic bronchitis/COPD. **CBC 02/13/2016>> absolute eosinophil count 200   PMHX:   Past Medical History:  Diagnosis Date  . Anxiety   . Arthritis   . Carcinoma metastatic to lymph node ([place])   . Cataract    right  . COPD (chronic obstructive pulmonary disease) ([place])   . Depression   . GERD (gastroesophageal reflux disease)   . Hoarseness of voice   . Personal history of tobacco use, presenting hazards to health February of 2001  . Thyroid disease    Medication:    Current Outpatient Medications:  .  albuterol (PROVENTIL HFA;VENTOLIN HFA) 108 (90 Base) MCG/ACT inhaler, Inhale 2 puffs into the lungs every 4 (four) hours as needed for wheezing or shortness of breath., Disp: 1 Inhaler, Rfl: 3 .  albuterol (PROVENTIL) (2.5 MG/3ML) 0.083% nebulizer solution, Take 3 mLs (2.5 mg total) by  nebulization every 2 (two) hours as needed for wheezing. Dx: copd exacerbation J44.1, Disp: 100 vial, Rfl: 0 .  levothyroxine (SYNTHROID, LEVOTHROID) 88 MCG tablet, TAKE 1 TABLET BY MOUTH ONCE A DAY ON AN EMPTY STOMACH. (Patient taking differently: Take 88 mcg by mouth daily before breakfast. ), Disp: 90 tablet, Rfl: 1 .  meloxicam (MOBIC) 15 MG tablet, Take 1 tablet (15 mg total) by mouth daily., Disp: 30 tablet, Rfl: 2 .  Multiple Vitamins-Minerals (MULTIVITAMIN PO), Take 1 tablet by mouth daily. , Disp: , Rfl:  .  omeprazole (PRILOSEC) 20 MG capsule, Take 1 capsule (20 mg total) by mouth daily before  breakfast. (Patient taking differently: Take 20 mg by mouth daily as needed (heartburn). ), Disp: 90 capsule, Rfl: 1   Allergies:  Patient has no known allergies.      LABORATORY PANEL:   CBC No results for input(s): WBC, HGB, HCT, PLT in the last 168 hours. ------------------------------------------------------------------------------------------------------------------  Chemistries  No results for input(s): NA, K, CL, CO2, GLUCOSE, BUN, CREATININE, CALCIUM, MG, AST, ALT, ALKPHOS, BILITOT in the last 168 hours.  Invalid input(s): GFRCGP ------------------------------------------------------------------------------------------------------------------  Cardiac Enzymes No results for input(s): TROPONINI in the last 168 hours. ------------------------------------------------------------  RADIOLOGY:  No results found.     Thank  you for the consultation and for allowing [facility] to assist in the care of your patient. Our recommendations are noted above.  Please contact us if we can be of further service.   Marda Stalker, M.D., F.C.C.P.  Board Certified in Internal Medicine, Pulmonary Medicine, [place], and Sleep Medicine.  [facility] Office Number: [phone]   02/20/2018

## 2018-02-21 ENCOUNTER — Ambulatory Visit: Payer: BLUE CROSS/BLUE SHIELD | Admitting: Internal Medicine

## 2018-02-22 ENCOUNTER — Encounter: Payer: Self-pay | Admitting: Internal Medicine

## 2018-03-08 ENCOUNTER — Encounter: Payer: Self-pay | Admitting: Internal Medicine

## 2018-03-09 ENCOUNTER — Other Ambulatory Visit: Payer: Self-pay

## 2018-03-09 ENCOUNTER — Ambulatory Visit (HOSPITAL_COMMUNITY): Payer: BLUE CROSS/BLUE SHIELD | Admitting: Anesthesiology

## 2018-03-09 ENCOUNTER — Ambulatory Visit (HOSPITAL_COMMUNITY)
Admission: RE | Admit: 2018-03-09 | Discharge: 2018-03-09 | Disposition: A | Discharge status: Home / Self Care | Payer: BLUE CROSS/BLUE SHIELD | Attending: Gastroenterology | Admitting: Gastroenterology

## 2018-03-09 ENCOUNTER — Encounter (HOSPITAL_COMMUNITY)
Admission: RE | Disposition: A | Discharge status: Home / Self Care | Payer: Self-pay | Source: Home / Self Care | Attending: Gastroenterology

## 2018-03-09 ENCOUNTER — Encounter (HOSPITAL_COMMUNITY): Payer: Self-pay | Admitting: Gastroenterology

## 2018-03-09 DIAGNOSIS — K210 Gastro-esophageal reflux disease with esophagitis: Secondary | ICD-10-CM

## 2018-03-09 DIAGNOSIS — K228 Other specified diseases of esophagus: Secondary | ICD-10-CM

## 2018-03-09 DIAGNOSIS — I10 Essential (primary) hypertension: Secondary | ICD-10-CM | POA: Diagnosis not present

## 2018-03-09 DIAGNOSIS — F17210 Nicotine dependence, cigarettes, uncomplicated: Secondary | ICD-10-CM | POA: Insufficient documentation

## 2018-03-09 DIAGNOSIS — J449 Chronic obstructive pulmonary disease, unspecified: Secondary | ICD-10-CM | POA: Diagnosis not present

## 2018-03-09 DIAGNOSIS — K219 Gastro-esophageal reflux disease without esophagitis: Secondary | ICD-10-CM | POA: Insufficient documentation

## 2018-03-09 DIAGNOSIS — R1319 Other dysphagia: Secondary | ICD-10-CM

## 2018-03-09 DIAGNOSIS — R1310 Dysphagia, unspecified: Secondary | ICD-10-CM | POA: Diagnosis not present

## 2018-03-09 DIAGNOSIS — K229 Disease of esophagus, unspecified: Secondary | ICD-10-CM

## 2018-03-09 HISTORY — PX: EUS: SHX5427

## 2018-03-09 HISTORY — PX: ESOPHAGOGASTRODUODENOSCOPY: SHX5428

## 2018-03-09 HISTORY — PX: SUBMUCOSAL INJECTION: SHX5543

## 2018-03-09 SURGERY — UPPER ENDOSCOPIC ULTRASOUND (EUS) RADIAL
Anesthesia: Monitor Anesthesia Care

## 2018-03-09 MED ORDER — PROPOFOL 10 MG/ML IV BOLUS
INTRAVENOUS | Status: AC
  Filled 2018-03-09: qty 60

## 2018-03-09 MED ORDER — PROPOFOL 500 MG/50ML IV EMUL
INTRAVENOUS | Status: DC | PRN
  Administered 2018-03-09: 100 ug/kg/min via INTRAVENOUS

## 2018-03-09 MED ORDER — SODIUM CHLORIDE 0.9 % IV SOLN: INTRAVENOUS | Status: DC

## 2018-03-09 MED ORDER — ONABOTULINUMTOXINA 100 UNITS IJ SOLR
INTRAMUSCULAR | Status: AC
  Filled 2018-03-09: qty 100

## 2018-03-09 MED ORDER — PROPOFOL 10 MG/ML IV BOLUS
INTRAVENOUS | Status: DC | PRN
  Administered 2018-03-09: 20 mg via INTRAVENOUS
  Administered 2018-03-09: 30 mg via INTRAVENOUS
  Administered 2018-03-09: 20 mg via INTRAVENOUS

## 2018-03-09 MED ORDER — EPHEDRINE SULFATE-NACL 50-0.9 MG/10ML-% IV SOSY
PREFILLED_SYRINGE | INTRAVENOUS | Status: DC | PRN
  Administered 2018-03-09: 10 mg via INTRAVENOUS

## 2018-03-09 MED ORDER — SODIUM CHLORIDE (PF) 0.9 % IJ SOLN
INTRAMUSCULAR | Status: AC
  Filled 2018-03-09: qty 10

## 2018-03-09 MED ORDER — LACTATED RINGERS IV SOLN
INTRAVENOUS | Status: DC
  Administered 2018-03-09: 1000 mL via INTRAVENOUS

## 2018-03-09 MED ORDER — SODIUM CHLORIDE (PF) 0.9 % IJ SOLN
INTRAMUSCULAR | Status: DC | PRN
  Administered 2018-03-09: 09:00:00 via SUBMUCOSAL

## 2018-03-09 MED ORDER — LIDOCAINE 2% (20 MG/ML) 5 ML SYRINGE
INTRAMUSCULAR | Status: DC | PRN
  Administered 2018-03-09: 40 mg via INTRAVENOUS

## 2018-03-09 NOTE — Anesthesia Postprocedure Evaluation (Signed)
Anesthesia Post Note  Patient: Luke [name]  Procedure(s) Performed: UPPER ENDOSCOPIC ULTRASOUND (EUS) RADIAL (N/A ) SUBMUCOSAL INJECTION     Patient location during evaluation: PACU Anesthesia Type: MAC Level of consciousness: awake and alert Pain management: pain level controlled Vital Signs Assessment: post-procedure vital signs reviewed and stable Respiratory status: spontaneous breathing, nonlabored ventilation, respiratory function stable and patient connected to nasal cannula oxygen Cardiovascular status: stable and blood pressure returned to baseline Postop Assessment: no apparent nausea or vomiting Anesthetic complications: no    Last Vitals:  Vitals:   03/09/18 0900 03/09/18 0910  BP: 129/89 122/83  Pulse: 62 65  Resp: 14 12  Temp:    SpO2: 100% 96%    Last Pain:  Vitals:   03/09/18 0920  TempSrc:   PainSc: 0-No pain                 Tiajuana Amass

## 2018-03-09 NOTE — Discharge Instructions (Signed)
YOU HAD AN ENDOSCOPIC PROCEDURE TODAY: Refer to the procedure report and other information in the discharge instructions given to you for any specific questions about what was found during the examination. If this information does not answer your questions, please call [place] office at [phone] to clarify.   YOU SHOULD EXPECT: Some feelings of bloating in the abdomen. Passage of more gas than usual. Walking can help get rid of the air that was put into your GI tract during the procedure and reduce the bloating. If you had a lower endoscopy (such as a colonoscopy or flexible sigmoidoscopy) you may notice spotting of blood in your stool or on the toilet paper. Some abdominal soreness may be present for a day or two, also.  DIET: Your first meal following the procedure should be a light meal and then it is ok to progress to your normal diet. A half-sandwich or bowl of soup is an example of a good first meal. Heavy or fried foods are harder to digest and may make you feel nauseous or bloated. Drink plenty of fluids but you should avoid alcoholic beverages for 24 hours. If you had a esophageal dilation, please see attached instructions for diet.    ACTIVITY: Your care partner should take you home directly after the procedure. You should plan to take it easy, moving slowly for the rest of the day. You can resume normal activity the day after the procedure however YOU SHOULD NOT DRIVE, use power tools, machinery or perform tasks that involve climbing or major physical exertion for 24 hours (because of the sedation medicines used during the test).   SYMPTOMS TO REPORT IMMEDIATELY: A gastroenterologist can be reached at any hour. Please call [phone]  for any of the following symptoms:   Following upper endoscopy (EGD, EUS, ERCP, esophageal dilation) Vomiting of blood or coffee ground material  New, significant abdominal pain  New, significant chest pain or pain under the shoulder blades  Painful or  persistently difficult swallowing  New shortness of breath  Black, tarry-looking or red, bloody stools  FOLLOW UP:  If any biopsies were taken you will be contacted by phone or by letter within the next 1-3 weeks. Call [phone]  if you have not heard about the biopsies in 3 weeks.  Please also call with any specific questions about appointments or follow up tests.  

## 2018-03-09 NOTE — Transfer of Care (Signed)
Immediate Anesthesia Transfer of Care Note  Patient: Luke [name]  Procedure(s) Performed: UPPER ENDOSCOPIC ULTRASOUND (EUS) RADIAL (N/A ) SUBMUCOSAL INJECTION  Patient Location: PACU  Anesthesia Type:MAC  Level of Consciousness: sedated  Airway & Oxygen Therapy: Patient Spontanous Breathing and Patient connected to nasal cannula oxygen  Post-op Assessment: Report given to RN  Post vital signs: Reviewed, ephedrine given in PACU. VSS  Last Vitals:  Vitals Value Taken Time  BP 73/58 03/09/2018  8:51 AM  Temp    Pulse 55 03/09/2018  8:53 AM  Resp 15 03/09/2018  8:53 AM  SpO2 96 % 03/09/2018  8:53 AM  Vitals shown include unvalidated device data.  Last Pain:  Vitals:   03/09/18 0738  TempSrc: Oral  PainSc: 0-No pain         Complications: No apparent anesthesia complications

## 2018-03-09 NOTE — H&P (Signed)
HPI: This is a man with dysphagia, recent EGD and manometry suggested EG outflow obstruction, also two small submcucal distal esopahgus lesions  Chief complaint is dysphagia, chronically  ROS: complete GI ROS as described in HPI, all other review negative.  Constitutional:  No unintentional weight loss   Past Medical History:  Diagnosis Date  . Anxiety   . Arthritis   . Carcinoma metastatic to lymph node ([place])   . Cataract    right  . COPD (chronic obstructive pulmonary disease) ([place])   . Depression   . GERD (gastroesophageal reflux disease)   . Hoarseness of voice   . Personal history of tobacco use, presenting hazards to health February of 2001  . Thyroid disease     Past Surgical History:  Procedure Laterality Date  . BALLOON DILATION N/A 01/30/2018   Procedure: BALLOON DILATION;  Surgeon: Mauri Pole, MD;  Location: [facility] ENDOSCOPY;  Service: Endoscopy;  Laterality: N/A;  . COLONOSCOPY    . COLONOSCOPY WITH PROPOFOL N/A 11/29/2016   Procedure: COLONOSCOPY WITH PROPOFOL;  Surgeon: Manya Silvas, MD;  Location: [facility] ENDOSCOPY;  Service: Endoscopy;  Laterality: N/A;  . COLONOSCOPY WITH PROPOFOL N/A 08/22/2017   Procedure: COLONOSCOPY WITH PROPOFOL;  Surgeon: Jonathon Bellows, MD;  Location: [facility] ENDOSCOPY;  Service: Gastroenterology;  Laterality: N/A;  . ESOPHAGEAL MANOMETRY N/A 10/05/2017   Procedure: ESOPHAGEAL MANOMETRY (EM);  Surgeon: Jonathon Bellows, MD;  Location: [facility] ENDOSCOPY;  Service: Gastroenterology;  Laterality: N/A;  . ESOPHAGEAL MANOMETRY N/A 01/30/2018   Procedure: ESOPHAGEAL MANOMETRY (EM);  Surgeon: Mauri Pole, MD;  Location: [facility] ENDOSCOPY;  Service: Endoscopy;  Laterality: N/A;  . ESOPHAGOGASTRODUODENOSCOPY (EGD) WITH PROPOFOL N/A 11/29/2016   Procedure: ESOPHAGOGASTRODUODENOSCOPY (EGD) WITH PROPOFOL;  Surgeon: Manya Silvas, MD;  Location: [facility] ENDOSCOPY;  Service: Endoscopy;  Laterality: N/A;  . ESOPHAGOGASTRODUODENOSCOPY (EGD) WITH PROPOFOL  N/A 08/22/2017   Procedure: ESOPHAGOGASTRODUODENOSCOPY (EGD) WITH PROPOFOL;  Surgeon: Jonathon Bellows, MD;  Location: [facility] ENDOSCOPY;  Service: Gastroenterology;  Laterality: N/A;  . ESOPHAGOGASTRODUODENOSCOPY (EGD) WITH PROPOFOL N/A 01/30/2018   Procedure: ESOPHAGOGASTRODUODENOSCOPY (EGD) WITH PROPOFOL;  Surgeon: Mauri Pole, MD;  Location: [facility] ENDOSCOPY;  Service: Endoscopy;  Laterality: N/A;  Mano probe to be placed during EGD  . NECK SURGERY     status post diagnosis of cancer with chemo and radiation  . TONSILLECTOMY      Current Facility-Administered Medications  Medication Dose Route Frequency Provider Last Rate Last Dose  . lactated ringers infusion   Intravenous Continuous Milus Banister, MD        Allergies as of 02/02/2018  . (No Known Allergies)    Family History  Problem Relation Age of Onset  . Breast cancer Mother   . Heart disease Mother   . Cancer Mother        breast, back, bladder  . Heart attack Father   . Heart disease Father     Social History   Socioeconomic History  . Marital status: Single    Spouse name: Not on file  . Number of children: Not on file  . Years of education: [employer]  . Highest education level: High school graduate  Occupational History  . Not on file  Social Needs  . Financial resource strain: Not on file  . Food insecurity:    Worry: Not on file    Inability: Not on file  . Transportation needs:    Medical: Not on file    Non-medical: Not on file  Tobacco Use  .  Smoking status: Current Some Day Smoker    Packs/day: 0.25    Years: 40.00    Pack years: 10.00    Types: Cigarettes  . Smokeless tobacco: Current User  Substance and Sexual Activity  . Alcohol use: No  . Drug use: No  . Sexual activity: Not on file  Lifestyle  . Physical activity:    Days per week: Not on file    Minutes per session: Not on file  . Stress: Not on file  Relationships  . Social connections:    Talks on phone: Not on file    Gets  together: Not on file    Attends religious service: Not on file    Active member of club or organization: Not on file    Attends meetings of clubs or organizations: Not on file    Relationship status: Not on file  . Intimate partner violence:    Fear of current or ex partner: Not on file    Emotionally abused: Not on file    Physically abused: Not on file    Forced sexual activity: Not on file  Other Topics Concern  . Not on file  Social History Narrative  . Not on file     Physical Exam: There were no vitals taken for this visit. Constitutional: generally well-appearing Psychiatric: alert and oriented x3 Abdomen: soft, nontender, nondistended, no obvious ascites, no peritoneal signs, normal bowel sounds No peripheral edema noted in lower extremities  Assessment and plan: 67 y.o. male with chronic dysphagia, submucosal lesions at GE junction  Planning to interrogate the 2 subepithelial lesions at distal esophagus with EUS, likely biospy as well. His swallowing is no better after GE junction balloon dilation to 35mm.  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  Discussed with Dr. Silverio Decamp and pending EUS evaluation, will consider botox injection of GE junction to see how he responds.  Please see the "Patient Instructions" section for addition details about the plan.  [name] Loffler, MD [facility] 03/09/2018, 7:24 AM

## 2018-03-09 NOTE — Op Note (Signed)
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 Estimated blood loss:                            None. Estimated Blood Loss:     Estimated blood loss: none. Procedure:                Pre-Anesthesia Assessment:                           - Prior to the procedure, a History and Physical                            was performed, and patient medications and                            allergies were reviewed. The patient's tolerance of                            previous anesthesia was also reviewed. The risks                            and benefits of the procedure and the sedation                            options and risks were discussed with the patient.                            All questions were answered, and informed consent  was obtained. Prior Anticoagulants: The patient has                            taken no previous anticoagulant or antiplatelet                            agents. ASA Grade Assessment: II - A patient with                            mild systemic disease. After reviewing the risks                            and benefits, the patient was deemed in                            satisfactory  condition to undergo the procedure.                           After obtaining informed consent, the endoscope was                            passed under direct vision. Throughout the                            procedure, the patient's blood pressure, pulse, and                            oxygen saturations were monitored continuously. The                            [id number]-[id number] ([id number]) Olympus Radial EUS was                            introduced through the mouth, and advanced to the                            antrum of the stomach. The [id number]-[id number] ([id number])                            Olympus gastroscope was introduced through the                            mouth, and advanced to the second part of duodenum.                            The upper EUS was accomplished without difficulty.                            The patient tolerated the procedure well. Scope In: Scope Out: Findings:      ENDOSCOPIC FINDING: :      1. Single small soft subepithelial lesion in the distal esophagus was       noted. Approximately 4-2mm across, located 2-3cm proximal to the GE  junction. I could not find a second subepithelial lesion.      2. UES was widely patent and never closed, see images. I was able to       visualize well into the upper esophagus from the oropharynx.      3. Following EUS evaluation I injected 25 units Botox into 4 quadrants       at the [place] (100units total injection).      ENDOSONOGRAPHIC FINDING: :      1. The distal esophagus subepithelial lesion described above correlated       with an anechoic lesion, 5.68mm across, located in the deep mucosa layer       of the esophageal wall. This is consistent with a small duplication cyst       and it was not sampled during this examination.      2. Esophageal wall was otherwise normal by endosonography.      3. Limited views of the pancreas, liver, spleen were all normal. Impression:               1. Single, small soft  subepithelial lesion in the                            distal esophagus was noted. This measured 5.41mm                            across by EUS and was consistent with a small                            duplication cyst that is unlikely of any clinical                            significance. I could not find a second                            subepithelial lesion in the distal esophagus.                           2. UES was widely patent and never closed, see                            images. I was able to visualize well into the upper                            esophagus from the oropharynx (see images). I                            suspect this is due to remote HN radiation and it                            possibly explains why he has regurgitation of                            food/fluid whenever he bends over.  3. Following EUS evaluation I injected 25 units                            Botox into 4 quadrants at the [place] (100units                            total injection). Moderate Sedation:      Not Applicable - Patient had care per Anesthesia. Recommendation:           - He knows to call the office to report on his                            response to the Botox injection in 3-4 weeks. Procedure Code(s):        --- Professional ---                           [phone], Esophagogastroduodenoscopy, flexible,                            transoral; with endoscopic ultrasound examination                            limited to the esophagus, stomach or duodenum, and                            adjacent structures Diagnosis Code(s):        --- Professional ---                           K21.0, Gastro-esophageal reflux disease with                            esophagitis                           K22.8, Other specified diseases of esophagus CPT copyright 2018 American Medical Association. All rights reserved. The codes documented in this report are preliminary  and upon coder review may  be revised to meet current compliance requirements. Milus Banister, MD 03/09/2018 9:07:49 AM This report has been signed electronically. Number of Addenda: 0

## 2018-03-09 NOTE — Anesthesia Preprocedure Evaluation (Signed)
Anesthesia Evaluation  Patient identified by MRN, date of birth, ID band Patient awake    Reviewed: Allergy & Precautions, NPO status , Patient's Chart, lab work & pertinent test results  Airway Mallampati: I  TM Distance: >3 FB Neck ROM: Full    Dental   Pulmonary COPD,  COPD inhaler, Current Smoker,    Pulmonary exam normal        Cardiovascular hypertension, Pt. on medications Normal cardiovascular exam     Neuro/Psych Anxiety Depression    GI/Hepatic Neg liver ROS, GERD  Medicated and Controlled,  Endo/Other  Hypothyroidism   Renal/GU negative Renal ROS     Musculoskeletal  (+) Arthritis ,   Abdominal   Peds  Hematology   Anesthesia Other Findings   Reproductive/Obstetrics                             Anesthesia Physical  Anesthesia Plan  ASA: III  Anesthesia Plan: MAC   Post-op Pain Management:    Induction: Intravenous  PONV Risk Score and Plan: 0  Airway Management Planned: Natural Airway and Nasal Cannula  Additional Equipment:   Intra-op Plan:   Post-operative Plan:   Informed Consent: I have reviewed the patients History and Physical, chart, labs and discussed the procedure including the risks, benefits and alternatives for the proposed anesthesia with the patient or authorized representative who has indicated his/her understanding and acceptance.       Plan Discussed with: CRNA and Surgeon  Anesthesia Plan Comments:         Anesthesia Quick Evaluation

## 2018-03-09 NOTE — Anesthesia Procedure Notes (Signed)
Date/Time: 03/09/2018 8:09 AM Performed by: Talbot Grumbling, CRNA Oxygen Delivery Method: Nasal cannula

## 2018-03-10 ENCOUNTER — Encounter (HOSPITAL_COMMUNITY): Payer: Self-pay | Admitting: Gastroenterology

## 2018-03-13 ENCOUNTER — Other Ambulatory Visit: Payer: Self-pay

## 2018-03-17 ENCOUNTER — Encounter: Payer: Self-pay | Admitting: Family Medicine

## 2018-03-17 ENCOUNTER — Other Ambulatory Visit: Payer: Self-pay

## 2018-03-17 ENCOUNTER — Ambulatory Visit (INDEPENDENT_AMBULATORY_CARE_PROVIDER_SITE_OTHER): Payer: BLUE CROSS/BLUE SHIELD | Admitting: Family Medicine

## 2018-03-17 VITALS — BP 117/81 | HR 74 | Temp 98.5°F | Resp 16 | Ht 73.0 in | Wt 192.2 lb

## 2018-03-17 DIAGNOSIS — M7551 Bursitis of right shoulder: Secondary | ICD-10-CM

## 2018-03-17 DIAGNOSIS — J432 Centrilobular emphysema: Secondary | ICD-10-CM | POA: Diagnosis not present

## 2018-03-17 DIAGNOSIS — S4401XA Injury of ulnar nerve at upper arm level, right arm, initial encounter: Secondary | ICD-10-CM | POA: Diagnosis not present

## 2018-03-17 DIAGNOSIS — J019 Acute sinusitis, unspecified: Secondary | ICD-10-CM | POA: Diagnosis not present

## 2018-03-17 MED ORDER — FLUTICASONE PROPIONATE 50 MCG/ACT NA SUSP: 2.0000 | Freq: Every day | NASAL | 3 refills | Status: DC

## 2018-03-17 MED ORDER — ALBUTEROL SULFATE HFA 108 (90 BASE) MCG/ACT IN AERS: 2.0000 | INHALATION_SPRAY | RESPIRATORY_TRACT | 3 refills | Status: DC | PRN

## 2018-03-17 MED ORDER — CYCLOBENZAPRINE HCL 10 MG PO TABS: 5.0000 mg | ORAL_TABLET | Freq: Every evening | ORAL | 0 refills | Status: DC | PRN

## 2018-03-17 MED ORDER — PREDNISONE 50 MG PO TABS: 50.0000 mg | ORAL_TABLET | Freq: Every day | ORAL | 0 refills | Status: DC

## 2018-03-17 NOTE — Patient Instructions (Addendum)
Thank you for coming to the office today.  For R shoulder likely bursitis or strain and it is inflamed  - Treat with steroid Prednisone for shoulder and nerve pinched - one pill with food daily for 6 days - this can help your breathing and sinus as well  Start Cyclobenzapine (Flexeril) 10mg  tablets (muscle relaxant) - start with half (cut) to one whole pill at night for muscle relaxant - may make you sedated or sleepy (be careful driving or working on this) if tolerated you can take half to whole tab 2 to 3 times daily or every 8 hours as needed  Start nasal steroid Flonase 2 sprays in each nostril daily for 4-6 weeks, may repeat course seasonally or as needed - to help prevent future problem  If not improving shoulder can return for X-ray - call and we can order  If not improving sinus or sign of infection can return or contact us for antibiotic  Please schedule a Follow-up Appointment to: Return in about 6 weeks (around 04/28/2018) for PreDM A1c, R shoulder pain.  If you have any other questions or concerns, please feel free to call the office or send a message through [place]. You may also schedule an earlier appointment if necessary.  Additionally, you may be receiving a survey about your experience at our office within a few days to 1 week by e-mail or mail. We value your feedback.  Nobie Putnam, DO [place]

## 2018-03-17 NOTE — Progress Notes (Addendum)
Subjective:    Patient ID: Luke [name], male    DOB: 04-Apr-1950, 68 y.o.   MRN: [id number]  Luke [name] is a 68 y.o. male presenting on 03/17/2018 for Shoulder Pain (Right side onset 10 days (picked up the TV to throw in dumpster) ) and chest congestion (getting worst from past week, runny nose denies chills or fever)   HPI   Right Shoulder, Pain Reports symptoms started 10 days ago provoked by with lifting a heavy old 50-60 lb TV and threw it in a dumpster he injured and strained his R shoulder. - Today he has had persistent R shoulder pain, worse provoked if lifting arm above shoulder. Admits shoulder pain wakes him up and causes radiating pain into arm, and hand, with numbness and tingling, will have sensation of numbness sometime when he wakes up - No prior R shoulder injury, surgery, or imaging procedure Denies other joint pain, erythema, fever chill, edema   Sinusitis Reports recent course with past >1 week or more with sinus congestion, pressure, drainage, post nasal. Seems to cause difficulty with cough and his swallowing problem. Denies fever chills sweats  PMH - Centrilobular Emphysema COPD   Depression screen [facility] 2/9 03/17/2018 09/01/2017 07/08/2017  Decreased Interest 0 0 0  Down, Depressed, Hopeless 0 0 0  PHQ - 2 Score 0 0 0    Social History   Tobacco Use  . Smoking status: Current Some Day Smoker    Packs/day: 0.25    Years: 40.00    Pack years: 10.00    Types: Cigarettes  . Smokeless tobacco: Current User  Substance Use Topics  . Alcohol use: No  . Drug use: No    Review of Systems Per HPI unless specifically indicated above     Objective:    BP 117/81   Pulse 74   Temp 98.5 F (36.9 C) (Oral)   Resp 16   Ht 6\' 1"  (1.854 m)   Wt 192 lb 3.2 oz (87.2 kg)   SpO2 98%   BMI 25.36 kg/m   Wt Readings from Last 3 Encounters:  03/17/18 192 lb 3.2 oz (87.2 kg)  03/09/18 195 lb (88.5 kg)  01/30/18 195 lb (88.5 kg)    Physical Exam Vitals signs  and nursing note reviewed.  Constitutional:      General: He is not in acute distress.    Appearance: He is well-developed. He is not diaphoretic.     Comments: Well-appearing, comfortable, cooperative  HENT:     Head: Normocephalic and atraumatic.     Comments: Frontal / maxillary sinuses non-tender. Nares with turbinate edema and congestion without purulence. Bilateral TMs clear with mild effusion R>L, without erythema or bulging. Oropharynx with post nasal drainage and general erythema without exudates, edema or asymmetry. Eyes:     General:        Right eye: No discharge.        Left eye: No discharge.     Conjunctiva/sclera: Conjunctivae normal.  Neck:     Musculoskeletal: Normal range of motion and neck supple.     Thyroid: No thyromegaly.  Cardiovascular:     Rate and Rhythm: Normal rate and regular rhythm.     Heart sounds: Normal heart sounds. No murmur.  Pulmonary:     Effort: Pulmonary effort is normal. No respiratory distress.     Breath sounds: Wheezing and rhonchi present. No rales.     Comments: Reduced air movement, similar to baseline Musculoskeletal:  Comments: RIGHT Shoulder Inspection: Normal appearance bilateral symmetrical Palpation: mild tender to palpation over anterior ROM: Significantly reduced active ROM forward flexion and abduction, but intact internal rotation behind back Special Testing: Rotator cuff testing negative for weakness with supraspinatus full can = but pain and some weak on empty can test, Hawkin's AC impingement positive for pain Strength: Normal strength 5/5 flex/ext, ext rot / int rot, grip, rotator cuff str testing. Neurovascular: Distally intact pulses, sensation to light touch   Lymphadenopathy:     Cervical: No cervical adenopathy.  Skin:    General: Skin is warm and dry.     Findings: No erythema or rash.  Neurological:     Mental Status: He is alert and oriented to person, place, and time.     Comments: Negative Tinel's at  elbow  Psychiatric:        Behavior: Behavior normal.     Comments: Well groomed, good eye contact, normal speech and thoughts    Results for orders placed or performed in visit on 12/06/17  POCT HgB A1C  Result Value Ref Range   Hemoglobin A1C 6.1 (A) 4.0 - 5.6 %      Assessment & Plan:   Problem List Items Addressed This Visit    COPD (chronic obstructive pulmonary disease) ([place]) Early symptoms possible developing AECOPD Seems initial only sinusitis now, not bacterial  Treat other issues with Prednisone, may help improve his current breathing as well treat any mild residual COPD    Relevant Medications   predniSONE (DELTASONE) 50 MG tablet   albuterol (PROVENTIL HFA;VENTOLIN HFA) 108 (90 Base) MCG/ACT inhaler   fluticasone (FLONASE) 50 MCG/ACT nasal spray    Other Visit Diagnoses    Acute bursitis of right shoulder    -  Primary  Consistent with acute R-shoulder bursitis vs rotator cuff tendinopathy with some reduced active ROM but without significant evidence of muscle tear (no weakness).  - Secondary to acute injury strain lifting injury - no prior imaging or dx arthritis in shoulder, has OADJD other joint.  Plan: 1. Start Prednisone 50mg  daily x 6 days - also for nerve impingement pain and for COPD breathing 2. May take Tylenol Ex Str 1-2 q 6 hr PRN - Trial on Flexeril PRN QHS caution sedation use at night help sleep 3. Relative rest but keep shoulder mobile, demonstrated ROM exercises, avoid heavy lifting 4. May try heating pad PRN 5. Follow-up 4-6 weeks if not improved for re-evaluation, consider referral to Physical Therapy, X-rays, and or subacromial steroid injection  May return in 1 week if need R Shoulder X-ray if not improve due to mechanism of injury would offer this if interested    Relevant Medications   predniSONE (DELTASONE) 50 MG tablet   cyclobenzaprine (FLEXERIL) 10 MG tablet   Acute rhinosinusitis      Consistent with acute maxillary  rhinosinusitis, likely initially viral URI allergic rhinitis component with improvement now with persistent sinus congestion without evidence of acute bacterial infection.  Plan: 1. Reassurance, likely self-limited - no indication for antibiotics at this time 2. Start  Flonase 2 sprays in each nostril daily for next 4-6 weeks, then may stop and use seasonally or as needed Return criteria reviewed     Relevant Medications   predniSONE (DELTASONE) 50 MG tablet   fluticasone (FLONASE) 50 MCG/ACT nasal spray   Injury of right ulnar nerve at upper arm level, initial encounter     See above A&P LIkely given neuropathic and nerve compressive symptoms,  likely related nerve injury ulnar aspect upper arm vs elbow from this injury - trial on steroid Follow-up - consider neuro vs ortho if not improve    Relevant Medications   predniSONE (DELTASONE) 50 MG tablet   cyclobenzaprine (FLEXERIL) 10 MG tablet      Meds ordered this encounter  Medications  . predniSONE (DELTASONE) 50 MG tablet    Sig: Take 1 tablet (50 mg total) by mouth daily with breakfast.    Dispense:  6 tablet    Refill:  0  . albuterol (PROVENTIL HFA;VENTOLIN HFA) 108 (90 Base) MCG/ACT inhaler    Sig: Inhale 2 puffs into the lungs every 4 (four) hours as needed for wheezing or shortness of breath.    Dispense:  1 Inhaler    Refill:  3  . cyclobenzaprine (FLEXERIL) 10 MG tablet    Sig: Take 0.5-1 tablets (5-10 mg total) by mouth at bedtime as needed for muscle spasms.    Dispense:  30 tablet    Refill:  0  . fluticasone (FLONASE) 50 MCG/ACT nasal spray    Sig: Place 2 sprays into both nostrils daily. Use for 4-6 weeks then stop and use seasonally or as needed.    Dispense:  16 g    Refill:  3     Follow up plan: Return in about 6 weeks (around 04/28/2018) for PreDM A1c, R shoulder pain.  Future may call in 1 week if need R shoulder X-ray, no office visit  Nobie Putnam, [facility] 03/17/2018, 3:46 PM

## 2018-03-29 ENCOUNTER — Ambulatory Visit (INDEPENDENT_AMBULATORY_CARE_PROVIDER_SITE_OTHER): Payer: BLUE CROSS/BLUE SHIELD | Admitting: Family Medicine

## 2018-03-29 ENCOUNTER — Telehealth: Payer: Self-pay

## 2018-03-29 ENCOUNTER — Encounter: Payer: Self-pay | Admitting: Family Medicine

## 2018-03-29 ENCOUNTER — Other Ambulatory Visit: Payer: Self-pay

## 2018-03-29 VITALS — BP 90/62 | HR 86 | Temp 98.7°F | Resp 16 | Ht 73.0 in | Wt 189.6 lb

## 2018-03-29 DIAGNOSIS — J432 Centrilobular emphysema: Secondary | ICD-10-CM | POA: Diagnosis not present

## 2018-03-29 DIAGNOSIS — J441 Chronic obstructive pulmonary disease with (acute) exacerbation: Secondary | ICD-10-CM | POA: Diagnosis not present

## 2018-03-29 MED ORDER — LEVOFLOXACIN 500 MG PO TABS: 500.0000 mg | ORAL_TABLET | Freq: Every day | ORAL | 0 refills | Status: DC

## 2018-03-29 MED ORDER — UMECLIDINIUM-VILANTEROL 62.5-25 MCG/INH IN AEPB: 1.0000 | INHALATION_SPRAY | Freq: Every day | RESPIRATORY_TRACT | 0 refills | Status: DC

## 2018-03-29 MED ORDER — PREDNISONE 20 MG PO TABS: ORAL_TABLET | ORAL | 0 refills | Status: DC

## 2018-03-29 NOTE — Patient Instructions (Addendum)
Thank you for coming to the office today.  1. It sounds like you had an Upper Respiratory Virus that has settled into a Bronchitis, lower respiratory tract infection. I don't have concerns for pneumonia today, and think that this should gradually improve. Once you are feeling better, the cough may take a few weeks to fully resolve. I do hear wheezing and coarse breath sounds, this may be due to the virus, also could be related to smoking.  - Start taking Levaquin antibiotic 500mg  daily x 7 days - Start prednisone taper - Start new every day inhaler Anoro - Use Albuterol inhaler 2 puffs every 4-6 hours around the clock for next 2-3 days, max up to 5 days then use as needed   - Use nasal saline (Simply Saline or Ocean Spray) to flush nasal congestion multiple times a day, may help cough - Drink plenty of fluids to improve congestion  If your symptoms seem to worsen instead of improve over next several days, including significant fever / chills, worsening shortness of breath, worsening wheezing, or nausea / vomiting and can't take medicines - return sooner or go to hospital Emergency Department for more immediate treatment.  Call lung doctors to re-schedule a follow-up - last time saw them in July 2019 - need to get back on track for lungs now, and may benefit from other daily inhaler  [facility] [address] [place], [place] Phone: [phone]  Laverle Hobby, MD  ------  Please schedule a Follow-up Appointment to: Return in about 1 week (around 04/05/2018), or if symptoms worsen or fail to improve, for COPD.  If you have any other questions or concerns, please feel free to call the office or send a message through [place]. You may also schedule an earlier appointment if necessary.  Additionally, you may be receiving a survey about your experience at our office within a few days to 1 week by e-mail or mail. We value your  feedback.  Nobie Putnam, DO [place]

## 2018-03-29 NOTE — Telephone Encounter (Signed)
Patient call regarding his reoccurrence of symptom after finishing steroid. His symptoms are nasal and chest congestion, getting cough after eating, getting greenish mucus and getting greenish nasal discharge in the morning while blowing his nose, has Hx of SOB but denies fever. Patient is asking another round of steroid. Advised patient to schedule an appointment.

## 2018-03-29 NOTE — Telephone Encounter (Signed)
Triaged and asked to come in for office visit due to COPD exacerbation as likely diagnosis, and he is afebrile.  See office visit note from today 03/29/18 for full documentation now.  Nobie Putnam, [facility] 03/29/2018, 1:08 PM

## 2018-03-29 NOTE — Progress Notes (Signed)
Subjective:    Patient ID: YVONNE PETITE, male    DOB: [birth year], 68 y.o.   MRN: [id number]  Luke [name] is a 68 y.o. male presenting on 03/29/2018 for Nasal Congestion (chest congestion, SOB onset week) and COPD  Patient presents for a same day appointment.  HPI   Centrilobular Emphysema (COPD) Exacerbation / Sinusitis - Last visit with me 03/17/18, for other issues including shoulder pain bursitis but also seen for COPD and sinusitis, treated with prednisone burst for joint pain and mild copd symptoms, see prior notes for background information. - Interval update with improvement temporarily on prednisone, then seemed worsening after finished now 2 weeks later - Today patient reports describes some worsening chest congestion and dyspnea episodic worsening, similar problems that he has had for months now, associated with underlying COPD and his dysphagia, still admits some episodes of aspiration - Admits sinus congestion Using Flonase regularly for drainage with improvement Using albuterol 2-3 times a week, more often if strenuous activity Never on maintenance inhaler Previously saw Dr [facility] 07/2017, has not returned Denies any high risk travel or exposure to any potential COVID19 case Denies fever, chills, body aches, nausea vomiting, headache  Health Maintenance: UTD Flu Vaccine 10/2016  Depression screen [facility] 2/9 03/29/2018 03/17/2018 09/01/2017  Decreased Interest 0 0 0  Down, Depressed, Hopeless 0 0 0  PHQ - 2 Score 0 0 0    Social History   Tobacco Use  . Smoking status: Current Some Day Smoker    Packs/day: 0.25    Years: 40.00    Pack years: 10.00    Types: Cigarettes  . Smokeless tobacco: Current User  Substance Use Topics  . Alcohol use: No  . Drug use: No    Review of Systems Per HPI unless specifically indicated above     Objective:    BP 90/62   Pulse 86   Temp 98.7 F (37.1 C) (Oral)   Resp 16   Ht 6\' 1"  (1.854 m)   Wt 189 lb 9.6 oz (86  kg)   SpO2 97%   BMI 25.01 kg/m   Wt Readings from Last 3 Encounters:  03/29/18 189 lb 9.6 oz (86 kg)  03/17/18 192 lb 3.2 oz (87.2 kg)  03/09/18 195 lb (88.5 kg)    Physical Exam Vitals signs and nursing note reviewed.  Constitutional:      General: He is not in acute distress.    Appearance: He is well-developed. He is not diaphoretic.     Comments: Well-appearing, comfortable, cooperative  HENT:     Head: Normocephalic and atraumatic.  Eyes:     General:        Right eye: No discharge.        Left eye: No discharge.     Conjunctiva/sclera: Conjunctivae normal.  Neck:     Musculoskeletal: Normal range of motion and neck supple.     Thyroid: No thyromegaly.  Cardiovascular:     Rate and Rhythm: Normal rate and regular rhythm.     Heart sounds: Normal heart sounds. No murmur.  Pulmonary:     Effort: Pulmonary effort is normal. No respiratory distress.     Breath sounds: Wheezing and rhonchi present. No rales.     Comments: Mild increased generalized wheezing and coarse breath sounds, non focal. Rare coughing. Musculoskeletal: Normal range of motion.  Lymphadenopathy:     Cervical: No cervical adenopathy.  Skin:    General: Skin is warm and dry.  Findings: No erythema or rash.  Neurological:     Mental Status: He is alert and oriented to person, place, and time.  Psychiatric:        Behavior: Behavior normal.     Comments: Well groomed, good eye contact, normal speech and thoughts        Assessment & Plan:   Problem List Items Addressed This Visit    Centrilobular emphysema ([place]) - Primary   Relevant Medications   predniSONE (DELTASONE) 20 MG tablet   umeclidinium-vilanterol (ANORO ELLIPTA) 62.5-25 MCG/INH AEPB    Other Visit Diagnoses    COPD with acute exacerbation ([place])       Relevant Medications   levofloxacin (LEVAQUIN) 500 MG tablet   predniSONE (DELTASONE) 20 MG tablet   umeclidinium-vilanterol (ANORO ELLIPTA) 62.5-25 MCG/INH AEPB       Consistent with mild recurrent vs developing acute exacerbation of COPD with worsening productive cough. Similar to prior exacerbations. - No hypoxia (97% on RA), afebrile, no recent hospitalization  Plan: 1. Start Prednisone burst taper over 7 days 2. Empiric Start Levaquin antibiotic 500mg  daily x 7 days 3. Use albuterol q 4 hr regularly x 2-3 days - START sample Anoro inhaler - trial - may need maintenance therapy in future  Continue w/ GI follow-up for dysphagia as planned  Advised he should return to Pulmonology if not improving - may contact [facility]  Follow-up about 1 week if not improving, otherwise strict return criteria to go to ED  Strict return precautions if develop fever or new concerns for possible COVID19 - advised contact phone triage / ED follow if need acutely    Meds ordered this encounter  Medications  . levofloxacin (LEVAQUIN) 500 MG tablet    Sig: Take 1 tablet (500 mg total) by mouth daily. For 7 days    Dispense:  7 tablet    Refill:  0  . predniSONE (DELTASONE) 20 MG tablet    Sig: Take daily with food. Start with 60mg  (3 pills) x 2 days, then reduce to 40mg  (2 pills) x 2 days, then 20mg  (1 pill) x 3 days    Dispense:  13 tablet    Refill:  0  . umeclidinium-vilanterol (ANORO ELLIPTA) 62.5-25 MCG/INH AEPB    Sig: Inhale 1 puff into the lungs daily.    Dispense:  7 each    Refill:  0     Follow up plan: Return in about 1 week (around 04/05/2018), or if symptoms worsen or fail to improve, for COPD.   Luke [name], [facility] 03/29/2018, 12:17 PM

## 2018-04-04 ENCOUNTER — Other Ambulatory Visit: Payer: Self-pay

## 2018-04-04 ENCOUNTER — Ambulatory Visit (INDEPENDENT_AMBULATORY_CARE_PROVIDER_SITE_OTHER): Payer: BLUE CROSS/BLUE SHIELD | Admitting: Internal Medicine

## 2018-04-04 ENCOUNTER — Telehealth: Payer: Self-pay | Admitting: Internal Medicine

## 2018-04-04 DIAGNOSIS — J209 Acute bronchitis, unspecified: Secondary | ICD-10-CM

## 2018-04-04 DIAGNOSIS — J440 Chronic obstructive pulmonary disease with acute lower respiratory infection: Secondary | ICD-10-CM

## 2018-04-04 DIAGNOSIS — F17210 Nicotine dependence, cigarettes, uncomplicated: Secondary | ICD-10-CM

## 2018-04-04 DIAGNOSIS — J449 Chronic obstructive pulmonary disease, unspecified: Secondary | ICD-10-CM

## 2018-04-04 MED ORDER — IPRATROPIUM-ALBUTEROL 0.5-2.5 (3) MG/3ML IN SOLN: 3.0000 mL | Freq: Three times a day (TID) | RESPIRATORY_TRACT | 3 refills | Status: DC

## 2018-04-04 MED ORDER — TIOTROPIUM BROMIDE MONOHYDRATE 18 MCG IN CAPS: 18.0000 ug | ORAL_CAPSULE | Freq: Every day | RESPIRATORY_TRACT | 2 refills | Status: DC

## 2018-04-04 MED ORDER — NICOTINE 21 MG/24HR TD PT24: 21.0000 mg | MEDICATED_PATCH | TRANSDERMAL | 1 refills | Status: DC

## 2018-04-04 NOTE — Telephone Encounter (Signed)
Called and spoke to pt.  Pt stated that he was seen by PCP on 03/29/18 and was prescribed Levaquin and prednisone.  Pt states symptoms have mildly improved with abx and prednisone, but are still present.  Pt completed Levaquin yesterday and will complete prednisone today.  Pt reports of sob with exertion, voice hoariness & prod cough with clear mucus. Denied fever, chills or sweats.  Pt denied travel but is unsure if he has had sick contact, as he works at an apartment complex.  Pt has been scheduled for phone visit with DR today, due to covid-19 concerns.  Nothing further is needed.

## 2018-04-04 NOTE — Progress Notes (Signed)
[facility] Consultation      Virtual Visit via Telephone Note I connected with@ on 04/04/18 at  9:30 AM EDT by telephone and verified that I am speaking with the correct person using two identifiers.   I discussed the limitations, risks, security and privacy concerns of performing an evaluation and management service by telephone and the availability of in person appointments. I also discussed with the patient that there may be a patient responsible charge related to this service. The patient expressed understanding and agreed to proceed. I discussed the assessment and treatment plan with the patient. The patient was provided an opportunity to ask questions and all were answered. The patient agreed with the plan and demonstrated an understanding of the instructions. Please see note below for further detail.    The patient was advised to call back or seek an in-person evaluation if the symptoms worsen or if the condition fails to improve as anticipated.  I provided 25 minutes of non-face-to-face time during this encounter.   Laverle Hobby, MD   Assessment and Plan:  COPD/emphysema with acute exacerbation of COPD, acute bronchitis. -Use the nebulizer three times per day until you are feeling better.  --Start Spiriva once daily. - Patient has been enrolled in lung cancer screening.  Aspiration pneumonia/GERD/dysphagia. - Patient has difficulty with swallowing, and often regurgitates foods, which may be contributing to aspiration. - Should follow-up with gastroenterology.  Nicotine abuse. - Encouraged continued smoking cessation. - Discussed importance of motivation, spent 3 minutes in discussion.  Also prescription for nicotine patches.  Meds ordered this encounter  Medications  . tiotropium (SPIRIVA HANDIHALER) 18 MCG inhalation capsule    Sig: Place 1 capsule (18 mcg total) into inhaler and inhale daily.    Dispense:  30 capsule    Refill:  2  .  ipratropium-albuterol (DUONEB) 0.5-2.5 (3) MG/3ML SOLN    Sig: Take 3 mLs by nebulization 3 (three) times daily. USE THREE TIMES PER DAY UNTIL YOU FEEL BETTER.    Dispense:  360 mL    Refill:  3  . nicotine (NICODERM CQ - DOSED IN MG/24 HOURS) 21 mg/24hr patch    Sig: Place 1 patch (21 mg total) onto the skin daily.    Dispense:  30 patch    Refill:  1    Return in about 3 months (around 07/05/2018).   Date: 04/04/2018  MRN# [id number] Luke [name] 1950/12/04    Luke [name] is a 68 y.o. old male seen in consultation for chief complaint of: DYSPNEA.      HPI:   Luke [name] is a 68 y.o. male with a history of anxiety, arthritis, metastatic carcinoma, COPD, depression, GERD.  He was admitted to the hospital from 6/20 to 07/03/17 with acute exacerbation of COPD, aspiration pneumonia, and continued smoking.  Last visit was noted the patient has a history of a COPD exacerbation, he has proair at home which he has only been using as needed. He is now using them once per day.  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  He recently started feeling more short of breath, on 3/18 he was seen by his PCP and given a prescription for Levaquin and steroids. He feels that they have helped, he is "a whole lot better that I was". He does not have conversational dyspnea.  He has a nebulizer at home but he is running out.  Patient is currently smoking about half a pack a day.  He  is interested in quitting.  He takes omeprazole daily.  **CT chest 04/12/2016, chest x-ray 07/01/2017>> imaging personally reviewed, changes of chronic bronchitis/COPD. **CBC 02/13/2016>> absolute eosinophil count 200  Social Hx:   Social History   Tobacco Use  . Smoking status: Current Some Day Smoker    Packs/day: 0.25    Years: 40.00    Pack years: 10.00    Types: Cigarettes  . Smokeless tobacco: Current User  Substance Use Topics  . Alcohol use: No  . Drug use: No   Medication:    Current  Outpatient Medications:  .  albuterol (PROVENTIL HFA;VENTOLIN HFA) 108 (90 Base) MCG/ACT inhaler, Inhale 2 puffs into the lungs every 4 (four) hours as needed for wheezing or shortness of breath., Disp: 1 Inhaler, Rfl: 3 .  albuterol (PROVENTIL) (2.5 MG/3ML) 0.083% nebulizer solution, Take 3 mLs (2.5 mg total) by nebulization every 2 (two) hours as needed for wheezing. Dx: copd exacerbation J44.1, Disp: 100 vial, Rfl: 0 .  cyclobenzaprine (FLEXERIL) 10 MG tablet, Take 0.5-1 tablets (5-10 mg total) by mouth at bedtime as needed for muscle spasms., Disp: 30 tablet, Rfl: 0 .  fluticasone (FLONASE) 50 MCG/ACT nasal spray, Place 2 sprays into both nostrils daily. Use for 4-6 weeks then stop and use seasonally or as needed., Disp: 16 g, Rfl: 3 .  levofloxacin (LEVAQUIN) 500 MG tablet, Take 1 tablet (500 mg total) by mouth daily. For 7 days, Disp: 7 tablet, Rfl: 0 .  levothyroxine (SYNTHROID, LEVOTHROID) 88 MCG tablet, TAKE 1 TABLET BY MOUTH ONCE A DAY ON AN EMPTY STOMACH. (Patient taking differently: Take 88 mcg by mouth daily before breakfast. ), Disp: 90 tablet, Rfl: 1 .  meloxicam (MOBIC) 15 MG tablet, Take 1 tablet (15 mg total) by mouth daily., Disp: 30 tablet, Rfl: 2 .  Multiple Vitamin (MULTIVITAMIN WITH MINERALS) TABS tablet, Take 1 tablet by mouth daily. One-A-Day Multivitamin, Disp: , Rfl:  .  omeprazole (PRILOSEC) 20 MG capsule, Take 1 capsule (20 mg total) by mouth daily before breakfast. (Patient taking differently: Take 20 mg by mouth daily before lunch. ), Disp: 90 capsule, Rfl: 1 .  predniSONE (DELTASONE) 20 MG tablet, Take daily with food. Start with 60mg  (3 pills) x 2 days, then reduce to 40mg  (2 pills) x 2 days, then 20mg  (1 pill) x 3 days, Disp: 13 tablet, Rfl: 0 .  umeclidinium-vilanterol (ANORO ELLIPTA) 62.5-25 MCG/INH AEPB, Inhale 1 puff into the lungs daily., Disp: 7 each, Rfl: 0   Allergies:  Patient has no known allergies.    LABORATORY PANEL:   CBC No results for input(s):  WBC, HGB, HCT, PLT in the last 168 hours. ------------------------------------------------------------------------------------------------------------------  Chemistries  No results for input(s): NA, K, CL, CO2, GLUCOSE, BUN, CREATININE, CALCIUM, MG, AST, ALT, ALKPHOS, BILITOT in the last 168 hours.  Invalid input(s): GFRCGP ------------------------------------------------------------------------------------------------------------------  Cardiac Enzymes No results for input(s): TROPONINI in the last 168 hours. ------------------------------------------------------------  RADIOLOGY:  No results found.     Thank  you for the consultation and for allowing [facility] to assist in the care of your patient. Our recommendations are noted above.  Please contact us if we can be of further service.   Marda Stalker, M.D., F.C.C.P.  Board Certified in Internal Medicine, Pulmonary Medicine, [place], and Sleep Medicine.  [facility] Office Number: [phone]   04/04/2018

## 2018-04-12 ENCOUNTER — Telehealth: Payer: Self-pay | Admitting: *Deleted

## 2018-04-12 NOTE — Telephone Encounter (Signed)
PT REFUSED WEBEX APPOINTMENT  R/S FOR 06/09/2018 AT 10:15AM

## 2018-04-14 ENCOUNTER — Encounter: Payer: Self-pay | Admitting: Family Medicine

## 2018-04-14 ENCOUNTER — Ambulatory Visit (INDEPENDENT_AMBULATORY_CARE_PROVIDER_SITE_OTHER): Payer: BLUE CROSS/BLUE SHIELD | Admitting: Family Medicine

## 2018-04-14 ENCOUNTER — Other Ambulatory Visit: Payer: Self-pay

## 2018-04-14 DIAGNOSIS — J0100 Acute maxillary sinusitis, unspecified: Secondary | ICD-10-CM

## 2018-04-14 MED ORDER — IPRATROPIUM BROMIDE 0.06 % NA SOLN: 2.0000 | Freq: Four times a day (QID) | NASAL | 0 refills | Status: DC

## 2018-04-14 NOTE — Progress Notes (Signed)
Virtual Visit via Telephone The purpose of this virtual visit is to provide medical care while limiting exposure to the novel coronavirus (COVID19) for both patient and office staff.  Consent was obtained for phone visit:  Yes.   Answered questions that patient had about telehealth interaction:  Yes.   I discussed the limitations, risks, security and privacy concerns of performing an evaluation and management service by telephone. I also discussed with the patient that there may be a patient responsible charge related to this service. The patient expressed understanding and agreed to proceed.  Patient Location: Home Provider Location: [facility])   ---------------------------------------------------------------------- Chief Complaint  Patient presents with  . Sore Throat    cough denies fever or SOB asking for Abx and steroids onset 8 month    S: Reviewed CMA telephone note below. I have called patient and gathered additional HPI as follows:  Reports that symptoms are mostly chronic but recently worse with some sinus drainage and sore throat with persistent cough, he has chronic COPD and has been treated with steroids x 2 rounds in past 3 weeks in March. He was treated by Pulmonology on 3/24 as well remotely, continues on Duoneb nebulizer and spiriva now added, he still has chronic regurgitation aspiration problems, and last apt with GI was re-scheduled due to patient unable to do video conference call on 04/12/18. - He is using Flonase with improvement, still has some sore throat and mucus production  Patient currently works as maintenance at apartment complex, and he was out of work Weds-Fri this week, asking for a note  Denies any high risk travel to areas of current concern for [place]. Denies any known or suspected exposure to person with or possibly with COVID19.  Admits chronic cough, mucus production Denies any fevers, chills, sweats, body ache, sinus pain  or pressure, headache, abdominal pain, diarrhea  -------------------------------------------------------------------------- O: No physical exam performed due to remote telephone encounter.  -------------------------------------------------------------------------- A&P:  Suspected Acute Sinusitis, possible for benign allergy vs viral etiology at onset In setting of chronic COPD emphysema, previous acute COPD in past frequently - poorly controlled and with history of aspiration/GERD/dysphagia complicating his situation - Reassuring without high risk symptoms - Afebrile, without dyspnea worsening - Comorbid pulmonary condition with COPD  - Currently patient is LOW RISK for COVID19 based on current symptoms and no known travel/exposure - however at this time, now [place] is currently within phase of community spread, and therefore patient can still be potentially exposed. Cannot rule out case with mild symptoms. Testing is not recommended at this time due to mild symptoms.  1. Not consistent with bacterial infection - advised I would defer repeat steroids and antibiotic again, already treated x 2 rounds steroids in 3 weeks in March, and has been on Levaquin 2 weeks ago as well 2. For congestion/drainage - Start Atrovent nasal spray decongestant 2 sprays in each nostril up to 4 times daily for 7 days 3. Continue Flonase 4. Continue current COPD maintenance management and duoneb nebulizer PRN 5. May return to Pulmonology if worsening cough 6. Emphasized he should re-schedule with GI for close follow-up due to his chronic dysphagia  Work note written 4/1 through 4/3 - return Monday 4/6 - he will pick up note  Meds ordered this encounter  Medications  . ipratropium (ATROVENT) 0.06 % nasal spray    Sig: Place 2 sprays into both nostrils 4 (four) times daily. For up to 5-7 days then stop.    Dispense:  15 mL    Refill:  0   OPTIONAL RECOMMENDED self quarantine for patient safety for PREVENTION  ONLY. It is not required based on current clinical symptoms. If they were to develop fever or worsening shortness of breath, then emphasis on REQUIRED quarantine for up to 7-14 days that could be resolved if fever free >3 days AND if symptoms improving after 7 days.   If symptoms do not resolve or significantly improve OR if WORSENING - fever / cough - or worsening shortness of breath - then should contact us and seek advice on next steps in treatment at home vs where/when to seek care at Urgent Care or Hospital ED for further intervention and possible testing if indicated.  Patient verbalizes understanding with the above medical recommendations including the limitation of remote medical advice.  Specific follow-up / call-back criteria were given for patient to follow-up or seek medical care more urgently if needed.  - Time spent in direct consultation with patient on phone: 8 minutes  Nobie Putnam, [facility] 04/14/2018, 8:21 AM

## 2018-04-17 ENCOUNTER — Other Ambulatory Visit: Payer: Self-pay | Admitting: Family Medicine

## 2018-04-17 DIAGNOSIS — Z8579 Personal history of other malignant neoplasms of lymphoid, hematopoietic and related tissues: Secondary | ICD-10-CM

## 2018-04-18 ENCOUNTER — Telehealth: Payer: Self-pay

## 2018-04-18 NOTE — Telephone Encounter (Signed)
Spoke to patient, he is aware of recommendations. Nothing further needed at this time.

## 2018-04-18 NOTE — Telephone Encounter (Signed)
Agree with those recommendation. He can expect to have excess mucus production for a few more weeks. As long as his breathing is ok this is not concerning.

## 2018-04-18 NOTE — Telephone Encounter (Signed)
States he still has some phlem on his lungs, went to PCP, stated they can't give him more abx, he must see [place] first.  Denies SOB more than normal, has been coughing up mucus, was green, clear now. Takes nose spray for allergies. Denies fever or chills. Patient is not using nebulizer or Spiriva routinely, advised to increase neb to every 4-6 hours and Spiriva daily. Will route to Dr. Juanell Fairly for further advice.

## 2018-04-19 ENCOUNTER — Ambulatory Visit: Payer: BLUE CROSS/BLUE SHIELD | Admitting: Gastroenterology

## 2018-04-28 ENCOUNTER — Other Ambulatory Visit: Payer: BLUE CROSS/BLUE SHIELD

## 2018-04-28 ENCOUNTER — Other Ambulatory Visit: Payer: Self-pay

## 2018-04-28 DIAGNOSIS — R7309 Other abnormal glucose: Secondary | ICD-10-CM | POA: Diagnosis not present

## 2018-04-29 LAB — HEMOGLOBIN A1C
Hgb A1c MFr Bld: 6.1 % of total Hgb — ABNORMAL HIGH (ref <5.7)
Mean Plasma Glucose: 128 (calc)
eAG (mmol/L): 7.1 (calc)

## 2018-05-01 ENCOUNTER — Encounter: Payer: Self-pay | Admitting: Family Medicine

## 2018-05-01 ENCOUNTER — Ambulatory Visit: Payer: BLUE CROSS/BLUE SHIELD | Admitting: Family Medicine

## 2018-05-01 ENCOUNTER — Ambulatory Visit (INDEPENDENT_AMBULATORY_CARE_PROVIDER_SITE_OTHER): Payer: BLUE CROSS/BLUE SHIELD | Admitting: Family Medicine

## 2018-05-01 ENCOUNTER — Other Ambulatory Visit: Payer: Self-pay

## 2018-05-01 DIAGNOSIS — R7303 Prediabetes: Secondary | ICD-10-CM

## 2018-05-01 DIAGNOSIS — M7551 Bursitis of right shoulder: Secondary | ICD-10-CM | POA: Diagnosis not present

## 2018-05-01 MED ORDER — CYCLOBENZAPRINE HCL 10 MG PO TABS: 10.0000 mg | ORAL_TABLET | Freq: Every evening | ORAL | 2 refills | Status: DC | PRN

## 2018-05-01 NOTE — Progress Notes (Signed)
Virtual Visit via Telephone The purpose of this virtual visit is to provide medical care while limiting exposure to the novel coronavirus (COVID19) for both patient and office staff.  Consent was obtained for phone visit:  Yes. Answered questions that patient had about telehealth interaction:  Yes.   I discussed the limitations, risks, security and privacy concerns of performing an evaluation and management service by telephone. I also discussed with the patient that there may be a patient responsible charge related to this service. The patient expressed understanding and agreed to proceed.  Patient Location: Home Provider Location: [facility])  ---------------------------------------------------------------------- Chief Complaint  Patient presents with  . PreDM  . Shoulder Pain    Right side getting worst onset 2 months    S: Reviewed CMA documentation. I have called patient and gathered additional HPI as follows:  Follow-up Pre-Diabetes No concerns. Last sugar 04/2018 - A1c 6.1 - similar to last time 6.1-6.2 in past >6 months CBGs:Not checking Meds:None CurrentlynotACEi / ARB Lifestyle: - Diet (Admits not always adhering to DM diet) - Exercise (Stays active, steps at home even in isolation) Denies hypoglycemia   Right Shoulder, Pain - Last visit with me 03/17/18, for initial visit for R shoulder pain from heavy lifting injury, treated with Prednisone burst, Flexeril, see prior notes for background information. - Interval update with he had significant improvement in R shoulder, on prednisone and muscle relaxant then finished meds several days of pain free or resolved symptoms, then gradual return of pain - Today patient reports still has persistent R shoulder pain now about 6 to 8 weeks, he says pain is now back to baseline where it was before last treatment. R shoulder pain, worse provoked if lifting arm above shoulder. Admits shoulder pain wakes him up  and causes radiating pain into arm, and hand, with numbness and tingling, will have sensation of numbness sometime when he wakes up - He was taking Flexeril 10mg  nightly with good results, has run out of this rx as it was short term PRN only, he is requesting refill to restart this - No prior R shoulder injury, surgery, or imaging procedure Denies other joint pain, erythema, fever chill, edema   Denies any high risk travel to areas of current concern for COVID19. Denies any known or suspected exposure to person with or possibly with COVID19.  Denies any fevers, chills, sweats, body ache, cough, shortness of breath, sinus pain or pressure, headache, abdominal pain, diarrhea  Past Medical History:  Diagnosis Date  . Anxiety   . Arthritis   . Carcinoma metastatic to lymph node ([place])   . Cataract    right  . COPD (chronic obstructive pulmonary disease) ([place])   . Depression   . GERD (gastroesophageal reflux disease)   . Hoarseness of voice   . Personal history of tobacco use, presenting hazards to health February of 2001  . Thyroid disease    Social History   Tobacco Use  . Smoking status: Current Some Day Smoker    Packs/day: 0.25    Years: 40.00    Pack years: 10.00    Types: Cigarettes  . Smokeless tobacco: Current User  Substance Use Topics  . Alcohol use: No  . Drug use: No    Current Outpatient Medications:  .  albuterol (PROVENTIL HFA;VENTOLIN HFA) 108 (90 Base) MCG/ACT inhaler, Inhale 2 puffs into the lungs every 4 (four) hours as needed for wheezing or shortness of breath., Disp: 1 Inhaler, Rfl: 3 .  albuterol (PROVENTIL) (2.5 MG/3ML) 0.083% nebulizer solution, Take 3 mLs (2.5 mg total) by nebulization every 2 (two) hours as needed for wheezing. Dx: copd exacerbation J44.1, Disp: 100 vial, Rfl: 0 .  fluticasone (FLONASE) 50 MCG/ACT nasal spray, Place 2 sprays into both nostrils daily. Use for 4-6 weeks then stop and use seasonally or as needed., Disp: 16 g, Rfl: 3 .   ipratropium (ATROVENT) 0.06 % nasal spray, Place 2 sprays into both nostrils 4 (four) times daily. For up to 5-7 days then stop., Disp: 15 mL, Rfl: 0 .  ipratropium-albuterol (DUONEB) 0.5-2.5 (3) MG/3ML SOLN, Take 3 mLs by nebulization 3 (three) times daily. USE THREE TIMES PER DAY UNTIL YOU FEEL BETTER., Disp: 360 mL, Rfl: 3 .  levothyroxine (SYNTHROID, LEVOTHROID) 88 MCG tablet, TAKE 1 TABLET BY MOUTH ONCE A DAY ON AN EMPTY STOMACH., Disp: 90 tablet, Rfl: 1 .  meloxicam (MOBIC) 15 MG tablet, TAKE 1 TABLET(15 MG) BY MOUTH DAILY, Disp: 30 tablet, Rfl: 2 .  Multiple Vitamin (MULTIVITAMIN WITH MINERALS) TABS tablet, Take 1 tablet by mouth daily. One-A-Day Multivitamin, Disp: , Rfl:  .  nicotine (NICODERM CQ - DOSED IN MG/24 HOURS) 21 mg/24hr patch, Place 1 patch (21 mg total) onto the skin daily., Disp: 30 patch, Rfl: 1 .  omeprazole (PRILOSEC) 20 MG capsule, Take 1 capsule (20 mg total) by mouth daily before breakfast. (Patient taking differently: Take 20 mg by mouth daily before lunch. ), Disp: 90 capsule, Rfl: 1 .  tiotropium (SPIRIVA HANDIHALER) 18 MCG inhalation capsule, Place 1 capsule (18 mcg total) into inhaler and inhale daily., Disp: 30 capsule, Rfl: 2 .  umeclidinium-vilanterol (ANORO ELLIPTA) 62.5-25 MCG/INH AEPB, Inhale 1 puff into the lungs daily., Disp: 7 each, Rfl: 0 .  cyclobenzaprine (FLEXERIL) 10 MG tablet, Take 1 tablet (10 mg total) by mouth at bedtime as needed for muscle spasms., Disp: 30 tablet, Rfl: 2  Depression screen [facility] 2/9 05/01/2018 04/14/2018 03/29/2018  Decreased Interest 0 0 0  Down, Depressed, Hopeless 0 0 0  PHQ - 2 Score 0 0 0    No flowsheet data found.  -------------------------------------------------------------------------- O: No physical exam performed due to remote telephone encounter.  Lab results reviewed.  Recent Labs    08/26/17 0811 12/06/17 0824 04/28/18 0812  HGBA1C 6.2* 6.1* 6.1*    Recent Results (from the past 2160 hour(s))  HgB A1c      Status: Abnormal   Collection Time: 04/28/18  8:12 AM  Result Value Ref Range   Hgb A1c MFr Bld 6.1 (H) <5.7 % of total Hgb    Comment: For someone without known diabetes, a hemoglobin  A1c value between 5.7% and 6.4% is consistent with prediabetes and should be confirmed with a  follow-up test. . For someone with known diabetes, a value <7% indicates that their diabetes is well controlled. A1c targets should be individualized based on duration of diabetes, age, comorbid conditions, and other considerations. . This assay result is consistent with an increased risk of diabetes. . Currently, no consensus exists regarding use of hemoglobin A1c for diagnosis of diabetes for children. .    Mean Plasma Glucose 128 (calc)   eAG (mmol/L) 7.1 (calc)    -------------------------------------------------------------------------- A&P:  Problem List Items Addressed This Visit    Chronic bursitis of right shoulder    Consistent with now chronic R-shoulder pain >2 months, still suspect bursitis vs rotator cuff tendinopathy with some reduced active ROM previously but without significant evidence of muscle tear (no weakness). -  NOTE INTERVAL - near resolution on steroid and flexeril then return of symptoms off meds  - Secondary to acute injury strain lifting injury - no prior imaging or dx arthritis in shoulder, has OADJD other joint.  Plan: 1. RE-ORDER Flexeril 10mg  nightly QHS - caution sedation, tolerated well with improvement - try for another 2-6+ weeks 2. May take Tylenol Ex Str 1-2 q 6 hr PRN 3. Relative rest but keep shoulder mobile, demonstrated ROM exercises, avoid heavy lifting 4. May try heating pad PRN 5. Follow-up 2-6 weeks if not improved for re-evaluation, recommend in office visit for subacromial shoulder steroid injection vs PT vs neuro/ortho refer      Relevant Medications   cyclobenzaprine (FLEXERIL) 10 MG tablet   Pre-diabetes - Primary    Stable - unchanged, still  mildly elevated A1c at 6.1, from prior 6.1 to 6.2  Plan:  1. Not on any therapy currently  2. Encourage improved lifestyle - low carb, low sugar diet, reduce portion size, continue improving activity future regular exercise 3. Follow-up 6 months for A1c         Meds ordered this encounter  Medications  . cyclobenzaprine (FLEXERIL) 10 MG tablet    Sig: Take 1 tablet (10 mg total) by mouth at bedtime as needed for muscle spasms.    Dispense:  30 tablet    Refill:  2    Follow-up: - Return in 2-4 weeks as needed if not improved R Shoulder, consider joint injection  Patient verbalizes understanding with the above medical recommendations including the limitation of remote medical advice.  Specific follow-up and call-back criteria were given for patient to follow-up or seek medical care more urgently if needed.   - Time spent in direct consultation with patient on phone: 12 minutes  Luke [name], [facility] 05/01/2018, 11:00 AM

## 2018-05-01 NOTE — Assessment & Plan Note (Signed)
Stable - unchanged, still mildly elevated A1c at 6.1, from prior 6.1 to 6.2  Plan:  1. Not on any therapy currently  2. Encourage improved lifestyle - low carb, low sugar diet, reduce portion size, continue improving activity future regular exercise 3. Follow-up 6 months for A1c

## 2018-05-01 NOTE — Assessment & Plan Note (Signed)
Consistent with now chronic R-shoulder pain >2 months, still suspect bursitis vs rotator cuff tendinopathy with some reduced active ROM previously but without significant evidence of muscle tear (no weakness). - NOTE INTERVAL - near resolution on steroid and flexeril then return of symptoms off meds  - Secondary to acute injury strain lifting injury - no prior imaging or dx arthritis in shoulder, has OADJD other joint.  Plan: 1. RE-ORDER Flexeril 10mg  nightly QHS - caution sedation, tolerated well with improvement - try for another 2-6+ weeks 2. May take Tylenol Ex Str 1-2 q 6 hr PRN 3. Relative rest but keep shoulder mobile, demonstrated ROM exercises, avoid heavy lifting 4. May try heating pad PRN 5. Follow-up 2-6 weeks if not improved for re-evaluation, recommend in office visit for subacromial shoulder steroid injection vs PT vs neuro/ortho refer

## 2018-05-01 NOTE — Patient Instructions (Addendum)
AVS given by phone, not access to [facility].

## 2018-06-09 ENCOUNTER — Ambulatory Visit: Payer: BLUE CROSS/BLUE SHIELD | Admitting: Gastroenterology

## 2018-06-14 ENCOUNTER — Telehealth: Payer: Self-pay | Admitting: *Deleted

## 2018-06-14 NOTE — Telephone Encounter (Signed)
Covid-19 screening questions  Have you traveled in the last 14 days? no If yes where?     Do you now or have you had a fever in the last 14 days? no  Do you have any respiratory symptoms of shortness of breath or cough now or in the last 14 days? No   Do you have any family members or close contacts with diagnosed or suspected Covid-19 in the past 14 days? No   Have you been tested for Covid-19 and found to be positive? no

## 2018-06-15 ENCOUNTER — Ambulatory Visit (INDEPENDENT_AMBULATORY_CARE_PROVIDER_SITE_OTHER): Payer: BLUE CROSS/BLUE SHIELD | Admitting: Gastroenterology

## 2018-06-15 ENCOUNTER — Encounter: Payer: Self-pay | Admitting: Gastroenterology

## 2018-06-15 VITALS — BP 98/74 | HR 80 | Temp 98.6°F | Ht 73.0 in | Wt 189.4 lb

## 2018-06-15 DIAGNOSIS — K219 Gastro-esophageal reflux disease without esophagitis: Secondary | ICD-10-CM

## 2018-06-15 DIAGNOSIS — R1310 Dysphagia, unspecified: Secondary | ICD-10-CM

## 2018-06-15 DIAGNOSIS — T17908S Unspecified foreign body in respiratory tract, part unspecified causing other injury, sequela: Secondary | ICD-10-CM

## 2018-06-15 NOTE — Progress Notes (Signed)
Luke [name]    [id number]    11-06-1950  Primary Care Physician:Karamalegos, Devonne Doughty, DO  Referring Physician: Olin Hauser, DO [address] [place], [place] [address]   Chief complaint: Dysphagia  HPI:  68 yr M here for follow-up visit with dysphagia. He did not notice any significant improvement after Botox injection.  Continues to choke intermittently while eating.  He said he he choked few weeks ago while eating burger and was still coughing up bits of pieces of meat 2 to 3 days after the episode. No unintentional weight loss.  No vomiting, abdominal pain, melena or blood per rectum   Barium esophagogram 11/08/2017: Exam limited due to aspiration.  Frank aspiration was noted during the exam and was discontinued.     EUS March 09, 2018: Upper esophageal sphincter widely open, small subepithelial lesion near EG junction and distal esophagus consistent with duplication cyst, 25 units of Botox was injected into 4 quadrants at the EG junction  EGD January 2020: Benign distal esophageal stricture dilated with 20 mm TTS balloon, 2 diminutive submucosal nodules in the lower esophagus near EG junction  Esophageal manometry January 2020 at showed elevated integrated relaxation pressure suggestive of a GJ outflow obstruction but did not meet criteria for achalasia  Modified barium swallow August 2019 showed prominent retention with multiple consistencies and aspiration of thin liquid.  EGD August 2019 showed decreased motility and esophageal body and possible distal esophageal sphincter spasm, was able to pass the scope without difficulty.  Biopsies showed features of reflux esophagitis  EGD August 2018 with findings suggestive of Schatzki's ring, dilated to 16 mm.     Outpatient Encounter Medications as of 06/15/2018  Medication Sig  . albuterol (PROVENTIL HFA;VENTOLIN HFA) 108 (90 Base) MCG/ACT inhaler Inhale 2 puffs into the lungs every 4 (four) hours  as needed for wheezing or shortness of breath.  Marland Kitchen albuterol (PROVENTIL) (2.5 MG/3ML) 0.083% nebulizer solution Take 3 mLs (2.5 mg total) by nebulization every 2 (two) hours as needed for wheezing. Dx: copd exacerbation J44.1  . fluticasone (FLONASE) 50 MCG/ACT nasal spray Place 2 sprays into both nostrils daily. Use for 4-6 weeks then stop and use seasonally or as needed.  Marland Kitchen levothyroxine (SYNTHROID, LEVOTHROID) 88 MCG tablet TAKE 1 TABLET BY MOUTH ONCE A DAY ON AN EMPTY STOMACH.  . meloxicam (MOBIC) 15 MG tablet TAKE 1 TABLET(15 MG) BY MOUTH DAILY  . Multiple Vitamin (MULTIVITAMIN WITH MINERALS) TABS tablet Take 1 tablet by mouth daily. One-A-Day Multivitamin  . omeprazole (PRILOSEC) 20 MG capsule Take 1 capsule (20 mg total) by mouth daily before breakfast. (Patient taking differently: Take 20 mg by mouth daily before lunch. )  . umeclidinium-vilanterol (ANORO ELLIPTA) 62.5-25 MCG/INH AEPB Inhale 1 puff into the lungs daily.  . [DISCONTINUED] cyclobenzaprine (FLEXERIL) 10 MG tablet Take 1 tablet (10 mg total) by mouth at bedtime as needed for muscle spasms.  . [DISCONTINUED] ipratropium (ATROVENT) 0.06 % nasal spray Place 2 sprays into both nostrils 4 (four) times daily. For up to 5-7 days then stop.  . [DISCONTINUED] ipratropium-albuterol (DUONEB) 0.5-2.5 (3) MG/3ML SOLN Take 3 mLs by nebulization 3 (three) times daily. USE THREE TIMES PER DAY UNTIL YOU FEEL BETTER.  . [DISCONTINUED] nicotine (NICODERM CQ - DOSED IN MG/24 HOURS) 21 mg/24hr patch Place 1 patch (21 mg total) onto the skin daily.  . [DISCONTINUED] tiotropium (SPIRIVA HANDIHALER) 18 MCG inhalation capsule Place 1 capsule (18 mcg total)  into inhaler and inhale daily.   No facility-administered encounter medications on file as of 06/15/2018.     Allergies as of 06/15/2018  . (No Known Allergies)    Past Medical History:  Diagnosis Date  . Anxiety   . Arthritis   . Carcinoma metastatic to lymph node ([place])   . Cataract    right   . COPD (chronic obstructive pulmonary disease) ([place])   . Depression   . GERD (gastroesophageal reflux disease)   . Hoarseness of voice   . Personal history of tobacco use, presenting hazards to health February of 2001  . Thyroid disease     Past Surgical History:  Procedure Laterality Date  . BALLOON DILATION N/A 01/30/2018   Procedure: BALLOON DILATION;  Surgeon: Mauri Pole, MD;  Location: [facility] ENDOSCOPY;  Service: Endoscopy;  Laterality: N/A;  . COLONOSCOPY    . COLONOSCOPY WITH PROPOFOL N/A 11/29/2016   Procedure: COLONOSCOPY WITH PROPOFOL;  Surgeon: Manya Silvas, MD;  Location[facility] ENDOSCOPY;  Service: Endoscopy;  Laterality: N/A;  . COLONOSCOPY WITH PROPOFOL N/A 08/22/2017   Procedure: COLONOSCOPY WITH PROPOFOL;  Surgeon: Jonathon Bellows, MD;  Location: [facility] ENDOSCOPY;  Service: Gastroenterology;  Laterality: N/A;  . ESOPHAGEAL MANOMETRY N/A 10/05/2017   Procedure: ESOPHAGEAL MANOMETRY (EM);  Surgeon: Jonathon Bellows, MD;  Location: [facility] ENDOSCOPY;  Service: Gastroenterology;  Laterality: N/A;  . ESOPHAGEAL MANOMETRY N/A 01/30/2018   Procedure: ESOPHAGEAL MANOMETRY (EM);  Surgeon: Mauri Pole, MD;  Location: [facility] ENDOSCOPY;  Service: Endoscopy;  Laterality: N/A;  . ESOPHAGOGASTRODUODENOSCOPY N/A 03/09/2018   Procedure: ESOPHAGOGASTRODUODENOSCOPY (EGD);  Surgeon: Milus Banister, MD;  Location: [facility] ENDOSCOPY;  Service: Endoscopy;  Laterality: N/A;  . ESOPHAGOGASTRODUODENOSCOPY (EGD) WITH PROPOFOL N/A 11/29/2016   Procedure: ESOPHAGOGASTRODUODENOSCOPY (EGD) WITH PROPOFOL;  Surgeon: Manya Silvas, MD;  Location[facility]COPY;  Service: Endoscopy;  Laterality: N/A;  . ESOPHAGOGASTRODUODENOSCOPY (EGD) WITH PROPOFOL N/A 08/22/2017   Procedure: ESOPHAGOGASTRODUODENOSCOPY (EGD) WITH PROPOFOL;  Surgeon: Jonathon Bellows, MD;  Location: [facility] ENDOSCOPY;  Service: Gastroenterology;  Laterality: N/A;  . ESOPHAGOGASTRODUODENOSCOPY (EGD) WITH PROPOFOL N/A 01/30/2018   Procedure:  ESOPHAGOGASTRODUODENOSCOPY (EGD) WITH PROPOFOL;  Surgeon: Mauri Pole, MD;  Location: [facility] ENDOSCOPY;  Service: Endoscopy;  Laterality: N/A;  Mano probe to be placed during EGD  . EUS N/A 03/09/2018   Procedure: UPPER ENDOSCOPIC ULTRASOUND (EUS) RADIAL;  Surgeon: Milus Banister, MD;  Location: [facility] ENDOSCOPY;  Service: Endoscopy;  Laterality: N/A;  . NECK SURGERY     status post diagnosis of cancer with chemo and radiation  . SUBMUCOSAL INJECTION  03/09/2018   Procedure: SUBMUCOSAL INJECTION;  Surgeon: Milus Banister, MD;  Location: [facility] ENDOSCOPY;  Service: Endoscopy;;  . TONSILLECTOMY      Family History  Problem Relation Age of Onset  . Breast cancer Mother   . Heart disease Mother   . Cancer Mother        breast, back, bladder  . Heart attack Father   . Heart disease Father     Social History   Socioeconomic History  . Marital status: Single    Spouse name: Not on file  . Number of children: 1  . Years of education: [employer]  . Highest education level: High school graduate  Occupational History  . Not on file  Social Needs  . Financial resource strain: Not on file  . Food insecurity:    Worry: Not on file    Inability: Not on file  . Transportation needs:  Medical: Not on file    Non-medical: Not on file  Tobacco Use  . Smoking status: Current Some Day Smoker    Packs/day: 0.25    Years: 40.00    Pack years: 10.00    Types: Cigarettes  . Smokeless tobacco: Current User  Substance and Sexual Activity  . Alcohol use: No  . Drug use: No  . Sexual activity: Not on file  Lifestyle  . Physical activity:    Days per week: Not on file    Minutes per session: Not on file  . Stress: Not on file  Relationships  . Social connections:    Talks on phone: Not on file    Gets together: Not on file    Attends religious service: Not on file    Active member of club or organization: Not on file    Attends meetings of clubs or organizations: Not on file     Relationship status: Not on file  . Intimate partner violence:    Fear of current or ex partner: Not on file    Emotionally abused: Not on file    Physically abused: Not on file    Forced sexual activity: Not on file  Other Topics Concern  . Not on file  Social History Narrative  . Not on file      Review of systems: Review of Systems  Constitutional: Negative for fever and chills.  HENT: Negative.   Eyes: Negative for blurred vision.  Respiratory: Positive for cough, shortness of breath and wheezing.   Cardiovascular: Negative for chest pain and palpitations.  Gastrointestinal: as per HPI Genitourinary: Negative for dysuria, urgency, frequency and hematuria.  Musculoskeletal: Negative for myalgias, back pain and joint pain.  Skin: Negative for itching and rash.  Neurological: Negative for dizziness, tremors, focal weakness, seizures and loss of consciousness.  Endo/Heme/Allergies: Negative for seasonal allergies.  Psychiatric/Behavioral: Negative for depression, suicidal ideas and hallucinations.  All other systems reviewed and are negative.   Physical Exam: Vitals:   06/15/18 0951  BP: 98/74  Pulse: 80  Temp: 98.6 F (37 C)   Body mass index is 24.99 kg/m. Gen:      No acute distress HEENT:  EOMI, sclera anicteric Neck:     No masses; no thyromegaly Lungs:    Clear to auscultation bilaterally; normal respiratory effort CV:         Regular rate and rhythm; no murmurs Abd:      + bowel sounds; soft, non-tender; no palpable masses, no distension Ext:    No edema; adequate peripheral perfusion Skin:      Warm and dry; no rash Neuro: alert and oriented x 3 Psych: normal mood and affect  Data Reviewed:  Reviewed labs, radiology imaging, old records and pertinent past GI work up   Assessment and Plan/Recommendations:  68 year old male with history of lymphoma status post radiation, hypothyroidism with chronic dysphagia  He has esophageal gastric junction  outflow obstruction based on manometry.  No significant improvement after dilation with TTS balloon 20 mm.  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  No improvement with Botox injection  Has significant oropharyngeal dysphagia with prominent aspiration noted on barium esophagram into bilateral bronchus.  Weak upper esophageal sphincter.  Dysmotility could be secondary to radiation injury.  Discussed in detail the pathology and different components of his dysphagia. His symptoms are predominantly secondary to oropharyngeal dysphagia and less likely to improve with disruption of lower esophageal sphincter, may potentially  worsen his condition.  He is tolerating oral diet.  He is able to maintain his nutritional status with no significant weight loss  We will continue to monitor, hold off pneumatic dilation or POEM at this point  Continue omeprazole 20 mg daily Discussed anti-reflux measures in detail  Advised him to eat small bites/small sips of fluid and chin tuck while swallowing  Follow-up in 6 months   K. Denzil Magnuson , MD    CC: Nobie Putnam *

## 2018-06-15 NOTE — Patient Instructions (Signed)
Gastroesophageal Reflux Disease, Adult Gastroesophageal reflux (GER) happens when acid from the stomach flows up into the tube that connects the mouth and the stomach (esophagus). Normally, food travels down the esophagus and stays in the stomach to be digested. However, when a person has GER, food and stomach acid sometimes move back up into the esophagus. If this becomes a more serious problem, the person may be diagnosed with a disease called gastroesophageal reflux disease (GERD). GERD occurs when the reflux:  Happens often.  Causes frequent or severe symptoms.  Causes problems such as damage to the esophagus. When stomach acid comes in contact with the esophagus, the acid may cause soreness (inflammation) in the esophagus. Over time, GERD may create small holes (ulcers) in the lining of the esophagus. What are the causes? This condition is caused by a problem with the muscle between the esophagus and the stomach (lower esophageal sphincter, or LES). Normally, the LES muscle closes after food passes through the esophagus to the stomach. When the LES is weakened or abnormal, it does not close properly, and that allows food and stomach acid to go back up into the esophagus. The LES can be weakened by certain dietary substances, medicines, and medical conditions, including:  Tobacco use.  Pregnancy.  Having a hiatal hernia.  Alcohol use.  Certain foods and beverages, such as coffee, chocolate, onions, and peppermint. What increases the risk? You are more likely to develop this condition if you:  Have an increased body weight.  Have a connective tissue disorder.  Use NSAID medicines. What are the signs or symptoms? Symptoms of this condition include:  Heartburn.  Difficult or painful swallowing.  The feeling of having a lump in the throat.  Abitter taste in the mouth.  Bad breath.  Having a large amount of saliva.  Having an upset or bloated stomach.  Belching.   Chest pain. Different conditions can cause chest pain. Make sure you see your health care provider if you experience chest pain.  Shortness of breath or wheezing.  Ongoing (chronic) cough or a night-time cough.  Wearing away of tooth enamel.  Weight loss. How is this diagnosed? Your health care provider will take a medical history and perform a physical exam. To determine if you have mild or severe GERD, your health care provider may also monitor how you respond to treatment. You may also have tests, including:  A test to examine your stomach and esophagus with a small camera (endoscopy).  A test thatmeasures the acidity level in your esophagus.  A test thatmeasures how much pressure is on your esophagus.  A barium swallow or modified barium swallow test to show the shape, size, and functioning of your esophagus. How is this treated? The goal of treatment is to help relieve your symptoms and to prevent complications. Treatment for this condition may vary depending on how severe your symptoms are. Your health care provider may recommend:  Changes to your diet.  Medicine.  Surgery. Follow these instructions at home: Eating and drinking   Follow a diet as recommended by your health care provider. This may involve avoiding foods and drinks such as: ? Coffee and tea (with or without caffeine). ? Drinks that containalcohol. ? Energy drinks and sports drinks. ? Carbonated drinks or sodas. ? Chocolate and cocoa. ? Peppermint and mint flavorings. ? Garlic and onions. ? Horseradish. ? Spicy and acidic foods, including peppers, chili powder, curry powder, vinegar, hot sauces, and barbecue sauce. ? Citrus fruit juices and citrus  fruits, such as oranges, lemons, and limes. ? Tomato-based foods, such as red sauce, chili, salsa, and pizza with red sauce. ? Fried and fatty foods, such as donuts, french fries, potato chips, and high-fat dressings. ? High-fat meats, such as hot dogs and  fatty cuts of red and white meats, such as rib eye steak, sausage, ham, and bacon. ? High-fat dairy items, such as whole milk, butter, and cream cheese.  Eat small, frequent meals instead of large meals.  Avoid drinking large amounts of liquid with your meals.  Avoid eating meals during the 2-3 hours before bedtime.  Avoid lying down right after you eat.  Do not exercise right after you eat. Lifestyle   Do not use any products that contain nicotine or tobacco, such as cigarettes, e-cigarettes, and chewing tobacco. If you need help quitting, ask your health care provider.  Try to reduce your stress by using methods such as yoga or meditation. If you need help reducing stress, ask your health care provider.  If you are overweight, reduce your weight to an amount that is healthy for you. Ask your health care provider for guidance about a safe weight loss goal. General instructions  Pay attention to any changes in your symptoms.  Take over-the-counter and prescription medicines only as told by your health care provider. Do not take aspirin, ibuprofen, or other NSAIDs unless your health care provider told you to do so.  Wear loose-fitting clothing. Do not wear anything tight around your waist that causes pressure on your abdomen.  Raise (elevate) the head of your bed about 6 inches (15 cm).  Avoid bending over if this makes your symptoms worse.  Keep all follow-up visits as told by your health care provider. This is important. Contact a health care provider if:  You have: ? New symptoms. ? Unexplained weight loss. ? Difficulty swallowing or it hurts to swallow. ? Wheezing or a persistent cough. ? A hoarse voice.  Your symptoms do not improve with treatment. Get help right away if you:  Have pain in your arms, neck, jaw, teeth, or back.  Feel sweaty, dizzy, or light-headed.  Have chest pain or shortness of breath.  Vomit and your vomit looks like blood or coffee grounds.   Faint.  Have stool that is bloody or black.  Cannot swallow, drink, or eat. Summary  Gastroesophageal reflux happens when acid from the stomach flows up into the esophagus. GERD is a disease in which the reflux happens often, causes frequent or severe symptoms, or causes problems such as damage to the esophagus.  Treatment for this condition may vary depending on how severe your symptoms are. Your health care provider may recommend diet and lifestyle changes, medicine, or surgery.  Contact a health care provider if you have new or worsening symptoms.  Take over-the-counter and prescription medicines only as told by your health care provider. Do not take aspirin, ibuprofen, or other NSAIDs unless your health care provider told you to do so.  Keep all follow-up visits as told by your health care provider. This is important. This information is not intended to replace advice given to you by your health care provider. Make sure you discuss any questions you have with your health care provider. Document Released: 10/07/2004 Document Revised: 07/06/2017 Document Reviewed: 07/06/2017 Elsevier Interactive Patient Education  2019 [place] with eating and drinking  Follow up in 6 months  I appreciate the  opportunity to care for you  Thank  You   Harl Bowie , MD

## 2018-06-28 ENCOUNTER — Ambulatory Visit (INDEPENDENT_AMBULATORY_CARE_PROVIDER_SITE_OTHER): Payer: BC Managed Care – PPO | Admitting: Family Medicine

## 2018-06-28 ENCOUNTER — Encounter: Payer: Self-pay | Admitting: Family Medicine

## 2018-06-28 ENCOUNTER — Other Ambulatory Visit: Payer: Self-pay

## 2018-06-28 DIAGNOSIS — M7551 Bursitis of right shoulder: Secondary | ICD-10-CM | POA: Diagnosis not present

## 2018-06-28 DIAGNOSIS — J432 Centrilobular emphysema: Secondary | ICD-10-CM

## 2018-06-28 MED ORDER — ALBUTEROL SULFATE HFA 108 (90 BASE) MCG/ACT IN AERS: 2.0000 | INHALATION_SPRAY | RESPIRATORY_TRACT | 3 refills | Status: DC | PRN

## 2018-06-28 MED ORDER — ANORO ELLIPTA 62.5-25 MCG/INH IN AEPB: 1.0000 | INHALATION_SPRAY | Freq: Every day | RESPIRATORY_TRACT | 3 refills | Status: DC

## 2018-06-28 NOTE — Assessment & Plan Note (Signed)
Persistent recurrent chronic R-shoulder pain >3 months, still suspect bursitis vs rotator cuff tendinopathy with some reduced active ROM previously but without significant evidence of muscle tear (no weakness). - NOTE INTERVAL - near resolution on steroid and flexeril then return of symptoms off meds  - Secondary to acute injury strain lifting injury - no prior imaging or dx arthritis in shoulder, has OADJD other joint.  Plan: 1. Order R Shoulder X-ray - [facility] - walk in, f/u results - advised will need x-ray first before future consider Ortho or MRI imaging, if abnormal or still not improved, he can schedule for office visit for in person steroid subacromial injection soon, and we can refer to ortho based on x-ray results 2. Continue other conservative therapy - Tylenol, Muscle relaxant 3. Relative rest but keep shoulder mobile, demonstrated ROM exercises, avoid heavy lifting 4. May try heating pad PRN 5. Follow-up 1-6 weeks if not improved for re-evaluation, recommend in office visit for subacromial shoulder steroid injection vs PT vs neuro/ortho refer

## 2018-06-28 NOTE — Assessment & Plan Note (Signed)
No acute flare up today Followed by [facility]  Refill Anoro, Albuterol

## 2018-06-28 NOTE — Patient Instructions (Signed)
No active mychart. AVS info given by phone.

## 2018-06-28 NOTE — Progress Notes (Signed)
Virtual Visit via Telephone The purpose of this virtual visit is to provide medical care while limiting exposure to the novel coronavirus (COVID19) for both patient and office staff.  Consent was obtained for phone visit:  Yes.   Answered questions that patient had about telehealth interaction:  Yes.   I discussed the limitations, risks, security and privacy concerns of performing an evaluation and management service by telephone. I also discussed with the patient that there may be a patient responsible charge related to this service. The patient expressed understanding and agreed to proceed.  Patient Location: Home Provider Location: [facility])  ---------------------------------------------------------------------- Chief Complaint  Patient presents with  . Shoulder Pain    intermittent  Rt shoulder pain that radiates down the arm x 48mths ago. Pain worsen with different ROM. The pt is requesting a MRI      S: Reviewed CMA documentation. I have called patient and gathered additional HPI as follows:  Chronic Right Shoulder, Pain - Last visit with me 03/2018, for initial visit for same problem onset of pain was recent in Feb/march 2020 thought he strained or injured shoulder with heavy lifting accident, treated with Muscle relaxant and steroid course for bursitis , see prior notes for background information. - Interval update with temporary significant relief but never resolved and it later returned - Today patient reports now persistent R shoulder pain, worse provoked if lifting arm above shoulder. Admits shoulder pain wakes him up and causes radiating pain into arm, and hand, with numbness and tingling, will have sensation of numbness sometime when he wakes up - No prior R shoulder injury, surgery, or imaging procedure - he is requesting X-ray Denies other joint pain, erythema, fever chill, edema   Centrilobular Emphysema (COPD) Currently without flare up. Has  followed Pulmonology regularly. He is due for refill Anoro and Albuterol  Denies any fevers, chills, sweats, body ache, sinus pain or pressure, headache, abdominal pain, diarrhea  Past Medical History:  Diagnosis Date  . Anxiety   . Arthritis   . Carcinoma metastatic to lymph node ([place])   . Cataract    right  . COPD (chronic obstructive pulmonary disease) ([place])   . Depression   . GERD (gastroesophageal reflux disease)   . Hoarseness of voice   . Personal history of tobacco use, presenting hazards to health February of 2001  . Thyroid disease    Social History   Tobacco Use  . Smoking status: Current Some Day Smoker    Packs/day: 0.25    Years: 40.00    Pack years: 10.00    Types: Cigarettes  . Smokeless tobacco: Never Used  Substance Use Topics  . Alcohol use: No  . Drug use: No    Current Outpatient Medications:  .  albuterol (PROVENTIL) (2.5 MG/3ML) 0.083% nebulizer solution, Take 3 mLs (2.5 mg total) by nebulization every 2 (two) hours as needed for wheezing. Dx: copd exacerbation J44.1, Disp: 100 vial, Rfl: 0 .  albuterol (VENTOLIN HFA) 108 (90 Base) MCG/ACT inhaler, Inhale 2 puffs into the lungs every 4 (four) hours as needed for wheezing or shortness of breath., Disp: 1 Inhaler, Rfl: 3 .  fluticasone (FLONASE) 50 MCG/ACT nasal spray, Place 2 sprays into both nostrils daily. Use for 4-6 weeks then stop and use seasonally or as needed., Disp: 16 g, Rfl: 3 .  levothyroxine (SYNTHROID, LEVOTHROID) 88 MCG tablet, TAKE 1 TABLET BY MOUTH ONCE A DAY ON AN EMPTY STOMACH., Disp: 90 tablet, Rfl: 1 .  meloxicam (MOBIC) 15 MG tablet, TAKE 1 TABLET(15 MG) BY MOUTH DAILY, Disp: 30 tablet, Rfl: 2 .  Multiple Vitamin (MULTIVITAMIN WITH MINERALS) TABS tablet, Take 1 tablet by mouth daily. One-A-Day Multivitamin, Disp: , Rfl:  .  omeprazole (PRILOSEC) 20 MG capsule, Take 1 capsule (20 mg total) by mouth daily before breakfast. (Patient taking differently: Take 20 mg by mouth daily before  lunch. ), Disp: 90 capsule, Rfl: 1 .  umeclidinium-vilanterol (ANORO ELLIPTA) 62.5-25 MCG/INH AEPB, Inhale 1 puff into the lungs daily., Disp: 60 each, Rfl: 3  Depression screen [facility] 2/9 05/01/2018 04/14/2018 03/29/2018  Decreased Interest 0 0 0  Down, Depressed, Hopeless 0 0 0  PHQ - 2 Score 0 0 0    No flowsheet data found.  -------------------------------------------------------------------------- O: No physical exam performed due to remote telephone encounter.  Lab results reviewed.  Recent Results (from the past 2160 hour(s))  HgB A1c     Status: Abnormal   Collection Time: 04/28/18  8:12 AM  Result Value Ref Range   Hgb A1c MFr Bld 6.1 (H) <5.7 % of total Hgb    Comment: For someone without known diabetes, a hemoglobin  A1c value between 5.7% and 6.4% is consistent with prediabetes and should be confirmed with a  follow-up test. . For someone with known diabetes, a value <7% indicates that their diabetes is well controlled. A1c targets should be individualized based on duration of diabetes, age, comorbid conditions, and other considerations. . This assay result is consistent with an increased risk of diabetes. . Currently, no consensus exists regarding use of hemoglobin A1c for diagnosis of diabetes for children. .    Mean Plasma Glucose 128 (calc)   eAG (mmol/L) 7.1 (calc)    -------------------------------------------------------------------------- A&P:  Problem List Items Addressed This Visit    Centrilobular emphysema (HCC)    No acute flare up today Followed by [facility]  Refill Anoro, Albuterol      Relevant Medications   albuterol (VENTOLIN HFA) 108 (90 Base) MCG/ACT inhaler   umeclidinium-vilanterol (ANORO ELLIPTA) 62.5-25 MCG/INH AEPB   Chronic bursitis of right shoulder - Primary    Persistent recurrent chronic R-shoulder pain >3 months, still suspect bursitis vs rotator cuff tendinopathy with some reduced active ROM previously but without  significant evidence of muscle tear (no weakness). - NOTE INTERVAL - near resolution on steroid and flexeril then return of symptoms off meds  - Secondary to acute injury strain lifting injury - no prior imaging or dx arthritis in shoulder, has OADJD other joint.  Plan: 1. Order R Shoulder X-ray - [facility] - walk in, f/u results - advised will need x-ray first before future consider Ortho or MRI imaging, if abnormal or still not improved, he can schedule for office visit for in person steroid subacromial injection soon, and we can refer to ortho based on x-ray results 2. Continue other conservative therapy - Tylenol, Muscle relaxant 3. Relative rest but keep shoulder mobile, demonstrated ROM exercises, avoid heavy lifting 4. May try heating pad PRN 5. Follow-up 1-6 weeks if not improved for re-evaluation, recommend in office visit for subacromial shoulder steroid injection vs PT vs neuro/ortho refer      Relevant Orders   DG Shoulder Right      Meds ordered this encounter  Medications  . albuterol (VENTOLIN HFA) 108 (90 Base) MCG/ACT inhaler    Sig: Inhale 2 puffs into the lungs every 4 (four) hours as needed for wheezing or shortness of breath.  Dispense:  1 Inhaler    Refill:  3  . umeclidinium-vilanterol (ANORO ELLIPTA) 62.5-25 MCG/INH AEPB    Sig: Inhale 1 puff into the lungs daily.    Dispense:  60 each    Refill:  3    Follow-up: - Return as needed in few weeks or sooner if not improved R shoulder for Steroid injection and referral to Ortho  Patient verbalizes understanding with the above medical recommendations including the limitation of remote medical advice.  Specific follow-up and call-back criteria were given for patient to follow-up or seek medical care more urgently if needed.   - Time spent in direct consultation with patient on phone: 11 minutes  Nobie Putnam, [facility] 06/28/2018, 10:39 AM

## 2018-06-29 ENCOUNTER — Ambulatory Visit
Admission: RE | Admit: 2018-06-29 | Discharge: 2018-06-29 | Disposition: A | Discharge status: Home / Self Care | Payer: BC Managed Care – PPO | Source: Ambulatory Visit | Attending: Family Medicine | Admitting: Family Medicine

## 2018-06-29 DIAGNOSIS — M7551 Bursitis of right shoulder: Secondary | ICD-10-CM | POA: Insufficient documentation

## 2018-06-29 DIAGNOSIS — M25511 Pain in right shoulder: Secondary | ICD-10-CM | POA: Diagnosis not present

## 2018-06-30 NOTE — Progress Notes (Signed)
Tried to call patient but he does not have a Network engineer. Can not leave a message.

## 2018-07-03 ENCOUNTER — Encounter: Payer: Self-pay | Admitting: Family Medicine

## 2018-07-03 ENCOUNTER — Ambulatory Visit (INDEPENDENT_AMBULATORY_CARE_PROVIDER_SITE_OTHER): Payer: BC Managed Care – PPO | Admitting: Family Medicine

## 2018-07-03 ENCOUNTER — Other Ambulatory Visit: Payer: Self-pay

## 2018-07-03 VITALS — BP 80/56 | HR 75 | Temp 98.8°F | Resp 16 | Ht 73.0 in | Wt 191.6 lb

## 2018-07-03 DIAGNOSIS — S30860A Insect bite (nonvenomous) of lower back and pelvis, initial encounter: Secondary | ICD-10-CM | POA: Diagnosis not present

## 2018-07-03 DIAGNOSIS — W57XXXA Bitten or stung by nonvenomous insect and other nonvenomous arthropods, initial encounter: Secondary | ICD-10-CM

## 2018-07-03 DIAGNOSIS — M7551 Bursitis of right shoulder: Secondary | ICD-10-CM

## 2018-07-03 MED ORDER — DOXYCYCLINE HYCLATE 100 MG PO TABS: 200.0000 mg | ORAL_TABLET | Freq: Once | ORAL | 0 refills | Status: AC

## 2018-07-03 MED ORDER — LIDOCAINE HCL (PF) 1 % IJ SOLN
4.0000 mL | Freq: Once | INTRAMUSCULAR | Status: AC
  Administered 2018-07-03: 4 mL

## 2018-07-03 MED ORDER — METHYLPREDNISOLONE ACETATE 40 MG/ML IJ SUSP
40.0000 mg | Freq: Once | INTRAMUSCULAR | Status: AC
  Administered 2018-07-03: 40 mg via INTRA_ARTICULAR

## 2018-07-03 NOTE — Patient Instructions (Addendum)
Thank you for coming to the office today.  You received a Right Shoulder Joint steroid injection today. - Lidocaine numbing medicine may ease the pain initially for a few hours until it wears off - As discussed, you may experience a "steroid flare" this evening or within 24-48 hours, anytime medicine is injected into an inflamed joint it can cause the pain to get worse temporarily - Everyone responds differently to these injections, it depends on the patient and the severity of the joint problem, it may provide anywhere from days to weeks, to months of relief. Ideal response is >6 months relief - Try to take it easy for next 1-2 days, avoid over activity and strain on joint (limit lifting for shoulder) - Recommend the following:   - For swelling - rest, compression sleeve / ACE wrap, elevation, and ice packs as needed for first few days   - For pain in future may use heating pad or moist heat as needed  - Given the nature of your injury duration of your pain/injury, I am concerned for torn rotator cuff muscle in your shoulder. If not improving as expected over next several weeks, please follow-up sooner for re-evaluation.   We would likely refer to:  [facility] ([facility] Address: [place], [place],  [address] Hours:  9AM-5PM Phone: [phone]  For tick bite - < 72 hours, will use one dose of doxycycline -= take both pills today then done, take with full glass of water make sure stay upright when you take It for next 30 min, don't lay down immediately after medicine.  Please schedule a Follow-up Appointment to: Return in about 3 months (around 10/03/2018), or if symptoms worsen or fail to improve, for shoulder.  If you have any other questions or concerns, please feel free to call the office or send a message through [place]. You may also schedule an earlier appointment if necessary.  Additionally, you may be receiving a survey about your  experience at our office within a few days to 1 week by e-mail or mail. We value your feedback.  Nobie Putnam, DO [place]

## 2018-07-03 NOTE — Progress Notes (Signed)
Subjective:    Patient ID: Luke [name], male    DOB: 1950-06-30, 68 y.o.   MRN: [id number]  [name] is a 68 y.o. male presenting on 07/03/2018 for Shoulder Pain (Right side)   HPI  Chronic Right Shoulder, Pain - Last visit with me 06/28/18 virtual visit by phone, thought to be bursitis vs rotator cuff given course with improvement on prednisone temporary but then worsened again, he had X-ray ordered R shoulder in interval, done on 6/18, see results below, without arthritis or fracture or dislocation or other problem - Now today here for shoulder injection. Still has same pain persistent R shoulder pain, worse provoked if lifting arm above shoulder. Admits shoulder pain wakes him up and causes radiating pain into arm, and hand, with numbness and tingling, will have sensation of numbness sometime when he wakes up - often waking him up - Has not seen Orthopedic specialist for this problem Denies other joint pain, erythema, fever chill, edema   Tick Bite, Buttocks Noticed on Friday, now has a red spot from bite and itching. No extending rash or fever or chills   Depression screen [facility] 2/9 05/01/2018 04/14/2018 03/29/2018  Decreased Interest 0 0 0  Down, Depressed, Hopeless 0 0 0  PHQ - 2 Score 0 0 0    Social History   Tobacco Use  . Smoking status: Current Some Day Smoker    Packs/day: 0.25    Years: 40.00    Pack years: 10.00    Types: Cigarettes  . Smokeless tobacco: Never Used  Substance Use Topics  . Alcohol use: No  . Drug use: No    Review of Systems Per HPI unless specifically indicated above     Objective:    BP (!) 80/56 Comment: manual  Pulse 75   Temp 98.8 F (37.1 C) (Oral)   Resp 16   Ht 6\' 1"  (1.854 m)   Wt 191 lb 9.6 oz (86.9 kg)   BMI 25.28 kg/m   Wt Readings from Last 3 Encounters:  07/03/18 191 lb 9.6 oz (86.9 kg)  06/15/18 189 lb 6 oz (85.9 kg)  03/29/18 189 lb 9.6 oz (86 kg)    Physical Exam Vitals signs and nursing note reviewed.   Constitutional:      General: He is not in acute distress.    Appearance: He is well-developed. He is not diaphoretic.     Comments: Well-appearing, comfortable, cooperative  HENT:     Head: Normocephalic and atraumatic.  Eyes:     General:        Right eye: No discharge.        Left eye: No discharge.     Conjunctiva/sclera: Conjunctivae normal.  Cardiovascular:     Rate and Rhythm: Normal rate.  Pulmonary:     Effort: Pulmonary effort is normal.  Musculoskeletal:     Comments: RIGHT Shoulder - mostly unchanged since last exam 03/2018  Inspection: Normal appearance bilateral symmetrical Palpation: mild tender to palpation over anterior ROM: Significantly reduced active ROM forward flexion and abduction, but intact internal rotation behind back Special Testing: Rotator cuff testing negative for weakness with supraspinatus full can = but pain and some weak on empty can test, Hawkin's AC impingement positive for pain Strength: Normal strength 5/5 flex/ext, ext rot / int rot, grip, rotator cuff str testing. Neurovascular: Distally intact pulses, sensation to light touch   Skin:    General: Skin is warm and dry.     Findings:  No erythema or rash.     Comments: Right Buttock in gluteal cleft small < 1 cm area of localized bite with slight erythema, without extension, non tender, no drainage, no erythema migrans or radiating rash, no bulls eye appearance, no remaining tick  Neurological:     Mental Status: He is alert and oriented to person, place, and time.  Psychiatric:        Behavior: Behavior normal.     Comments: Well groomed, good eye contact, normal speech and thoughts      ________________________________________________________ PROCEDURE NOTE Date: 07/03/18 Right Shoulder subacromial injection Discussed benefits and risks (including pain, bleeding, infection, steroid flare). Verbal consent given by patient. Medication:  1 cc Depo-medrol 40mg  and 4 cc Lidocaine 1% without  epi Time Out taken  Landmarks identified. Area cleansed with alcohol wipes. Using 21 gauge and 1, 1/2 inch needle, Right subacromial bursa space was injected (with above listed medication) via posterior approach cold spray used for superficial anesthetic. Sterile bandage placed. Patient tolerated procedure well without bleeding or paresthesias. No complications.    I have personally reviewed the radiology report from R shoulder X-ray on 06/29/18.  CLINICAL DATA:  Pain in rt shoulder laterally and throughout joint and into upper arm x 4 months, he did pick up a tv to throw in the dump around the time of onset of pain, no other prev injury or surgery, patient has increased pain with lifting and moving  EXAM: RIGHT SHOULDER - 2+ VIEW  COMPARISON:  None.  FINDINGS: There is no evidence of fracture or dislocation. There is no evidence of arthropathy or other focal bone abnormality. Soft tissues are unremarkable.  IMPRESSION: Negative.   Electronically Signed   By: Lucrezia Europe M.D.   On: 06/30/2018 09:46  Results for orders placed or performed in visit on 04/28/18  HgB A1c  Result Value Ref Range   Hgb A1c MFr Bld 6.1 (H) <5.7 % of total Hgb   Mean Plasma Glucose 128 (calc)   eAG (mmol/L) 7.1 (calc)      Assessment & Plan:   Problem List Items Addressed This Visit    Chronic bursitis of right shoulder - Primary    Other Visit Diagnoses    Tick bite of buttock, initial encounter       Relevant Medications   doxycycline (VIBRA-TABS) 100 MG tablet    No sign of secondary infection or unlikely concern for lyme disease, < 24 hours and no erythema migrans or other evidence - Rx prophylaxis doxycycline 200mg  x 1 dose, within 72 hours F/u return precautions given   ---    Persistent recurrent chronic R-shoulder pain >3 months, still suspect bursitis vs rotator cuff tendinopathy with some reduced active ROM previously but without significant evidence of muscle tear (no  weakness). - NOTE INTERVAL - near resolution on steroid and flexeril then return of symptoms off meds - Secondary to acute injury strain lifting injury - X-ray now on 6/18 - see above, no arthritis or fracture, suspicious for muscle / rotator cuff soft tissue injury now based on that x-ray  Plan: 1. Right shoulder subacromial steroid injection today, see procedure note 2. Continue current medications as prescribed 3. Relative rest but keep shoulder mobile, demonstrated ROM exercises, avoid heavy lifting 4. May try heating pad PRN 5. Notify office if not improving sooner, can refer to [facility] as next step for consultation and further management, may need MRI - ultimately if not effective w/ injection for longer than  6 wk to 3 months or if returns to same level of pain, then will need to be referred, suspect rotator cuff tendinopathy   Meds ordered this encounter  Medications  . lidocaine (PF) (XYLOCAINE) 1 % injection 4 mL  . methylPREDNISolone acetate (DEPO-MEDROL) injection 40 mg  . doxycycline (VIBRA-TABS) 100 MG tablet    Sig: Take 2 tablets (200 mg total) by mouth once for 1 dose. For for 1 dose. Take with full glass of water, stay upright 30 min after taking.    Dispense:  2 tablet    Refill:  0    Follow up plan: Return in about 3 months (around 10/03/2018), or if symptoms worsen or fail to improve, for shoulder.   Nobie Putnam, [facility] 07/03/2018, 3:58 PM

## 2018-07-16 ENCOUNTER — Other Ambulatory Visit: Payer: Self-pay | Admitting: Family Medicine

## 2018-07-16 DIAGNOSIS — Z8579 Personal history of other malignant neoplasms of lymphoid, hematopoietic and related tissues: Secondary | ICD-10-CM

## 2018-07-19 ENCOUNTER — Ambulatory Visit: Payer: BLUE CROSS/BLUE SHIELD | Admitting: Internal Medicine

## 2018-07-26 ENCOUNTER — Telehealth: Payer: Self-pay | Admitting: *Deleted

## 2018-07-26 NOTE — Telephone Encounter (Signed)
Pt had his sister to call and ask to call him back. He has fatigue and he is concerned it is cancer. The last time he was seen was 2018 and his last cancer was 2001. At the time of his last visit he was seen for lung nodules and the f/u scan showed the nodules has resolved and it was noted to be infectious area and no cancer. It was rec: that he do a yearly low dose ct scan each year. Dr. Janese Banks states that he should get worked up by PCP and then if anything comes back abnormal that he would need to see oncologist to let us know. He does have PCP Dr.  Parks Ranger and he will contact that office first

## 2018-09-13 ENCOUNTER — Telehealth: Payer: Self-pay | Admitting: Family Medicine

## 2018-09-13 DIAGNOSIS — T753XXA Motion sickness, initial encounter: Secondary | ICD-10-CM

## 2018-09-13 MED ORDER — SCOPOLAMINE 1 MG/3DAYS TD PT72: 1.0000 | MEDICATED_PATCH | TRANSDERMAL | 0 refills | Status: DC

## 2018-09-13 NOTE — Telephone Encounter (Signed)
Sent rx Scopolamine patches  Wear 1 patch for up to 3 days at a time then can replace it if need.  See rx instructions should start patch at minimum 2 hours before getting on boat, or can do 12 hours before.  Nobie Putnam, DO [facility] 09/13/2018, 3:20 PM

## 2018-09-13 NOTE — Telephone Encounter (Signed)
Pt called requesting seasick patch for pt he was going fishing Friday

## 2018-09-14 DIAGNOSIS — H2511 Age-related nuclear cataract, right eye: Secondary | ICD-10-CM | POA: Diagnosis not present

## 2018-09-14 DIAGNOSIS — Z01818 Encounter for other preprocedural examination: Secondary | ICD-10-CM | POA: Diagnosis not present

## 2018-09-14 DIAGNOSIS — H401131 Primary open-angle glaucoma, bilateral, mild stage: Secondary | ICD-10-CM | POA: Diagnosis not present

## 2018-09-14 DIAGNOSIS — H2512 Age-related nuclear cataract, left eye: Secondary | ICD-10-CM | POA: Diagnosis not present

## 2018-09-25 DIAGNOSIS — H401131 Primary open-angle glaucoma, bilateral, mild stage: Secondary | ICD-10-CM | POA: Diagnosis not present

## 2018-09-25 DIAGNOSIS — H401121 Primary open-angle glaucoma, left eye, mild stage: Secondary | ICD-10-CM | POA: Diagnosis not present

## 2018-09-25 DIAGNOSIS — H2512 Age-related nuclear cataract, left eye: Secondary | ICD-10-CM | POA: Diagnosis not present

## 2018-09-25 DIAGNOSIS — H25812 Combined forms of age-related cataract, left eye: Secondary | ICD-10-CM | POA: Diagnosis not present

## 2018-10-09 DIAGNOSIS — H409 Unspecified glaucoma: Secondary | ICD-10-CM | POA: Diagnosis not present

## 2018-10-09 DIAGNOSIS — H401131 Primary open-angle glaucoma, bilateral, mild stage: Secondary | ICD-10-CM | POA: Diagnosis not present

## 2018-10-09 DIAGNOSIS — H2511 Age-related nuclear cataract, right eye: Secondary | ICD-10-CM | POA: Diagnosis not present

## 2018-10-09 DIAGNOSIS — H401111 Primary open-angle glaucoma, right eye, mild stage: Secondary | ICD-10-CM | POA: Diagnosis not present

## 2018-10-23 ENCOUNTER — Other Ambulatory Visit: Payer: Self-pay | Admitting: Family Medicine

## 2018-10-23 DIAGNOSIS — Z8579 Personal history of other malignant neoplasms of lymphoid, hematopoietic and related tissues: Secondary | ICD-10-CM

## 2018-10-26 DIAGNOSIS — H02839 Dermatochalasis of unspecified eye, unspecified eyelid: Secondary | ICD-10-CM | POA: Diagnosis not present

## 2018-10-29 ENCOUNTER — Ambulatory Visit
Admission: EM | Admit: 2018-10-29 | Discharge: 2018-10-29 | Disposition: A | Discharge status: Home / Self Care | Payer: BC Managed Care – PPO | Attending: Family Medicine | Admitting: Family Medicine

## 2018-10-29 ENCOUNTER — Encounter: Payer: Self-pay | Admitting: Emergency Medicine

## 2018-10-29 ENCOUNTER — Ambulatory Visit (INDEPENDENT_AMBULATORY_CARE_PROVIDER_SITE_OTHER): Payer: BC Managed Care – PPO

## 2018-10-29 ENCOUNTER — Other Ambulatory Visit: Payer: Self-pay

## 2018-10-29 DIAGNOSIS — R05 Cough: Secondary | ICD-10-CM

## 2018-10-29 DIAGNOSIS — R0981 Nasal congestion: Secondary | ICD-10-CM

## 2018-10-29 DIAGNOSIS — J441 Chronic obstructive pulmonary disease with (acute) exacerbation: Secondary | ICD-10-CM | POA: Diagnosis not present

## 2018-10-29 MED ORDER — DOXYCYCLINE HYCLATE 100 MG PO TABS: 100.0000 mg | ORAL_TABLET | Freq: Two times a day (BID) | ORAL | 0 refills | Status: DC

## 2018-10-29 MED ORDER — PREDNISONE 10 MG PO TABS: ORAL_TABLET | ORAL | 0 refills | Status: DC

## 2018-10-29 NOTE — ED Triage Notes (Signed)
Pt c/o nasal congestion, runny nose, cough, and shortness of breath. Started about a week ago. Denies fever

## 2018-10-29 NOTE — ED Provider Notes (Signed)
[facility]    CSN: [id number] Arrival date & time: 10/29/18  1150      History   Chief Complaint Chief Complaint  Patient presents with   Cough   Nasal Congestion    HPI Luke [name] is a 68 y.o. male.   68 yo male with a h/o COPD presents with a c/o productive cough, congestion and shortness of breath for the past week. Has been using his inhalers. Denies any fevers, chills. Continues to smoke cigarettes.     Cough   Past Medical History:  Diagnosis Date   Anxiety    Arthritis    Carcinoma metastatic to lymph node ([place])    Cataract    right   COPD (chronic obstructive pulmonary disease) (HCC)    Depression    GERD (gastroesophageal reflux disease)    Hoarseness of voice    Personal history of tobacco use, presenting hazards to health February of 2001   Thyroid disease     Patient Active Problem List   Diagnosis Date Noted   Chronic bursitis of right shoulder 05/01/2018   Submucosal lesion of esophagus    Esophageal dysphagia    Anxiety 12/06/2017   Myalgia due to statin 09/01/2017   Pre-diabetes 08/29/2017   Centrilobular emphysema ([place]) 07/08/2017   Dysphagia, pharyngeal phase 07/08/2017   Personal history of tobacco use, presenting hazards to health 09/23/2014   Pure hypercholesterolemia 09/06/2014   Tobacco abuse 09/06/2014   Benign essential hypertension 06/15/2013   History of lymphoma 06/15/2013   Esophageal reflux 06/08/2013   Hypothyroidism 06/08/2013    Past Surgical History:  Procedure Laterality Date   BALLOON DILATION N/A 01/30/2018   Procedure: BALLOON DILATION;  Surgeon: Mauri Pole, MD;  Location: [facility] ENDOSCOPY;  Service: Endoscopy;  Laterality: N/A;   COLONOSCOPY     COLONOSCOPY WITH PROPOFOL N/A 11/29/2016   Procedure: COLONOSCOPY WITH PROPOFOL;  Surgeon: Manya Silvas, MD;  Location: [facility] ENDOSCOPY;  Service: Endoscopy;  Laterality: N/A;   COLONOSCOPY WITH PROPOFOL N/A  08/22/2017   Procedure: COLONOSCOPY WITH PROPOFOL;  Surgeon: Jonathon Bellows, MD;  Location: [facility] ENDOSCOPY;  Service: Gastroenterology;  Laterality: N/A;   ESOPHAGEAL MANOMETRY N/A 10/05/2017   Procedure: ESOPHAGEAL MANOMETRY (EM);  Surgeon: Jonathon Bellows, MD;  Location: [facility] ENDOSCOPY;  Service: Gastroenterology;  Laterality: N/A;   ESOPHAGEAL MANOMETRY N/A 01/30/2018   Procedure: ESOPHAGEAL MANOMETRY (EM);  Surgeon: Mauri Pole, MD;  Location: [facility] ENDOSCOPY;  Service: Endoscopy;  Laterality: N/A;   ESOPHAGOGASTRODUODENOSCOPY N/A 03/09/2018   Procedure: ESOPHAGOGASTRODUODENOSCOPY (EGD);  Surgeon: Milus Banister, MD;  Location: [facility] END[facility];  Service: Endoscopy;  Laterality: N/A;   ESOPHAGOGASTRODUODENOSCOPY (EGD) WITH PROPOFOL N/A 11/29/2016   Procedure: ESOPHAGOGASTRODUODENOSCOPY (EGD) WITH PROPOFOL;  Surgeon: Manya Silvas, MD;  Location: [facility] ENDOSCOPY;  Service: Endoscopy;  Laterality: N/A;   ESOPHAGOGASTRODUODENOSCOPY (EGD) WITH PROPOFOL N/A 08/22/2017   Procedure: ESOPHAGOGASTRODUODENOSCOPY (EGD) WITH PROPOFOL;  Surgeon: Jonathon Bellows, MD;  Location[facility]COPY;  Service: Gastroenterology;  Laterality: N/A;   ESOPHAGOGASTRODUODENOSCOPY (EGD) WITH PROPOFOL N/A 01/30/2018   Procedure: ESOPHAGOGASTRODUODENOSCOPY (EGD) WITH PROPOFOL;  Surgeon: Mauri Pole, MD;  Location: [facility] ENDOSCOPY;  Service: Endoscopy;  Laterality: N/A;  Mano probe to be placed during EGD   EUS N/A 03/09/2018   Procedure: UPPER ENDOSCOPIC ULTRASOUND (EUS) RADIAL;  Surgeon: Milus Banister, MD;  Location: [facility] ENDOSCOPY;  Service: Endoscopy;  Laterality: N/A;   NECK SURGERY     status post diagnosis of cancer with chemo and radiation  SUBMUCOSAL INJECTION  03/09/2018   Procedure: SUBMUCOSAL INJECTION;  Surgeon: Milus Banister, MD;  Location: [facility];  Service: Endoscopy;;   TONSILLECTOMY         Home Medications    Prior to Admission medications   Medication Sig Start Date End Date Taking?  Authorizing Provider  albuterol (PROVENTIL) (2.5 MG/3ML) 0.083% nebulizer solution Take 3 mLs (2.5 mg total) by nebulization every 2 (two) hours as needed for wheezing. Dx: copd exacerbation J44.1 07/03/17  Yes Wieting, Richard, MD  albuterol (VENTOLIN HFA) 108 (90 Base) MCG/ACT inhaler Inhale 2 puffs into the lungs every 4 (four) hours as needed for wheezing or shortness of breath. 06/28/18  Yes Karamalegos, Devonne Doughty, DO  fluticasone (FLONASE) 50 MCG/ACT nasal spray Place 2 sprays into both nostrils daily. Use for 4-6 weeks then stop and use seasonally or as needed. 03/17/18  Yes Karamalegos, Devonne Doughty, DO  levothyroxine (SYNTHROID) 88 MCG tablet TAKE 1 TABLET BY MOUTH EVERY DAY ON AN EMPTY STOMACH 07/17/18  Yes Karamalegos, Devonne Doughty, DO  meloxicam (MOBIC) 15 MG tablet TAKE 1 TABLET(15 MG) BY MOUTH DAILY 10/24/18  Yes Mikey College, NP  Multiple Vitamin (MULTIVITAMIN WITH MINERALS) TABS tablet Take 1 tablet by mouth daily. One-A-Day Multivitamin   Yes [provider]  omeprazole (PRILOSEC) 20 MG capsule Take 1 capsule (20 mg total) by mouth daily before breakfast. Patient taking differently: Take 20 mg by mouth daily before lunch.  12/06/17  Yes Karamalegos, Devonne Doughty, DO  umeclidinium-vilanterol (ANORO ELLIPTA) 62.5-25 MCG/INH AEPB Inhale 1 puff into the lungs daily. 06/28/18  Yes Karamalegos, Devonne Doughty, DO  doxycycline (VIBRA-TABS) 100 MG tablet Take 1 tablet (100 mg total) by mouth 2 (two) times daily. 10/29/18   Norval Gable, MD  predniSONE (DELTASONE) 10 MG tablet Start 60 mg po day one, then 50 mg po day two, taper by 10 mg daily until complete. 10/29/18   Norval Gable, MD  scopolamine (TRANSDERM-SCOP) 1 MG/3DAYS Place 1 patch (1.5 mg total) onto the skin every 3 (three) days. For motion sickness. Start 2 hr before onset symptoms, up to 12 hr before 09/13/18   Olin Hauser, DO    Family History Family History  Problem Relation Age of Onset   Breast cancer  Mother    Heart disease Mother    Cancer Mother        breast, back, bladder   Heart attack Father    Heart disease Father     Social History Social History   Tobacco Use   Smoking status: Current Some Day Smoker    Packs/day: 0.25    Years: 40.00    Pack years: 10.00    Types: Cigarettes   Smokeless tobacco: Never Used  Substance Use Topics   Alcohol use: No   Drug use: No     Allergies   Patient has no known allergies.   Review of Systems Review of Systems  Respiratory: Positive for cough.      Physical Exam Triage Vital Signs ED Triage Vitals  Enc Vitals Group     BP 10/29/18 1208 111/77     Pulse Rate 10/29/18 1208 76     Resp 10/29/18 1208 18     Temp 10/29/18 1208 98.4 F (36.9 C)     Temp Source 10/29/18 1208 Oral     SpO2 10/29/18 1208 93 %     Weight 10/29/18 1204 195 lb (88.5 kg)     Height 10/29/18 1204 6\' 1"  (  1.854 m)     Head Circumference --      Peak Flow --      Pain Score 10/29/18 1204 0     Pain Loc --      Pain Edu? --      Excl. in [place]? --    No data found.  Updated Vital Signs BP 111/77 (BP Location: Right Arm)    Pulse 76    Temp 98.4 F (36.9 C) (Oral)    Resp 18    Ht 6\' 1"  (1.854 m)    Wt 88.5 kg    SpO2 93%    BMI 25.73 kg/m   Visual Acuity Right Eye Distance:   Left Eye Distance:   Bilateral Distance:    Right Eye Near:   Left Eye Near:    Bilateral Near:     Physical Exam Vitals signs and nursing note reviewed.  Constitutional:      General: He is not in acute distress.    Appearance: He is not toxic-appearing or diaphoretic.  Cardiovascular:     Rate and Rhythm: Normal rate and regular rhythm.  Pulmonary:     Effort: Pulmonary effort is normal. No respiratory distress.     Breath sounds: Wheezing (diffuse, bilaterally) and rhonchi (diffuse, bilaterally) present.  Neurological:     Mental Status: He is alert.      UC Treatments / Results  Labs (all labs ordered are listed, but only abnormal  results are displayed) Labs Reviewed  NOVEL CORONAVIRUS, NAA (HOSP ORDER, SEND-OUT TO REF LAB; TAT 18-24 HRS)    EKG   Radiology Dg Chest 2 View  Result Date: 10/29/2018 CLINICAL DATA:  68 year old male with history of cough and shortness of breath. EXAM: CHEST - 2 VIEW COMPARISON:  Chest x-ray 07/31/2017. FINDINGS: Lung volumes are normal. No consolidative airspace disease. No pleural effusions. No pneumothorax. No pulmonary nodule or mass noted. Pulmonary vasculature and the cardiomediastinal silhouette are within normal limits. IMPRESSION: No radiographic evidence of acute cardiopulmonary disease. Electronically Signed   By: Vinnie Langton M.D.   On: 10/29/2018 12:55    Procedures Procedures (including critical care time)  Medications Ordered in UC Medications - No data to display  Initial Impression / Assessment and Plan / UC Course  I have reviewed the triage vital signs and the nursing notes.  Pertinent labs & imaging results that were available during my care of the patient were reviewed by me and considered in my medical decision making (see chart for details).     Final Clinical Impressions(s) / UC Diagnoses   Final diagnoses:  COPD exacerbation [facility])    ED Prescriptions    Medication Sig Dispense Auth. Provider   doxycycline (VIBRA-TABS) 100 MG tablet Take 1 tablet (100 mg total) by mouth 2 (two) times daily. 20 tablet Norval Gable, MD   predniSONE (DELTASONE) 10 MG tablet Start 60 mg po day one, then 50 mg po day two, taper by 10 mg daily until complete. 21 tablet Norval Gable, MD      1. x-ray results and diagnosis reviewed with patient 2. rx as per orders above; reviewed possible side effects, interactions, risks and benefits  3. Recommend continue current inhalers 4. covid test done 5. Follow-up prn if symptoms worsen or don't improve  PDMP not reviewed this encounter.   Norval Gable, MD 10/29/18 1316

## 2018-10-30 LAB — NOVEL CORONAVIRUS, NAA (HOSP ORDER, SEND-OUT TO REF LAB; TAT 18-24 HRS): SARS-CoV-2, NAA: NOT DETECTED

## 2018-11-13 ENCOUNTER — Other Ambulatory Visit: Payer: Self-pay | Admitting: Nurse Practitioner

## 2018-11-13 DIAGNOSIS — Z8579 Personal history of other malignant neoplasms of lymphoid, hematopoietic and related tissues: Secondary | ICD-10-CM

## 2018-11-28 DIAGNOSIS — R05 Cough: Secondary | ICD-10-CM | POA: Diagnosis not present

## 2018-11-28 DIAGNOSIS — J189 Pneumonia, unspecified organism: Secondary | ICD-10-CM | POA: Diagnosis not present

## 2018-11-28 DIAGNOSIS — Z20828 Contact with and (suspected) exposure to other viral communicable diseases: Secondary | ICD-10-CM | POA: Diagnosis not present

## 2018-11-29 ENCOUNTER — Telehealth: Payer: Self-pay | Admitting: *Deleted

## 2018-11-29 DIAGNOSIS — Z87891 Personal history of nicotine dependence: Secondary | ICD-10-CM

## 2018-11-29 NOTE — Telephone Encounter (Signed)
[name] has been notified that lung cancer screening CT scan is due currently or will be in near future. Confirmed that patient is within the appropriate age range, and asymptomatic, (no signs or symptoms of lung cancer). He mentioned that he is currently being treated for pneumonia.  Patient denies any serious illness that would prevent curative treatment for lung cancer if found. Verified smoking history. He is a currently smoking a pack per day and has been a smoker for 50 years. Patient is agreeable for CT scan being scheduled. He is available any day of the week but would prefer to be scheduled for the CT scan later in the afternoon if possible.

## 2018-12-01 NOTE — Telephone Encounter (Signed)
Contacted regarding lung screening scan. Patient verbalizes that he currently has pneumonia. Will contact in the future to schedule lung screening scan after pneumonia has resolved.

## 2018-12-11 DIAGNOSIS — H02834 Dermatochalasis of left upper eyelid: Secondary | ICD-10-CM | POA: Diagnosis not present

## 2018-12-11 DIAGNOSIS — H02831 Dermatochalasis of right upper eyelid: Secondary | ICD-10-CM | POA: Diagnosis not present

## 2018-12-28 NOTE — Addendum Note (Signed)
Addended by: Lieutenant Diego on: 12/28/2018 11:03 AM   Modules accepted: Orders

## 2018-12-28 NOTE — Telephone Encounter (Signed)
Smoking history: current, 93.25 pack year

## 2019-01-02 ENCOUNTER — Other Ambulatory Visit: Payer: Self-pay

## 2019-01-02 ENCOUNTER — Ambulatory Visit
Admission: RE | Admit: 2019-01-02 | Discharge: 2019-01-02 | Disposition: A | Discharge status: Home / Self Care | Payer: BC Managed Care – PPO | Source: Ambulatory Visit | Attending: Oncology | Admitting: Oncology

## 2019-01-02 DIAGNOSIS — Z87891 Personal history of nicotine dependence: Secondary | ICD-10-CM | POA: Diagnosis not present

## 2019-01-02 DIAGNOSIS — F17210 Nicotine dependence, cigarettes, uncomplicated: Secondary | ICD-10-CM | POA: Diagnosis not present

## 2019-01-04 ENCOUNTER — Encounter: Payer: Self-pay | Admitting: *Deleted

## 2019-01-12 ENCOUNTER — Other Ambulatory Visit: Payer: Self-pay | Admitting: Family Medicine

## 2019-01-12 DIAGNOSIS — Z8579 Personal history of other malignant neoplasms of lymphoid, hematopoietic and related tissues: Secondary | ICD-10-CM

## 2019-02-08 ENCOUNTER — Other Ambulatory Visit: Payer: Self-pay | Admitting: Family Medicine

## 2019-02-08 DIAGNOSIS — E039 Hypothyroidism, unspecified: Secondary | ICD-10-CM

## 2019-02-08 DIAGNOSIS — Z8579 Personal history of other malignant neoplasms of lymphoid, hematopoietic and related tissues: Secondary | ICD-10-CM

## 2019-02-08 DIAGNOSIS — R351 Nocturia: Secondary | ICD-10-CM

## 2019-02-08 DIAGNOSIS — E7800 Pure hypercholesterolemia, unspecified: Secondary | ICD-10-CM

## 2019-02-08 DIAGNOSIS — I10 Essential (primary) hypertension: Secondary | ICD-10-CM

## 2019-02-08 DIAGNOSIS — Z8572 Personal history of non-Hodgkin lymphomas: Secondary | ICD-10-CM

## 2019-02-08 DIAGNOSIS — R7303 Prediabetes: Secondary | ICD-10-CM

## 2019-02-08 DIAGNOSIS — Z0000 Encounter for general adult medical examination without abnormal findings: Secondary | ICD-10-CM

## 2019-02-09 ENCOUNTER — Other Ambulatory Visit: Payer: Self-pay

## 2019-02-09 ENCOUNTER — Other Ambulatory Visit: Payer: BC Managed Care – PPO

## 2019-02-09 DIAGNOSIS — I10 Essential (primary) hypertension: Secondary | ICD-10-CM

## 2019-02-09 DIAGNOSIS — E039 Hypothyroidism, unspecified: Secondary | ICD-10-CM | POA: Diagnosis not present

## 2019-02-09 DIAGNOSIS — E7800 Pure hypercholesterolemia, unspecified: Secondary | ICD-10-CM | POA: Diagnosis not present

## 2019-02-09 DIAGNOSIS — R351 Nocturia: Secondary | ICD-10-CM | POA: Diagnosis not present

## 2019-02-09 DIAGNOSIS — Z0000 Encounter for general adult medical examination without abnormal findings: Secondary | ICD-10-CM

## 2019-02-09 DIAGNOSIS — Z8579 Personal history of other malignant neoplasms of lymphoid, hematopoietic and related tissues: Secondary | ICD-10-CM

## 2019-02-09 DIAGNOSIS — R7303 Prediabetes: Secondary | ICD-10-CM

## 2019-02-10 LAB — COMPLETE METABOLIC PANEL WITH GFR
AG Ratio: 1.5 (calc) (ref 1.0–2.5)
ALT: 13 U/L (ref 9–46)
AST: 20 U/L (ref 10–35)
Albumin: 4.1 g/dL (ref 3.6–5.1)
Alkaline phosphatase (APISO): 93 U/L (ref 35–144)
BUN: 20 mg/dL (ref 7–25)
CO2: 31 mmol/L (ref 20–32)
Calcium: 9.8 mg/dL (ref 8.6–10.3)
Chloride: 102 mmol/L (ref 98–110)
Creat: 1.05 mg/dL (ref 0.70–1.25)
GFR, Est African American: 85 mL/min/{1.73_m2} (ref >=60)
GFR, Est Non African American: 74 mL/min/{1.73_m2} (ref >=60)
Globulin: 2.7 g/dL (calc) (ref 1.9–3.7)
Glucose, Bld: 118 mg/dL — ABNORMAL HIGH (ref 65–99)
Potassium: 4.7 mmol/L (ref 3.5–5.3)
Sodium: 141 mmol/L (ref 135–146)
Total Bilirubin: 0.7 mg/dL (ref 0.2–1.2)
Total Protein: 6.8 g/dL (ref 6.1–8.1)

## 2019-02-10 LAB — CBC WITH DIFFERENTIAL/PLATELET
Absolute Monocytes: 531 cells/uL (ref 200–950)
Basophils Absolute: 35 cells/uL (ref 0–200)
Basophils Relative: 0.3 %
Eosinophils Absolute: 130 cells/uL (ref 15–500)
Eosinophils Relative: 1.1 %
HCT: 43.5 % (ref 38.5–50.0)
Hemoglobin: 14.7 g/dL (ref 13.2–17.1)
Lymphs Abs: 1310 cells/uL (ref 850–3900)
MCH: 30.6 pg (ref 27.0–33.0)
MCHC: 33.8 g/dL (ref 32.0–36.0)
MCV: 90.6 fL (ref 80.0–100.0)
MPV: 11 fL (ref 7.5–12.5)
Monocytes Relative: 4.5 %
Neutro Abs: 9794 cells/uL — ABNORMAL HIGH (ref 1500–7800)
Neutrophils Relative %: 83 %
Platelets: 306 10*3/uL (ref 140–400)
RBC: 4.8 10*6/uL (ref 4.20–5.80)
RDW: 12.1 % (ref 11.0–15.0)
Total Lymphocyte: 11.1 %
WBC: 11.8 10*3/uL — ABNORMAL HIGH (ref 3.8–10.8)

## 2019-02-10 LAB — PSA: PSA: 0.4 ng/mL (ref <=4.0)

## 2019-02-10 LAB — LIPID PANEL
Cholesterol: 186 mg/dL (ref <200)
HDL: 38 mg/dL — ABNORMAL LOW (ref >=40)
LDL Cholesterol (Calc): 131 mg/dL (calc) — ABNORMAL HIGH
Non-HDL Cholesterol (Calc): 148 mg/dL (calc) — ABNORMAL HIGH (ref <130)
Total CHOL/HDL Ratio: 4.9 (calc) (ref <5.0)
Triglycerides: 75 mg/dL (ref <150)

## 2019-02-10 LAB — TSH: TSH: 3 mIU/L (ref 0.40–4.50)

## 2019-02-10 LAB — T4, FREE: Free T4: 1.2 ng/dL (ref 0.8–1.8)

## 2019-02-10 LAB — HEMOGLOBIN A1C
Hgb A1c MFr Bld: 6 % of total Hgb — ABNORMAL HIGH (ref <5.7)
Mean Plasma Glucose: 126 (calc)
eAG (mmol/L): 7 (calc)

## 2019-02-16 ENCOUNTER — Other Ambulatory Visit: Payer: Self-pay

## 2019-02-16 ENCOUNTER — Ambulatory Visit (INDEPENDENT_AMBULATORY_CARE_PROVIDER_SITE_OTHER): Payer: BC Managed Care – PPO | Admitting: Family Medicine

## 2019-02-16 ENCOUNTER — Encounter: Payer: Self-pay | Admitting: Family Medicine

## 2019-02-16 VITALS — BP 82/58 | HR 96 | Temp 97.6°F | Resp 16 | Ht 73.0 in | Wt 193.0 lb

## 2019-02-16 DIAGNOSIS — M67911 Unspecified disorder of synovium and tendon, right shoulder: Secondary | ICD-10-CM | POA: Diagnosis not present

## 2019-02-16 DIAGNOSIS — M7551 Bursitis of right shoulder: Secondary | ICD-10-CM

## 2019-02-16 DIAGNOSIS — M25511 Pain in right shoulder: Secondary | ICD-10-CM | POA: Diagnosis not present

## 2019-02-16 DIAGNOSIS — R7303 Prediabetes: Secondary | ICD-10-CM

## 2019-02-16 DIAGNOSIS — Z0000 Encounter for general adult medical examination without abnormal findings: Secondary | ICD-10-CM

## 2019-02-16 DIAGNOSIS — Z23 Encounter for immunization: Secondary | ICD-10-CM | POA: Diagnosis not present

## 2019-02-16 DIAGNOSIS — I10 Essential (primary) hypertension: Secondary | ICD-10-CM | POA: Diagnosis not present

## 2019-02-16 DIAGNOSIS — G8929 Other chronic pain: Secondary | ICD-10-CM | POA: Diagnosis not present

## 2019-02-16 DIAGNOSIS — J432 Centrilobular emphysema: Secondary | ICD-10-CM | POA: Diagnosis not present

## 2019-02-16 NOTE — Assessment & Plan Note (Signed)
Low BP, not on anti HTN therapy Known history of similar low BP No known complications     Plan:  1. Encourage improved lifestyle - continue with salt added diet, regular activity, hydration 2. Continue monitor BP outside office, bring readings to next visit, if persistently >140/90 or new symptoms notify office sooner - or notify if low BP < 90

## 2019-02-16 NOTE — Assessment & Plan Note (Addendum)
No acute flare up today Followed by [facility]  Encourage adherence to Anoro - sample given today due to issues of cost

## 2019-02-16 NOTE — Progress Notes (Signed)
Subjective:    Patient ID: Luke [name], male    DOB: Oct 01, 1950, 69 y.o.   MRN: [id number]  Luke [name] is a 69 y.o. male presenting on 02/16/2019 for Annual Exam   HPI   Here for Annual Physical and Lab Review.  Follow-upPre-Diabetes No concerns. Last sugar 6.0 (01/2019) - similar to prior readings 6.1 to 6.2 CBGs:Not checking Meds:None CurrentlynotACEi / ARB Lifestyle: - Diet (Admits not always adhering to DM diet) - Exercise (Stays active, steps at home even in isolation) Denies hypoglycemia  ChronicRight Shoulder, Pain - Last visit with me6/17/20 virtual visit by phone, thought to be bursitis vs rotator cuff given course with improvement on prednisone temporary but then worsened again, he had X-ray ordered R shoulder in interval, done on 6/18, see results below, without arthritis or fracture or dislocation or other problem - Now today here for shoulder injection. Still has same pain persistent R shoulder pain, worse provoked if lifting arm above shoulder. Admits shoulder pain wakes him up and causes radiating pain into arm, and hand, with numbness and tingling, will have sensation of numbness sometime when he wakes up - often waking him up - Has not seen Orthopedic specialist for this problem Denies other joint pain, erythema, fever chill, edema  Centrilobular Emphysema (COPD) Infrequent anoro use, using PRN, also has albuterol using PRN. No recent flare. See history of chronic dysphagia - with coughing after eat, has improved his cough technique to clear lungs, doing better overall but they are unable to resolve the issue.   Health Maintenance: Due for 2nd pneumonia vaccine >1 year after previous vaccine, now to receive Prevnar 52 today   Depression screen [facility] 2/9 02/16/2019 05/01/2018 04/14/2018  Decreased Interest 0 0 0  Down, Depressed, Hopeless 0 0 0  PHQ - 2 Score 0 0 0    Past Medical History:  Diagnosis Date  . Anxiety   . Arthritis   . Carcinoma  metastatic to lymph node ([place])   . Cataract    right  . COPD (chronic obstructive pulmonary disease) ([place])   . Depression   . GERD (gastroesophageal reflux disease)   . Hoarseness of voice   . Personal history of tobacco use, presenting hazards to health February of 2001  . Thyroid disease    Past Surgical History:  Procedure Laterality Date  . BALLOON DILATION N/A 01/30/2018   Procedure: BALLOON DILATION;  Surgeon: Mauri Pole, MD;  Location: [facility] ENDOSCOPY;  Service: Endoscopy;  Laterality: N/A;  . COLONOSCOPY    . COLONOSCOPY WITH PROPOFOL N/A 11/29/2016   Procedure: COLONOSCOPY WITH PROPOFOL;  Surgeon: Manya Silvas, MD;  Location[facility] ENDOSCOPY;  Service: Endoscopy;  Laterality: N/A;  . COLONOSCOPY WITH PROPOFOL N/A 08/22/2017   Procedure: COLONOSCOPY WITH PROPOFOL;  Surgeon: Jonathon Bellows, MD;  Location: [facility] ENDOSCOPY;  Service: Gastroenterology;  Laterality: N/A;  . ESOPHAGEAL MANOMETRY N/A 10/05/2017   Procedure: ESOPHAGEAL MANOMETRY (EM);  Surgeon: Jonathon Bellows, MD;  Location: [facility] ENDOSCOPY;  Service: Gastroenterology;  Laterality: N/A;  . ESOPHAGEAL MANOMETRY N/A 01/30/2018   Procedure: ESOPHAGEAL MANOMETRY (EM);  Surgeon: Mauri Pole, MD;  Location: [facility] ENDOSCOPY;  Service: Endoscopy;  Laterality: N/A;  . ESOPHAGOGASTRODUODENOSCOPY N/A 03/09/2018   Procedure: ESOPHAGOGASTRODUODENOSCOPY (EGD);  Surgeon: Milus Banister, MD;  Location: [facility];  Service: Endoscopy;  Laterality: N/A;  . ESOPHAGOGASTRODUODENOSCOPY (EGD) WITH PROPOFOL N/A 11/29/2016   Procedure: ESOPHAGOGASTRODUODENOSCOPY (EGD) WITH PROPOFOL;  Surgeon: Manya Silvas, MD;  Location: [facility] ENDOSCOPY;  Service:  Endoscopy;  Laterality: N/A;  . ESOPHAGOGASTRODUODENOSCOPY (EGD) WITH PROPOFOL N/A 08/22/2017   Procedure: ESOPHAGOGASTRODUODENOSCOPY (EGD) WITH PROPOFOL;  Surgeon: Jonathon Bellows, MD;  Location: [facility] ENDOSCOPY;  Service: Gastroenterology;  Laterality: N/A;  . ESOPHAGOGASTRODUODENOSCOPY (EGD)  WITH PROPOFOL N/A 01/30/2018   Procedure: ESOPHAGOGASTRODUODENOSCOPY (EGD) WITH PROPOFOL;  Surgeon: Mauri Pole, MD;  Location: [facility] ENDOSCOPY;  Service: Endoscopy;  Laterality: N/A;  Mano probe to be placed during EGD  . EUS N/A 03/09/2018   Procedure: UPPER ENDOSCOPIC ULTRASOUND (EUS) RADIAL;  Surgeon: Milus Banister, MD;  Location: [facility] ENDOSCOPY;  Service: Endoscopy;  Laterality: N/A;  . NECK SURGERY     status post diagnosis of cancer with chemo and radiation  . SUBMUCOSAL INJECTION  03/09/2018   Procedure: SUBMUCOSAL INJECTION;  Surgeon: Milus Banister, MD;  Location: [facility] ENDOSCOPY;  Service: Endoscopy;;  . TONSILLECTOMY     Social History   Socioeconomic History  . Marital status: Single    Spouse name: Not on file  . Number of children: 1  . Years of education: [employer]  . Highest education level: High school graduate  Occupational History  . Not on file  Tobacco Use  . Smoking status: Current Some Day Smoker    Packs/day: 0.25    Years: 40.00    Pack years: 10.00    Types: Cigarettes  . Smokeless tobacco: Never Used  Substance and Sexual Activity  . Alcohol use: No  . Drug use: No  . Sexual activity: Not on file  Other Topics Concern  . Not on file  Social History Narrative  . Not on file   Social Determinants of Health   Financial Resource Strain:   . Difficulty of Paying Living Expenses: Not on file  Food Insecurity:   . Worried About Charity fundraiser in the Last Year: Not on file  . Ran Out of Food in the Last Year: Not on file  Transportation Needs:   . Lack of Transportation (Medical): Not on file  . Lack of Transportation (Non-Medical): Not on file  Physical Activity:   . Days of Exercise per Week: Not on file  . Minutes of Exercise per Session: Not on file  Stress:   . Feeling of Stress : Not on file  Social Connections:   . Frequency of Communication with Friends and Family: Not on file  . Frequency of Social Gatherings with Friends and  Family: Not on file  . Attends Religious Services: Not on file  . Active Member of Clubs or Organizations: Not on file  . Attends Archivist Meetings: Not on file  . Marital Status: Not on file  Intimate Partner Violence:   . Fear of Current or Ex-Partner: Not on file  . Emotionally Abused: Not on file  . Physically Abused: Not on file  . Sexually Abused: Not on file   Family History  Problem Relation Age of Onset  . Breast cancer Mother   . Heart disease Mother   . Cancer Mother        breast, back, bladder  . Heart attack Father   . Heart disease Father    Current Outpatient Medications on File Prior to Visit  Medication Sig  . albuterol (PROVENTIL) (2.5 MG/3ML) 0.083% nebulizer solution Take 3 mLs (2.5 mg total) by nebulization every 2 (two) hours as needed for wheezing. Dx: copd exacerbation J44.1  . albuterol (VENTOLIN HFA) 108 (90 Base) MCG/ACT inhaler Inhale 2 puffs into the lungs every 4 (four)  hours as needed for wheezing or shortness of breath.  . fluticasone (FLONASE) 50 MCG/ACT nasal spray Place 2 sprays into both nostrils daily. Use for 4-6 weeks then stop and use seasonally or as needed.  Marland Kitchen levothyroxine (SYNTHROID) 88 MCG tablet TAKE 1 TABLET BY MOUTH EVERY DAY ON AN EMPTY STOMACH  . meloxicam (MOBIC) 15 MG tablet TAKE 1 TABLET(15 MG) BY MOUTH DAILY  . Multiple Vitamin (MULTIVITAMIN WITH MINERALS) TABS tablet Take 1 tablet by mouth daily. One-A-Day Multivitamin  . omeprazole (PRILOSEC) 20 MG capsule Take 1 capsule (20 mg total) by mouth daily before breakfast. (Patient taking differently: Take 20 mg by mouth daily before lunch. )  . umeclidinium-vilanterol (ANORO ELLIPTA) 62.5-25 MCG/INH AEPB Inhale 1 puff into the lungs daily.   No current facility-administered medications on file prior to visit.    Review of Systems  Constitutional: Negative for activity change, appetite change, chills, diaphoresis, fatigue and fever.  HENT: Negative for congestion and  hearing loss.   Eyes: Negative for visual disturbance.  Respiratory: Positive for cough and choking. Negative for apnea, chest tightness, shortness of breath and wheezing.   Cardiovascular: Negative for chest pain, palpitations and leg swelling.  Gastrointestinal: Negative for abdominal pain, constipation, diarrhea, nausea and vomiting.  Endocrine: Negative for cold intolerance.  Genitourinary: Negative for decreased urine volume, difficulty urinating, dysuria, frequency, hematuria, testicular pain and urgency.  Musculoskeletal: Negative for arthralgias, back pain and neck pain.  Skin: Negative for rash.  Allergic/Immunologic: Negative for environmental allergies.  Neurological: Negative for dizziness, weakness, light-headedness, numbness and headaches.  Hematological: Negative for adenopathy.  Psychiatric/Behavioral: Negative for behavioral problems, dysphoric mood and sleep disturbance. The patient is not nervous/anxious.    Per HPI unless specifically indicated above     Objective:    BP (!) 82/58   Pulse 96   Temp 97.6 F (36.4 C) (Oral)   Resp 16   Ht 6\' 1"  (1.854 m)   Wt 193 lb (87.5 kg)   SpO2 96%   BMI 25.46 kg/m   Wt Readings from Last 3 Encounters:  02/16/19 193 lb (87.5 kg)  01/02/19 195 lb (88.5 kg)  10/29/18 195 lb (88.5 kg)    Physical Exam Vitals and nursing note reviewed.  Constitutional:      General: He is not in acute distress.    Appearance: He is well-developed. He is not diaphoretic.     Comments: Well-appearing, comfortable, cooperative  HENT:     Head: Normocephalic and atraumatic.  Eyes:     General:        Right eye: No discharge.        Left eye: No discharge.     Conjunctiva/sclera: Conjunctivae normal.     Pupils: Pupils are equal, round, and reactive to light.  Neck:     Thyroid: No thyromegaly.  Cardiovascular:     Rate and Rhythm: Normal rate and regular rhythm.     Heart sounds: Normal heart sounds. No murmur.  Pulmonary:      Effort: Pulmonary effort is normal. No respiratory distress.     Breath sounds: No wheezing or rales.     Comments: Mostly clear lung sounds today. Good air movement. Appreciate some upper airway abnormal breathing at baseline. Abdominal:     General: Bowel sounds are normal. There is no distension.     Palpations: Abdomen is soft. There is no mass.     Tenderness: There is no abdominal tenderness.  Musculoskeletal:  General: No tenderness.     Cervical back: Normal range of motion and neck supple.     Comments: RIGHT Shoulder Inspection: Normal appearance bilateral symmetrical Palpation: mild tender to palpation over anterior ROM: Significantly reduced active ROM forward flexion and abduction, but intact internal rotation behind back - requires assistance of other arm to lift above shoulder Special Testing: Rotator cuff testing positive for weakness and pain, Hawkin's AC impingement positive for pain Neurovascular: Distally intact pulses, sensation to light touch  Lymphadenopathy:     Cervical: No cervical adenopathy.  Skin:    General: Skin is warm and dry.     Findings: No erythema or rash.  Neurological:     Mental Status: He is alert and oriented to person, place, and time.     Comments: Distal sensation intact to light touch all extremities  Psychiatric:        Behavior: Behavior normal.     Comments: Well groomed, good eye contact, normal speech and thoughts      Results for orders placed or performed in visit on 02/09/19  T4, free  Result Value Ref Range   Free T4 1.2 0.8 - 1.8 ng/dL  TSH  Result Value Ref Range   TSH 3.00 0.40 - 4.50 mIU/L  PSA  Result Value Ref Range   PSA 0.4 < OR = 4.0 ng/mL  Lipid panel  Result Value Ref Range   Cholesterol 186 <200 mg/dL   HDL 38 (L) > OR = 40 mg/dL   Triglycerides 75 <150 mg/dL   LDL Cholesterol (Calc) 131 (H) mg/dL (calc)   Total CHOL/HDL Ratio 4.9 <5.0 (calc)   Non-HDL Cholesterol (Calc) 148 (H) <130 mg/dL (calc)    COMPLETE METABOLIC PANEL WITH GFR  Result Value Ref Range   Glucose, Bld 118 (H) 65 - 99 mg/dL   BUN 20 7 - 25 mg/dL   Creat 1.05 0.70 - 1.25 mg/dL   GFR, Est Non African American 74 > OR = 60 mL/min/1.9m2   GFR, Est African American 85 > OR = 60 mL/min/1.58m2   BUN/Creatinine Ratio NOT APPLICABLE 6 - 22 (calc)   Sodium 141 135 - 146 mmol/L   Potassium 4.7 3.5 - 5.3 mmol/L   Chloride 102 98 - 110 mmol/L   CO2 31 20 - 32 mmol/L   Calcium 9.8 8.6 - 10.3 mg/dL   Total Protein 6.8 6.1 - 8.1 g/dL   Albumin 4.1 3.6 - 5.1 g/dL   Globulin 2.7 1.9 - 3.7 g/dL (calc)   AG Ratio 1.5 1.0 - 2.5 (calc)   Total Bilirubin 0.7 0.2 - 1.2 mg/dL   Alkaline phosphatase (APISO) 93 35 - 144 U/L   AST 20 10 - 35 U/L   ALT 13 9 - 46 U/L  CBC with Differential/Platelet  Result Value Ref Range   WBC 11.8 (H) 3.8 - 10.8 Thousand/uL   RBC 4.80 4.20 - 5.80 Million/uL   Hemoglobin 14.7 13.2 - 17.1 g/dL   HCT 43.5 38.5 - 50.0 %   MCV 90.6 80.0 - 100.0 fL   MCH 30.6 27.0 - 33.0 pg   MCHC 33.8 32.0 - 36.0 g/dL   RDW 12.1 11.0 - 15.0 %   Platelets 306 140 - 400 Thousand/uL   MPV 11.0 7.5 - 12.5 fL   Neutro Abs 9,794 (H) 1,500 - 7,800 cells/uL   Lymphs Abs 1,310 850 - 3,900 cells/uL   Absolute Monocytes 531 200 - 950 cells/uL   Eosinophils Absolute 130 15 -  500 cells/uL   Basophils Absolute 35 0 - 200 cells/uL   Neutrophils Relative % 83 %   Total Lymphocyte 11.1 %   Monocytes Relative 4.5 %   Eosinophils Relative 1.1 %   Basophils Relative 0.3 %  Hemoglobin A1c  Result Value Ref Range   Hgb A1c MFr Bld 6.0 (H) <5.7 % of total Hgb   Mean Plasma Glucose 126 (calc)   eAG (mmol/L) 7.0 (calc)    I have personally reviewed the radiology report from 06/29/18 on R Shoulder X-ray.  CLINICAL DATA:  Pain in rt shoulder laterally and throughout joint and into upper arm x 4 months, he did pick up a tv to throw in the dump around the time of onset of pain, no other prev injury or surgery, patient has  increased pain with lifting and moving  EXAM: RIGHT SHOULDER - 2+ VIEW  COMPARISON:  None.  FINDINGS: There is no evidence of fracture or dislocation. There is no evidence of arthropathy or other focal bone abnormality. Soft tissues are unremarkable.  IMPRESSION: Negative.   Electronically Signed   By: Lucrezia Europe M.D.   On: 06/30/2018 09:46    Assessment & Plan:   Problem List Items Addressed This Visit    Pre-diabetes    Stable A1c 6.0 Prior 6.1 to 6.2  Plan:  1. Not on any therapy currently  2. Encourage improved lifestyle - low carb, low sugar diet, reduce portion size, continue improving activity future regular exercise      Chronic bursitis of right shoulder   Relevant Orders   Ambulatory referral to Orthopedic Surgery   Centrilobular emphysema ([place])    No acute flare up today Followed by [facility]  Encourage adherence to Anoro - sample given today due to issues of cost      Benign essential hypertension    Low BP, not on anti HTN therapy Known history of similar low BP No known complications     Plan:  1. Encourage improved lifestyle - continue with salt added diet, regular activity, hydration 2. Continue monitor BP outside office, bring readings to next visit, if persistently >140/90 or new symptoms notify office sooner - or notify if low BP < 90       Other Visit Diagnoses    Annual physical exam    -  Primary   Need for vaccination with 13-polyvalent pneumococcal conjugate vaccine       Relevant Orders   Pneumococcal conjugate vaccine 13-valent IM (Completed)   Chronic right shoulder pain       Relevant Orders   Ambulatory referral to Orthopedic Surgery   Tendinopathy of right rotator cuff       Relevant Orders   Ambulatory referral to Orthopedic Surgery      Updated Health Maintenance information Reviewed recent lab results with patient Encouraged improvement to lifestyle with diet and exercise - Goal of weight loss   # R  Shoulder chronic pain / tendinopathy  Persistent recurrent chronic R-shoulder pain nearly 1 year now, given lack of improvement and significant weakness w/ pain and reduced ROM suspicious for rotator cuff tendinopathy and also waking him up overnight. - He did improve in interval in past on steroid/flexeril PRN also gave him steroid injection subacromial 06/2018, seems limited improvement only - X-ray now on 6/18 - see above, no arthritis or fracture, suspicious for muscle / rotator cuff soft tissue injury now based on that x-ray   Refer to Orthopedics Reason for Referral: Chronic  worsening R shoulder pain for about 1 year now, prior heavy lifting injury acutely triggered, has failed most medication management and subacromial steroid injection very limited benefit, now waking him up multiple times a night, weaker in arm unable to lift up shoulder has to use other body and other arm to raise his R shoulder, X-ray was negative 06/2018, concerning for soft tissue rotator cuff tendon injury requesting Ortho eval and likely future MRI  Referral discussed with patient: yes  Best contact number of patient for referral team: [phone] (H)  Has patient been seen by a specialist for this issue before: no  If so, who (practice/provider): n/a  Does the patient wish to return: n/a  Patient provider preference for referral: First available  Patient location preference for referral: [place]  Orders Placed This Encounter  Procedures  . Pneumococcal conjugate vaccine 13-valent IM  . Ambulatory referral to Orthopedic Surgery    Referral Priority:   Routine    Referral Type:   Surgical    Referral Reason:   Specialty Services Required    Requested Specialty:   Orthopedic Surgery    Number of Visits Requested:   1    No orders of the defined types were placed in this encounter.   Follow up plan: Return in about 6 months (around 08/16/2019) for 6 month Pre-DM A1c, COPD  follow-up.  Nobie Putnam, DO [facility] 02/16/2019, 3:23 PM

## 2019-02-16 NOTE — Assessment & Plan Note (Signed)
Stable A1c 6.0 Prior 6.1 to 6.2  Plan:  1. Not on any therapy currently  2. Encourage improved lifestyle - low carb, low sugar diet, reduce portion size, continue improving activity future regular exercise

## 2019-02-16 NOTE — Patient Instructions (Addendum)
Thank you for coming to the office today.  Stay tuned for apt from [facility] [place], [place]  [address] Phone: [phone]  For R shoulder likely need MRI and evaluation of rotator cuff  ----------- Recent Labs    04/28/18 0812 02/09/19 1003  HGBA1C 6.1* 6.0*    Sample ANoro - try to use 1 puff every day.  Prevnar13 pneumonia vaccine, final dose today.  -----------------------  [place]:  [facility]) [place] [address]  Hours: Monday - Sunday 8:00am to 12:00pm  COVID-19 Vaccines By Appointment Only  Sign up for [place] List  [email or web address] or text "VACCINE" to [phone] or call [place] [facility] questions, call [phone].  The COVID-19 Vaccine Appointment Call Center is CLOSED. The Call Center will open February 8 at 8:30am to fill appointments for February 9. Call [phone]. Healthcare Workers & Adults 75 and Older   Please schedule a Follow-up Appointment to: Return in about 6 months (around 08/16/2019) for 6 month Pre-DM A1c, COPD follow-up.  If you have any other questions or concerns, please feel free to call the office or send a message through [place]. You may also schedule an earlier appointment if necessary.  Additionally, you may be receiving a survey about your experience at our office within a few days to 1 week by e-mail or mail. We value your feedback.  Nobie Putnam, DO [place]

## 2019-02-26 ENCOUNTER — Other Ambulatory Visit: Payer: Self-pay

## 2019-02-26 ENCOUNTER — Ambulatory Visit
Admission: EM | Admit: 2019-02-26 | Discharge: 2019-02-26 | Disposition: A | Discharge status: Home / Self Care | Payer: BC Managed Care – PPO | Attending: Family Medicine | Admitting: Family Medicine

## 2019-02-26 DIAGNOSIS — B349 Viral infection, unspecified: Secondary | ICD-10-CM | POA: Diagnosis not present

## 2019-02-26 DIAGNOSIS — Z8249 Family history of ischemic heart disease and other diseases of the circulatory system: Secondary | ICD-10-CM | POA: Insufficient documentation

## 2019-02-26 DIAGNOSIS — M75101 Unspecified rotator cuff tear or rupture of right shoulder, not specified as traumatic: Secondary | ICD-10-CM | POA: Diagnosis not present

## 2019-02-26 DIAGNOSIS — M25511 Pain in right shoulder: Secondary | ICD-10-CM | POA: Diagnosis not present

## 2019-02-26 DIAGNOSIS — R110 Nausea: Secondary | ICD-10-CM | POA: Insufficient documentation

## 2019-02-26 DIAGNOSIS — G8929 Other chronic pain: Secondary | ICD-10-CM | POA: Diagnosis not present

## 2019-02-26 DIAGNOSIS — M47812 Spondylosis without myelopathy or radiculopathy, cervical region: Secondary | ICD-10-CM | POA: Diagnosis not present

## 2019-02-26 DIAGNOSIS — U071 COVID-19: Secondary | ICD-10-CM | POA: Diagnosis not present

## 2019-02-26 DIAGNOSIS — Z79899 Other long term (current) drug therapy: Secondary | ICD-10-CM | POA: Insufficient documentation

## 2019-02-26 DIAGNOSIS — F17210 Nicotine dependence, cigarettes, uncomplicated: Secondary | ICD-10-CM | POA: Insufficient documentation

## 2019-02-26 DIAGNOSIS — Z803 Family history of malignant neoplasm of breast: Secondary | ICD-10-CM | POA: Diagnosis not present

## 2019-02-26 DIAGNOSIS — R0981 Nasal congestion: Secondary | ICD-10-CM | POA: Diagnosis not present

## 2019-02-26 MED ORDER — PREDNISONE 20 MG PO TABS: 20.0000 mg | ORAL_TABLET | Freq: Every day | ORAL | 0 refills | Status: DC

## 2019-02-26 MED ORDER — ONDANSETRON 8 MG PO TBDP: 8.0000 mg | ORAL_TABLET | Freq: Three times a day (TID) | ORAL | 0 refills | Status: DC | PRN

## 2019-02-26 NOTE — ED Provider Notes (Signed)
[facility]    CSN: [id number] Arrival date & time: 02/26/19  1624      History   Chief Complaint Chief Complaint  Patient presents with  . Nasal Congestion  . Fever    HPI Luke [name] is a 69 y.o. male.   69 yo male with a c/o low grade fevers, nausea and nasal congestion for the last 3 days. Denies any vomiting, diarrhea, sore throat. States his cough is his normal, chronic cough from COPD and has not changed. No known sick contacts.    Fever   Past Medical History:  Diagnosis Date  . Anxiety   . Arthritis   . Carcinoma metastatic to lymph node ([place])   . Cataract    right  . COPD (chronic obstructive pulmonary disease) ([place])   . Depression   . GERD (gastroesophageal reflux disease)   . Hoarseness of voice   . Personal history of tobacco use, presenting hazards to health February of 2001  . Thyroid disease     Patient Active Problem List   Diagnosis Date Noted  . Chronic bursitis of right shoulder 05/01/2018  . Submucosal lesion of esophagus   . Esophageal dysphagia   . Anxiety 12/06/2017  . Myalgia due to statin 09/01/2017  . Pre-diabetes 08/29/2017  . Centrilobular emphysema ([place]) 07/08/2017  . Dysphagia, pharyngeal phase 07/08/2017  . Personal history of tobacco use, presenting hazards to health 09/23/2014  . Pure hypercholesterolemia 09/06/2014  . Tobacco abuse 09/06/2014  . Benign essential hypertension 06/15/2013  . History of lymphoma 06/15/2013  . Esophageal reflux 06/08/2013  . Hypothyroidism 06/08/2013    Past Surgical History:  Procedure Laterality Date  . BALLOON DILATION N/A 01/30/2018   Procedure: BALLOON DILATION;  Surgeon: Mauri Pole, MD;  Location: [facility] ENDOSCOPY;  Service: Endoscopy;  Laterality: N/A;  . COLONOSCOPY    . COLONOSCOPY WITH PROPOFOL N/A 11/29/2016   Procedure: COLONOSCOPY WITH PROPOFOL;  Surgeon: Manya Silvas, MD;  Location: [facility] ENDOSCOPY;  Service: Endoscopy;  Laterality: N/A;  .  COLONOSCOPY WITH PROPOFOL N/A 08/22/2017   Procedure: COLONOSCOPY WITH PROPOFOL;  Surgeon: Jonathon Bellows, MD;  Location: [facility] ENDOSCOPY;  Service: Gastroenterology;  Laterality: N/A;  . ESOPHAGEAL MANOMETRY N/A 10/05/2017   Procedure: ESOPHAGEAL MANOMETRY (EM);  Surgeon: Jonathon Bellows, MD;  Location: [facility] ENDOSCOPY;  Service: Gastroenterology;  Laterality: N/A;  . ESOPHAGEAL MANOMETRY N/A 01/30/2018   Procedure: ESOPHAGEAL MANOMETRY (EM);  Surgeon: Mauri Pole, MD;  Location: [facility] ENDOSCOPY;  Service: Endoscopy;  Laterality: N/A;  . ESOPHAGOGASTRODUODENOSCOPY N/A 03/09/2018   Procedure: ESOPHAGOGASTRODUODENOSCOPY (EGD);  Surgeon: Milus Banister, MD;  Location: [facility];  Service: Endoscopy;  Laterality: N/A;  . ESOPHAGOGASTRODUODENOSCOPY (EGD) WITH PROPOFOL N/A 11/29/2016   Procedure: ESOPHAGOGASTRODUODENOSCOPY (EGD) WITH PROPOFOL;  Surgeon: Manya Silvas, MD;  Location: [facility] ENDOSCOPY;  Service: Endoscopy;  Laterality: N/A;  . ESOPHAGOGASTRODUODENOSCOPY (EGD) WITH PROPOFOL N/A 08/22/2017   Procedure: ESOPHAGOGASTRODUODENOSCOPY (EGD) WITH PROPOFOL;  Surgeon: Jonathon Bellows, MD;  Location: [facility] ENDOSCOPY;  Service: Gastroenterology;  Laterality: N/A;  . ESOPHAGOGASTRODUODENOSCOPY (EGD) WITH PROPOFOL N/A 01/30/2018   Procedure: ESOPHAGOGASTRODUODENOSCOPY (EGD) WITH PROPOFOL;  Surgeon: Mauri Pole, MD;  Location: [facility] ENDOSCOPY;  Service: Endoscopy;  Laterality: N/A;  Mano probe to be placed during EGD  . EUS N/A 03/09/2018   Procedure: UPPER ENDOSCOPIC ULTRASOUND (EUS) RADIAL;  Surgeon: Milus Banister, MD;  Location: [facility] ENDOSCOPY;  Service: Endoscopy;  Laterality: N/A;  . NECK SURGERY     status post diagnosis of cancer  with chemo and radiation  . SUBMUCOSAL INJECTION  03/09/2018   Procedure: SUBMUCOSAL INJECTION;  Surgeon: Milus Banister, MD;  Location: [facility] ENDOSCOPY;  Service: Endoscopy;;  . TONSILLECTOMY         Home Medications    Prior to Admission medications   Medication  Sig Start Date End Date Taking? Authorizing Provider  albuterol (PROVENTIL) (2.5 MG/3ML) 0.083% nebulizer solution Take 3 mLs (2.5 mg total) by nebulization every 2 (two) hours as needed for wheezing. Dx: copd exacerbation J44.1 07/03/17   Loletha Grayer, MD  albuterol (VENTOLIN HFA) 108 (90 Base) MCG/ACT inhaler Inhale 2 puffs into the lungs every 4 (four) hours as needed for wheezing or shortness of breath. 06/28/18   Karamalegos, Devonne Doughty, DO  fluticasone (FLONASE) 50 MCG/ACT nasal spray Place 2 sprays into both nostrils daily. Use for 4-6 weeks then stop and use seasonally or as needed. 03/17/18   Karamalegos, Devonne Doughty, DO  levothyroxine (SYNTHROID) 88 MCG tablet TAKE 1 TABLET BY MOUTH EVERY DAY ON AN EMPTY STOMACH 01/15/19   Parks Ranger, Devonne Doughty, DO  meloxicam (MOBIC) 15 MG tablet TAKE 1 TABLET(15 MG) BY MOUTH DAILY 11/13/18   Parks Ranger, Devonne Doughty, DO  Multiple Vitamin (MULTIVITAMIN WITH MINERALS) TABS tablet Take 1 tablet by mouth daily. One-A-Day Multivitamin    [provider]  omeprazole (PRILOSEC) 20 MG capsule Take 1 capsule (20 mg total) by mouth daily before breakfast. Patient taking differently: Take 20 mg by mouth daily before lunch.  12/06/17   Karamalegos, Devonne Doughty, DO  ondansetron (ZOFRAN ODT) 8 MG disintegrating tablet Take 1 tablet (8 mg total) by mouth every 8 (eight) hours as needed. 02/26/19   Norval Gable, MD  predniSONE (DELTASONE) 20 MG tablet Take 1 tablet (20 mg total) by mouth daily. 02/26/19   Norval Gable, MD  umeclidinium-vilanterol (ANORO ELLIPTA) 62.5-25 MCG/INH AEPB Inhale 1 puff into the lungs daily. 06/28/18   Olin Hauser, DO    Family History Family History  Problem Relation Age of Onset  . Breast cancer Mother   . Heart disease Mother   . Cancer Mother        breast, back, bladder  . Heart attack Father   . Heart disease Father     Social History Social History   Tobacco Use  . Smoking status: Current Some Day  Smoker    Packs/day: 0.50    Years: 40.00    Pack years: 20.00    Types: Cigarettes  . Smokeless tobacco: Never Used  Substance Use Topics  . Alcohol use: No  . Drug use: No     Allergies   Patient has no known allergies.   Review of Systems Review of Systems  Constitutional: Positive for fever.     Physical Exam Triage Vital Signs ED Triage Vitals  Enc Vitals Group     BP 02/26/19 1642 116/85     Pulse Rate 02/26/19 1642 81     Resp 02/26/19 1642 18     Temp 02/26/19 1642 98.1 F (36.7 C)     Temp Source 02/26/19 1642 Oral     SpO2 02/26/19 1642 96 %     Weight 02/26/19 1644 193 lb (87.5 kg)     Height 02/26/19 1644 6\' 1"  (1.854 m)     Head Circumference --      Peak Flow --      Pain Score 02/26/19 1644 0     Pain Loc --      Pain  Edu? --      Excl. in [place]? --    No data found.  Updated Vital Signs BP 116/85 (BP Location: Right Arm)   Pulse 81   Temp 98.1 F (36.7 C) (Oral)   Resp 18   Ht 6\' 1"  (1.854 m)   Wt 87.5 kg   SpO2 96%   BMI 25.46 kg/m   Visual Acuity Right Eye Distance:   Left Eye Distance:   Bilateral Distance:    Right Eye Near:   Left Eye Near:    Bilateral Near:     Physical Exam Vitals and nursing note reviewed.  Constitutional:      General: He is not in acute distress.    Appearance: He is not toxic-appearing or diaphoretic.  HENT:     Nose: Congestion present.  Cardiovascular:     Rate and Rhythm: Normal rate.     Heart sounds: Normal heart sounds.  Pulmonary:     Effort: Pulmonary effort is normal.     Breath sounds: No stridor. Wheezing (few, diffuse) present. No rhonchi or rales.  Neurological:     Mental Status: He is alert.      UC Treatments / Results  Labs (all labs ordered are listed, but only abnormal results are displayed) Labs Reviewed  NOVEL CORONAVIRUS, NAA (HOSP ORDER, SEND-OUT TO REF LAB; TAT 18-24 HRS)    EKG   Radiology No results found.  Procedures Procedures (including critical care  time)  Medications Ordered in UC Medications - No data to display  Initial Impression / Assessment and Plan / UC Course  I have reviewed the triage vital signs and the nursing notes.  Pertinent labs & imaging results that were available during my care of the patient were reviewed by me and considered in my medical decision making (see chart for details).      Final Clinical Impressions(s) / UC Diagnoses   Final diagnoses:  Nasal congestion  Nausea without vomiting  Viral illness     Discharge Instructions     Rest, fluids, continue current inhaler    ED Prescriptions    Medication Sig Dispense Auth. Provider   ondansetron (ZOFRAN ODT) 8 MG disintegrating tablet Take 1 tablet (8 mg total) by mouth every 8 (eight) hours as needed. 6 tablet Norval Gable, MD   predniSONE (DELTASONE) 20 MG tablet Take 1 tablet (20 mg total) by mouth daily. 5 tablet Norval Gable, MD      1. diagnosis reviewed with patient 2. rx as per orders above; reviewed possible side effects, interactions, risks and benefits  3. Recommend supportive treatment as above  4. Follow-up prn if symptoms worsen or don't improve   PDMP not reviewed this encounter.   Norval Gable, MD 02/26/19 1719

## 2019-02-26 NOTE — ED Triage Notes (Signed)
Pt with low grade fever, nausea, and nasal congestion starting on Saturday.

## 2019-02-26 NOTE — Discharge Instructions (Signed)
Rest, fluids, continue current inhaler

## 2019-02-27 ENCOUNTER — Other Ambulatory Visit: Payer: Self-pay | Admitting: Physician Assistant

## 2019-02-27 DIAGNOSIS — J449 Chronic obstructive pulmonary disease, unspecified: Secondary | ICD-10-CM

## 2019-02-27 DIAGNOSIS — U071 COVID-19: Secondary | ICD-10-CM

## 2019-02-27 DIAGNOSIS — I10 Essential (primary) hypertension: Secondary | ICD-10-CM

## 2019-02-27 LAB — NOVEL CORONAVIRUS, NAA (HOSP ORDER, SEND-OUT TO REF LAB; TAT 18-24 HRS): SARS-CoV-2, NAA: DETECTED — AB

## 2019-02-27 NOTE — Progress Notes (Signed)
  I connected by phone with Luke [name] on 02/27/2019 at 5:16 PM to discuss the potential use of an new treatment for mild to moderate COVID-19 viral infection in non-hospitalized patients.  This patient is a 69 y.o. male that meets the FDA criteria for Emergency Use Authorization of bamlanivimab or casirivimab\imdevimab.  Has a (+) direct SARS-CoV-2 viral test result  Has mild or moderate COVID-19   Is ? 67 years of age and weighs ? 40 kg  Is NOT hospitalized due to COVID-19  Is NOT requiring oxygen therapy or requiring an increase in baseline oxygen flow rate due to COVID-19  Is within 10 days of symptom onset  Has at least one of the high risk factor(s) for progression to severe COVID-19 and/or hospitalization as defined in EUA.  Specific high risk criteria : >/= 67 yo   I have spoken and communicated the following to the patient or parent/caregiver:  1. FDA has authorized the emergency use of bamlanivimab and casirivimab\imdevimab for the treatment of mild to moderate COVID-19 in adults and pediatric patients with positive results of direct SARS-CoV-2 viral testing who are 62 years of age and older weighing at least 40 kg, and who are at high risk for progressing to severe COVID-19 and/or hospitalization.  2. The significant known and potential risks and benefits of bamlanivimab and casirivimab\imdevimab, and the extent to which such potential risks and benefits are unknown.  3. Information on available alternative treatments and the risks and benefits of those alternatives, including clinical trials.  4. Patients treated with bamlanivimab and casirivimab\imdevimab should continue to self-isolate and use infection control measures (e.g., wear mask, isolate, social distance, avoid sharing personal items, clean and disinfect "high touch" surfaces, and frequent handwashing) according to CDC guidelines.   5. The patient or parent/caregiver has the option to accept or refuse  bamlanivimab or casirivimab\imdevimab .  After reviewing this information with the patient, The patient agreed to proceed with receiving the bamlanimivab infusion and will be provided a copy of the Fact sheet prior to receiving the infusion.   Pt is set up for tomorrow 02/28/19 @ 10:30am. Sx onset 2/13. Directions given and orders in. My chart message sent.   Angelena Form PA-C 02/27/2019 5:16 PM   ,

## 2019-02-28 ENCOUNTER — Ambulatory Visit (HOSPITAL_COMMUNITY)
Admission: RE | Admit: 2019-02-28 | Discharge: 2019-02-28 | Disposition: A | Discharge status: Home / Self Care | Payer: BC Managed Care – PPO | Source: Ambulatory Visit | Attending: Pulmonary Disease | Admitting: Pulmonary Disease

## 2019-02-28 DIAGNOSIS — U071 COVID-19: Secondary | ICD-10-CM | POA: Diagnosis not present

## 2019-02-28 DIAGNOSIS — J449 Chronic obstructive pulmonary disease, unspecified: Secondary | ICD-10-CM | POA: Insufficient documentation

## 2019-02-28 DIAGNOSIS — I10 Essential (primary) hypertension: Secondary | ICD-10-CM | POA: Diagnosis not present

## 2019-02-28 MED ORDER — EPINEPHRINE 0.3 MG/0.3ML IJ SOAJ: 0.3000 mg | Freq: Once | INTRAMUSCULAR | Status: DC | PRN

## 2019-02-28 MED ORDER — SODIUM CHLORIDE 0.9 % IV SOLN
700.0000 mg | Freq: Once | INTRAVENOUS | Status: AC
  Administered 2019-02-28: 700 mg via INTRAVENOUS
  Filled 2019-02-28: qty 20

## 2019-02-28 MED ORDER — ALBUTEROL SULFATE HFA 108 (90 BASE) MCG/ACT IN AERS: 2.0000 | INHALATION_SPRAY | Freq: Once | RESPIRATORY_TRACT | Status: DC | PRN

## 2019-02-28 MED ORDER — DIPHENHYDRAMINE HCL 50 MG/ML IJ SOLN: 50.0000 mg | Freq: Once | INTRAMUSCULAR | Status: DC | PRN

## 2019-02-28 MED ORDER — FAMOTIDINE IN NACL 20-0.9 MG/50ML-% IV SOLN: 20.0000 mg | Freq: Once | INTRAVENOUS | Status: DC | PRN

## 2019-02-28 MED ORDER — METHYLPREDNISOLONE SODIUM SUCC 125 MG IJ SOLR: 125.0000 mg | Freq: Once | INTRAMUSCULAR | Status: DC | PRN

## 2019-02-28 MED ORDER — SODIUM CHLORIDE 0.9 % IV SOLN
INTRAVENOUS | Status: DC | PRN
  Administered 2019-02-28: 250 mL via INTRAVENOUS

## 2019-02-28 NOTE — Discharge Instructions (Signed)
What types of side effects do monoclonal antibody drugs cause?  °Common side effects ° °In general, the more common side effects caused by monoclonal antibody drugs include: °• Allergic reactions, such as hives or itching °• Flu-like signs and symptoms, including chills, fatigue, fever, and muscle aches and pains °• Nausea, vomiting °• Diarrhea °• Skin rashes °• Low blood pressure ° ° °The CDC is recommending patients who receive monoclonal antibody treatments wait at least 90 days before being vaccinated. ° °Currently, there are no data on the safety and efficacy of mRNA COVID-19 vaccines in persons who received monoclonal antibodies or convalescent plasma as part of COVID-19 treatment. Based on the estimated half-life of such therapies as well as evidence suggesting that reinfection is uncommon in the 90 days after initial infection, vaccination should be deferred for at least 90 days, as a precautionary measure until additional information becomes available, to avoid interference of the antibody treatment with vaccine-induced immune responses. ° ° °10 Things You Can Do to Manage Your COVID-19 Symptoms at Home °If you have possible or confirmed COVID-19: °1. Stay home from work and school. And stay away from other public places. If you must go out, avoid using any kind of public transportation, ridesharing, or taxis. °2. Monitor your symptoms carefully. If your symptoms get worse, call your healthcare provider immediately. °3. Get rest and stay hydrated. °4. If you have a medical appointment, call the healthcare provider ahead of time and tell them that you have or may have COVID-19. °5. For medical emergencies, call 911 and notify the dispatch personnel that you have or may have COVID-19. °6. Cover your cough and sneezes with a tissue or use the inside of your elbow. °7. Wash your hands often with soap and water for at least 20 seconds or clean your hands with an alcohol-based hand sanitizer that contains at  least 60% alcohol. °8. As much as possible, stay in a specific room and away from other people in your home. Also, you should use a separate bathroom, if available. If you need to be around other people in or outside of the home, wear a mask. °9. Avoid sharing personal items with other people in your household, like dishes, towels, and bedding. °10. Clean all surfaces that are touched often, like counters, tabletops, and doorknobs. Use household cleaning sprays or wipes according to the label instructions. °cdc.gov/coronavirus °07/12/2018 °This information is not intended to replace advice given to you by your health care provider. Make sure you discuss any questions you have with your health care provider. °Document Revised: 12/14/2018 Document Reviewed: 12/14/2018 °Elsevier Patient Education © 2020 Elsevier Inc. °What types of side effects do monoclonal antibody drugs cause?  °Common side effects ° °In general, the more common side effects caused by monoclonal antibody drugs include: °• Allergic reactions, such as hives or itching °• Flu-like signs and symptoms, including chills, fatigue, fever, and muscle aches and pains °• Nausea, vomiting °• Diarrhea °• Skin rashes °• Low blood pressure ° ° °The CDC is recommending patients who receive monoclonal antibody treatments wait at least 90 days before being vaccinated. ° °Currently, there are no data on the safety and efficacy of mRNA COVID-19 vaccines in persons who received monoclonal antibodies or convalescent plasma as part of COVID-19 treatment. Based on the estimated half-life of such therapies as well as evidence suggesting that reinfection is uncommon in the 90 days after initial infection, vaccination should be deferred for at least 90 days, as a precautionary measure until   additional information becomes available, to avoid interference of the antibody treatment with vaccine-induced immune responses. °

## 2019-02-28 NOTE — Progress Notes (Signed)
  Diagnosis: COVID-19  Physician: Dr. Joya Gaskins  Procedure: Covid Infusion Clinic Med: bamlanivimab infusion - Provided patient with bamlanimivab fact sheet for patients, parents and caregivers prior to infusion.  Complications: No immediate complications noted.  Discharge: Discharged home   Babs Sciara 02/28/2019

## 2019-03-13 ENCOUNTER — Other Ambulatory Visit: Payer: Self-pay | Admitting: Family Medicine

## 2019-03-13 DIAGNOSIS — Z8579 Personal history of other malignant neoplasms of lymphoid, hematopoietic and related tissues: Secondary | ICD-10-CM

## 2019-03-14 ENCOUNTER — Encounter: Payer: Self-pay | Admitting: Family Medicine

## 2019-03-14 ENCOUNTER — Other Ambulatory Visit: Payer: Self-pay

## 2019-03-14 ENCOUNTER — Ambulatory Visit (INDEPENDENT_AMBULATORY_CARE_PROVIDER_SITE_OTHER): Payer: BC Managed Care – PPO | Admitting: Family Medicine

## 2019-03-14 VITALS — Wt 193.0 lb

## 2019-03-14 DIAGNOSIS — U071 COVID-19: Secondary | ICD-10-CM | POA: Diagnosis not present

## 2019-03-14 DIAGNOSIS — J441 Chronic obstructive pulmonary disease with (acute) exacerbation: Secondary | ICD-10-CM | POA: Diagnosis not present

## 2019-03-14 DIAGNOSIS — J432 Centrilobular emphysema: Secondary | ICD-10-CM | POA: Diagnosis not present

## 2019-03-14 MED ORDER — PREDNISONE 20 MG PO TABS: ORAL_TABLET | ORAL | 0 refills | Status: DC

## 2019-03-14 NOTE — Patient Instructions (Addendum)
Use albuterol nebulizer every 2 to 4 hours regularly now  Start prednisone steroid as prescribed  predniSONE (DELTASONE) 20 MG tablet              Take daily with food. Start with 40mg  (2 pills) x 4 days, then reduce to 20mg  (1 pills) x 3 days, Normal   Call us to schedule for evening clinic [phone] Monday / Weds / Friday if not improving  Please schedule a Follow-up Appointment to: Return in about 1 week (around 03/21/2019), or if symptoms worsen or fail to improve, for covid if not improved.  If you have any other questions or concerns, please feel free to call the office or send a message through [place]. You may also schedule an earlier appointment if necessary.  Additionally, you may be receiving a survey about your experience at our office within a few days to 1 week by e-mail or mail. We value your feedback.  Nobie Putnam, DO [place]

## 2019-03-14 NOTE — Progress Notes (Signed)
Virtual Visit via Telephone The purpose of this virtual visit is to provide medical care while limiting exposure to the novel coronavirus (COVID19) for both patient and office staff.  Consent was obtained for phone visit:  Yes.   Answered questions that patient had about telehealth interaction:  Yes.   I discussed the limitations, risks, security and privacy concerns of performing an evaluation and management service by telephone. I also discussed with the patient that there may be a patient responsible charge related to this service. The patient expressed understanding and agreed to proceed.  Patient Location: Home Provider Location: [facility])   ---------------------------------------------------------------------- Chief Complaint  Patient presents with  . Cough    joint pain, weakness, knee and ankle pain ,sore throat, no energy , nasal congestion, denies fever was positive for Covid on 02/15    S: Reviewed CMA documentation. I have called patient and gathered additional HPI as follows:  COVID19 Positive Reports first diagnosed [facility], diagnosed positive COVID19, given prednisone course, and also completed IV antibody infusion on 02/28/19 with significant improvement. - Now worsening 2 days with Left ear pain and Left sore throat. Also worsening reduced energy for past 1 week - Admits congestion, sinuses, coughing productive, shortness of breath both at rest and with activity. - Using Anoro daily. Albuterol inhaler daily. And Albuterol nebulizer every other day PRN with some relief.  Centrilobular Emphysema (COPD) See history of chronic dysphagia - with coughing after eat, has improved his cough technique to clear lungs, doing better overall but they are unable to resolve the issue. Now using Anoro more regularly but not using albuterol much  He is out of work since today, cannot tolerate working due to fatigue dyspnea  Patient  currently out of work Denies any high risk travel to areas of current concern for [place]. Denies any known or suspected exposure to person with or possibly with COVID19.  Admits fevers chills body ache cough shortness of breath Denies any body ache, headache, abdominal pain, diarrhea  -------------------------------------------------------------------------- O: No physical exam performed due to remote telephone encounter.  -------------------------------------------------------------------------- A&P:   COVID19 Persistent symptoms, fever, cough, fatigue, dyspnea Complicated by COPD, question if AECOPD Positive COVID 02/26/19. Now >14 days but still significant worse symptoms Improved on prednisone / IV antibody infusion 2/17 Not adequately using albuterol at home  Re order prednisone, now longer taper 40mg  x daily for 4 days then 20mg  x 3 days Increase Albuterol nebulizer frequency Work note, out weds - Fri, and return Monday if improved Precautions given - advised when to go to hospital / UC / [facility] after hours [phone] if not improved can do Chest X-ray   No orders of the defined types were placed in this encounter.   REQUIRED self quarantine to [place] - advised to avoid all exposure with others while during treatment, up to 21 days as long as febrile and symptomatic    If symptoms do not resolve or significantly improve OR if WORSENING - fever / cough - or worsening shortness of breath - then should contact us and seek advice on next steps in treatment at home vs where/when to seek care at Urgent Care or Hospital ED for further intervention and possible testing if indicated.  Patient verbalizes understanding with the above medical recommendations including the limitation of remote medical advice.  Specific follow-up / call-back criteria were given for patient to follow-up or seek medical care more urgently if needed.   -  Time spent in direct  consultation with patient on phone: 12 minutes   Nobie Putnam, [facility] 03/14/2019, 2:56 PM

## 2019-03-20 ENCOUNTER — Telehealth: Payer: Self-pay | Admitting: Internal Medicine

## 2019-03-20 ENCOUNTER — Telehealth: Payer: Self-pay | Admitting: Family Medicine

## 2019-03-20 DIAGNOSIS — M25511 Pain in right shoulder: Secondary | ICD-10-CM | POA: Diagnosis not present

## 2019-03-20 DIAGNOSIS — G8929 Other chronic pain: Secondary | ICD-10-CM | POA: Diagnosis not present

## 2019-03-20 NOTE — Telephone Encounter (Signed)
Called and spoke to pt's sister, Katie(DPR). [name] stated that pt was recently dx with covid and was given steroids by PCP, however pt's voice hoariness, weakness and increased sob has worsen. Pt will complete course of prednisone this week.  I have spoken to Dr. Patsey Berthold, who recommends ED for eval. [name] is aware and voiced her understanding.  Nothing further is needed.

## 2019-03-20 NOTE — Telephone Encounter (Signed)
If he is not improving after Prednisone, then I am concerned he will need more aggressive treatment and testing.  I do not feel like the respiratory clinic after hours tomorrow on Weds will have much else to offer him.  He does need Chest X-ray imaging, but my recommendation is to go to Urgent Care for X-ray at [facility] for further management.  If needed he may be candidate to be sent to hospital ED if too severe. Based on oxygen and other evaluation.  Only if he refuses or declines to go to Urgent Care or if cannot - then I would offer a STAT Chest X-ray here at our office, recommend during lunch time hour so that limited patient contact, especially if still having fevers. If X-ray is abnormal or even if it doesn't show [place] the exact answer, then he will still likely need more aggressive treatment than I can offer, so even after X-ray he still may have to go to Urgent Care or Hospital.  He also has lung doctors at [place], and they may need to get involved as well.  Luke [name], [facility] 03/20/2019, 10:43 AM

## 2019-03-20 NOTE — Telephone Encounter (Signed)
Pt. Sister called said that pt was not any better wanted to know if he could come to the after hour  Clinic. Pt  Call back # is [phone]

## 2019-03-20 NOTE — Telephone Encounter (Addendum)
Called patient he still has exhaustion, knee and HIP pain, worst SOB last week but has little improvement this week, he checked his pulse ox it was 97%, Patient advised as per Dr. Raliegh Ip he will go to urgent care and call his [facility] as well number given to the patient.

## 2019-03-21 ENCOUNTER — Other Ambulatory Visit: Payer: Self-pay

## 2019-03-21 ENCOUNTER — Encounter: Payer: Self-pay | Admitting: Emergency Medicine

## 2019-03-21 ENCOUNTER — Emergency Department
Admission: EM | Admit: 2019-03-21 | Discharge: 2019-03-21 | Disposition: A | Discharge status: Home / Self Care | Payer: BC Managed Care – PPO | Attending: Emergency Medicine | Admitting: Emergency Medicine

## 2019-03-21 DIAGNOSIS — E039 Hypothyroidism, unspecified: Secondary | ICD-10-CM | POA: Insufficient documentation

## 2019-03-21 DIAGNOSIS — B349 Viral infection, unspecified: Secondary | ICD-10-CM | POA: Diagnosis not present

## 2019-03-21 DIAGNOSIS — I10 Essential (primary) hypertension: Secondary | ICD-10-CM | POA: Diagnosis not present

## 2019-03-21 DIAGNOSIS — R5383 Other fatigue: Secondary | ICD-10-CM | POA: Diagnosis not present

## 2019-03-21 DIAGNOSIS — J449 Chronic obstructive pulmonary disease, unspecified: Secondary | ICD-10-CM | POA: Diagnosis not present

## 2019-03-21 DIAGNOSIS — E860 Dehydration: Secondary | ICD-10-CM

## 2019-03-21 DIAGNOSIS — Z79899 Other long term (current) drug therapy: Secondary | ICD-10-CM | POA: Insufficient documentation

## 2019-03-21 DIAGNOSIS — R42 Dizziness and giddiness: Secondary | ICD-10-CM | POA: Diagnosis not present

## 2019-03-21 DIAGNOSIS — F17210 Nicotine dependence, cigarettes, uncomplicated: Secondary | ICD-10-CM | POA: Diagnosis not present

## 2019-03-21 DIAGNOSIS — G933 Postviral fatigue syndrome: Secondary | ICD-10-CM

## 2019-03-21 DIAGNOSIS — G9331 Postviral fatigue syndrome: Secondary | ICD-10-CM

## 2019-03-21 LAB — BASIC METABOLIC PANEL
Anion gap: 10 (ref 5–15)
BUN: 29 mg/dL — ABNORMAL HIGH (ref 8–23)
CO2: 27 mmol/L (ref 22–32)
Calcium: 9.6 mg/dL (ref 8.9–10.3)
Chloride: 103 mmol/L (ref 98–111)
Creatinine, Ser: 1.3 mg/dL — ABNORMAL HIGH (ref 0.61–1.24)
GFR calc Af Amer: >60 mL/min (ref >60)
GFR calc non Af Amer: 57 mL/min — ABNORMAL LOW (ref >60)
Glucose, Bld: 97 mg/dL (ref 70–99)
Potassium: 4.2 mmol/L (ref 3.5–5.1)
Sodium: 140 mmol/L (ref 135–145)

## 2019-03-21 LAB — URINALYSIS, COMPLETE (UACMP) WITH MICROSCOPIC
Bacteria, UA: NONE SEEN
Bilirubin Urine: NEGATIVE
Glucose, UA: NEGATIVE mg/dL
Hgb urine dipstick: NEGATIVE
Ketones, ur: NEGATIVE mg/dL
Leukocytes,Ua: NEGATIVE
Nitrite: NEGATIVE
Protein, ur: NEGATIVE mg/dL
Specific Gravity, Urine: 1.014 (ref 1.005–1.030)
Squamous Epithelial / HPF: NONE SEEN (ref 0–5)
pH: 6 (ref 5.0–8.0)

## 2019-03-21 LAB — CBC
HCT: 44.7 % (ref 39.0–52.0)
Hemoglobin: 14.7 g/dL (ref 13.0–17.0)
MCH: 29.8 pg (ref 26.0–34.0)
MCHC: 32.9 g/dL (ref 30.0–36.0)
MCV: 90.7 fL (ref 80.0–100.0)
Platelets: 310 10*3/uL (ref 150–400)
RBC: 4.93 MIL/uL (ref 4.22–5.81)
RDW: 13 % (ref 11.5–15.5)
WBC: 11.6 10*3/uL — ABNORMAL HIGH (ref 4.0–10.5)
nRBC: 0 % (ref 0.0–0.2)

## 2019-03-21 MED ORDER — THIAMINE HCL 100 MG PO TABS
50.0000 mg | ORAL_TABLET | Freq: Once | ORAL | Status: AC
  Administered 2019-03-21: 14:00:00 50 mg via ORAL
  Filled 2019-03-21: qty 1

## 2019-03-21 MED ORDER — FOLIC ACID 1 MG PO TABS
1.0000 mg | ORAL_TABLET | Freq: Once | ORAL | Status: AC
  Administered 2019-03-21: 1 mg via ORAL
  Filled 2019-03-21: qty 1

## 2019-03-21 MED ORDER — SODIUM CHLORIDE 0.9 % IV BOLUS
500.0000 mL | Freq: Once | INTRAVENOUS | Status: AC
  Administered 2019-03-21: 16:00:00 500 mL via INTRAVENOUS

## 2019-03-21 MED ORDER — SODIUM CHLORIDE 0.9 % IV BOLUS
1000.0000 mL | Freq: Once | INTRAVENOUS | Status: AC
  Administered 2019-03-21: 14:00:00 1000 mL via INTRAVENOUS

## 2019-03-21 NOTE — ED Notes (Signed)
Meal brought to pt.  Pt still has urinal at the bedside, aware that urine sample is still needed

## 2019-03-21 NOTE — ED Provider Notes (Signed)
[facility] Emergency Department Provider Note   ____________________________________________   First MD Initiated Contact with Patient 03/21/19 1342     (approximate)  I have reviewed the triage vital signs and the nursing notes.   HISTORY  Chief Complaint Dizziness and Weakness    HPI Luke [name] is a 69 y.o. male with history of COPD depression COVID-19 infection  Patient reports that for 2 weeks now has been feeling very fatigued.  Reports low energy.  Has been out of work for about 3 weeks now since he was diagnosed and treated for coronavirus with infusions  He never required hospitalization however, but reports he just been feeling  dehydrated.  Still eating and drinking.  Denies ongoing cough.  No body aches or fevers any longer.  Reports he felt short of breath cough etc. and had loss of symptoms of coronavirus that resolved about 2 weeks ago, but has just been left feeling very fatigued since then.  Past Medical History:  Diagnosis Date  . Anxiety   . Arthritis   . Carcinoma metastatic to lymph node ([place])   . Cataract    right  . COPD (chronic obstructive pulmonary disease) ([place])   . Depression   . GERD (gastroesophageal reflux disease)   . Hoarseness of voice   . Personal history of tobacco use, presenting hazards to health February of 2001  . Thyroid disease     Patient Active Problem List   Diagnosis Date Noted  . Chronic bursitis of right shoulder 05/01/2018  . Submucosal lesion of esophagus   . Esophageal dysphagia   . Anxiety 12/06/2017  . Myalgia due to statin 09/01/2017  . Pre-diabetes 08/29/2017  . Centrilobular emphysema ([place]) 07/08/2017  . Dysphagia, pharyngeal phase 07/08/2017  . Personal history of tobacco use, presenting hazards to health 09/23/2014  . Pure hypercholesterolemia 09/06/2014  . Tobacco abuse 09/06/2014  . Benign essential hypertension 06/15/2013  . History of lymphoma 06/15/2013  . Esophageal  reflux 06/08/2013  . Hypothyroidism 06/08/2013    Past Surgical History:  Procedure Laterality Date  . BALLOON DILATION N/A 01/30/2018   Procedure: BALLOON DILATION;  Surgeon: Mauri Pole, MD;  Location: [facility] ENDOSCOPY;  Service: Endoscopy;  Laterality: N/A;  . COLONOSCOPY    . COLONOSCOPY WITH PROPOFOL N/A 11/29/2016   Procedure: COLONOSCOPY WITH PROPOFOL;  Surgeon: Manya Silvas, MD;  Location: [facility] ENDOSCOPY;  Service: Endoscopy;  Laterality: N/A;  . COLONOSCOPY WITH PROPOFOL N/A 08/22/2017   Procedure: COLONOSCOPY WITH PROPOFOL;  Surgeon: Jonathon Bellows, MD;  Location: [facility] ENDOSCOPY;  Service: Gastroenterology;  Laterality: N/A;  . ESOPHAGEAL MANOMETRY N/A 10/05/2017   Procedure: ESOPHAGEAL MANOMETRY (EM);  Surgeon: Jonathon Bellows, MD;  Location: [facility] ENDOSCOPY;  Service: Gastroenterology;  Laterality: N/A;  . ESOPHAGEAL MANOMETRY N/A 01/30/2018   Procedure: ESOPHAGEAL MANOMETRY (EM);  Surgeon: Mauri Pole, MD;  Location: [facility] ENDOSCOPY;  Service: Endoscopy;  Laterality: N/A;  . ESOPHAGOGASTRODUODENOSCOPY N/A 03/09/2018   Procedure: ESOPHAGOGASTRODUODENOSCOPY (EGD);  Surgeon: Milus Banister, MD;  Location: [facility] ENDOSCOPY;  Service: Endoscopy;  Laterality: N/A;  . ESOPHAGOGASTRODUODENOSCOPY (EGD) WITH PROPOFOL N/A 11/29/2016   Procedure: ESOPHAGOGASTRODUODENOSCOPY (EGD) WITH PROPOFOL;  Surgeon: Manya Silvas, MD;  Location: [facility] ENDOSCOPY;  Service: Endoscopy;  Laterality: N/A;  . ESOPHAGOGASTRODUODENOSCOPY (EGD) WITH PROPOFOL N/A 08/22/2017   Procedure: ESOPHAGOGASTRODUODENOSCOPY (EGD) WITH PROPOFOL;  Surgeon: Jonathon Bellows, MD;  Location: [facility] ENDOSCOPY;  Service: Gastroenterology;  Laterality: N/A;  . ESOPHAGOGASTRODUODENOSCOPY (EGD) WITH PROPOFOL N/A 01/30/2018   Procedure: ESOPHAGOGASTRODUODENOSCOPY (  EGD) WITH PROPOFOL;  Surgeon: Mauri Pole, MD;  Location: [facility] ENDOSCOPY;  Service: Endoscopy;  Laterality: N/A;  Mano probe to be placed during EGD  . EUS N/A 03/09/2018    Procedure: UPPER ENDOSCOPIC ULTRASOUND (EUS) RADIAL;  Surgeon: Milus Banister, MD;  Location: [facility] ENDOSCOPY;  Service: Endoscopy;  Laterality: N/A;  . NECK SURGERY     status post diagnosis of cancer with chemo and radiation  . SUBMUCOSAL INJECTION  03/09/2018   Procedure: SUBMUCOSAL INJECTION;  Surgeon: Milus Banister, MD;  Location: [facility] ENDOSCOPY;  Service: Endoscopy;;  . TONSILLECTOMY      Prior to Admission medications   Medication Sig Start Date End Date Taking? Authorizing Provider  albuterol (PROVENTIL) (2.5 MG/3ML) 0.083% nebulizer solution Take 3 mLs (2.5 mg total) by nebulization every 2 (two) hours as needed for wheezing. Dx: copd exacerbation J44.1 07/03/17  Yes Wieting, Richard, MD  albuterol (VENTOLIN HFA) 108 (90 Base) MCG/ACT inhaler Inhale 2 puffs into the lungs every 4 (four) hours as needed for wheezing or shortness of breath. 06/28/18  Yes Karamalegos, Devonne Doughty, DO  fluticasone (FLONASE) 50 MCG/ACT nasal spray Place 2 sprays into both nostrils daily. Use for 4-6 weeks then stop and use seasonally or as needed. 03/17/18  Yes Karamalegos, Devonne Doughty, DO  levothyroxine (SYNTHROID) 88 MCG tablet TAKE 1 TABLET BY MOUTH EVERY DAY ON AN EMPTY STOMACH 01/15/19  Yes Karamalegos, Devonne Doughty, DO  meloxicam (MOBIC) 15 MG tablet TAKE 1 TABLET(15 MG) BY MOUTH DAILY 03/13/19  Yes Karamalegos, Devonne Doughty, DO  Multiple Vitamin (MULTIVITAMIN WITH MINERALS) TABS tablet Take 1 tablet by mouth daily. One-A-Day Multivitamin   Yes [provider]  omeprazole (PRILOSEC) 20 MG capsule Take 1 capsule (20 mg total) by mouth daily before breakfast. 12/06/17  Yes Karamalegos, Devonne Doughty, DO  umeclidinium-vilanterol (ANORO ELLIPTA) 62.5-25 MCG/INH AEPB Inhale 1 puff into the lungs daily. 06/28/18  Yes Karamalegos, Devonne Doughty, DO  vitamin B-12 (CYANOCOBALAMIN) 500 MCG tablet Take 500 mcg by mouth 2 (two) times daily.   Yes [provider]  vitamin E (VITAMIN E) 180 MG (400 UNITS) capsule  Take 400 Units by mouth daily.   Yes [provider]  zinc sulfate 220 (50 Zn) MG capsule Take 220 mg by mouth daily.   Yes [provider]  predniSONE (DELTASONE) 20 MG tablet Take daily with food. Start with 40mg  (2 pills) x 4 days, then reduce to 20mg  (1 pills) x 3 days Patient not taking: Reported on 03/21/2019 03/14/19   Olin Hauser, DO    Allergies Patient has no known allergies.  Family History  Problem Relation Age of Onset  . Breast cancer Mother   . Heart disease Mother   . Cancer Mother        breast, back, bladder  . Heart attack Father   . Heart disease Father     Social History Social History   Tobacco Use  . Smoking status: Current Some Day Smoker    Packs/day: 0.50    Years: 40.00    Pack years: 20.00    Types: Cigarettes  . Smokeless tobacco: Never Used  Substance Use Topics  . Alcohol use: No  . Drug use: No    Review of Systems Constitutional: No fever/chills feels "dehydrated" Eyes: No visual changes. ENT: No sore throat.  Feels dry. Cardiovascular: Denies chest pain. Respiratory: Denies shortness of breath. Gastrointestinal: No abdominal pain. A little bit of slight lingering nausea at times for the  last couple weeks.  Denies nausea now, reports he like something to eat such as a cookie. Genitourinary: Negative for dysuria. Musculoskeletal: Negative for back pain. Skin: Negative for rash. Neurological: Negative for headaches, areas of focal weakness or numbness.    ____________________________________________   PHYSICAL EXAM:  VITAL SIGNS: ED Triage Vitals  Enc Vitals Group     BP 03/21/19 1228 (!) 76/49     Pulse Rate 03/21/19 1228 87     Resp 03/21/19 1228 16     Temp 03/21/19 1228 98.4 F (36.9 C)     Temp Source 03/21/19 1228 Oral     SpO2 03/21/19 1228 95 %     Weight 03/21/19 1229 195 lb (88.5 kg)     Height 03/21/19 1229 6\' 1"  (1.854 m)     Head Circumference --      Peak Flow --      Pain  Score 03/21/19 1228 8     Pain Loc --      Pain Edu? --      Excl. in [place]? --     Constitutional: Alert and oriented. Well appearing and in no acute distress. Eyes: Conjunctivae are normal. Head: Atraumatic. Nose: No congestion/rhinnorhea. Mouth/Throat: Mucous membranes are moist. Neck: No stridor.  Cardiovascular: Normal rate, regular rhythm. Grossly normal heart sounds.  Good peripheral circulation. Respiratory: Normal respiratory effort.  No retractions. Lungs CTAB. Gastrointestinal: Soft and nontender. No distention. Musculoskeletal: No lower extremity tenderness nor edema. Neurologic:  Normal speech and language. No gross focal neurologic deficits are appreciated.  Skin:  Skin is warm, dry and intact. No rash noted. Psychiatric: Mood and affect are normal. Speech and behavior are normal.  ____________________________________________   LABS (all labs ordered are listed, but only abnormal results are displayed)  Labs Reviewed  BASIC METABOLIC PANEL - Abnormal; Notable for the following components:      Result Value   BUN 29 (*)    Creatinine, Ser 1.30 (*)    GFR calc non Af Amer 57 (*)    All other components within normal limits  CBC - Abnormal; Notable for the following components:   WBC 11.6 (*)    All other components within normal limits  URINALYSIS, COMPLETE (UACMP) WITH MICROSCOPIC - Abnormal; Notable for the following components:   Color, Urine YELLOW (*)    APPearance HAZY (*)    All other components within normal limits  CBG MONITORING, ED   ____________________________________________  EKG  ED ECG REPORT I, Delman Kitten, the attending physician, personally viewed and interpreted this ECG.  Date: 03/21/2019 EKG Time: 1240 Rate: 95 Rhythm: normal sinus rhythm QRS Axis: normal Intervals: normal ST/T Wave abnormalities: normal Narrative Interpretation: no evidence of acute ischemia  ____________________________________________  RADIOLOGY  No noted  indication for imaging.  Reports his pulmonary symptoms of Covid are much improved.  Clear lungs resting comfortably at this time.  And denies any abdominal pain just slight lingering feeling of occasional nausea, none presently.  Reassuring abdominal exam ____________________________________________   PROCEDURES  Procedure(s) performed: None  Procedures  Critical Care performed: No  ____________________________________________   INITIAL IMPRESSION / ASSESSMENT AND PLAN / ED COURSE  Pertinent labs & imaging results that were available during my care of the patient were reviewed by me and considered in my medical decision making (see chart for details).   Patient is for evaluation of fatigue, feeling weak and dehydrated.  This is in the setting of recent Covid, which was treated.  Reports the  symptoms been ongoing for about 2 weeks.  He is noted to have some hypotension and on lab work his creatinine is minimally elevated, but likely suggesting dehydration given his clinical picture as well.  This could be a post viral type syndrome.  Denies any acute pulmonary cardiac or neurologic symptoms.  Reassuring exam.  Patient has been taking B vitamins, would be interested in taking any additional vitamins that we might be able to give him here so I have thus ordered some thiamine and folate as well.  Plan to hydrate and reassess.  Overall very reassuring exam at this time.    ----------------------------------------- 3:43 PM on 03/21/2019 -----------------------------------------  Reassessment care and reassesmsnet assigned to Dr. Archie Balboa  ____________________________________________   FINAL CLINICAL IMPRESSION(S) / ED DIAGNOSES  Final diagnoses:  Dehydration  Post viral syndrome        Note:  This document was prepared using Dragon voice recognition software and may include unintentional dictation errors       Delman Kitten, MD 03/21/19 1544

## 2019-03-21 NOTE — ED Provider Notes (Signed)
Patient felt better. Per doctors note from early February patient has known history of lower blood pressures. Will plan on discharging home.   Nance Pear, MD 03/21/19 [phone]

## 2019-03-21 NOTE — ED Triage Notes (Signed)
Pt in via POV, reports new onset dizziness 0600 today, also complains of generalized body aches and weakness going on for weeks.  Pt states, "I havent felt like this since I was taking Chemo years ago."

## 2019-03-21 NOTE — ED Notes (Signed)
Pt is requesting a cookie, call to dietary, they will bring him a tray with a cookie

## 2019-03-21 NOTE — Discharge Instructions (Addendum)
Please seek medical attention for any high fevers, chest pain, shortness of breath, change in behavior, persistent vomiting, bloody stool or any other new or concerning symptoms.  

## 2019-03-28 ENCOUNTER — Encounter: Payer: Self-pay | Admitting: Family Medicine

## 2019-03-28 ENCOUNTER — Other Ambulatory Visit: Payer: Self-pay

## 2019-03-28 ENCOUNTER — Ambulatory Visit (INDEPENDENT_AMBULATORY_CARE_PROVIDER_SITE_OTHER): Payer: BC Managed Care – PPO | Admitting: Family Medicine

## 2019-03-28 DIAGNOSIS — J432 Centrilobular emphysema: Secondary | ICD-10-CM | POA: Diagnosis not present

## 2019-03-28 DIAGNOSIS — G933 Postviral fatigue syndrome: Secondary | ICD-10-CM | POA: Diagnosis not present

## 2019-03-28 DIAGNOSIS — G9331 Postviral fatigue syndrome: Secondary | ICD-10-CM

## 2019-03-28 DIAGNOSIS — Z8616 Personal history of COVID-19: Secondary | ICD-10-CM

## 2019-03-28 NOTE — Progress Notes (Signed)
Virtual Visit via Telephone The purpose of this virtual visit is to provide medical care while limiting exposure to the novel coronavirus (COVID19) for both patient and office staff.  Consent was obtained for phone visit:  Yes.   Answered questions that patient had about telehealth interaction:  Yes.   I discussed the limitations, risks, security and privacy concerns of performing an evaluation and management service by telephone. I also discussed with the patient that there may be a patient responsible charge related to this service. The patient expressed understanding and agreed to proceed.  Patient Location: Home Provider Location: [facility])  ---------------------------------------------------------------------- Chief Complaint  Patient presents with  . FMLA paper work  . Fatigue    following COVID  . COPD    S: Reviewed CMA documentation. I have called patient and gathered additional HPI as follows:  FATIGUE / [place] / COVID19 History Initial symptoms onset 02/25/19 out of work due to [place] on 02/26/19 at [facility], positive test for COVID19 at that time, given prednisone course for treatment and IV antibody infusion on 02/28/19.  He improved initially, then seemed to have worsening symptoms  Last seen by me on 03/14/19 for virtual visit for COVID19 positive, had been treated with recurrence of symptoms with flare up of underlying acute COPD exacerbation as well likely, given longer course of Prednisone course, and also nebulizer treatments at home.  He was advised on 03/20/19 to seek care w/ Pulmonology specialist. They advised him to go to hospital ED at that time. He [facility] ED on [facility]/21[facility]. Diagnosed with dehydration, and had elevated Creatinine to 1.3 He was given IV fluid rehydration and had some mild hypotension. He did not have concerning respiratory symptoms at that time. He was to continue vitamins and  nutritional support.  Today he reports still difficulty with weakness and fatigue post-covid viral syndrome. He has been unable to return to work. Has attempted briefly past Mondays but only able to do a few hours then cannot stay for an actual full shift of work due to easy fatigue and worn out exhaustion. Admits some leg muscle soreness and weakness as well.  Taking Vitamin B12, C, D  Currently he reports his breathing seems to be fairly well controlled at this time. Denies wheezing, productive cough.  Denies fever chills sweats nausea vomiting, abdominal pain, chest pain, near syncope or syncope  Referral to Pulmonology - next apt 05/03/19  He has remained out of work since onset 02/25/19  Past Medical History:  Diagnosis Date  . Anxiety   . Arthritis   . Carcinoma metastatic to lymph node ([place])   . Cataract    right  . COPD (chronic obstructive pulmonary disease) ([place])   . Depression   . GERD (gastroesophageal reflux disease)   . Hoarseness of voice   . Personal history of tobacco use, presenting hazards to health February of 2001  . Thyroid disease    Social History   Tobacco Use  . Smoking status: Current Some Day Smoker    Packs/day: 0.50    Years: 40.00    Pack years: 20.00    Types: Cigarettes  . Smokeless tobacco: Never Used  Substance Use Topics  . Alcohol use: No  . Drug use: No    Current Outpatient Medications:  .  albuterol (PROVENTIL) (2.5 MG/3ML) 0.083% nebulizer solution, Take 3 mLs (2.5 mg total) by nebulization every 2 (two) hours as needed for wheezing.  Dx: copd exacerbation J44.1, Disp: 100 vial, Rfl: 0 .  albuterol (VENTOLIN HFA) 108 (90 Base) MCG/ACT inhaler, Inhale 2 puffs into the lungs every 4 (four) hours as needed for wheezing or shortness of breath., Disp: 1 Inhaler, Rfl: 3 .  fluticasone (FLONASE) 50 MCG/ACT nasal spray, Place 2 sprays into both nostrils daily. Use for 4-6 weeks then stop and use seasonally or as needed., Disp: 16 g, Rfl: 3 .   levothyroxine (SYNTHROID) 88 MCG tablet, TAKE 1 TABLET BY MOUTH EVERY DAY ON AN EMPTY STOMACH, Disp: 90 tablet, Rfl: 1 .  meloxicam (MOBIC) 15 MG tablet, TAKE 1 TABLET(15 MG) BY MOUTH DAILY, Disp: 30 tablet, Rfl: 2 .  Multiple Vitamin (MULTIVITAMIN WITH MINERALS) TABS tablet, Take 1 tablet by mouth daily. One-A-Day Multivitamin, Disp: , Rfl:  .  omeprazole (PRILOSEC) 20 MG capsule, Take 1 capsule (20 mg total) by mouth daily before breakfast., Disp: 90 capsule, Rfl: 1 .  umeclidinium-vilanterol (ANORO ELLIPTA) 62.5-25 MCG/INH AEPB, Inhale 1 puff into the lungs daily., Disp: 60 each, Rfl: 3 .  vitamin B-12 (CYANOCOBALAMIN) 500 MCG tablet, Take 500 mcg by mouth 2 (two) times daily., Disp: , Rfl:  .  vitamin E (VITAMIN E) 180 MG (400 UNITS) capsule, Take 400 Units by mouth daily., Disp: , Rfl:  .  zinc sulfate 220 (50 Zn) MG capsule, Take 220 mg by mouth daily., Disp: , Rfl:   Depression screen [facility] 2/9 03/28/2019 02/16/2019 05/01/2018  Decreased Interest 0 0 0  Down, Depressed, Hopeless 0 0 0  PHQ - 2 Score 0 0 0    No flowsheet data found.  -------------------------------------------------------------------------- O: No physical exam performed due to remote telephone encounter.  Lab results reviewed.  Recent Results (from the past 2160 hour(s))  T4, free     Status: None   Collection Time: 02/09/19 10:03 AM  Result Value Ref Range   Free T4 1.2 0.8 - 1.8 ng/dL  TSH     Status: None   Collection Time: 02/09/19 10:03 AM  Result Value Ref Range   TSH 3.00 0.40 - 4.50 mIU/L  PSA     Status: None   Collection Time: 02/09/19 10:03 AM  Result Value Ref Range   PSA 0.4 < OR = 4.0 ng/mL    Comment: The total PSA value from this assay system is  standardized against the WHO standard. The test  result will be approximately 20% lower when compared  to the equimolar-standardized total PSA (Beckman  Coulter). Comparison of serial PSA results should be  interpreted with this fact in  mind. . This test was performed using the Siemens  chemiluminescent method. Values obtained from  different assay methods cannot be used interchangeably. PSA levels, regardless of value, should not be interpreted as absolute evidence of the presence or absence of disease.   Lipid panel     Status: Abnormal   Collection Time: 02/09/19 10:03 AM  Result Value Ref Range   Cholesterol 186 <200 mg/dL   HDL 38 (L) > OR = 40 mg/dL   Triglycerides 75 <150 mg/dL   LDL Cholesterol (Calc) 131 (H) mg/dL (calc)    Comment: Reference range: <100 . Desirable range <100 mg/dL for primary prevention;   <70 mg/dL for patients with CHD or diabetic patients  with > or = 2 CHD risk factors. Marland Kitchen LDL-C is now calculated using the Martin-Hopkins  calculation, which is a validated novel method providing  better accuracy than the Friedewald equation in the  estimation  of LDL-C.  Cresenciano Genre et al. Annamaria Helling. [id number]): 2061-2068  (http://education.Quest[employer].com/faq/FAQ164)    Total CHOL/HDL Ratio 4.9 <5.0 (calc)   Non-HDL Cholesterol (Calc) 148 (H) <130 mg/dL (calc)    Comment: For patients with diabetes plus 1 major ASCVD risk  factor, treating to a non-HDL-C goal of <100 mg/dL  (LDL-C of <70 mg/dL) is considered a therapeutic  option.   COMPLETE METABOLIC PANEL WITH GFR     Status: Abnormal   Collection Time: 02/09/19 10:03 AM  Result Value Ref Range   Glucose, Bld 118 (H) 65 - 99 mg/dL    Comment: .            Fasting reference interval . For someone without known diabetes, a glucose value between 100 and 125 mg/dL is consistent with prediabetes and should be confirmed with a follow-up test. .    BUN 20 7 - 25 mg/dL   Creat 1.05 0.70 - 1.25 mg/dL    Comment: For patients >29 years of age, the reference limit for Creatinine is approximately 13% higher for people identified as African-American. .    GFR, Est Non African American 74 > OR = 60 mL/min/1.55m2   GFR, Est African American  85 > OR = 60 mL/min/1.87m2   BUN/Creatinine Ratio NOT APPLICABLE 6 - 22 (calc)   Sodium 141 135 - 146 mmol/L   Potassium 4.7 3.5 - 5.3 mmol/L   Chloride 102 98 - 110 mmol/L   CO2 31 20 - 32 mmol/L   Calcium 9.8 8.6 - 10.3 mg/dL   Total Protein 6.8 6.1 - 8.1 g/dL   Albumin 4.1 3.6 - 5.1 g/dL   Globulin 2.7 1.9 - 3.7 g/dL (calc)   AG Ratio 1.5 1.0 - 2.5 (calc)   Total Bilirubin 0.7 0.2 - 1.2 mg/dL   Alkaline phosphatase (APISO) 93 35 - 144 U/L   AST 20 10 - 35 U/L   ALT 13 9 - 46 U/L  CBC with Differential/Platelet     Status: Abnormal   Collection Time: 02/09/19 10:03 AM  Result Value Ref Range   WBC 11.8 (H) 3.8 - 10.8 Thousand/uL   RBC 4.80 4.20 - 5.80 Million/uL   Hemoglobin 14.7 13.2 - 17.1 g/dL   HCT 43.5 38.5 - 50.0 %   MCV 90.6 80.0 - 100.0 fL   MCH 30.6 27.0 - 33.0 pg   MCHC 33.8 32.0 - 36.0 g/dL   RDW 12.1 11.0 - 15.0 %   Platelets 306 140 - 400 Thousand/uL   MPV 11.0 7.5 - 12.5 fL   Neutro Abs 9,794 (H) 1,500 - 7,800 cells/uL   Lymphs Abs 1,310 850 - 3,900 cells/uL   Absolute Monocytes 531 200 - 950 cells/uL   Eosinophils Absolute 130 15 - 500 cells/uL   Basophils Absolute 35 0 - 200 cells/uL   Neutrophils Relative % 83 %   Total Lymphocyte 11.1 %   Monocytes Relative 4.5 %   Eosinophils Relative 1.1 %   Basophils Relative 0.3 %  Hemoglobin A1c     Status: Abnormal   Collection Time: 02/09/19 10:03 AM  Result Value Ref Range   Hgb A1c MFr Bld 6.0 (H) <5.7 % of total Hgb    Comment: For someone without known diabetes, a hemoglobin  A1c value between 5.7% and 6.4% is consistent with prediabetes and should be confirmed with a  follow-up test. . For someone with known diabetes, a value <7% indicates that their diabetes is well controlled. A1c targets should be  individualized based on duration of diabetes, age, comorbid conditions, and other considerations. . This assay result is consistent with an increased risk of diabetes. . Currently, no consensus  exists regarding use of hemoglobin A1c for diagnosis of diabetes for children. .    Mean Plasma Glucose 126 (calc)   eAG (mmol/L) 7.0 (calc)  Novel Coronavirus, NAA (Hosp order, Send-out to Ref Lab; TAT 18-24 hrs     Status: Abnormal   Collection Time: 02/26/19  4:46 PM   Specimen: Nasopharyngeal Swab; Respiratory  Result Value Ref Range   SARS-CoV-2, NAA DETECTED (A) NOT DETECTED    Comment: RESULT CALLED TO, READ BACK BY AND VERIFIED WITH: NOTIFIED LINDSEY WESTBROOKS 02/27/2019 1556 (NOTE)                  Client Requested Flag This nucleic acid amplification test was developed and its performance characteristics determined by [employer], [employer] and [employer]. Nucleic acid amplification tests include RT-PCR and TMA. This test has not been FDA cleared or approved. This test has been authorized by FDA under an Emergency Use Authorization (EUA). This test is only authorized for the duration of time the declaration that circumstances exist justifying the authorization of the emergency use of in vitro diagnostic tests for detection of SARS-CoV-2 virus and/or diagnosis of COVID-19 infection under section 564(b)(1) of the Act, 21 U.S.C. [id number]) (1), unless the authorization is terminated or revoked sooner. When diagnostic testing is negative, the possibility of a false negative result should be considered in the context of a patient's recent exposures and the presence o f clinical signs and symptoms consistent with COVID-19. An individual without symptoms of COVID- 19 and who is not shedding SARS-CoV-2 virus would expect to have a negative (not detected) result in this assay. Performed At: [facility] [place], [place] [id number] Rush Farmer MD [id number] Performed at [facility], [address] [place], [place] [address]    Coronavirus Source NASOPHARYNGEAL     Comment: Performed at [facility], [address] [place], [place]  [address]  Basic metabolic panel     Status: Abnormal   Collection Time: 03/21/19 12:38 PM  Result Value Ref Range   Sodium 140 135 - 145 mmol/L   Potassium 4.2 3.5 - 5.1 mmol/L   Chloride 103 98 - 111 mmol/L   CO2 27 22 - 32 mmol/L   Glucose, Bld 97 70 - 99 mg/dL    Comment: Glucose reference range applies only to samples taken after fasting for at least 8 hours.   BUN 29 (H) 8 - 23 mg/dL   Creatinine, Ser 1.30 (H) 0.61 - 1.24 mg/dL   Calcium 9.6 8.9 - 10.3 mg/dL   GFR calc non Af Amer 57 (L) >60 mL/min   GFR calc Af Amer >60 >60 mL/min   Anion gap 10 5 - 15    Comment: Performed at [facility], [address] [place], [place] [address]  CBC     Status: Abnormal   Collection Time: 03/21/19 12:38 PM  Result Value Ref Range   WBC 11.6 (H) 4.0 - 10.5 K/uL   RBC 4.93 4.22 - 5.81 MIL/uL   Hemoglobin 14.7 13.0 - 17.0 g/dL   HCT 44.7 39.0 - 52.0 %   MCV 90.7 80.0 - 100.0 fL   MCH 29.8 26.0 - 34.0 pg   MCHC 32.9 30.0 - 36.0 g/dL   RDW 13.0 11.5 - 15.5 %   Platelets 310 150 - 400 K/uL  nRBC 0.0 0.0 - 0.2 %    Comment: Performed at [facility], [address] [place], [place] [address]  Urinalysis, Complete w Microscopic     Status: Abnormal   Collection Time: 03/21/19  3:13 PM  Result Value Ref Range   Color, Urine YELLOW (A) YELLOW   APPearance HAZY (A) CLEAR   Specific Gravity, Urine 1.014 1.005 - 1.030   pH 6.0 5.0 - 8.0   Glucose, UA NEGATIVE NEGATIVE mg/dL   Hgb urine dipstick NEGATIVE NEGATIVE   Bilirubin Urine NEGATIVE NEGATIVE   Ketones, ur NEGATIVE NEGATIVE mg/dL   Protein, ur NEGATIVE NEGATIVE mg/dL   Nitrite NEGATIVE NEGATIVE   Leukocytes,Ua NEGATIVE NEGATIVE   RBC / HPF 0-5 0 - 5 RBC/hpf   WBC, UA 0-5 0 - 5 WBC/hpf   Bacteria, UA NONE SEEN NONE SEEN   Squamous Epithelial / LPF NONE SEEN 0 - 5   Hyaline Casts, UA PRESENT     Comment: Performed at [facility], [address] [place], [place] [address]     -------------------------------------------------------------------------- A&P:  Problem List Items Addressed This Visit    Centrilobular emphysema ([place])    Other Visit Diagnoses    Postviral fatigue syndrome    -  Primary   History of COVID-19         Clinically with post-viral syndrome after COVID19 infection in setting of complicated history with COPD Emphysema. 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 - See HPI for dates of onset symptoms, and treatments - Last ED visit 03/21/19 - labs reviewed, ultimately no other etiology identified, except he was dehydrated  Plan to continue current management with COPD medications Keep apt with [facility] on 05/03/19 to follow-up breathing / COPD Continue Vitamin supplements B, D C  Completed FMLA / insurance forms - he will remain continuous out of work incapacitation since 02/25/19 through estimated date of Mon 05/28/19 - approx 3 months due to post viral syndrome from [place]  Follow-up sooner as needed if significant improvement, may consider earlier return Return criteria given if acute worsening, when to follow-up vs go back to hospital ED  No orders of the defined types were placed in this encounter.   Follow-up: - Return in 6 weeks to follow-up return to work  Patient verbalizes understanding with the above medical recommendations including the limitation of remote medical advice.  Specific follow-up and call-back criteria were given for patient to follow-up or seek medical care more urgently if needed.   - Time spent in direct consultation with patient on phone: 9 minutes   Nobie Putnam, [facility] 03/28/2019, 4:25 PM

## 2019-03-28 NOTE — Patient Instructions (Addendum)
Out from 02/25/19 through 05/28/19.  Lung doctor visit on 05/03/19  We can see you at beginning of May 2021.  Pick up forms tomorrow 3/18  Please schedule a Follow-up Appointment to: Return in about 6 weeks (around 05/09/2019) for follow-up FMLA return to work.  If you have any other questions or concerns, please feel free to call the office or send a message through [place]. You may also schedule an earlier appointment if necessary.  Additionally, you may be receiving a survey about your experience at our office within a few days to 1 week by e-mail or mail. We value your feedback.  Nobie Putnam, DO [place]

## 2019-04-09 ENCOUNTER — Telehealth: Payer: Self-pay | Admitting: Internal Medicine

## 2019-04-09 ENCOUNTER — Other Ambulatory Visit: Payer: Self-pay

## 2019-04-09 ENCOUNTER — Encounter: Payer: Self-pay | Admitting: Family Medicine

## 2019-04-09 ENCOUNTER — Ambulatory Visit (INDEPENDENT_AMBULATORY_CARE_PROVIDER_SITE_OTHER): Payer: BC Managed Care – PPO | Admitting: Family Medicine

## 2019-04-09 VITALS — BP 98/66 | HR 89 | Temp 98.4°F | Resp 16 | Ht 73.0 in | Wt 191.0 lb

## 2019-04-09 DIAGNOSIS — F4321 Adjustment disorder with depressed mood: Secondary | ICD-10-CM | POA: Diagnosis not present

## 2019-04-09 DIAGNOSIS — Z8616 Personal history of COVID-19: Secondary | ICD-10-CM

## 2019-04-09 DIAGNOSIS — R0600 Dyspnea, unspecified: Secondary | ICD-10-CM | POA: Diagnosis not present

## 2019-04-09 DIAGNOSIS — J432 Centrilobular emphysema: Secondary | ICD-10-CM | POA: Diagnosis not present

## 2019-04-09 DIAGNOSIS — G933 Postviral fatigue syndrome: Secondary | ICD-10-CM

## 2019-04-09 DIAGNOSIS — J029 Acute pharyngitis, unspecified: Secondary | ICD-10-CM

## 2019-04-09 DIAGNOSIS — R0609 Other forms of dyspnea: Secondary | ICD-10-CM

## 2019-04-09 DIAGNOSIS — G9331 Postviral fatigue syndrome: Secondary | ICD-10-CM

## 2019-04-09 MED ORDER — AZITHROMYCIN 250 MG PO TABS: ORAL_TABLET | ORAL | 0 refills | Status: DC

## 2019-04-09 MED ORDER — SERTRALINE HCL 50 MG PO TABS: 50.0000 mg | ORAL_TABLET | Freq: Every day | ORAL | 3 refills | Status: DC

## 2019-04-09 NOTE — Telephone Encounter (Signed)
Per Ander Purpura,  Dr. Mortimer Fries is not at [facility] office today and told to check with MD at [facility] for recommendations.  Per Dr. Annamaria Boots, see if pt can come to [place] office to be seen as pt needs to be seen in person due to still having symptoms 2 months out. If pt cannot come to our [place] office, pt will need to try to get in to see PCP.   Attempted to call pt's sister [name] to provide her with recommendations from Dr. Annamaria Boots but unable to reach. Left a message for her to return call.   Called and spoke with pt in regards to info stated by Dr. Annamaria Boots. Pt has been scheduled for an appt 4/9 with Aaron Edelman at [place] office. Pt also stated to try to see if can get a sooner appt with PCP or if symptoms became worse to go to ED. Pt verbalized understanding. Nothing further needed.

## 2019-04-09 NOTE — Patient Instructions (Addendum)
Thank you for coming to the office today.  First dose COVID19 vaccine anytime after May 17 (should be approximately 90 days after dose of IV antibody on 02/28/19)  Refer to Cardiology - stay tuned. I am concerned that your fatigue is not improved and you are still winded with exertion. We need to check heart out.  [facility] at [place], [place] [address] Main: [phone]   Start Sertraline 50mg  once daily for mood / depression can help boost your energy may take 2-4 weeks for full effect.  Throat has some redness irritation, seems to have a tonsillar stone abnormality on L side Start Azithromycin Z pak (antibiotic) 2 tabs day 1, then 1 tab x 4 days, complete entire course even if improved  Keep apt with Pulmonologist  We will work with your employer to keep the short term disability plan  Please schedule a Follow-up Appointment to: Return in about 4 weeks (around 05/07/2019), or if symptoms worsen or fail to improve.  If you have any other questions or concerns, please feel free to call the office or send a message through [place]. You may also schedule an earlier appointment if necessary.  Additionally, you may be receiving a survey about your experience at our office within a few days to 1 week by e-mail or mail. We value your feedback.  Nobie Putnam, DO [place]

## 2019-04-09 NOTE — Telephone Encounter (Addendum)
Called and spoke with pt's sister [name]. [name] states since pt was diagnosed with Covid in January, pt has had complaints of SOB and chest tightness which has progressively become worse.  [name] stated that pt had to go out of work on disability due to this.  Pt is a pt at the [place] office, last seen by Dr. Ashby Dawes 04/04/18. Pt was prescribed steroids by PCP 3/3 and on 3/9 pt called office and was recommended to go to ED by Dr. Patsey Berthold.  Pt does have an upcoming appt with Dr. Mortimer Fries 4/22 for establishment of care.  [name] stated that pt is taking his anoro inhaler daily. She stated that she does not believe that pt has used his rescue inhaler or his nebulizer.  Pt and [name] would like recommendations to help with symptoms.   Routing to Dr. Mortimer Fries for recommendations and also sending him a secure chat message per Leslie's recommendations letting him know that this has been sent to him for him to follow up on.  Dr. Mortimer Fries, please advise on recommendations for pt. Thanks!

## 2019-04-09 NOTE — Progress Notes (Signed)
Subjective:    Patient ID: Luke [name], male    DOB: April 26, 1950, 69 y.o.   MRN: [id number]  Luke [name] is a 69 y.o. male presenting on 04/09/2019 for Fatigue (sore throat, depression)   HPI   FATIGUE / POST VIRAL SYNDROME / COVID19 DYSPNEA ON EXERTION COPD Initial symptoms onset 02/25/19 out of work due to [employer]. Diagnosed on 02/26/19 by urgent care. 02/28/19 had IV antibody infusion. Treated on 03/14/19 for virtual visit for COVID19 positive, had been treated with recurrence of symptoms with flare up of underlying acute COPD exacerbation as well likely, given longer course of Prednisone course, and also nebulizer treatments at home. He was advised on 03/20/19 to seek care w/ Pulmonology specialist. They advised him to go to hospital ED at that time. He went to [facility] ED on 03/21/19. Diagnosed with dehydration, and had elevated Creatinine to 1.3 He was given IV fluid rehydration and had some mild hypotension. He did not have concerning respiratory symptoms at that time. He was to continue vitamins and nutritional support. Last seen by me virtually on 03/28/19 for persistent fatigue and unable to work, he was experiencing significant dyspnea on exertion and fatigue even with mild or limited activity unable to work and muscle aching soreness and fatigue. He had upcoming Pulmonology apt in April 2021. He was continued on Vitamin Supplement, and advised to remain out of work for period of time for recovery.  Today he returns due to persistent fatigue, and overall limited improvement in past 2 weeks. He is still unable to return to work. He has contacted Pulmonology and they could not get him worked in today. His apt was moved up to 04/20/19. He was asked to see his PCP now due to persistent symptoms.  On disability short term, completed on 03/28/19. He received notification that they needed additional information.  Additionally - today he admits depressed as a result of these health issues, he cannot  do what he normally does and overall feels like he has no energy or motivation. - He has not taken anti depressant medication in past.  Admits sore throat, L side.  Admits chronic cough with COPD  He has not had a Cardiology evaluation. He has not endorsed chest pain or diaphoresis, arm pain nausea vomiting, but he has had persistent dyspnea on exertion. And easily fatigues  Taking Vitamin B12, C, D - still  Denies fever chills sweats nausea vomiting, abdominal pain, chest pain, near syncope or syncope  HM He is unable to get COVID19 vaccine 1st dose until waits 90 day after 02/28/19 (iv antibody infusion date)  Depression screen [facility] 2/9 04/09/2019 03/28/2019 02/16/2019  Decreased Interest 3 0 0  Down, Depressed, Hopeless 2 0 0  PHQ - 2 Score 5 0 0  Altered sleeping 1 - -  Tired, decreased energy 3 - -  Change in appetite 1 - -  Feeling bad or failure about yourself  3 - -  Trouble concentrating 2 - -  Moving slowly or fidgety/restless 2 - -  Suicidal thoughts 2 - -  PHQ-9 Score 19 - -  Difficult doing work/chores Extremely dIfficult - -   GAD 7 : Generalized Anxiety Score 04/09/2019  Nervous, Anxious, on Edge 2  Control/stop worrying 3  Worry too much - different things 2  Trouble relaxing 2  Restless 1  Easily annoyed or irritable 1  Afraid - awful might happen 2  Total GAD 7 Score 13  Anxiety Difficulty Extremely difficult  Social History   Tobacco Use  . Smoking status: Current Some Day Smoker    Packs/day: 0.50    Years: 40.00    Pack years: 20.00    Types: Cigarettes  . Smokeless tobacco: Never Used  Substance Use Topics  . Alcohol use: No  . Drug use: No    Review of Systems Per HPI unless specifically indicated above     Objective:    BP 98/66   Pulse 89   Temp 98.4 F (36.9 C) (Temporal)   Resp 16   Ht 6\' 1"  (1.854 m)   Wt 191 lb (86.6 kg)   SpO2 96%   BMI 25.20 kg/m   Wt Readings from Last 3 Encounters:  04/09/19 191 lb (86.6 kg)    03/21/19 195 lb (88.5 kg)  03/14/19 193 lb (87.5 kg)    Physical Exam Vitals and nursing note reviewed.  Constitutional:      General: He is not in acute distress.    Appearance: He is well-developed. He is not diaphoretic.     Comments: Chronically ill appearing, comfortable, cooperative  HENT:     Head: Normocephalic and atraumatic.     Mouth/Throat:     Pharynx: Posterior oropharyngeal erythema present.     Comments: Left posterior pharynx with appears to be a tonsillith stone or debris. Some erythema, no edema, no exudate Eyes:     General:        Right eye: No discharge.        Left eye: No discharge.     Conjunctiva/sclera: Conjunctivae normal.  Neck:     Thyroid: No thyromegaly.  Cardiovascular:     Rate and Rhythm: Normal rate and regular rhythm.     Heart sounds: Normal heart sounds. No murmur.  Pulmonary:     Effort: Pulmonary effort is normal. No respiratory distress.     Breath sounds: No wheezing or rales.     Comments: Diffuse reduced air movement. Some audible upper airway noises Musculoskeletal:        General: Normal range of motion.     Cervical back: Normal range of motion and neck supple.  Lymphadenopathy:     Cervical: No cervical adenopathy.  Skin:    General: Skin is warm and dry.     Findings: No erythema or rash.  Neurological:     Mental Status: He is alert and oriented to person, place, and time.  Psychiatric:        Behavior: Behavior normal.     Comments: Well groomed, good eye contact, normal speech and thoughts      Results for orders placed or performed during the hospital encounter of [id number]  Basic metabolic panel  Result Value Ref Range   Sodium 140 135 - 145 mmol/L   Potassium 4.2 3.5 - 5.1 mmol/L   Chloride 103 98 - 111 mmol/L   CO2 27 22 - 32 mmol/L   Glucose, Bld 97 70 - 99 mg/dL   BUN 29 (H) 8 - 23 mg/dL   Creatinine, Ser 1.30 (H) 0.61 - 1.24 mg/dL   Calcium 9.6 8.9 - 10.3 mg/dL   GFR calc non Af Amer 57 (L) >60 mL/min    GFR calc Af Amer >60 >60 mL/min   Anion gap 10 5 - 15  CBC  Result Value Ref Range   WBC 11.6 (H) 4.0 - 10.5 K/uL   RBC 4.93 4.22 - 5.81 MIL/uL   Hemoglobin 14.7 13.0 - 17.0 g/dL  HCT 44.7 39.0 - 52.0 %   MCV 90.7 80.0 - 100.0 fL   MCH 29.8 26.0 - 34.0 pg   MCHC 32.9 30.0 - 36.0 g/dL   RDW 13.0 11.5 - 15.5 %   Platelets 310 150 - 400 K/uL   nRBC 0.0 0.0 - 0.2 %  Urinalysis, Complete w Microscopic  Result Value Ref Range   Color, Urine YELLOW (A) YELLOW   APPearance HAZY (A) CLEAR   Specific Gravity, Urine 1.014 1.005 - 1.030   pH 6.0 5.0 - 8.0   Glucose, UA NEGATIVE NEGATIVE mg/dL   Hgb urine dipstick NEGATIVE NEGATIVE   Bilirubin Urine NEGATIVE NEGATIVE   Ketones, ur NEGATIVE NEGATIVE mg/dL   Protein, ur NEGATIVE NEGATIVE mg/dL   Nitrite NEGATIVE NEGATIVE   Leukocytes,Ua NEGATIVE NEGATIVE   RBC / HPF 0-5 0 - 5 RBC/hpf   WBC, UA 0-5 0 - 5 WBC/hpf   Bacteria, UA NONE SEEN NONE SEEN   Squamous Epithelial / LPF NONE SEEN 0 - 5   Hyaline Casts, UA PRESENT       Assessment & Plan:   Problem List Items Addressed This Visit    Centrilobular emphysema (HCC) - Primary   Relevant Medications   azithromycin (ZITHROMAX Z-PAK) 250 MG tablet    Other Visit Diagnoses    Postviral fatigue syndrome       Relevant Orders   Ambulatory referral to Cardiology   Dyspnea on exertion       Relevant Orders   Ambulatory referral to Cardiology   Acute pharyngitis, unspecified etiology       Relevant Medications   azithromycin (ZITHROMAX Z-PAK) 250 MG tablet   History of COVID-19       Relevant Orders   Ambulatory referral to Cardiology   Acute adjustment disorder with depressed mood       Relevant Medications   sertraline (ZOLOFT) 50 MG tablet      #Pharyngitis Uncertain etiology seems raw throat and erythema maybe tonsillith following cough and recent illness Will offer empiric coverage with Azithromycin to cover for throat and also respiratory source  #Adjustment disorder  acute depressed No history of clinical depression however he is experiencing depressed mood due to acute illness and limited recovery at this time - Given severity and his interest in treatment, will offer short term Sertarline 50mg  daily, perhaps some of his reduced energy fatigue can be related to mood as well - anticipate therapy for 3-6 months or more  #Post viral fatigue Syndrome / History of COVID / COPD / dyspnea on exertion  Clinically still persistent complication post covid now 1 month, without improvement recently Concern that he has severe COPD and complex respiratory history with known difficulties with dysphagia in past has caused complicated course, and he is high risk of complications from 0000000  He has been hospitalized and well treated for COVID acutely Now has limited recovery Offered X-ray Chest but he declined today Anticipate he should keep f/u with Pulmonology on 04/20/19 now that they moved up the apt  Additionally will pursue cardiac work up in post viral covid setting, given his limited improved DOE, however he does not appear fluid overloaded or other concerns, and most likely pulmonary etiology is cause, but with our goal to recover and get him back to work - will pursue Cardiology evaluation.  Letter written for his employer HR with this information and aiming for estimated return to work date 05/28/19, pending further evaluation.   Meds ordered this encounter  Medications  . azithromycin (ZITHROMAX Z-PAK) 250 MG tablet    Sig: Take 2 tabs (500mg  total) on Day 1. Take 1 tab (250mg ) daily for next 4 days.    Dispense:  6 tablet    Refill:  0  . sertraline (ZOLOFT) 50 MG tablet    Sig: Take 1 tablet (50 mg total) by mouth daily.    Dispense:  30 tablet    Refill:  3     Follow up plan: Return in about 4 weeks (around 05/07/2019), or if symptoms worsen or fail to improve.    Nobie Putnam, [facility] 04/09/2019, 2:57 PM

## 2019-04-20 ENCOUNTER — Encounter: Payer: Self-pay | Admitting: Pulmonary Disease

## 2019-04-20 ENCOUNTER — Ambulatory Visit
Admission: RE | Admit: 2019-04-20 | Discharge: 2019-04-20 | Disposition: A | Discharge status: Home / Self Care | Payer: BC Managed Care – PPO | Source: Ambulatory Visit | Attending: Pulmonary Disease | Admitting: Pulmonary Disease

## 2019-04-20 ENCOUNTER — Other Ambulatory Visit: Payer: Self-pay

## 2019-04-20 ENCOUNTER — Ambulatory Visit (INDEPENDENT_AMBULATORY_CARE_PROVIDER_SITE_OTHER): Payer: Medicare HMO | Admitting: Pulmonary Disease

## 2019-04-20 VITALS — BP 98/70 | HR 94 | Temp 98.2°F | Ht 73.0 in | Wt 193.4 lb

## 2019-04-20 DIAGNOSIS — R531 Weakness: Secondary | ICD-10-CM | POA: Diagnosis not present

## 2019-04-20 DIAGNOSIS — J432 Centrilobular emphysema: Secondary | ICD-10-CM

## 2019-04-20 DIAGNOSIS — Z720 Tobacco use: Secondary | ICD-10-CM | POA: Diagnosis not present

## 2019-04-20 DIAGNOSIS — Z8616 Personal history of COVID-19: Secondary | ICD-10-CM | POA: Diagnosis not present

## 2019-04-20 DIAGNOSIS — R1313 Dysphagia, pharyngeal phase: Secondary | ICD-10-CM

## 2019-04-20 MED ORDER — ANORO ELLIPTA 62.5-25 MCG/INH IN AEPB: 1.0000 | INHALATION_SPRAY | Freq: Every day | RESPIRATORY_TRACT | 3 refills | Status: DC

## 2019-04-20 MED ORDER — NICOTINE POLACRILEX 4 MG MT GUM: 4.0000 mg | CHEWING_GUM | OROMUCOSAL | 3 refills | Status: DC | PRN

## 2019-04-20 MED ORDER — NICOTINE 21 MG/24HR TD PT24: 21.0000 mg | MEDICATED_PATCH | Freq: Every day | TRANSDERMAL | 1 refills | Status: DC

## 2019-04-20 MED ORDER — BUPROPION HCL ER (SR) 150 MG PO TB12: 150.0000 mg | ORAL_TABLET | Freq: Two times a day (BID) | ORAL | 1 refills | Status: DC

## 2019-04-20 NOTE — Assessment & Plan Note (Signed)
Tested positive for Covid 02/26/2019 Received monoclonal antibody infusion on 02/28/2019 Patient reports that he felt significant improvement after receiving the monoclonal antibody infusion Patient continues to have symptoms of long Covid such as fatigue, brain fog, muscle weakness He was hospitalized a few weeks later after Covid due to dehydration  Patient has not had imaging yet post Covid  Plan: Walk today in office, no oxygen desaturations Referral to pulmonary rehab Chest x-ray today Pulmonary function testing ordered

## 2019-04-20 NOTE — Assessment & Plan Note (Signed)
102-pack-year smoking history Current smoker Willing to decrease smoking Adherent to Anoro Ellipta Status post Covid in February/2021 No formal pulmonary function testing on file No recent chest x-ray imaging  Plan: Walk today in office without any oxygen desaturations Chest x-ray today May need to consider high-resolution CT chest after chest x-ray Refill Anoro Ellipta Referral to pulmonary rehab Emphasized importance to stop smoking We will establish care with new pulmonologist in our practice

## 2019-04-20 NOTE — Progress Notes (Signed)
@Patient  ID: Luke [name], male    DOB: 06/09/1950, 69 y.o.   MRN: [id number]  Chief Complaint  Patient presents with  . Follow-up    feb/2021 covid, recieved mab, still short of breath     Referring provider: Nobie Putnam *  HPI:  69 year old male current smoker followed in our office for centrilobular emphysema.  Patient is status post COVID-19 infection February/2021.  PMH: GERD, hypertension, hypothyroidism, prediabetes Smoker/ Smoking History: Smoker. 1 ppd. 102 pack year history.  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  04/20/2019  - Visit   69 year old male current smoker followed in our office for emphysema.  Patient was last seen in our office in March/2020 by DR.  Since then patient has been seen in the emergency room in October/2020 as a COPD exacerbation.  Patient was also treated in urgent care for nasal congestion in February/2021.  At that time he was also diagnosed with SARS-CoV-2.  He followed up with primary care.  Does not look like the patient required hospitalization or mechanical ventilation for this.  Patient did receive the monoclonal antibody infusion on 02/28/2019.  Patient contacted our office on 04/09/2019 reporting that he is continuing to have increased shortness of breath status post Covid.  He presented back to primary care on 04/09/2019.  He was prescribed a Z-Pak at that time.  Patient presenting to our office today.  He reports he continues to have shortness of breath.  Walk today in office he completed 2 laps without any oxygen desaturations.  He has not had follow-up imaging since contracting Covid.  He also needs pulmonary function testing.  Patient still maintained on Anoro Ellipta.  He needs a refill of this.  Patient does continue to smoke.  We will discuss this today.  He is open to stopping smoking.  Patient continues to have increased myalgias and weakness.  He feels the weakness is bilateral.  He feels that he is continue  to have ongoing weakness since contracting Covid.  He also feels that he is having brain fog and increased confusion.  Questionaires / Pulmonary Flowsheets:   MMRC: mMRC Dyspnea Scale mMRC Score  04/20/2019 3    Epworth:  No flowsheet data found.  Tests:   02/26/2019-SARS-CoV-2-positive 02/28/2019-monoclonal antibody infusion  01/03/2019-CT chest lung cancer screening-lung RADS 1S, negative, repeat screening in 12 months, mild centrilobular and paraseptal emphysema   FENO:  No results found for: NITRICOXIDE  PFT: No flowsheet data found.  WALK:  SIX MIN WALK 04/20/2019  Supplimental Oxygen during Test? (L/min) No  Tech Comments: had to stop after 2 laps because his legs were hurting him.    Imaging: DG Chest 2 View  Result Date: 04/20/2019 CLINICAL DATA:  69 year old male with weakness. History of recent COVID. EXAM: CHEST - 2 VIEW COMPARISON:  Chest radiograph dated 10/29/2018. FINDINGS: There is mild diffuse interstitial coarsening and minimal nodularity, likely chronic. No focal consolidation, pleural effusion, pneumothorax. The cardiac silhouette is within limits. No acute osseous pathology. IMPRESSION: Mild interstitial coarsening.  No focal consolidation. Electronically Signed   By: Anner Crete M.D.   On: 04/20/2019 16:54    Lab Results:  CBC    Component Value Date/Time   WBC 11.6 (H) 03/21/2019 1238   RBC 4.93 03/21/2019 1238   HGB 14.7 03/21/2019 1238   HGB 14.6 08/20/2013 1504   HCT 44.7 03/21/2019 1238   HCT 43.7 08/20/2013 1504   PLT 310 03/21/2019 1238   PLT 221 08/20/2013 1504  MCV 90.7 03/21/2019 1238   MCV 94 08/20/2013 1504   MCH 29.8 03/21/2019 1238   MCHC 32.9 03/21/2019 1238   RDW 13.0 03/21/2019 1238   RDW 13.1 08/20/2013 1504   LYMPHSABS 1,310 02/09/2019 1003   LYMPHSABS 1.6 08/20/2013 1504   MONOABS 0.4 02/13/2016 1320   MONOABS 0.6 08/20/2013 1504   EOSABS 130 02/09/2019 1003   EOSABS 0.2 08/20/2013 1504   BASOSABS 35 02/09/2019  1003   BASOSABS 0.1 08/20/2013 1504    BMET    Component Value Date/Time   NA 140 03/21/2019 1238   NA 141 08/20/2013 1504   K 4.2 03/21/2019 1238   K 4.1 08/20/2013 1504   CL 103 03/21/2019 1238   CL 105 08/20/2013 1504   CO2 27 03/21/2019 1238   CO2 29 08/20/2013 1504   GLUCOSE 97 03/21/2019 1238   GLUCOSE 123 (H) 08/20/2013 1504   BUN 29 (H) 03/21/2019 1238   BUN 29 (H) 08/20/2013 1504   CREATININE 1.30 (H) 03/21/2019 1238   CREATININE 1.05 02/09/2019 1003   CALCIUM 9.6 03/21/2019 1238   CALCIUM 8.8 08/20/2013 1504   GFRNONAA 57 (L) 03/21/2019 1238   GFRNONAA 74 02/09/2019 1003   GFRAA >60 03/21/2019 1238   GFRAA 85 02/09/2019 1003    BNP    Component Value Date/Time   BNP 105.0 (H) 07/01/2017 1837    ProBNP No results found for: PROBNP  Specialty Problems      Pulmonary Problems   Centrilobular emphysema (HCC)   Dysphagia, pharyngeal phase      No Known Allergies  Immunization History  Administered Date(s) Administered  . Influenza Inj Mdck Quad With Preservative 10/21/2016  . Influenza,inj,Quad PF,6+ Mos 10/09/2015  . Influenza-Unspecified 11/04/2017  . Pneumococcal Conjugate-13 02/16/2019  . Pneumococcal Polysaccharide-23 07/02/2017  . Zoster 09/05/2013    Past Medical History:  Diagnosis Date  . Anxiety   . Arthritis   . Carcinoma metastatic to lymph node ([place])   . Cataract    right  . COPD (chronic obstructive pulmonary disease) ([place])   . Depression   . GERD (gastroesophageal reflux disease)   . Hoarseness of voice   . Personal history of tobacco use, presenting hazards to health February of 2001  . Thyroid disease     Tobacco History: Social History   Tobacco Use  Smoking Status Current Some Day Smoker  . Packs/day: 2.00  . Years: 51.00  . Pack years: 102.00  . Types: Cigarettes  . Start date: 1970  Smokeless Tobacco Never Used  Tobacco Comment   04/20/19 down to a pack per day    Ready to quit: Yes Counseling given: Yes  Comment: 04/20/19 down to a pack per day   Smoking assessment and cessation counseling  Patient currently smoking: 1 pack/day I have advised the patient to quit/stop smoking as soon as possible due to high risk for multiple medical problems.  It will also be very difficult for [place] to manage patient's  respiratory symptoms and status if we continue to expose her lungs to a known irritant.  We do not advise e-cigarettes as a form of stopping smoking.  Patient is  willing to quit smoking.  Discussed treatment options.  Patient agrees with proceeding forward with Wellbutrin prescription as well as nicotine replacement therapies.  I have advised the patient that we can assist and have options of nicotine replacement therapy, provided smoking cessation education today, provided smoking cessation counseling, and provided cessation resources.  Follow-up next office  visit office visit for assessment of smoking cessation.    Smoking cessation counseling advised for: 11 min    Outpatient Encounter Medications as of 04/20/2019  Medication Sig  . albuterol (PROVENTIL) (2.5 MG/3ML) 0.083% nebulizer solution Take 3 mLs (2.5 mg total) by nebulization every 2 (two) hours as needed for wheezing. Dx: copd exacerbation J44.1  . albuterol (VENTOLIN HFA) 108 (90 Base) MCG/ACT inhaler Inhale 2 puffs into the lungs every 4 (four) hours as needed for wheezing or shortness of breath.  . fluticasone (FLONASE) 50 MCG/ACT nasal spray Place 2 sprays into both nostrils daily. Use for 4-6 weeks then stop and use seasonally or as needed.  Marland Kitchen levothyroxine (SYNTHROID) 88 MCG tablet TAKE 1 TABLET BY MOUTH EVERY DAY ON AN EMPTY STOMACH  . meloxicam (MOBIC) 15 MG tablet TAKE 1 TABLET(15 MG) BY MOUTH DAILY  . Multiple Vitamin (MULTIVITAMIN WITH MINERALS) TABS tablet Take 1 tablet by mouth daily. One-A-Day Multivitamin  . omeprazole (PRILOSEC) 20 MG capsule Take 1 capsule (20 mg total) by mouth daily before breakfast.  .  sertraline (ZOLOFT) 50 MG tablet Take 1 tablet (50 mg total) by mouth daily.  Marland Kitchen umeclidinium-vilanterol (ANORO ELLIPTA) 62.5-25 MCG/INH AEPB Inhale 1 puff into the lungs daily.  . vitamin B-12 (CYANOCOBALAMIN) 500 MCG tablet Take 500 mcg by mouth 2 (two) times daily.  . vitamin E (VITAMIN E) 180 MG (400 UNITS) capsule Take 400 Units by mouth daily.  Marland Kitchen zinc sulfate 220 (50 Zn) MG capsule Take 220 mg by mouth daily.  . [DISCONTINUED] azithromycin (ZITHROMAX Z-PAK) 250 MG tablet Take 2 tabs (500mg  total) on Day 1. Take 1 tab (250mg ) daily for next 4 days.  . [DISCONTINUED] umeclidinium-vilanterol (ANORO ELLIPTA) 62.5-25 MCG/INH AEPB Inhale 1 puff into the lungs daily.  Marland Kitchen buPROPion (WELLBUTRIN SR) 150 MG 12 hr tablet Take 1 tablet (150 mg total) by mouth 2 (two) times daily.  . nicotine (NICODERM CQ) 21 mg/24hr patch Place 1 patch (21 mg total) onto the skin daily.  . nicotine polacrilex (NICORETTE) 4 MG gum Take 1 each (4 mg total) by mouth as needed for smoking cessation.   No facility-administered encounter medications on file as of 04/20/2019.     Review of Systems  Review of Systems  Constitutional: Positive for fatigue. Negative for activity change, chills, fever and unexpected weight change.  HENT: Positive for congestion. Negative for postnasal drip, rhinorrhea, sinus pressure, sinus pain and sore throat.   Eyes: Negative.   Respiratory: Positive for cough (prod dark brown mucous ) and shortness of breath. Negative for wheezing.   Cardiovascular: Negative for chest pain and palpitations.  Gastrointestinal: Negative for constipation, diarrhea, nausea and vomiting.  Endocrine: Negative.   Genitourinary: Negative.   Musculoskeletal: Positive for myalgias.  Skin: Negative.   Neurological: Positive for weakness and headaches. Negative for dizziness.  Psychiatric/Behavioral: Negative.  Negative for dysphoric mood. The patient is not nervous/anxious.   All other systems reviewed and are  negative.    Physical Exam  BP 98/70 (BP Location: Left Arm, Cuff Size: Normal)   Pulse 94   Temp 98.2 F (36.8 C) (Temporal)   Ht 6\' 1"  (1.854 m)   Wt 193 lb 6.4 oz (87.7 kg)   SpO2 (!) 88%   BMI 25.52 kg/m   Wt Readings from Last 5 Encounters:  04/20/19 193 lb 6.4 oz (87.7 kg)  04/09/19 191 lb (86.6 kg)  03/21/19 195 lb (88.5 kg)  03/14/19 193 lb (87.5 kg)  02/26/19 193 lb (  87.5 kg)    BMI Readings from Last 5 Encounters:  04/20/19 25.52 kg/m  04/09/19 25.20 kg/m  03/21/19 25.73 kg/m  03/14/19 25.46 kg/m  02/26/19 25.46 kg/m     Physical Exam Vitals and nursing note reviewed.  Constitutional:      General: He is not in acute distress.    Appearance: Normal appearance. He is normal weight.  HENT:     Head: Normocephalic and atraumatic.     Right Ear: Hearing, tympanic membrane, ear canal and external ear normal. There is no impacted cerumen.     Left Ear: Hearing, tympanic membrane, ear canal and external ear normal. There is no impacted cerumen.     Nose: Nose normal. No mucosal edema or rhinorrhea.     Right Turbinates: Not enlarged.     Left Turbinates: Not enlarged.     Mouth/Throat:     Mouth: Mucous membranes are dry.     Pharynx: Oropharynx is clear. No oropharyngeal exudate.  Eyes:     Pupils: Pupils are equal, round, and reactive to light.  Cardiovascular:     Rate and Rhythm: Normal rate and regular rhythm.     Pulses: Normal pulses.     Heart sounds: Normal heart sounds. No murmur.  Pulmonary:     Effort: Pulmonary effort is normal. No respiratory distress.     Breath sounds: No decreased breath sounds, wheezing or rales.     Comments: Diminished breath sounds.exam Musculoskeletal:     Cervical back: Normal range of motion.     Right lower leg: No edema.     Left lower leg: No edema.  Lymphadenopathy:     Cervical: No cervical adenopathy.  Skin:    General: Skin is warm and dry.     Capillary Refill: Capillary refill takes less than 2  seconds.     Findings: No erythema or rash.  Neurological:     General: No focal deficit present.     Mental Status: He is alert and oriented to person, place, and time.     Motor: No weakness.     Coordination: Coordination normal.     Gait: Gait is intact. Gait (Tolerated walk in office) normal.  Psychiatric:        Mood and Affect: Mood normal.        Behavior: Behavior normal. Behavior is cooperative.        Thought Content: Thought content normal.        Judgment: Judgment normal.       Assessment & Plan:   History of COVID-19 Tested positive for Covid 02/26/2019 Received monoclonal antibody infusion on 02/28/2019 Patient reports that he felt significant improvement after receiving the monoclonal antibody infusion Patient continues to have symptoms of long Covid such as fatigue, brain fog, muscle weakness He was hospitalized a few weeks later after Covid due to dehydration  Patient has not had imaging yet post Covid  Plan: Walk today in office, no oxygen desaturations Referral to pulmonary rehab Chest x-ray today Pulmonary function testing ordered  Dysphagia, pharyngeal phase History of aspiration pneumonia Has been evaluated by gastroenterology encouraged to utilize chin tuck maneuvers Patient reports that he is currently using these  Plan: Continue to clinically monitor Chest x-ray today  Centrilobular emphysema ([place]) 102-pack-year smoking history Current smoker Willing to decrease smoking Adherent to Anoro Ellipta Status post Covid in February/2021 No formal pulmonary function testing on file No recent chest x-ray imaging  Plan: Walk today in office without any  oxygen desaturations Chest x-ray today May need to consider high-resolution CT chest after chest x-ray Refill Anoro Ellipta Referral to pulmonary rehab Emphasized importance to stop smoking We will establish care with new pulmonologist in our practice    Return in about 3 months (around  07/20/2019), or if symptoms worsen or fail to improve, for [facility] - Dr. Patsey Berthold, [facility] - Dr. Mortimer Fries.   Lauraine Rinne, NP 04/20/2019   This appointment required 42 minutes of patient care (this includes precharting, chart review, review of results, face-to-face care, etc.).

## 2019-04-20 NOTE — Assessment & Plan Note (Signed)
History of aspiration pneumonia Has been evaluated by gastroenterology encouraged to utilize chin tuck maneuvers Patient reports that he is currently using these  Plan: Continue to clinically monitor Chest x-ray today

## 2019-04-20 NOTE — Patient Instructions (Addendum)
You were seen today by Lauraine Rinne, NP  for:   1. History of COVID-19  Chest x-ray today  Walk today in office, oxygen saturations were stable without desaturations this is good news  May need to consider high-resolution CT imaging sometime over the next 3 months will base this off of your breathing test  We will order pulmonary function testing, this may be easier to get completed in [facility]  Referral to pulmonary rehab  You can receive the Covid vaccine 90 days after your monoclonal antibody infusion which she received on 02/28/2019  2. Centrilobular emphysema (HCC)  Anoro Ellipta  >>> Take 1 puff daily in the morning right when you wake up >>>Rinse your mouth out after use >>>This is a daily maintenance inhaler, NOT a rescue inhaler >>>Contact our office if you are having difficulties affording or obtaining this medication >>>It is important for you to be able to take this daily and not miss any doses   Please stop smoking  Note your daily symptoms > remember "red flags" for COPD:   >>>Increase in cough >>>increase in sputum production >>>increase in shortness of breath or activity  intolerance.   If you notice these symptoms, please call the office to be seen.    3. Tobacco abuse  Start Wellbutrin 150 mg tablet >>>Take one 150 mg tablet daily for 3 days, then increase to 150 mg twice daily >>>start this medication 1 to 2 weeks before your quit date, we recommend abruptly quitting smoking while on this medication.  There is an option to do a gradual smoking reduction to reduce smoking by 50% by week 4 and another 50% by week 8 and to come to completely stop smoking by week 12.  Nicotine patches: >>>Make sure you rotate sites that you do not get skin irritation, Apply 1 patch each morning to a non-hairy skin site  If you are smoking greater than 10 cigarettes/day and weigh over 45 kg start with the nicotine patch of 21 mg a day for 6 weeks, then 14 mg a day for 2  weeks, then finished with 7 mg a day for 2 weeks, then stop  If you are smoking less than 10 cigarettes a day or weight less than 45 kg start with medium dose pack of 14 mg a day for 6 weeks, followed by 7 mg a day for 2 weeks   >>>If insomnia occurs you are having trouble sleeping you can take the patch off at night, and place a new one on in the morning >>>If the patch is removed at night and you have morning cravings start short acting nicotine replacement therapy such as gum or lozenges  Nicotine Gum:  >>>Smokers who smoke 25 or more cigarettes a day should use 4 mg dose >>>Smokers who smoke fewer than 25 cigarettes a day should use 2 mg dose  Proper chewing of gum is important for optimal results.   >>>Do not chew gum to rapidly.  Once you start chewing eating tasty peppery taste and slide the gum to your cheek.  When the taste disappears to a few more times.  Repeat this for 30 minutes.  Then discard the gum.  >>>Avoid acidic beverages such as coffee, carbonated beverages before and during gum use.  A soft acidic beverages lower oral pH which cause nicotine to not be absorbed properly >>>If you chew the gum too quickly or vigorously you could have nausea, vomiting, abdominal pain, constipation, hiccups, headache, sore jaw, mouth irritation ulcers  We recommend that you stop smoking.  >>>You need to set a quit date >>>If you have friends or family who smoke, let them know you are trying to quit and not to smoke around you or in your living environment  Smoking Cessation Resources:  1 800 QUIT NOW  >>> Patient to call this resource and utilize it to help support her quit smoking >>> Keep up your hard work with stopping smoking  You can also contact the [facility] >>>For smoking cessation classes call [phone]  We do not recommend using e-cigarettes as a form of stopping smoking  You can sign up for smoking cessation support texts and information:    >>>https://smokefree.gov/smokefreetxt    Continue forward with lung cancer screening CT   4. Dysphagia, pharyngeal phase  Continue chin tuck maneuvers  You are at high risk of having aspiration pneumonia if you do not follow the chin tuck swallowing maneuvers  If you start to have fevers, worsening chest congestion you need to contact our office   Follow Up:    Return in about 3 months (around 07/20/2019), or if symptoms worsen or fail to improve, for [facility] - Dr. Patsey Berthold, [facility] - Dr. Mortimer Fries.  PFT in [place] with Wyn Quaker FNP OV   Please do your part to reduce the spread of COVID-19:      Reduce your risk of any infection  and COVID19 by using the similar precautions used for avoiding the common cold or flu:  Marland Kitchen Wash your hands often with soap and warm water for at least 20 seconds.  If soap and water are not readily available, use an alcohol-based hand sanitizer with at least 60% alcohol.  . If coughing or sneezing, cover your mouth and nose by coughing or sneezing into the elbow areas of your shirt or coat, into a tissue or into your sleeve (not your hands). Langley Gauss A MASK when in public  . Avoid shaking hands with others and consider head nods or verbal greetings only. . Avoid touching your eyes, nose, or mouth with unwashed hands.  . Avoid close contact with people who are sick. . Avoid places or events with large numbers of people in one location, like concerts or sporting events. . If you have some symptoms but not all symptoms, continue to monitor at home and seek medical attention if your symptoms worsen. . If you are having a medical emergency, call 911.   [place] / e-Visit: [email or web address]         [facility]: [facility]: [id number]                   [facility]: [id number]      It is flu season:   >>> Best ways to protect herself from the flu: Receive the yearly flu vaccine, practice good hand hygiene washing with soap and also using hand sanitizer when available, eat a nutritious meals, get adequate rest, hydrate appropriately   Please contact the office if your symptoms worsen or you have concerns that you are not improving.   Thank you for choosing [facility] for your healthcare, and for allowing [place] to partner with you on your healthcare journey. I am thankful to be able to provide care to you today.   Wyn Quaker FNP-C    Health Risks of Smoking Smoking cigarettes is very bad for your health.  Tobacco smoke has over 200 known poisons in it. It contains the poisonous gases nitrogen oxide and carbon monoxide. There are over 60 chemicals in tobacco smoke that cause cancer. Smoking is difficult to quit because a chemical in tobacco, called nicotine, causes addiction or dependence. When you smoke and inhale, nicotine is absorbed rapidly into the bloodstream through your lungs. Both inhaled and non-inhaled nicotine may be addictive. What are the risks of cigarette smoke? Cigarette smokers have an increased risk of many serious medical problems, including:  Lung cancer.  Lung disease, such as pneumonia, bronchitis, and emphysema.  Chest pain (angina) and heart attack because the heart is not getting enough oxygen.  Heart disease and peripheral blood vessel disease.  High blood pressure (hypertension).  Stroke.  Oral cancer, including cancer of the lip, mouth, or voice box.  Bladder cancer.  Pancreatic cancer.  Cervical cancer.  Pregnancy complications, including premature birth.  Stillbirths and smaller newborn babies, birth defects, and genetic damage to sperm.  Early menopause.  Lower estrogen level for women.  Infertility.  Facial wrinkles.  Blindness.  Increased risk of broken bones (fractures).  Senile  dementia.  Stomach ulcers and internal bleeding.  Delayed wound healing and increased risk of complications during surgery.  Even smoking lightly shortens your life expectancy by several years. Because of secondhand smoke exposure, children of smokers have an increased risk of the following:  Sudden infant death syndrome (SIDS).  Respiratory infections.  Lung cancer.  Heart disease.  Ear infections. What are the benefits of quitting? There are many health benefits of quitting smoking. Here are some of them:  Within days of quitting smoking, your risk of having a heart attack decreases, your blood flow improves, and your lung capacity improves. Blood pressure, pulse rate, and breathing patterns start returning to normal soon after quitting.  Within months, your lungs may clear up completely.  Quitting for 10 years reduces your risk of developing lung cancer and heart disease to almost that of a nonsmoker.  People who quit may see an improvement in their overall quality of life. How do I quit smoking?     Smoking is an addiction with both physical and psychological effects, and longtime habits can be hard to change. Your health care provider can recommend:  Programs and community resources, which may include group support, education, or talk therapy.  Prescription medicines to help reduce cravings.  Nicotine replacement products, such as patches, gum, and nasal sprays. Use these products only as directed. Do not replace cigarette smoking with electronic cigarettes, which are commonly called e-cigarettes. The safety of e-cigarettes is not known, and some may contain harmful chemicals.  A combination of two or more of these methods. Where to find more information  American Lung Association: www.lung.org  American Cancer Society: www.cancer.org Summary  Smoking cigarettes is very bad for your health. Cigarette smokers have an increased risk of many serious medical problems,  including several cancers, heart disease, and stroke.  Smoking is an addiction with both physical and psychological effects, and longtime habits can be hard to change.  By stopping right away, you can greatly reduce the risk of medical problems for you and your family.  To help you quit smoking, your health care provider can recommend programs, community resources, prescription medicines, and nicotine replacement products such as patches, gum, and nasal sprays. This information is not intended to replace advice given to you by your health care provider. Make sure you discuss any questions you have with  your health care provider. Document Revised: 03/31/2017 Document Reviewed: 01/02/2016 Elsevier Patient Education  2020 [employer].

## 2019-04-23 ENCOUNTER — Encounter: Payer: Self-pay | Admitting: Cardiology

## 2019-04-23 ENCOUNTER — Ambulatory Visit (INDEPENDENT_AMBULATORY_CARE_PROVIDER_SITE_OTHER): Payer: BC Managed Care – PPO | Admitting: Cardiology

## 2019-04-23 ENCOUNTER — Other Ambulatory Visit: Payer: Self-pay

## 2019-04-23 VITALS — BP 132/80 | HR 74 | Ht 73.0 in | Wt 196.0 lb

## 2019-04-23 DIAGNOSIS — I739 Peripheral vascular disease, unspecified: Secondary | ICD-10-CM

## 2019-04-23 DIAGNOSIS — R079 Chest pain, unspecified: Secondary | ICD-10-CM | POA: Diagnosis not present

## 2019-04-23 DIAGNOSIS — R5383 Other fatigue: Secondary | ICD-10-CM

## 2019-04-23 DIAGNOSIS — F17200 Nicotine dependence, unspecified, uncomplicated: Secondary | ICD-10-CM

## 2019-04-23 DIAGNOSIS — IMO0001 Reserved for inherently not codable concepts without codable children: Secondary | ICD-10-CM

## 2019-04-23 NOTE — Progress Notes (Signed)
Cardiology Office Note:    Date:  04/23/2019   ID:  Luke [name], DOB [birth year], MRN [id number]  PCP:  Olin Hauser, DO  Cardiologist:  Kate Sable, MD  Electrophysiologist:  None   Referring MD: Nobie Putnam *   Chief Complaint  Patient presents with  . Other    Referred by Dr. Parks Ranger for fatigue and SOE. Meds reviewed verbally with patient.    Luke [name] is a 69 y.o. male who is being seen today for the evaluation of fatigue, leg pain at the request of Nobie Putnam *.   History of Present Illness:    Luke [name] is a 69 y.o. male with a hx of anxiety, COPD, current smoker x50+ years who presents due to fatigue and chest discomfort. Patient was diagnosed with COVID-19 on 02/26/2019 at a urgent care. 2 days later he was managed with an IV antibiotic infusion. He has had frequent visits with primary care provider for persistent fatigue for the past 1-1/2 months he was diagnosed with Covid. He denies overt shortness of breath, but states having occasional chest discomfort not associated with exertion. Describes symptoms as chest pressure, rates it as a 6 out of 10 in severity, lasting about 30 minutes. There are no exacerbating or relieving factors. He also endorses leg cramping with walking. Cramping is usually in the calves and buttock area, occurring with exertion, relieved with rest or standing. He is a current smoker x50 years. He is trying to quit smoking. Was prescribed a nicotine patch which he is currently using. Denies any history of heart disease.    Past Medical History:  Diagnosis Date  . Anxiety   . Arthritis   . Carcinoma metastatic to lymph node ([place])   . Cataract    right  . COPD (chronic obstructive pulmonary disease) ([place])   . Depression   . GERD (gastroesophageal reflux disease)   . Hoarseness of voice   . Personal history of tobacco use, presenting hazards to health February of 2001  . Thyroid disease      Past Surgical History:  Procedure Laterality Date  . BALLOON DILATION N/A 01/30/2018   Procedure: BALLOON DILATION;  Surgeon: Mauri Pole, MD;  Location: [facility] ENDOSCOPY;  Service: Endoscopy;  Laterality: N/A;  . COLONOSCOPY    . COLONOSCOPY WITH PROPOFOL N/A 11/29/2016   Procedure: COLONOSCOPY WITH PROPOFOL;  Surgeon: Manya Silvas, MD;  Location: [facility] ENDOSCOPY;  Service: Endoscopy;  Laterality: N/A;  . COLONOSCOPY WITH PROPOFOL N/A 08/22/2017   Procedure: COLONOSCOPY WITH PROPOFOL;  Surgeon: Jonathon Bellows, MD;  Location: [facility] ENDOSCOPY;  Service: Gastroenterology;  Laterality: N/A;  . ESOPHAGEAL MANOMETRY N/A 10/05/2017   Procedure: ESOPHAGEAL MANOMETRY (EM);  Surgeon: Jonathon Bellows, MD;  Location: [facility] ENDOSCOPY;  Service: Gastroenterology;  Laterality: N/A;  . ESOPHAGEAL MANOMETRY N/A 01/30/2018   Procedure: ESOPHAGEAL MANOMETRY (EM);  Surgeon: Mauri Pole, MD;  Location: [facility] ENDOSCOPY;  Service: Endoscopy;  Laterality: N/A;  . ESOPHAGOGASTRODUODENOSCOPY N/A 03/09/2018   Procedure: ESOPHAGOGASTRODUODENOSCOPY (EGD);  Surgeon: Milus Banister, MD;  Location: [facility] ENDOSCOPY;  Service: Endoscopy;  Laterality: N/A;  . ESOPHAGOGASTRODUODENOSCOPY (EGD) WITH PROPOFOL N/A 11/29/2016   Procedure: ESOPHAGOGASTRODUODENOSCOPY (EGD) WITH PROPOFOL;  Surgeon: Manya Silvas, MD;  Location: [facility] ENDOSCOPY;  Service: Endoscopy;  Laterality: N/A;  . ESOPHAGOGASTRODUODENOSCOPY (EGD) WITH PROPOFOL N/A 08/22/2017   Procedure: ESOPHAGOGASTRODUODENOSCOPY (EGD) WITH PROPOFOL;  Surgeon: Jonathon Bellows, MD;  Location: [facility] ENDOSCOPY;  Service: Gastroenterology;  Laterality: N/A;  . ESOPHAGOGASTRODUODENOSCOPY (EGD) WITH  PROPOFOL N/A 01/30/2018   Procedure: ESOPHAGOGASTRODUODENOSCOPY (EGD) WITH PROPOFOL;  Surgeon: Mauri Pole, MD;  Location: [facility] ENDOSCOPY;  Service: Endoscopy;  Laterality: N/A;  Mano probe to be placed during EGD  . EUS N/A 03/09/2018   Procedure: UPPER ENDOSCOPIC ULTRASOUND (EUS)  RADIAL;  Surgeon: Milus Banister, MD;  Location: [facility] ENDOSCOPY;  Service: Endoscopy;  Laterality: N/A;  . NECK SURGERY     status post diagnosis of cancer with chemo and radiation  . SUBMUCOSAL INJECTION  03/09/2018   Procedure: SUBMUCOSAL INJECTION;  Surgeon: Milus Banister, MD;  Location: [facility] ENDOSCOPY;  Service: Endoscopy;;  . TONSILLECTOMY      Current Medications: Current Meds  Medication Sig  . albuterol (PROVENTIL) (2.5 MG/3ML) 0.083% nebulizer solution Take 3 mLs (2.5 mg total) by nebulization every 2 (two) hours as needed for wheezing. Dx: copd exacerbation J44.1  . albuterol (VENTOLIN HFA) 108 (90 Base) MCG/ACT inhaler Inhale 2 puffs into the lungs every 4 (four) hours as needed for wheezing or shortness of breath.  Marland Kitchen buPROPion (WELLBUTRIN SR) 150 MG 12 hr tablet Take 1 tablet (150 mg total) by mouth 2 (two) times daily.  . fluticasone (FLONASE) 50 MCG/ACT nasal spray Place 2 sprays into both nostrils daily. Use for 4-6 weeks then stop and use seasonally or as needed.  Marland Kitchen levothyroxine (SYNTHROID) 88 MCG tablet TAKE 1 TABLET BY MOUTH EVERY DAY ON AN EMPTY STOMACH  . meloxicam (MOBIC) 15 MG tablet TAKE 1 TABLET(15 MG) BY MOUTH DAILY  . Multiple Vitamin (MULTIVITAMIN WITH MINERALS) TABS tablet Take 1 tablet by mouth daily. One-A-Day Multivitamin  . nicotine (NICODERM CQ) 21 mg/24hr patch Place 1 patch (21 mg total) onto the skin daily.  . nicotine polacrilex (NICORETTE) 4 MG gum Take 1 each (4 mg total) by mouth as needed for smoking cessation.  Marland Kitchen omeprazole (PRILOSEC) 20 MG capsule Take 1 capsule (20 mg total) by mouth daily before breakfast.  . sertraline (ZOLOFT) 50 MG tablet Take 1 tablet (50 mg total) by mouth daily.  Marland Kitchen umeclidinium-vilanterol (ANORO ELLIPTA) 62.5-25 MCG/INH AEPB Inhale 1 puff into the lungs daily.  . vitamin B-12 (CYANOCOBALAMIN) 500 MCG tablet Take 500 mcg by mouth 2 (two) times daily.  . vitamin E (VITAMIN E) 180 MG (400 UNITS) capsule Take 400 Units by mouth  daily.  Marland Kitchen zinc sulfate 220 (50 Zn) MG capsule Take 220 mg by mouth daily.     Allergies:   Patient has no known allergies.   Social History   Socioeconomic History  . Marital status: Single    Spouse name: Not on file  . Number of children: 1  . Years of education: [employer]  . Highest education level: High school graduate  Occupational History  . Not on file  Tobacco Use  . Smoking status: Current Some Day Smoker    Packs/day: 2.00    Years: 51.00    Pack years: 102.00    Types: Cigarettes    Start date: 53  . Smokeless tobacco: Never Used  . Tobacco comment: 04/20/19 down to a pack per day   Substance and Sexual Activity  . Alcohol use: No  . Drug use: No  . Sexual activity: Not on file  Other Topics Concern  . Not on file  Social History Narrative  . Not on file   Social Determinants of Health   Financial Resource Strain:   . Difficulty of Paying Living Expenses:   Food Insecurity:   . Worried About Running  Out of Food in the Last Year:   . [place] in the Last Year:   Transportation Needs:   . Lack of Transportation (Medical):   Marland Kitchen Lack of Transportation (Non-Medical):   Physical Activity:   . Days of Exercise per Week:   . Minutes of Exercise per Session:   Stress:   . Feeling of Stress :   Social Connections:   . Frequency of Communication with Friends and Family:   . Frequency of Social Gatherings with Friends and Family:   . Attends Religious Services:   . Active Member of Clubs or Organizations:   . Attends Archivist Meetings:   Marland Kitchen Marital Status:      Family History: The patient's family history includes Breast cancer in his mother; Cancer in his mother; Heart attack in his father; Heart disease in his father and mother.  ROS:   Please see the history of present illness.     All other systems reviewed and are negative.  EKGs/Labs/Other Studies Reviewed:    The following studies were reviewed today:   EKG:  EKG is   ordered today.  The ekg ordered today demonstrates normal sinus rhythm, normal ECG.  Recent Labs: 02/09/2019: ALT 13; TSH 3.00 03/21/2019: BUN 29; Creatinine, Ser 1.30; Hemoglobin 14.7; Platelets 310; Potassium 4.2; Sodium 140  Recent Lipid Panel    Component Value Date/Time   CHOL 186 02/09/2019 1003   TRIG 75 02/09/2019 1003   HDL 38 (L) 02/09/2019 1003   CHOLHDL 4.9 02/09/2019 1003   LDLCALC 131 (H) 02/09/2019 1003    Physical Exam:    VS:  BP 132/80 (BP Location: Right Arm, Patient Position: Sitting, Cuff Size: Normal)   Pulse 74   Ht 6\' 1"  (1.854 m)   Wt 196 lb (88.9 kg)   SpO2 93%   BMI 25.86 kg/m     Wt Readings from Last 3 Encounters:  04/23/19 196 lb (88.9 kg)  04/20/19 193 lb 6.4 oz (87.7 kg)  04/09/19 191 lb (86.6 kg)     GEN:  Well nourished, well developed in no acute distress HEENT: Normal NECK: No JVD; No carotid bruits LYMPHATICS: No lymphadenopathy CARDIAC: RRR, no murmurs, rubs, gallops RESPIRATORY: Decreased breath sounds, clear ABDOMEN: Soft, non-tender, non-distended MUSCULOSKELETAL:  No edema; No deformity  SKIN: Warm and dry NEUROLOGIC:  Alert and oriented x 3 PSYCHIATRIC:  Normal affect   ASSESSMENT:    1. Chest pain, unspecified type   2. Claudication ([place])   3. Smoking   4. Fatigue, unspecified type    PLAN:    In order of problems listed above:  1. Patient with atypical symptoms of chest discomfort. He has risk factors for age and long smoking history. Will evaluate cardiac function with an echocardiogram, get Lexiscan stress test to evaluate for ischemia. 2. He describes symptoms of claudication, has risk factors of long smoking history. We'll get an ABI and peripheral arterial ultrasound to evaluate for PAD. 3. He is a current smoker x50 years. Currently working on smoking cessation. Encouraged to quit smoking. 4. Patient has a 69-month history of fatigue after COVID-19 infection. 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 He denies  shortness of breath with activity.  Follow-up after echo, stress test, lower extremity arterial ultrasound.  This note was generated in part or whole with voice recognition software. Voice recognition is usually quite accurate but there are transcription errors that can and very often do occur. I apologize for any typographical  errors that were not detected and corrected.   Medication Adjustments/Labs and Tests Ordered: Current medicines are reviewed at length with the patient today.  Concerns regarding medicines are outlined above.  Orders Placed This Encounter  Procedures  . NM Myocar Multi W/Spect W/Wall Motion / EF  . EKG 12-Lead  . ECHOCARDIOGRAM COMPLETE  . VAS [place] ABI WITH/WO TBI  . VAS [place] LOWER EXTREMITY ARTERIAL DUPLEX   No orders of the defined types were placed in this encounter.   Patient Instructions  Medication Instructions:  Your physician recommends that you continue on your current medications as directed. Please refer to the Current Medication list given to you today.  *If you need a refill on your cardiac medications before your next appointment, please call your pharmacy*   Lab Work: none If you have labs (blood work) drawn today and your tests are completely normal, you will receive your results only by: Marland Kitchen MyChart Message (if you have MyChart) OR . A paper copy in the mail If you have any lab test that is abnormal or we need to change your treatment, we will call you to review the results.   Testing/Procedures: 1- ECHOCARDIOGRAM - Your physician has requested that you have an echocardiogram. Echocardiography is a painless test that uses sound waves to create images of your heart. It provides your doctor with information about the size and shape of your heart and how well your heart's chambers and valves are working. This procedure takes approximately one hour. There are no restrictions for this procedure. You may get an IV, if needed, to receive an ultrasound  enhancing agent through to better visualize your heart.   2- Lower extremity arterial doppler and ABI's Your physician has requested that you have a lower extremity arterial doppler- During this test, ultrasound is used to evaluate arterial blood flow in the legs. Allow approximately one hour for this exam.   Your physician has requested that you have an ankle brachial index (ABI). During this test an ultrasound and blood pressure cuff are used to evaluate the arteries that supply the arms and legs with blood. Allow thirty minutes for this exam. There are no restrictions or special instructions.   3- LEXISCAN MYOVIEW STRESS TEST. Your physician has requested that you have a lexiscan myoview. For further information please visit [email or web address]. Please follow instruction sheet, as given.  [place]  Your caregiver has ordered a Stress Test with nuclear imaging. The purpose of this test is to evaluate the blood supply to your heart muscle. This procedure is referred to as a "Non-Invasive Stress Test." This is because other than having an IV started in your vein, nothing is inserted or "invades" your body. Cardiac stress tests are done to find areas of poor blood flow to the heart by determining the extent of coronary artery disease (CAD). Some patients exercise on a treadmill, which naturally increases the blood flow to your heart, while others who are  unable to walk on a treadmill due to physical limitations have a pharmacologic/chemical stress agent called Lexiscan . This medicine will mimic walking on a treadmill by temporarily increasing your coronary blood flow.   Please note: these test may take anywhere between 2-4 hours to complete  PLEASE REPORT TO [employer] AT THE FIRST DESK WILL DIRECT YOU WHERE TO GO  Date of Procedure:_____________________________________  Arrival Time for Procedure:______________________________  PLEASE NOTIFY THE OFFICE AT  LEAST 24 HOURS IN ADVANCE IF YOU ARE  UNABLE TO KEEP YOUR APPOINTMENT.  [phone] AND  PLEASE NOTIFY NUCLEAR MEDICINE AT [facility] AT LEAST 24 HOURS IN ADVANCE IF YOU ARE UNABLE TO KEEP YOUR APPOINTMENT. [phone]  How to prepare for your Myoview test:  1. Do not eat or drink after midnight 2. No caffeine for 24 hours prior to test 3. No smoking 24 hours prior to test. 4. Your medication may be taken with water.  If your doctor stopped a medication because of this test, do not take that medication. 5. Ladies, please do not wear dresses.  Skirts or pants are appropriate. Please wear a short sleeve shirt. 6. No perfume, cologne or lotion. 7. Wear comfortable walking shoes.    Follow-Up: At [facility], you and your health needs are our priority.  As part of our continuing mission to provide you with exceptional heart care, we have created designated Provider Care Teams.  These Care Teams include your primary Cardiologist (physician) and Advanced Practice Providers (APPs -  Physician Assistants and Nurse Practitioners) who all work together to provide you with the care you need, when you need it.  We recommend signing up for the patient portal called "MyChart".  Sign up information is provided on this After Visit Summary.  MyChart is used to connect with patients for Virtual Visits (Telemedicine).  Patients are able to view lab/test results, encounter notes, upcoming appointments, etc.  Non-urgent messages can be sent to your provider as well.   To learn more about what you can do with MyChart, go to [email or web address].    Your next appointment:   After testing completed.   The format for your next appointment:   In Person  Provider:   Kate Sable, MD   Cardiac Nuclear Scan A cardiac nuclear scan is a test that measures blood flow to the heart when a person is resting and when he or she is exercising. The test looks for problems such as:  Not enough blood reaching a  portion of the heart.  The heart muscle not working normally. You may need this test if:  You have heart disease.  You have had abnormal lab results.  You have had heart surgery or a balloon procedure to open up blocked arteries (angioplasty).  You have chest pain.  You have shortness of breath. In this test, a radioactive dye (tracer) is injected into your bloodstream. After the tracer has traveled to your heart, an imaging device is used to measure how much of the tracer is absorbed by or distributed to various areas of your heart. This procedure is usually done at a hospital and takes 2-4 hours. Tell a health care provider about:  Any allergies you have.  All medicines you are taking, including vitamins, herbs, eye drops, creams, and over-the-counter medicines.  Any problems you or family members have had with anesthetic medicines.  Any blood disorders you have.  Any surgeries you have had.  Any medical conditions you have.  Whether you are pregnant or may be pregnant. What are the risks? Generally, this is a safe procedure. However, problems may occur, including:  Serious chest pain and heart attack. This is only a risk if the stress portion of the test is done.  Rapid heartbeat.  Sensation of warmth in your chest. This usually passes quickly.  Allergic reaction to the tracer. What happens before the procedure?  Ask your health care provider about changing or stopping your regular medicines. This is especially important if you are taking diabetes medicines or  blood thinners.  Follow instructions from your health care provider about eating or drinking restrictions.  Remove your jewelry on the day of the procedure. What happens during the procedure?  An IV will be inserted into one of your veins.  Your health care provider will inject a small amount of radioactive tracer through the IV.  You will wait for 20-40 minutes while the tracer travels through your  bloodstream.  Your heart activity will be monitored with an electrocardiogram (ECG).  You will lie down on an exam table.  Images of your heart will be taken for about 15-20 minutes.  You may also have a stress test. For this test, one of the following may be done: ? You will exercise on a treadmill or stationary bike. While you exercise, your heart's activity will be monitored with an ECG, and your blood pressure will be checked. ? You will be given medicines that will increase blood flow to parts of your heart. This is done if you are unable to exercise.  When blood flow to your heart has peaked, a tracer will again be injected through the IV.  After 20-40 minutes, you will get back on the exam table and have more images taken of your heart.  Depending on the type of tracer used, scans may need to be repeated 3-4 hours later.  Your IV line will be removed when the procedure is over. The procedure may vary among health care providers and hospitals. What happens after the procedure?  Unless your health care provider tells you otherwise, you may return to your normal schedule, including diet, activities, and medicines.  Unless your health care provider tells you otherwise, you may increase your fluid intake. This will help to flush the contrast dye from your body. Drink enough fluid to keep your urine pale yellow.  Ask your health care provider, or the department that is doing the test: ? When will my results be ready? ? How will I get my results? Summary  A cardiac nuclear scan measures the blood flow to the heart when a person is resting and when he or she is exercising.  Tell your health care provider if you are pregnant.  Before the procedure, ask your health care provider about changing or stopping your regular medicines. This is especially important if you are taking diabetes medicines or blood thinners.  After the procedure, unless your health care provider tells you  otherwise, increase your fluid intake. This will help flush the contrast dye from your body.  After the procedure, unless your health care provider tells you otherwise, you may return to your normal schedule, including diet, activities, and medicines. This information is not intended to replace advice given to you by your health care provider. Make sure you discuss any questions you have with your health care provider. Document Revised: 06/13/2017 Document Reviewed: 06/13/2017 Elsevier Patient Education  [place].    Echocardiogram An echocardiogram is a procedure that uses painless sound waves (ultrasound) to produce an image of the heart. Images from an echocardiogram can provide important information about:  Signs of coronary artery disease (CAD).  Aneurysm detection. An aneurysm is a weak or damaged part of an artery wall that bulges out from the normal force of blood pumping through the body.  Heart size and shape. Changes in the size or shape of the heart can be associated with certain conditions, including heart failure, aneurysm, and CAD.  Heart muscle function.  Heart valve function.  Signs of a past heart attack.  Fluid buildup around the heart.  Thickening of the heart muscle.  A tumor or infectious growth around the heart valves. Tell a health care provider about:  Any allergies you have.  All medicines you are taking, including vitamins, herbs, eye drops, creams, and over-the-counter medicines.  Any blood disorders you have.  Any surgeries you have had.  Any medical conditions you have.  Whether you are pregnant or may be pregnant. What are the risks? Generally, this is a safe procedure. However, problems may occur, including:  Allergic reaction to dye (contrast) that may be used during the procedure. What happens before the procedure? No specific preparation is needed. You may eat and drink normally. What happens during the procedure?   An IV  tube may be inserted into one of your veins.  You may receive contrast through this tube. A contrast is an injection that improves the quality of the pictures from your heart.  A gel will be applied to your chest.  A wand-like tool (transducer) will be moved over your chest. The gel will help to transmit the sound waves from the transducer.  The sound waves will harmlessly bounce off of your heart to allow the heart images to be captured in real-time motion. The images will be recorded on a computer. The procedure may vary among health care providers and hospitals. What happens after the procedure?  You may return to your normal, everyday life, including diet, activities, and medicines, unless your health care provider tells you not to do that. Summary  An echocardiogram is a procedure that uses painless sound waves (ultrasound) to produce an image of the heart.  Images from an echocardiogram can provide important information about the size and shape of your heart, heart muscle function, heart valve function, and fluid buildup around your heart.  You do not need to do anything to prepare before this procedure. You may eat and drink normally.  After the echocardiogram is completed, you may return to your normal, everyday life, unless your health care provider tells you not to do that. This information is not intended to replace advice given to you by your health care provider. Make sure you discuss any questions you have with your health care provider. Document Revised: 04/20/2018 Document Reviewed: 01/31/2016 Elsevier Patient Education  2020 [place], Kate Sable, MD  04/23/2019 9:58 AM    [place]

## 2019-04-23 NOTE — Patient Instructions (Addendum)
Medication Instructions:  Your physician recommends that you continue on your current medications as directed. Please refer to the Current Medication list given to you today.  *If you need a refill on your cardiac medications before your next appointment, please call your pharmacy*   Lab Work: none If you have labs (blood work) drawn today and your tests are completely normal, you will receive your results only by: Marland Kitchen MyChart Message (if you have MyChart) OR . A paper copy in the mail If you have any lab test that is abnormal or we need to change your treatment, we will call you to review the results.   Testing/Procedures: 1- ECHOCARDIOGRAM - Your physician has requested that you have an echocardiogram. Echocardiography is a painless test that uses sound waves to create images of your heart. It provides your doctor with information about the size and shape of your heart and how well your heart's chambers and valves are working. This procedure takes approximately one hour. There are no restrictions for this procedure. You may get an IV, if needed, to receive an ultrasound enhancing agent through to better visualize your heart.   2- Lower extremity arterial doppler and ABI's Your physician has requested that you have a lower extremity arterial doppler- During this test, ultrasound is used to evaluate arterial blood flow in the legs. Allow approximately one hour for this exam.   Your physician has requested that you have an ankle brachial index (ABI). During this test an ultrasound and blood pressure cuff are used to evaluate the arteries that supply the arms and legs with blood. Allow thirty minutes for this exam. There are no restrictions or special instructions.   3- LEXISCAN MYOVIEW STRESS TEST. Your physician has requested that you have a lexiscan myoview. For further information please visit [email or web address]. Please follow instruction sheet, as given.  [place]  Your caregiver  has ordered a Stress Test with nuclear imaging. The purpose of this test is to evaluate the blood supply to your heart muscle. This procedure is referred to as a "Non-Invasive Stress Test." This is because other than having an IV started in your vein, nothing is inserted or "invades" your body. Cardiac stress tests are done to find areas of poor blood flow to the heart by determining the extent of coronary artery disease (CAD). Some patients exercise on a treadmill, which naturally increases the blood flow to your heart, while others who are  unable to walk on a treadmill due to physical limitations have a pharmacologic/chemical stress agent called Lexiscan . This medicine will mimic walking on a treadmill by temporarily increasing your coronary blood flow.   Please note: these test may take anywhere between 2-4 hours to complete  PLEASE REPORT TO [place] AT THE FIRST DESK WILL DIRECT YOU WHERE TO GO  Date of Procedure:_____________________________________  Arrival Time for Procedure:______________________________  PLEASE NOTIFY THE OFFICE AT LEAST 24 HOURS IN ADVANCE IF YOU ARE UNABLE TO KEEP YOUR APPOINTMENT.  [phone] AND  PLEASE NOTIFY NUCLEAR MEDICINE AT [facility] AT LEAST 24 HOURS IN ADVANCE IF YOU ARE UNABLE TO KEEP YOUR APPOINTMENT. [phone]  How to prepare for your Myoview test:  1. Do not eat or drink after midnight 2. No caffeine for 24 hours prior to test 3. No smoking 24 hours prior to test. 4. Your medication may be taken with water.  If your doctor stopped a medication because of this test, do not take that medication. 5. Ladies,  please do not wear dresses.  Skirts or pants are appropriate. Please wear a short sleeve shirt. 6. No perfume, cologne or lotion. 7. Wear comfortable walking shoes.    Follow-Up: At [facility], you and your health needs are our priority.  As part of our continuing mission to provide you with exceptional  heart care, we have created designated Provider Care Teams.  These Care Teams include your primary Cardiologist (physician) and Advanced Practice Providers (APPs -  Physician Assistants and Nurse Practitioners) who all work together to provide you with the care you need, when you need it.  We recommend signing up for the patient portal called "MyChart".  Sign up information is provided on this After Visit Summary.  MyChart is used to connect with patients for Virtual Visits (Telemedicine).  Patients are able to view lab/test results, encounter notes, upcoming appointments, etc.  Non-urgent messages can be sent to your provider as well.   To learn more about what you can do with MyChart, go to [email or web address].    Your next appointment:   After testing completed.   The format for your next appointment:   In Person  Provider:   Kate Sable, MD   Cardiac Nuclear Scan A cardiac nuclear scan is a test that measures blood flow to the heart when a person is resting and when he or she is exercising. The test looks for problems such as:  Not enough blood reaching a portion of the heart.  The heart muscle not working normally. You may need this test if:  You have heart disease.  You have had abnormal lab results.  You have had heart surgery or a balloon procedure to open up blocked arteries (angioplasty).  You have chest pain.  You have shortness of breath. In this test, a radioactive dye (tracer) is injected into your bloodstream. After the tracer has traveled to your heart, an imaging device is used to measure how much of the tracer is absorbed by or distributed to various areas of your heart. This procedure is usually done at a hospital and takes 2-4 hours. Tell a health care provider about:  Any allergies you have.  All medicines you are taking, including vitamins, herbs, eye drops, creams, and over-the-counter medicines.  Any problems you or family members have had with  anesthetic medicines.  Any blood disorders you have.  Any surgeries you have had.  Any medical conditions you have.  Whether you are pregnant or may be pregnant. What are the risks? Generally, this is a safe procedure. However, problems may occur, including:  Serious chest pain and heart attack. This is only a risk if the stress portion of the test is done.  Rapid heartbeat.  Sensation of warmth in your chest. This usually passes quickly.  Allergic reaction to the tracer. What happens before the procedure?  Ask your health care provider about changing or stopping your regular medicines. This is especially important if you are taking diabetes medicines or blood thinners.  Follow instructions from your health care provider about eating or drinking restrictions.  Remove your jewelry on the day of the procedure. What happens during the procedure?  An IV will be inserted into one of your veins.  Your health care provider will inject a small amount of radioactive tracer through the IV.  You will wait for 20-40 minutes while the tracer travels through your bloodstream.  Your heart activity will be monitored with an electrocardiogram (ECG).  You will lie down on an  exam table.  Images of your heart will be taken for about 15-20 minutes.  You may also have a stress test. For this test, one of the following may be done: ? You will exercise on a treadmill or stationary bike. While you exercise, your heart's activity will be monitored with an ECG, and your blood pressure will be checked. ? You will be given medicines that will increase blood flow to parts of your heart. This is done if you are unable to exercise.  When blood flow to your heart has peaked, a tracer will again be injected through the IV.  After 20-40 minutes, you will get back on the exam table and have more images taken of your heart.  Depending on the type of tracer used, scans may need to be repeated 3-4 hours  later.  Your IV line will be removed when the procedure is over. The procedure may vary among health care providers and hospitals. What happens after the procedure?  Unless your health care provider tells you otherwise, you may return to your normal schedule, including diet, activities, and medicines.  Unless your health care provider tells you otherwise, you may increase your fluid intake. This will help to flush the contrast dye from your body. Drink enough fluid to keep your urine pale yellow.  Ask your health care provider, or the department that is doing the test: ? When will my results be ready? ? How will I get my results? Summary  A cardiac nuclear scan measures the blood flow to the heart when a person is resting and when he or she is exercising.  Tell your health care provider if you are pregnant.  Before the procedure, ask your health care provider about changing or stopping your regular medicines. This is especially important if you are taking diabetes medicines or blood thinners.  After the procedure, unless your health care provider tells you otherwise, increase your fluid intake. This will help flush the contrast dye from your body.  After the procedure, unless your health care provider tells you otherwise, you may return to your normal schedule, including diet, activities, and medicines. This information is not intended to replace advice given to you by your health care provider. Make sure you discuss any questions you have with your health care provider. Document Revised: 06/13/2017 Document Reviewed: 06/13/2017 Elsevier Patient Education  [place].    Echocardiogram An echocardiogram is a procedure that uses painless sound waves (ultrasound) to produce an image of the heart. Images from an echocardiogram can provide important information about:  Signs of coronary artery disease (CAD).  Aneurysm detection. An aneurysm is a weak or damaged part of an  artery wall that bulges out from the normal force of blood pumping through the body.  Heart size and shape. Changes in the size or shape of the heart can be associated with certain conditions, including heart failure, aneurysm, and CAD.  Heart muscle function.  Heart valve function.  Signs of a past heart attack.  Fluid buildup around the heart.  Thickening of the heart muscle.  A tumor or infectious growth around the heart valves. Tell a health care provider about:  Any allergies you have.  All medicines you are taking, including vitamins, herbs, eye drops, creams, and over-the-counter medicines.  Any blood disorders you have.  Any surgeries you have had.  Any medical conditions you have.  Whether you are pregnant or may be pregnant. What are the risks? Generally, this is a  safe procedure. However, problems may occur, including:  Allergic reaction to dye (contrast) that may be used during the procedure. What happens before the procedure? No specific preparation is needed. You may eat and drink normally. What happens during the procedure?   An IV tube may be inserted into one of your veins.  You may receive contrast through this tube. A contrast is an injection that improves the quality of the pictures from your heart.  A gel will be applied to your chest.  A wand-like tool (transducer) will be moved over your chest. The gel will help to transmit the sound waves from the transducer.  The sound waves will harmlessly bounce off of your heart to allow the heart images to be captured in real-time motion. The images will be recorded on a computer. The procedure may vary among health care providers and hospitals. What happens after the procedure?  You may return to your normal, everyday life, including diet, activities, and medicines, unless your health care provider tells you not to do that. Summary  An echocardiogram is a procedure that uses painless sound waves  (ultrasound) to produce an image of the heart.  Images from an echocardiogram can provide important information about the size and shape of your heart, heart muscle function, heart valve function, and fluid buildup around your heart.  You do not need to do anything to prepare before this procedure. You may eat and drink normally.  After the echocardiogram is completed, you may return to your normal, everyday life, unless your health care provider tells you not to do that. This information is not intended to replace advice given to you by your health care provider. Make sure you discuss any questions you have with your health care provider. Document Revised: 04/20/2018 Document Reviewed: 01/31/2016 Elsevier Patient Education  [place].

## 2019-04-30 ENCOUNTER — Other Ambulatory Visit: Payer: Self-pay

## 2019-04-30 ENCOUNTER — Encounter
Admission: RE | Admit: 2019-04-30 | Discharge: 2019-04-30 | Disposition: A | Discharge status: Home / Self Care | Payer: BC Managed Care – PPO | Source: Ambulatory Visit | Attending: Cardiology | Admitting: Cardiology

## 2019-04-30 DIAGNOSIS — R079 Chest pain, unspecified: Secondary | ICD-10-CM | POA: Diagnosis not present

## 2019-04-30 LAB — NM MYOCAR MULTI W/SPECT W/WALL MOTION / EF
LV dias vol: 104 mL (ref 62–150)
LV sys vol: 35 mL
Peak HR: 85 {beats}/min
Percent HR: 55 %
Rest HR: 66 {beats}/min
SDS: 0
SRS: 2
SSS: 0
TID: 0.92

## 2019-04-30 MED ORDER — TECHNETIUM TC 99M TETROFOSMIN IV KIT
31.2200 | PACK | Freq: Once | INTRAVENOUS | Status: AC | PRN
  Administered 2019-04-30: 31.22 via INTRAVENOUS

## 2019-04-30 MED ORDER — REGADENOSON 0.4 MG/5ML IV SOLN
0.4000 mg | Freq: Once | INTRAVENOUS | Status: AC
  Administered 2019-04-30: 0.4 mg via INTRAVENOUS

## 2019-04-30 MED ORDER — TECHNETIUM TC 99M TETROFOSMIN IV KIT
10.8000 | PACK | Freq: Once | INTRAVENOUS | Status: AC | PRN
  Administered 2019-04-30: 08:00:00 10.8 via INTRAVENOUS

## 2019-05-03 ENCOUNTER — Ambulatory Visit (INDEPENDENT_AMBULATORY_CARE_PROVIDER_SITE_OTHER): Payer: BC Managed Care – PPO | Admitting: Internal Medicine

## 2019-05-03 ENCOUNTER — Other Ambulatory Visit: Payer: Self-pay

## 2019-05-03 ENCOUNTER — Encounter: Payer: Self-pay | Admitting: Internal Medicine

## 2019-05-03 VITALS — BP 94/60 | HR 82 | Temp 98.2°F | Ht 73.0 in | Wt 196.2 lb

## 2019-05-03 DIAGNOSIS — F17210 Nicotine dependence, cigarettes, uncomplicated: Secondary | ICD-10-CM

## 2019-05-03 DIAGNOSIS — J449 Chronic obstructive pulmonary disease, unspecified: Secondary | ICD-10-CM

## 2019-05-03 DIAGNOSIS — U071 COVID-19: Secondary | ICD-10-CM

## 2019-05-03 DIAGNOSIS — J432 Centrilobular emphysema: Secondary | ICD-10-CM | POA: Diagnosis not present

## 2019-05-03 DIAGNOSIS — J1282 Pneumonia due to coronavirus disease 2019: Secondary | ICD-10-CM

## 2019-05-03 MED ORDER — ALBUTEROL SULFATE HFA 108 (90 BASE) MCG/ACT IN AERS: 2.0000 | INHALATION_SPRAY | RESPIRATORY_TRACT | 10 refills | Status: DC | PRN

## 2019-05-03 MED ORDER — ANORO ELLIPTA 62.5-25 MCG/INH IN AEPB: 1.0000 | INHALATION_SPRAY | Freq: Every day | RESPIRATORY_TRACT | 10 refills | Status: DC

## 2019-05-03 MED ORDER — ALBUTEROL SULFATE (2.5 MG/3ML) 0.083% IN NEBU: 2.5000 mg | INHALATION_SOLUTION | RESPIRATORY_TRACT | 10 refills | Status: DC | PRN

## 2019-05-03 NOTE — Progress Notes (Signed)
@Patient  ID: Luke [name], male    DOB: 09-27-1950, 69 y.o.   MRN: [id number]   HPI:  69 year old male current smoker followed in our office for centrilobular emphysema.  Patient is status post COVID-19 infection February/2021.  PMH: GERD, hypertension, hypothyroidism, prediabetes Smoker/ Smoking History: Smoker. 1 ppd. 102 pack year history.  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  Tests:  02/26/2019-SARS-CoV-2-positive 02/28/2019-monoclonal antibody infusion 01/03/2019-CT chest lung cancer screening-lung RADS 1S, negative, repeat screening in 12 months, mild centrilobular and paraseptal emphysema   Chief complaint Follow-up shortness of breath and dyspnea on exertion Follow-up COPD  HPI  05/03/2019  - Visit  69 year old white male current smoker Has a history of COPD emphysema Patient recently diagnosed with COVID-19 pneumonia and infection Patient diagnosed February 2021 with COVID-19 infection Was seen in urgent care and was diagnosed with SARS-CoV-2 infection Patient received monoclonal antibody infusion on February 28 2019 Since then patient has progressive fatigue and shortness of breath With a lack of energy I advised patient to be tested for exertional and nocturnal hypoxia  Patient has been on prednisone antibiotics on and off for the last several months No imaging reported since contracting Covid  Patient is miserable after he contracted the virus Patient is unable to work at this time  Smoking Assessment and Cessation Counseling   Upon further questioning, Patient smokes 1 ppd  I have advised patient to quit/stop smoking as soon as possible due to high risk for multiple medical problems  Patient is willing to quit smoking  I have advised patient that we can assist and have options of Nicotine replacement therapy. I also advised patient on behavioral therapy and can provide oral medication therapy in conjunction with the other  therapies  Follow up next Office visit  for assessment of smoking cessation  Smoking cessation counseling advised for 4 minutes     Lab Results:  CBC    Component Value Date/Time   WBC 11.6 (H) 03/21/2019 1238   RBC 4.93 03/21/2019 1238   HGB 14.7 03/21/2019 1238   HGB 14.6 08/20/2013 1504   HCT 44.7 03/21/2019 1238   HCT 43.7 08/20/2013 1504   PLT 310 03/21/2019 1238   PLT 221 08/20/2013 1504   MCV 90.7 03/21/2019 1238   MCV 94 08/20/2013 1504   MCH 29.8 03/21/2019 1238   MCHC 32.9 03/21/2019 1238   RDW 13.0 03/21/2019 1238   RDW 13.1 08/20/2013 1504   LYMPHSABS 1,310 02/09/2019 1003   LYMPHSABS 1.6 08/20/2013 1504   MONOABS 0.4 02/13/2016 1320   MONOABS 0.6 08/20/2013 1504   EOSABS 130 02/09/2019 1003   EOSABS 0.2 08/20/2013 1504   BASOSABS 35 02/09/2019 1003   BASOSABS 0.1 08/20/2013 1504    BMET    Component Value Date/Time   NA 140 03/21/2019 1238   NA 141 08/20/2013 1504   K 4.2 03/21/2019 1238   K 4.1 08/20/2013 1504   CL 103 03/21/2019 1238   CL 105 08/20/2013 1504   CO2 27 03/21/2019 1238   CO2 29 08/20/2013 1504   GLUCOSE 97 03/21/2019 1238   GLUCOSE 123 (H) 08/20/2013 1504   BUN 29 (H) 03/21/2019 1238   BUN 29 (H) 08/20/2013 1504   CREATININE 1.30 (H) 03/21/2019 1238   CREATININE 1.05 02/09/2019 1003   CALCIUM 9.6 03/21/2019 1238   CALCIUM 8.8 08/20/2013 1504   GFRNONAA 57 (L) 03/21/2019 1238   GFRNONAA 74 02/09/2019 1003   GFRAA >60 03/21/2019 [place]  85 02/09/2019 1003    BNP    Component Value Date/Time   BNP 105.0 (H) 07/01/2017 1837    ProBNP No results found for: PROBNP  Specialty Problems      Pulmonary Problems   Centrilobular emphysema (HCC)   Dysphagia, pharyngeal phase      No Known Allergies  Immunization History  Administered Date(s) Administered  . Influenza Inj Mdck Quad With Preservative 10/21/2016  . Influenza,inj,Quad PF,6+ Mos 10/09/2015  . Influenza-Unspecified 11/04/2017  . Pneumococcal  Conjugate-13 02/16/2019  . Pneumococcal Polysaccharide-23 07/02/2017  . Zoster 09/05/2013    Past Medical History:  Diagnosis Date  . Anxiety   . Arthritis   . Carcinoma metastatic to lymph node ([place])   . Cataract    right  . COPD (chronic obstructive pulmonary disease) ([place])   . Depression   . GERD (gastroesophageal reflux disease)   . Hoarseness of voice   . Personal history of tobacco use, presenting hazards to health February of 2001  . Thyroid disease     Tobacco History: Social History   Tobacco Use  Smoking Status Current Some Day Smoker  . Packs/day: 2.00  . Years: 51.00  . Pack years: 102.00  . Types: Cigarettes  . Start date: 1970  Smokeless Tobacco Never Used  Tobacco Comment   04/20/19 down to a pack per day    Ready to quit: Not Answered Counseling given: Not Answered Comment: 04/20/19 down to a pack per day        Outpatient Encounter Medications as of 05/03/2019  Medication Sig  . albuterol (PROVENTIL) (2.5 MG/3ML) 0.083% nebulizer solution Take 3 mLs (2.5 mg total) by nebulization every 2 (two) hours as needed for wheezing. Dx: copd exacerbation J44.1  . albuterol (VENTOLIN HFA) 108 (90 Base) MCG/ACT inhaler Inhale 2 puffs into the lungs every 4 (four) hours as needed for wheezing or shortness of breath.  Marland Kitchen buPROPion (WELLBUTRIN SR) 150 MG 12 hr tablet Take 1 tablet (150 mg total) by mouth 2 (two) times daily.  . fluticasone (FLONASE) 50 MCG/ACT nasal spray Place 2 sprays into both nostrils daily. Use for 4-6 weeks then stop and use seasonally or as needed.  Marland Kitchen levothyroxine (SYNTHROID) 88 MCG tablet TAKE 1 TABLET BY MOUTH EVERY DAY ON AN EMPTY STOMACH  . meloxicam (MOBIC) 15 MG tablet TAKE 1 TABLET(15 MG) BY MOUTH DAILY  . Multiple Vitamin (MULTIVITAMIN WITH MINERALS) TABS tablet Take 1 tablet by mouth daily. One-A-Day Multivitamin  . nicotine (NICODERM CQ) 21 mg/24hr patch Place 1 patch (21 mg total) onto the skin daily.  . nicotine polacrilex  (NICORETTE) 4 MG gum Take 1 each (4 mg total) by mouth as needed for smoking cessation.  Marland Kitchen omeprazole (PRILOSEC) 20 MG capsule Take 1 capsule (20 mg total) by mouth daily before breakfast.  . sertraline (ZOLOFT) 50 MG tablet Take 1 tablet (50 mg total) by mouth daily.  Marland Kitchen umeclidinium-vilanterol (ANORO ELLIPTA) 62.5-25 MCG/INH AEPB Inhale 1 puff into the lungs daily.  . vitamin B-12 (CYANOCOBALAMIN) 500 MCG tablet Take 500 mcg by mouth 2 (two) times daily.  . vitamin E (VITAMIN E) 180 MG (400 UNITS) capsule Take 400 Units by mouth daily.  Marland Kitchen zinc sulfate 220 (50 Zn) MG capsule Take 220 mg by mouth daily.   No facility-administered encounter medications on file as of 05/03/2019.     Review of Systems  Review of Systems  Constitutional: Positive for fatigue. Negative for activity change, chills, fever and unexpected weight change.  HENT: Positive for congestion. Negative for postnasal drip, rhinorrhea, sinus pressure, sinus pain and sore throat.   Eyes: Negative.   Respiratory: Positive for cough (prod dark brown mucous ) and shortness of breath. Negative for wheezing.   Cardiovascular: Negative for chest pain and palpitations.  Gastrointestinal: Negative for constipation, diarrhea, nausea and vomiting.  Endocrine: Negative.   Genitourinary: Negative.   Musculoskeletal: Positive for myalgias.  Skin: Negative.   Neurological: Positive for weakness and headaches. Negative for dizziness.  Psychiatric/Behavioral: Negative.  Negative for dysphoric mood. The patient is not nervous/anxious.   All other systems reviewed and are negative.      Wt Readings from Last 5 Encounters:  04/23/19 196 lb (88.9 kg)  04/20/19 193 lb 6.4 oz (87.7 kg)  04/09/19 191 lb (86.6 kg)  03/21/19 195 lb (88.5 kg)  03/14/19 193 lb (87.5 kg)    BMI Readings from Last 5 Encounters:  04/23/19 25.86 kg/m  04/20/19 25.52 kg/m  04/09/19 25.20 kg/m  03/21/19 25.73 kg/m  03/14/19 25.46 kg/m     Physical  Exam Vitals and nursing note reviewed.  Constitutional:      General: He is not in acute distress.    Appearance: Normal appearance. He is normal weight.  HENT:     Head: Normocephalic and atraumatic.     Right Ear: Hearing, tympanic membrane, ear canal and external ear normal. There is no impacted cerumen.     Left Ear: Hearing, tympanic membrane, ear canal and external ear normal. There is no impacted cerumen.     Nose: Nose normal. No mucosal edema or rhinorrhea.     Right Turbinates: Not enlarged.     Left Turbinates: Not enlarged.     Mouth/Throat:     Mouth: Mucous membranes are dry.     Pharynx: Oropharynx is clear. No oropharyngeal exudate.  Eyes:     Pupils: Pupils are equal, round, and reactive to light.  Cardiovascular:     Rate and Rhythm: Normal rate and regular rhythm.     Pulses: Normal pulses.     Heart sounds: Normal heart sounds. No murmur.  Pulmonary:     Effort: Pulmonary effort is normal. No respiratory distress.     Breath sounds: No decreased breath sounds, wheezing or rales.     Comments: Diminished breath sounds.exam Musculoskeletal:     Cervical back: Normal range of motion.     Right lower leg: No edema.     Left lower leg: No edema.  Lymphadenopathy:     Cervical: No cervical adenopathy.  Skin:    General: Skin is warm and dry.     Capillary Refill: Capillary refill takes less than 2 seconds.     Findings: No erythema or rash.  Neurological:     General: No focal deficit present.     Mental Status: He is alert and oriented to person, place, and time.     Motor: No weakness.     Coordination: Coordination normal.     Gait: Gait is intact. Gait (Tolerated walk in office) normal.  Psychiatric:        Mood and Affect: Mood normal.        Behavior: Behavior normal. Behavior is cooperative.        Thought Content: Thought content normal.        Judgment: Judgment normal.       Assessment & Plan:    Recent COVID-19 infection pneumonia Tested  + 02/26/2019 Received monoclonal antibody infusion 02/28/2019 Reports  significant improvement after receiving monoclonal antibody infusion Patient has ongoing symptoms of fatigue and shortness of breath along with muscle weakness   Progressive shortness of breath and dyspnea on exertion At this time I need to assess for exertional and nocturnal hypoxia 6-minute walk test and Ono test ordered  Dysphagia History of head and neck cancer Patient encouraged to use chin tuck maneuvers for swallowing  COPD-102-pack-year smoking history Continue inhalers as prescribed Continue albuterol as needed  Smoking cessation strongly advised  Heavy smoker with COPD Recommend lung cancer screening referral program     COVID-19 EDUCATION: The signs and symptoms of COVID-19 were discussed with the patient and how to seek care for testing.  The importance of social distancing was discussed today. Hand Washing Techniques and avoid touching face was advised.     MEDICATION ADJUSTMENTS/LABS AND TESTS ORDERED: Continue inhalers as prescribed Check 6-minute walk test for hypoxia Check overnight pulse oximetry for hypoxia  Please stop smoking   CURRENT MEDICATIONS REVIEWED AT LENGTH WITH PATIENT TODAY   Patient satisfied with Plan of action and management. All questions answered  Follow up in 6 months   Total time spent 31 minutes   Corrin Parker, M.D.  [facility]  Medical Director [place] Director [facility] Cardio-Pulmonary Department

## 2019-05-03 NOTE — Patient Instructions (Signed)
Continue inhalers as prescribed Check 6-minute walk test for hypoxia Check overnight pulse oximetry for hypoxia  Please stop smoking

## 2019-05-04 DIAGNOSIS — H401131 Primary open-angle glaucoma, bilateral, mild stage: Secondary | ICD-10-CM | POA: Diagnosis not present

## 2019-05-09 ENCOUNTER — Other Ambulatory Visit: Payer: BC Managed Care – PPO

## 2019-05-09 ENCOUNTER — Ambulatory Visit: Payer: BC Managed Care – PPO | Admitting: Family Medicine

## 2019-05-09 ENCOUNTER — Other Ambulatory Visit: Payer: Self-pay

## 2019-05-10 ENCOUNTER — Encounter: Payer: Self-pay | Admitting: Family Medicine

## 2019-05-10 ENCOUNTER — Telehealth (INDEPENDENT_AMBULATORY_CARE_PROVIDER_SITE_OTHER): Payer: BC Managed Care – PPO | Admitting: Family Medicine

## 2019-05-10 DIAGNOSIS — Z8616 Personal history of COVID-19: Secondary | ICD-10-CM

## 2019-05-10 DIAGNOSIS — R0600 Dyspnea, unspecified: Secondary | ICD-10-CM | POA: Diagnosis not present

## 2019-05-10 DIAGNOSIS — G933 Postviral fatigue syndrome: Secondary | ICD-10-CM

## 2019-05-10 DIAGNOSIS — G9331 Postviral fatigue syndrome: Secondary | ICD-10-CM

## 2019-05-10 DIAGNOSIS — R0609 Other forms of dyspnea: Secondary | ICD-10-CM

## 2019-05-10 DIAGNOSIS — J432 Centrilobular emphysema: Secondary | ICD-10-CM | POA: Diagnosis not present

## 2019-05-10 NOTE — Progress Notes (Signed)
Virtual Visit via Telephone The purpose of this virtual visit is to provide medical care while limiting exposure to the novel coronavirus (COVID19) for both patient and office staff.  Consent was obtained for phone visit:  Yes.   Answered questions that patient had about telehealth interaction:  Yes.   I discussed the limitations, risks, security and privacy concerns of performing an evaluation and management service by telephone. I also discussed with the patient that there may be a patient responsible charge related to this service. The patient expressed understanding and agreed to proceed.  Patient Location: Home Provider Location: [facility])  ---------------------------------------------------------------------- Chief Complaint  Patient presents with  . FMLA paper work  . COVID19 post syndrome / Fatigue Dyspnea    S: Reviewed CMA documentation. I have called patient and gathered additional HPI as follows:  FATIGUE / POST VIRAL SYNDROME / COVID19 DYSPNEA ON EXERTION COPD Initial symptoms onset 02/25/19 out of work due to [employer]. Diagnosed on 02/26/19 by urgent care. 02/28/19 had IV antibody infusion. Treated on 03/14/19 for virtual visit for COVID19 positive, had been treated with recurrence of symptoms with flare up of underlying acute COPD exacerbation as well likely, given longer course of Prednisone course, and also nebulizer treatments at home. He was advised on 03/20/19 to seek care w/ Pulmonology specialist. They advised him to go to hospital ED at that time. He went to [facility] ED on 03/21/19. Diagnosed with dehydration, and had elevated Creatinine to 1.3 He was given IV fluid rehydration and had some mild hypotension. He did not have concerning respiratory symptoms at that time. He was to continue vitamins and nutritional support. Last seen by me virtually on 04/09/19 for persistent fatigue and unable to work, he was experiencing significant dyspnea on exertion and  fatigue even with mild or limited activity unable to work and muscle aching soreness and fatigue.  He has remained out of work.  Interval updates:  Cardiology - Seen by [facility] Garen Lah (04/23/19) - had atypical symptoms of chest discomfort and was advised to have nuclear stress and ECHO to evaluate cardiovascular disease. Result was low risk on nuclear stress.  Pulmonology - [facility] Mortimer Fries on 05/03/19 - evaluated at that time, concern for his dyspnea and concerned for COPD vs Post COVID syndrome. He will have 6 min walk and overnight oximetry testing done, not completed yet.  Today he returns due to persistent fatigue, and overall limited improvement in past several weeks. He is still unable to return to work.  On disability short term still at this time.  Admits chronic cough with COPD  Denies fever chills sweats nausea vomiting, abdominal pain, chest pain, near syncope or syncope   ADJUSTMENT DISORDER Last visit he had some depressive type symptoms as a result of his breathing and reduced function He was started on short course of SSRI Sertraline 50mg  daily. Seems some mild improvement, without worsening, see scores below.   Past Medical History:  Diagnosis Date  . Anxiety   . Arthritis   . Carcinoma metastatic to lymph node ([place])   . Cataract    right  . COPD (chronic obstructive pulmonary disease) ([place])   . Depression   . GERD (gastroesophageal reflux disease)   . Hoarseness of voice   . Personal history of tobacco use, presenting hazards to health February of 2001  . Thyroid disease    Social History   Tobacco Use  . Smoking status: Current Some Day Smoker  Packs/day: 2.00    Years: 51.00    Pack years: 102.00    Types: Cigarettes    Start date: 35  . Smokeless tobacco: Current User  . Tobacco comment: 05/03/19- down to half a pack or less / day  Substance Use Topics  . Alcohol use: No  . Drug use: No    Current  Outpatient Medications:  .  albuterol (PROVENTIL) (2.5 MG/3ML) 0.083% nebulizer solution, Take 3 mLs (2.5 mg total) by nebulization every 2 (two) hours as needed for wheezing. Dx: copd exacerbation J44.1, Disp: 120 mL, Rfl: 10 .  albuterol (VENTOLIN HFA) 108 (90 Base) MCG/ACT inhaler, Inhale 2 puffs into the lungs every 4 (four) hours as needed for wheezing or shortness of breath., Disp: 1 g, Rfl: 10 .  buPROPion (WELLBUTRIN SR) 150 MG 12 hr tablet, Take 1 tablet (150 mg total) by mouth 2 (two) times daily., Disp: 60 tablet, Rfl: 1 .  fluticasone (FLONASE) 50 MCG/ACT nasal spray, Place 2 sprays into both nostrils daily. Use for 4-6 weeks then stop and use seasonally or as needed., Disp: 16 g, Rfl: 3 .  levothyroxine (SYNTHROID) 88 MCG tablet, TAKE 1 TABLET BY MOUTH EVERY DAY ON AN EMPTY STOMACH, Disp: 90 tablet, Rfl: 1 .  meloxicam (MOBIC) 15 MG tablet, TAKE 1 TABLET(15 MG) BY MOUTH DAILY, Disp: 30 tablet, Rfl: 2 .  Multiple Vitamin (MULTIVITAMIN WITH MINERALS) TABS tablet, Take 1 tablet by mouth daily. One-A-Day Multivitamin, Disp: , Rfl:  .  nicotine (NICODERM CQ) 21 mg/24hr patch, Place 1 patch (21 mg total) onto the skin daily., Disp: 28 patch, Rfl: 1 .  nicotine polacrilex (NICORETTE) 4 MG gum, Take 1 each (4 mg total) by mouth as needed for smoking cessation., Disp: 100 tablet, Rfl: 3 .  omeprazole (PRILOSEC) 20 MG capsule, Take 1 capsule (20 mg total) by mouth daily before breakfast., Disp: 90 capsule, Rfl: 1 .  sertraline (ZOLOFT) 50 MG tablet, Take 1 tablet (50 mg total) by mouth daily., Disp: 30 tablet, Rfl: 3 .  umeclidinium-vilanterol (ANORO ELLIPTA) 62.5-25 MCG/INH AEPB, Inhale 1 puff into the lungs daily., Disp: 60 each, Rfl: 10 .  vitamin B-12 (CYANOCOBALAMIN) 500 MCG tablet, Take 500 mcg by mouth 2 (two) times daily., Disp: , Rfl:  .  vitamin E (VITAMIN E) 180 MG (400 UNITS) capsule, Take 400 Units by mouth daily., Disp: , Rfl:  .  zinc sulfate 220 (50 Zn) MG capsule, Take 220 mg by  mouth daily., Disp: , Rfl:   Depression screen [facility] 2/9 05/10/2019 04/09/2019 03/28/2019  Decreased Interest 3 3 0  Down, Depressed, Hopeless 1 2 0  PHQ - 2 Score 4 5 0  Altered sleeping 2 1 -  Tired, decreased energy 3 3 -  Change in appetite 0 1 -  Feeling bad or failure about yourself  3 3 -  Trouble concentrating 3 2 -  Moving slowly or fidgety/restless 0 2 -  Suicidal thoughts 1 2 -  PHQ-9 Score 16 19 -  Difficult doing work/chores Somewhat difficult Extremely dIfficult -    GAD 7 : Generalized Anxiety Score 05/10/2019 04/09/2019  Nervous, Anxious, on Edge 1 2  Control/stop worrying 3 3  Worry too much - different things 3 2  Trouble relaxing 1 2  Restless 0 1  Easily annoyed or irritable 3 1  Afraid - awful might happen 3 2  Total GAD 7 Score 14 13  Anxiety Difficulty Somewhat difficult Extremely difficult    -------------------------------------------------------------------------- O:  No physical exam performed due to remote telephone encounter.  Lab results reviewed.  CLINICAL DATA:  69 year old male current smoker with 93 pack-year history of smoking. Lung cancer screening examination.  EXAM: CT CHEST WITHOUT CONTRAST LOW-DOSE FOR LUNG CANCER SCREENING  TECHNIQUE: Multidetector CT imaging of the chest was performed following the standard protocol without IV contrast.  COMPARISON:  Low-dose lung cancer screening chest CT 11/30/2017.  FINDINGS: Cardiovascular: Heart size is normal. There is no significant pericardial fluid, thickening or pericardial calcification. There is aortic atherosclerosis, as well as atherosclerosis of the great vessels of the mediastinum and the coronary arteries, including calcified atherosclerotic plaque in the left main, left anterior descending, left circumflex and right coronary arteries. Ectasia of the ascending thoracic aorta (4.3 cm in diameter).  Mediastinum/Nodes: No pathologically enlarged mediastinal or hilar lymph  nodes. Please note that accurate exclusion of hilar adenopathy is limited on noncontrast CT scans. Esophagus is unremarkable in appearance. No axillary lymphadenopathy.  Lungs/Pleura: No suspicious appearing pulmonary nodules or masses are noted. No acute consolidative airspace disease. No pleural effusions. Mild diffuse bronchial wall thickening with mild centrilobular and paraseptal emphysema. Mild bilateral apical nodular pleuroparenchymal thickening and architectural distortion, most compatible with areas of chronic post infectious or inflammatory scarring.  Upper Abdomen: Aortic atherosclerosis.  Musculoskeletal: There are no aggressive appearing lytic or blastic lesions noted in the visualized portions of the skeleton.  IMPRESSION: 1. Lung-RADS 1S, negative. Continue annual screening with low-dose chest CT without contrast in 12 months. 2. The "S" modifier above refers to potentially clinically significant non lung cancer related findings. Specifically, there is aortic atherosclerosis, in addition to left main and 3 vessel coronary artery disease. Please note that although the presence of coronary artery calcium documents the presence of coronary artery disease, the severity of this disease and any potential stenosis cannot be assessed on this non-gated CT examination. Assessment for potential risk factor modification, dietary therapy or pharmacologic therapy may be warranted, if clinically indicated. 3. Ectasia of ascending thoracic aorta (4.3 cm in diameter). Attention at time of repeat annual lung cancer screening chest CT is recommended to ensure continued stability. 4. Mild diffuse bronchial wall thickening with mild centrilobular and paraseptal emphysema; imaging findings suggestive of underlying COPD.  Aortic Atherosclerosis (ICD10-I70.0) and Emphysema (ICD10-J43.9).   Electronically Signed   By: Vinnie Langton M.D.   On: 01/03/2019 14:41  Recent  Results (from the past 2160 hour(s))  Novel Coronavirus, NAA (Hosp order, Send-out to Ref Lab; TAT 18-24 hrs     Status: Abnormal   Collection Time: 02/26/19  4:46 PM   Specimen: Nasopharyngeal Swab; Respiratory  Result Value Ref Range   SARS-CoV-2, NAA DETECTED (A) NOT DETECTED    Comment: RESULT CALLED TO, READ BACK BY AND VERIFIED WITH: NOTIFIED LINDSEY WESTBROOKS 02/27/2019 1556 (NOTE)                  Client Requested Flag This nucleic acid amplification test was developed and its performance characteristics determined by [employer]. Nucleic acid amplification tests include RT-PCR and TMA. This test has not been FDA cleared or approved. This test has been authorized by FDA under an Emergency Use Authorization (EUA). This test is only authorized for the duration of time the declaration that circumstances exist justifying the authorization of the emergency use of in vitro diagnostic tests for detection of SARS-CoV-2 virus and/or diagnosis of COVID-19 infection under section 564(b)(1) of the Act, 21 U.S.C. [id number]) (1), unless the authorization is terminated or revoked  sooner. When diagnostic testing is negative, the possibility of a false negative result should be considered in the context of a patient's recent exposures and the presence o f clinical signs and symptoms consistent with COVID-19. An individual without symptoms of COVID- 19 and who is not shedding SARS-CoV-2 virus would expect to have a negative (not detected) result in this assay. Performed At: [facility] [place], [place] [id number] Rush Farmer MD [id number] Performed at [facility], [address] [place], [place] [address]    Coronavirus Source NASOPHARYNGEAL     Comment: Performed at [facility], [address], [place], [place] [address]  Basic metabolic panel     Status: Abnormal   Collection Time: 03/21/19 12:38 PM  Result Value Ref  Range   Sodium 140 135 - 145 mmol/L   Potassium 4.2 3.5 - 5.1 mmol/L   Chloride 103 98 - 111 mmol/L   CO2 27 22 - 32 mmol/L   Glucose, Bld 97 70 - 99 mg/dL    Comment: Glucose reference range applies only to samples taken after fasting for at least 8 hours.   BUN 29 (H) 8 - 23 mg/dL   Creatinine, Ser 1.30 (H) 0.61 - 1.24 mg/dL   Calcium 9.6 8.9 - 10.3 mg/dL   GFR calc non Af Amer 57 (L) >60 mL/min   GFR calc Af Amer >60 >60 mL/min   Anion gap 10 5 - 15    Comment: Performed at [facility], [address] [place], [place] [address]  CBC     Status: Abnormal   Collection Time: 03/21/19 12:38 PM  Result Value Ref Range   WBC 11.6 (H) 4.0 - 10.5 K/uL   RBC 4.93 4.22 - 5.81 MIL/uL   Hemoglobin 14.7 13.0 - 17.0 g/dL   HCT 44.7 39.0 - 52.0 %   MCV 90.7 80.0 - 100.0 fL   MCH 29.8 26.0 - 34.0 pg   MCHC 32.9 30.0 - 36.0 g/dL   RDW 13.0 11.5 - 15.5 %   Platelets 310 150 - 400 K/uL   nRBC 0.0 0.0 - 0.2 %    Comment: Performed at [facility], [address] [place], [place] [address]  Urinalysis, Complete w Microscopic     Status: Abnormal   Collection Time: 03/21/19  3:13 PM  Result Value Ref Range   Color, Urine YELLOW (A) YELLOW   APPearance HAZY (A) CLEAR   Specific Gravity, Urine 1.014 1.005 - 1.030   pH 6.0 5.0 - 8.0   Glucose, UA NEGATIVE NEGATIVE mg/dL   Hgb urine dipstick NEGATIVE NEGATIVE   Bilirubin Urine NEGATIVE NEGATIVE   Ketones, ur NEGATIVE NEGATIVE mg/dL   Protein, ur NEGATIVE NEGATIVE mg/dL   Nitrite NEGATIVE NEGATIVE   Leukocytes,Ua NEGATIVE NEGATIVE   RBC / HPF 0-5 0 - 5 RBC/hpf   WBC, UA 0-5 0 - 5 WBC/hpf   Bacteria, UA NONE SEEN NONE SEEN   Squamous Epithelial / LPF NONE SEEN 0 - 5   Hyaline Casts, UA PRESENT     Comment: Performed at [facility], [address] [place],  [address]  NM Myocar Multi W/Spect W/Wall Motion / EF     Status: None   Collection Time: 04/30/19 10:04 AM  Result Value Ref Range   Rest HR 66  bpm   Rest BP 120/92 mmHg   Percent HR 55 %   Peak HR 85 bpm   Peak BP 113/72 mmHg  SSS 0    SRS 2    SDS 0    TID 0.92    LV sys vol 35 mL   LV dias vol 104 62 - 150 mL    -------------------------------------------------------------------------- A&P:  Problem List Items Addressed This Visit    History of COVID-19   Centrilobular emphysema (HCC) - Primary    Other Visit Diagnoses    Postviral fatigue syndrome       Dyspnea on exertion         #Post viral fatigue Syndrome / History of COVID / COPD / dyspnea on exertion  Clinically still persistent complication post covid now about 2 month, without improvement recently Concern that he has severe COPD and complex respiratory history with known difficulties with dysphagia in past has caused complicated course, and he is high risk of complications from 0000000  He has been hospitalized and well treated for COVID acutely  Interval progress with Cardiology evaluation has not shown significant cardiovascular disease at this time. He is pursuing Pulmonology work up at this time. Awaiting on 6 minute walk testing and overnight oximetry testing and further management.  No further acute changes today. He should continue on current therapy and follow up Pulmonology  He should remain out of work still at this time, requesting extension up to 3 more months. He was possibly returning near 05/12/19 however I would extend that until 08/12/19.  Letter written for his employer HR with this information and aiming for estimated return to work date 08/12/19 approximately, pending further evaluation.  -----------------------------  #Adjustment disorder acute depressed No history of clinical depression however he is experiencing depressed mood due to acute illness and limited recovery at this time Seems some improvement on Sertraline 50mg  daily, would consider dose adjust in future if needed, for now can keep longer at same dose   No orders of  the defined types were placed in this encounter.   Follow-up: - Return in 3 months as needed or sooner for return to work  Patient verbalizes understanding with the above medical recommendations including the limitation of remote medical advice.  Specific follow-up and call-back criteria were given for patient to follow-up or seek medical care more urgently if needed.   - Time spent in direct consultation with patient on phone: 7 minutes   Nobie Putnam, [facility] 05/10/2019, 10:45 AM

## 2019-05-10 NOTE — Patient Instructions (Addendum)
Letter written and faxed  Please schedule a Follow-up Appointment to: Return in about 3 months (around 08/09/2019) for 3 month return to work / breathing.  If you have any other questions or concerns, please feel free to call the office or send a message through [place]. You may also schedule an earlier appointment if necessary.  Additionally, you may be receiving a survey about your experience at our office within a few days to 1 week by e-mail or mail. We value your feedback.  Nobie Putnam, DO [place]

## 2019-05-11 ENCOUNTER — Other Ambulatory Visit
Admission: RE | Admit: 2019-05-11 | Discharge: 2019-05-11 | Disposition: A | Discharge status: Home / Self Care | Payer: BC Managed Care – PPO | Source: Ambulatory Visit | Attending: Pulmonary Disease | Admitting: Pulmonary Disease

## 2019-05-11 DIAGNOSIS — Z20822 Contact with and (suspected) exposure to covid-19: Secondary | ICD-10-CM | POA: Insufficient documentation

## 2019-05-11 DIAGNOSIS — Z01812 Encounter for preprocedural laboratory examination: Secondary | ICD-10-CM | POA: Diagnosis not present

## 2019-05-11 LAB — SARS CORONAVIRUS 2 (TAT 6-24 HRS): SARS Coronavirus 2: NEGATIVE

## 2019-05-14 ENCOUNTER — Encounter: Payer: Self-pay | Admitting: Pulmonary Disease

## 2019-05-14 ENCOUNTER — Ambulatory Visit (INDEPENDENT_AMBULATORY_CARE_PROVIDER_SITE_OTHER): Payer: BC Managed Care – PPO | Admitting: Pulmonary Disease

## 2019-05-14 ENCOUNTER — Other Ambulatory Visit: Payer: Self-pay

## 2019-05-14 ENCOUNTER — Ambulatory Visit (INDEPENDENT_AMBULATORY_CARE_PROVIDER_SITE_OTHER): Payer: BC Managed Care – PPO | Admitting: Internal Medicine

## 2019-05-14 VITALS — BP 118/82 | HR 88 | Temp 97.9°F | Ht 73.0 in | Wt 195.0 lb

## 2019-05-14 DIAGNOSIS — Z8616 Personal history of COVID-19: Secondary | ICD-10-CM

## 2019-05-14 DIAGNOSIS — Z720 Tobacco use: Secondary | ICD-10-CM | POA: Diagnosis not present

## 2019-05-14 DIAGNOSIS — R1313 Dysphagia, pharyngeal phase: Secondary | ICD-10-CM | POA: Diagnosis not present

## 2019-05-14 DIAGNOSIS — J432 Centrilobular emphysema: Secondary | ICD-10-CM

## 2019-05-14 DIAGNOSIS — R0602 Shortness of breath: Secondary | ICD-10-CM

## 2019-05-14 LAB — PULMONARY FUNCTION TEST
DL/VA % pred: 111 %
DL/VA: 4.52 ml/min/mmHg/L
DLCO cor % pred: 96 %
DLCO cor: 27.95 ml/min/mmHg
DLCO unc % pred: 96 %
DLCO unc: 28.03 ml/min/mmHg
FEF 25-75 Post: 2.92 L/sec
FEF 25-75 Pre: 2.12 L/sec
FEF2575-%Change-Post: 37 %
FEF2575-%Pred-Post: 98 %
FEF2575-%Pred-Pre: 71 %
FEV1-%Change-Post: 8 %
FEV1-%Pred-Post: 79 %
FEV1-%Pred-Pre: 72 %
FEV1-Post: 3 L
FEV1-Pre: 2.76 L
FEV1FVC-%Change-Post: 0 %
FEV1FVC-%Pred-Pre: 99 %
FEV6-%Change-Post: 8 %
FEV6-%Pred-Post: 83 %
FEV6-%Pred-Pre: 76 %
FEV6-Post: 4.02 L
FEV6-Pre: 3.71 L
FEV6FVC-%Change-Post: 0 %
FEV6FVC-%Pred-Post: 104 %
FEV6FVC-%Pred-Pre: 104 %
FVC-%Change-Post: 8 %
FVC-%Pred-Post: 79 %
FVC-%Pred-Pre: 73 %
FVC-Post: 4.06 L
FVC-Pre: 3.73 L
Post FEV1/FVC ratio: 74 %
Post FEV6/FVC ratio: 99 %
Pre FEV1/FVC ratio: 74 %
Pre FEV6/FVC Ratio: 100 %
RV % pred: 124 %
RV: 3.15 L
TLC % pred: 91 %
TLC: 6.99 L

## 2019-05-14 NOTE — Assessment & Plan Note (Signed)
102-pack-year smoking history Current smoker Working to decrease smoking Adherent to [employer] Status post Covid in February/2021 COPD gold 2 off of pulmonary function testing today when you factor in the fact the patient took Anoro Ellipta this morning  Plan: Proceed forward with pulmonary rehab referral Continue Anoro Ellipta We will order high-resolution CT chest to further evaluate interstitial coarsening seen on x-ray Emphasized importance to stop smoking We will establish care with new pulmonologist in our practice

## 2019-05-14 NOTE — Progress Notes (Signed)
@Patient  ID: Luke [name], male    DOB: 06-28-1950, 69 y.o.   MRN: [id number]  Chief Complaint  Patient presents with  . Follow-up    F/U after PFT. States his breathing has been stable since last visit.     Referring provider: Nobie Putnam *  HPI:  69 year old male current smoker followed in our office for centrilobular emphysema.  Patient is status post COVID-19 infection February/2021.  PMH: GERD, hypertension, hypothyroidism, prediabetes Smoker/ Smoking History: Smoker. 1 ppd. 102 pack year history.  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  Patient is also status post COVID-19 infection in February/2021.  He did receive the monoclonal antibody infusion.  He did not require hospitalization.  December/2020 CT chest in the lung cancer screening program read as a lung RADS 1S with mild centrilobular and paraseptal emphysema.  Patient is currently maintained on Anoro Ellipta.  He is completing pulmonary function testing with our office today those results are listed below:  05/14/2019-pulmonary function test-FVC 3.73 (73% predicted), postbronchodilator ratio 74, postbronchodilator FEV1 3.00 (79% predicted), no bronchodilator response, DLCO 28.03 (96% predicted) >>>Patient did use Anoro this morning as well as his albuterol  Patient is also high risk for aspiration pneumonia.  He is supposed to be using chin tuck maneuvers.  Patient reports that he feels a chin tuck maneuvers are helping.  Patient is actively working to stop smoking.  He is down to 6 to 7 cigarettes a day.  He still needs to set a quit date.  He is currently using nicotine replacement therapy such as nicotine patches as well as lozenges.  He is also been started on Wellbutrin.  He is found both of these helpful.  Patient continues to have symptoms of long term fatigue since contracting COVID-19.   He also has myalgias.  He is currently seeing cardiology as well as primary care.  Patient has a pending referral to pulmonary rehab.  He is also hopeful to return to work this month.  Questionaires / Pulmonary Flowsheets:   MMRC: mMRC Dyspnea Scale mMRC Score  05/14/2019 0  04/20/2019 3   Tests:   02/26/2019-SARS-CoV-2-positive 02/28/2019-monoclonal antibody infusion  01/03/2019-CT chest lung cancer screening-lung RADS 1S, negative, repeat screening in 12 months, mild centrilobular and paraseptal emphysema  04/20/2019-chest x-ray-mild interstitial coarsening, no focal consolidation  05/14/2019-pulmonary function test-FVC 3.73 (73% predicted), postbronchodilator ratio 74, postbronchodilator FEV1 3.00 (79% predicted), no bronchodilator response, DLCO 28.03 (96% predicted) >>>Patient did use Anoro this morning as well as his albuterol  FENO:  No results found for: NITRICOXIDE  PFT: PFT Results Latest Ref Rng & Units 05/14/2019  FVC-Pre L 3.73  FVC-Predicted Pre % 73  FVC-Post L 4.06  FVC-Predicted Post % 79  Pre FEV1/FVC % % 74  Post FEV1/FCV % % 74  FEV1-Pre L 2.76  FEV1-Predicted Pre % 72  FEV1-Post L 3.00  DLCO UNC% % 96  DLCO COR %Predicted % 111  TLC L 6.99  TLC % Predicted % 91  RV % Predicted % 124    WALK:  SIX MIN WALK 04/20/2019  Supplimental Oxygen during Test? (L/min) No  Tech Comments: had to stop after 2 laps because his legs were hurting him.    Imaging: DG Chest 2 View  Result Date: 04/20/2019 CLINICAL DATA:  69 year old male with weakness. History of recent COVID. EXAM: CHEST - 2 VIEW COMPARISON:  Chest radiograph  dated 10/29/2018. FINDINGS: There is mild diffuse interstitial coarsening and minimal nodularity, likely chronic. No focal consolidation, pleural effusion, pneumothorax. The cardiac silhouette is within limits. No acute osseous pathology. IMPRESSION: Mild interstitial coarsening.  No focal consolidation. Electronically Signed   By: Anner Crete M.D.    On: 04/20/2019 16:54   NM Myocar Multi W/Spect W/Wall Motion / EF  Result Date: 04/30/2019  No T wave inversion was noted during stress.  There was no ST segment deviation noted during stress.  The study is normal. Suboptimal study due to GI uptake interfering with inferior wall.  This is a low risk study.  Calculated ejection fraction was 38%. However, contractility appears normal visually. Suspect miscalculation due to poor tracing of the myocardium. Correlate with an echo.     Lab Results:  CBC    Component Value Date/Time   WBC 11.6 (H) 03/21/2019 1238   RBC 4.93 03/21/2019 1238   HGB 14.7 03/21/2019 1238   HGB 14.6 08/20/2013 1504   HCT 44.7 03/21/2019 1238   HCT 43.7 08/20/2013 1504   PLT 310 03/21/2019 1238   PLT 221 08/20/2013 1504   MCV 90.7 03/21/2019 1238   MCV 94 08/20/2013 1504   MCH 29.8 03/21/2019 1238   MCHC 32.9 03/21/2019 1238   RDW 13.0 03/21/2019 1238   RDW 13.1 08/20/2013 1504   LYMPHSABS 1,310 02/09/2019 1003   LYMPHSABS 1.6 08/20/2013 1504   MONOABS 0.4 02/13/2016 1320   MONOABS 0.6 08/20/2013 1504   EOSABS 130 02/09/2019 1003   EOSABS 0.2 08/20/2013 1504   BASOSABS 35 02/09/2019 1003   BASOSABS 0.1 08/20/2013 1504    BMET    Component Value Date/Time   NA 140 03/21/2019 1238   NA 141 08/20/2013 1504   K 4.2 03/21/2019 1238   K 4.1 08/20/2013 1504   CL 103 03/21/2019 1238   CL 105 08/20/2013 1504   CO2 27 03/21/2019 1238   CO2 29 08/20/2013 1504   GLUCOSE 97 03/21/2019 1238   GLUCOSE 123 (H) 08/20/2013 1504   BUN 29 (H) 03/21/2019 1238   BUN 29 (H) 08/20/2013 1504   CREATININE 1.30 (H) 03/21/2019 1238   CREATININE 1.05 02/09/2019 1003   CALCIUM 9.6 03/21/2019 1238   CALCIUM 8.8 08/20/2013 1504   GFRNONAA 57 (L) 03/21/2019 1238   GFRNONAA 74 02/09/2019 1003   GFRAA >60 03/21/2019 1238   GFRAA 85 02/09/2019 1003    BNP    Component Value Date/Time   BNP 105.0 (H) 07/01/2017 1837    ProBNP No results found for:  PROBNP  Specialty Problems      Pulmonary Problems   Centrilobular emphysema (HCC)   Dysphagia, pharyngeal phase      No Known Allergies  Immunization History  Administered Date(s) Administered  . Influenza Inj Mdck Quad With Preservative 10/21/2016  . Influenza,inj,Quad PF,6+ Mos 10/09/2015  . Influenza-Unspecified 11/04/2017  . Pneumococcal Conjugate-13 02/16/2019  . Pneumococcal Polysaccharide-23 07/02/2017  . Zoster 09/05/2013    Past Medical History:  Diagnosis Date  . Anxiety   . Arthritis   . Carcinoma metastatic to lymph node ([place])   . Cataract    right  . COPD (chronic obstructive pulmonary disease) ([place])   . Depression   . GERD (gastroesophageal reflux disease)   . Hoarseness of voice   . Personal history of tobacco use, presenting hazards to health February of 2001  . Thyroid disease     Tobacco History: Social History   Tobacco Use  Smoking  Status Current Some Day Smoker  . Packs/day: 2.00  . Years: 51.00  . Pack years: 102.00  . Types: Cigarettes  . Start date: 1970  Smokeless Tobacco Current User  Tobacco Comment   05/03/19- down to half a pack or less / day   Ready to quit: Yes Counseling given: Yes Comment: 05/03/19- down to half a pack or less / day  Smoking assessment and cessation counseling  Patient currently smoking: 6 to 7 cigarettes a day I have advised the patient to quit/stop smoking as soon as possible due to high risk for multiple medical problems.  It will also be very difficult for [place] to manage patient's  respiratory symptoms and status if we continue to expose her lungs to a known irritant.  We do not advise e-cigarettes as a form of stopping smoking.  Patient is willing to quit smoking.  Needs to set a quit date.  Currently using nicotine replacement therapies and Wellbutrin.  I have advised the patient that we can assist and have options of nicotine replacement therapy, provided smoking cessation education today, provided  smoking cessation counseling, and provided cessation resources.  Follow-up next office visit office visit for assessment of smoking cessation.    Smoking cessation counseling advised for: 4 min     Outpatient Encounter Medications as of 05/14/2019  Medication Sig  . albuterol (PROVENTIL) (2.5 MG/3ML) 0.083% nebulizer solution Take 3 mLs (2.5 mg total) by nebulization every 2 (two) hours as needed for wheezing. Dx: copd exacerbation J44.1  . albuterol (VENTOLIN HFA) 108 (90 Base) MCG/ACT inhaler Inhale 2 puffs into the lungs every 4 (four) hours as needed for wheezing or shortness of breath.  Marland Kitchen buPROPion (WELLBUTRIN SR) 150 MG 12 hr tablet Take 1 tablet (150 mg total) by mouth 2 (two) times daily.  . fluticasone (FLONASE) 50 MCG/ACT nasal spray Place 2 sprays into both nostrils daily. Use for 4-6 weeks then stop and use seasonally or as needed.  Marland Kitchen levothyroxine (SYNTHROID) 88 MCG tablet TAKE 1 TABLET BY MOUTH EVERY DAY ON AN EMPTY STOMACH  . meloxicam (MOBIC) 15 MG tablet TAKE 1 TABLET(15 MG) BY MOUTH DAILY  . Multiple Vitamin (MULTIVITAMIN WITH MINERALS) TABS tablet Take 1 tablet by mouth daily. One-A-Day Multivitamin  . nicotine (NICODERM CQ) 21 mg/24hr patch Place 1 patch (21 mg total) onto the skin daily.  . nicotine polacrilex (NICORETTE) 4 MG gum Take 1 each (4 mg total) by mouth as needed for smoking cessation.  Marland Kitchen omeprazole (PRILOSEC) 20 MG capsule Take 1 capsule (20 mg total) by mouth daily before breakfast.  . sertraline (ZOLOFT) 50 MG tablet Take 1 tablet (50 mg total) by mouth daily.  Marland Kitchen umeclidinium-vilanterol (ANORO ELLIPTA) 62.5-25 MCG/INH AEPB Inhale 1 puff into the lungs daily.  . vitamin B-12 (CYANOCOBALAMIN) 500 MCG tablet Take 500 mcg by mouth 2 (two) times daily.  . vitamin E (VITAMIN E) 180 MG (400 UNITS) capsule Take 400 Units by mouth daily.  Marland Kitchen zinc sulfate 220 (50 Zn) MG capsule Take 220 mg by mouth daily.   No facility-administered encounter medications on file as of  05/14/2019.     Review of Systems  Review of Systems  Constitutional: Positive for fatigue. Negative for activity change, chills, fever and unexpected weight change.  HENT: Negative for postnasal drip, rhinorrhea, sinus pressure, sinus pain and sore throat.   Eyes: Negative.   Respiratory: Positive for shortness of breath. Negative for cough and wheezing.   Cardiovascular: Negative for chest pain and  palpitations.  Gastrointestinal: Negative for constipation, diarrhea, nausea and vomiting.  Endocrine: Negative.   Genitourinary: Negative.   Musculoskeletal: Positive for myalgias.  Skin: Negative.   Neurological: Negative for dizziness and headaches.  Psychiatric/Behavioral: Negative.  Negative for dysphoric mood. The patient is not nervous/anxious.   All other systems reviewed and are negative.    Physical Exam  BP 118/82   Pulse 88   Temp 97.9 F (36.6 C) (Temporal)   Ht 6\' 1"  (1.854 m)   Wt 195 lb (88.5 kg)   SpO2 98% Comment: on RA  BMI 25.73 kg/m   Wt Readings from Last 5 Encounters:  05/14/19 195 lb (88.5 kg)  05/03/19 196 lb 3.2 oz (89 kg)  04/23/19 196 lb (88.9 kg)  04/20/19 193 lb 6.4 oz (87.7 kg)  04/09/19 191 lb (86.6 kg)    BMI Readings from Last 5 Encounters:  05/14/19 25.73 kg/m  05/03/19 25.89 kg/m  04/23/19 25.86 kg/m  04/20/19 25.52 kg/m  04/09/19 25.20 kg/m     Physical Exam Vitals and nursing note reviewed.  Constitutional:      General: He is not in acute distress.    Appearance: Normal appearance. He is normal weight.  HENT:     Head: Normocephalic and atraumatic.     Right Ear: Hearing, tympanic membrane, ear canal and external ear normal. There is no impacted cerumen.     Left Ear: Hearing, tympanic membrane, ear canal and external ear normal. There is no impacted cerumen.     Nose: No mucosal edema.     Right Turbinates: Not enlarged.     Left Turbinates: Not enlarged.  Eyes:     Pupils: Pupils are equal, round, and reactive to  light.  Cardiovascular:     Rate and Rhythm: Normal rate and regular rhythm.     Pulses: Normal pulses.     Heart sounds: Normal heart sounds. No murmur.  Pulmonary:     Effort: Pulmonary effort is normal.     Breath sounds: Normal breath sounds. No decreased breath sounds, wheezing or rales.  Musculoskeletal:     Cervical back: Normal range of motion.     Right lower leg: No edema.     Left lower leg: No edema.  Lymphadenopathy:     Cervical: No cervical adenopathy.  Skin:    General: Skin is warm and dry.     Capillary Refill: Capillary refill takes less than 2 seconds.     Findings: No erythema or rash.  Neurological:     General: No focal deficit present.     Mental Status: He is alert and oriented to person, place, and time.     Motor: No weakness.     Coordination: Coordination normal.     Gait: Gait is intact. Gait normal.  Psychiatric:        Mood and Affect: Mood normal.        Behavior: Behavior normal. Behavior is cooperative.        Thought Content: Thought content normal.        Judgment: Judgment normal.       Assessment & Plan:   Centrilobular emphysema (HCC) 102-pack-year smoking history Current smoker Working to decrease smoking Adherent to [employer] Status post Covid in February/2021 COPD gold 2 off of pulmonary function testing today when you factor in the fact the patient took Anoro Ellipta this morning  Plan: Proceed forward with pulmonary rehab referral Continue Anoro Ellipta We will order high-resolution CT chest  to further evaluate interstitial coarsening seen on x-ray Emphasized importance to stop smoking We will establish care with new pulmonologist in our practice  Dysphagia, pharyngeal phase History of aspiration pneumonia Has been evaluated by gastroenterology and encouraged to utilize chin tuck maneuvers  Plan: We will continue to clinically monitor Continue chin tuck maneuvers   History of COVID-19 Reviewed pulmonary  function testing today Most recent x-ray shows interstitial coarsening No recent CT imaging Patient continues to have symptoms of long Covid such as fatigue, brain fog, muscle weakness, myalgias  Discussion: Explained to patient and could consider ordering lab work such as CK-MB, aldolase, evaluate for myositis.  Most likely patient's symptoms are pertaining to symptoms of long Covid.  Patient would like to hold off on lab work at this time and try to increase his overall physical activity and see if symptoms improve.  If patients symptoms persist he will contact our office back and we can consider ordering lab work.  Plan: High-resolution CT chest ordered 48-month follow-up with our office Proceed forward with pulmonary rehab referral  Tobacco abuse Current smoker Smoking 6 to 7 cigarettes a day Using nicotine replacement therapies and Wellbutrin 102-pack-year smoking history Has not set a quit date December/2020 read as lung RADS 1S  Plan: Emphasized again the need to stop smoking Set quit date High-resolution CT chest ordered    Return in about 2 months (around 07/14/2019), or if symptoms worsen or fail to improve, for [facility] - Dr. Patsey Berthold, [facility] - Dr. Mortimer Fries, After Chest CT.   Lauraine Rinne, NP 05/14/2019   This appointment required 32 minutes of patient care (this includes precharting, chart review, review of results, face-to-face care, etc.).

## 2019-05-14 NOTE — Assessment & Plan Note (Signed)
Current smoker Smoking 6 to 7 cigarettes a day Using nicotine replacement therapies and Wellbutrin 102-pack-year smoking history Has not set a quit date December/2020 read as lung RADS 1S  Plan: Emphasized again the need to stop smoking Set quit date High-resolution CT chest ordered

## 2019-05-14 NOTE — Progress Notes (Signed)
Full PFT performed today. °

## 2019-05-14 NOTE — Assessment & Plan Note (Addendum)
Reviewed pulmonary function testing today Most recent x-ray shows interstitial coarsening No recent CT imaging Patient continues to have symptoms of long Covid such as fatigue, brain fog, muscle weakness, myalgias  Discussion: Explained to patient and could consider ordering lab work such as CK-MB, aldolase, evaluate for myositis.  Most likely patient's symptoms are pertaining to symptoms of long Covid.  Patient would like to hold off on lab work at this time and try to increase his overall physical activity and see if symptoms improve.  If patients symptoms persist he will contact our office back and we can consider ordering lab work.  Plan: High-resolution CT chest ordered 89-month follow-up with our office Proceed forward with pulmonary rehab referral

## 2019-05-14 NOTE — Assessment & Plan Note (Signed)
History of aspiration pneumonia Has been evaluated by gastroenterology and encouraged to utilize chin tuck maneuvers  Plan: We will continue to clinically monitor Continue chin tuck maneuvers

## 2019-05-14 NOTE — Progress Notes (Signed)
Discussed results with patient in office.  Nothing further is needed at this time.  Claris Pech FNP  

## 2019-05-14 NOTE — Patient Instructions (Addendum)
You were seen today by Lauraine Rinne, NP  for:   1. Shortness of breath  - CT Chest High Resolution; Future  2. History of COVID-19  - CT Chest High Resolution; Future  Continue to improve your overall physical activity  Hopefully this will help with the myalgias that you have been having since Covid  We can also get you set up for pulmonary rehab if you are interested in attending [facility]  3. Centrilobular emphysema (HCC)  Anoro Ellipta  >>> Take 1 puff daily in the morning right when you wake up >>>Rinse your mouth out after use >>>This is a daily maintenance inhaler, NOT a rescue inhaler >>>Contact our office if you are having difficulties affording or obtaining this medication >>>It is important for you to be able to take this daily and not miss any doses   Note your daily symptoms > remember "red flags" for COPD:   >>>Increase in cough >>>increase in sputum production >>>increase in shortness of breath or activity  intolerance.   If you notice these symptoms, please call the office to be seen.     We recommend today:  Orders Placed This Encounter  Procedures  . CT Chest High Resolution    -High-res CT with supine and prone positioning -inspiratory and expiratory cuts.  -Only to be read by Dr. Rosario Jacks and Dr. Weber Cooks.    Standing Status:   Future    Standing Expiration Date:   07/13/2020    Scheduling Instructions:     Complete by 06/26/19    Order Specific Question:   Preferred imaging location?    Answer:   [facility]    Order Specific Question:   Radiology Contrast Protocol - do NOT remove file path    Answer:   [email or web address]   Orders Placed This Encounter  Procedures  . CT Chest High Resolution   No orders of the defined types were placed in this encounter.   Follow Up:    Return in about 2 months (around 07/14/2019), or if symptoms worsen or fail to improve, for [facility] - Dr. Patsey Berthold, [facility] - Dr. Mortimer Fries, After Chest CT.   Please do your part to reduce the spread of COVID-19:      Reduce your risk of any infection  and COVID19 by using the similar precautions used for avoiding the common cold or flu:  Marland Kitchen Wash your hands often with soap and warm water for at least 20 seconds.  If soap and water are not readily available, use an alcohol-based hand sanitizer with at least 60% alcohol.  . If coughing or sneezing, cover your mouth and nose by coughing or sneezing into the elbow areas of your shirt or coat, into a tissue or into your sleeve (not your hands). Langley Gauss A MASK when in public  . Avoid shaking hands with others and consider head nods or verbal greetings only. . Avoid touching your eyes, nose, or mouth with unwashed hands.  . Avoid close contact with people who are sick. . Avoid places or events with large numbers of people in one location, like concerts or sporting events. . If you have some symptoms but not all symptoms, continue to monitor at home and seek medical attention if your symptoms worsen. . If you are having a medical emergency, call 911.   [place] / e-Visit: [email or web address]         [facility]: [phone]  [facility]: [id number]                   [facility]: [id number]     It is flu season:   >>> Best ways to protect herself from the flu: Receive the yearly flu vaccine, practice good hand hygiene washing with soap and also using hand sanitizer when available, eat a nutritious meals, get adequate rest, hydrate appropriately   Please contact the office if your symptoms worsen or you have concerns that you are not improving.   Thank you for choosing [facility] for your healthcare, and for allowing [place] to partner with you on your healthcare journey. I am thankful to be able to provide  care to you today.   Wyn Quaker FNP-C

## 2019-05-15 DIAGNOSIS — Z0100 Encounter for examination of eyes and vision without abnormal findings: Secondary | ICD-10-CM | POA: Diagnosis not present

## 2019-05-22 ENCOUNTER — Encounter: Payer: Self-pay | Admitting: Family Medicine

## 2019-05-22 ENCOUNTER — Other Ambulatory Visit: Payer: Self-pay

## 2019-05-22 ENCOUNTER — Telehealth (INDEPENDENT_AMBULATORY_CARE_PROVIDER_SITE_OTHER): Payer: BC Managed Care – PPO | Admitting: Family Medicine

## 2019-05-22 DIAGNOSIS — G933 Postviral fatigue syndrome: Secondary | ICD-10-CM

## 2019-05-22 DIAGNOSIS — J432 Centrilobular emphysema: Secondary | ICD-10-CM | POA: Diagnosis not present

## 2019-05-22 DIAGNOSIS — R0609 Other forms of dyspnea: Secondary | ICD-10-CM

## 2019-05-22 DIAGNOSIS — G9331 Postviral fatigue syndrome: Secondary | ICD-10-CM

## 2019-05-22 DIAGNOSIS — Z8616 Personal history of COVID-19: Secondary | ICD-10-CM

## 2019-05-22 NOTE — Patient Instructions (Addendum)
Letter written, work restrictions for 05/24/19 return, 15 min break every 1 hour, for 4 weeks.  Let me know what your HR says about what happens if you are not successful in returning to work if it affects your disability.  I will not send fax letter until I hear from you.  Please schedule a Follow-up Appointment to: Return if symptoms worsen or fail to improve, for 4-6 weeks as needed post viral fatigue syndrome work restrictions.  If you have any other questions or concerns, please feel free to call the office or send a message through [place]. You may also schedule an earlier appointment if necessary.  Additionally, you may be receiving a survey about your experience at our office within a few days to 1 week by e-mail or mail. We value your feedback.  Nobie Putnam, DO [place]

## 2019-05-22 NOTE — Progress Notes (Addendum)
Virtual Visit via Telephone The purpose of this virtual visit is to provide medical care while limiting exposure to the novel coronavirus (COVID19) for both patient and office staff.  Consent was obtained for phone visit:  Yes.   Answered questions that patient had about telehealth interaction:  Yes.   I discussed the limitations, risks, security and privacy concerns of performing an evaluation and management service by telephone. I also discussed with the patient that there may be a patient responsible charge related to this service. The patient expressed understanding and agreed to proceed.  Patient Location: Home Provider Location: [facility])  ---------------------------------------------------------------------- Chief Complaint  Patient presents with  . Postviral fatigue syndrome    wants to go back to work with some restriction    S: Reviewed CMA documentation. I have called patient and gathered additional HPI as follows:  FATIGUE / POST VIRAL SYNDROME / History of COVID19 DYSPNEA ON EXERTION COPD See prior note for background information. Last telemedicine visit 05/10/19 with me, he was continued on extension of short term disability from work for up to another 3 months if needed. He saw [place] [facility] on 05/14/19. He was advised to quit smoking and pursue CT High resolution scan and future testing for oxygen. Now about 2 weeks later, some improvement. He feels not as winded or dyspnea with exertion and walking. Associated with fatigue and tired. He continues on vitamin supplements He continues on Anoro, Albuterol PRN, nebulizer PRN  He is interested to return to work soon with some restrictions. He does not feel like he can return to full duty at this time. He does still need to take frequent breaks to catch breath and rest.   Denies any fevers, chills, sweats, body ache, sinus pain or pressure, headache, abdominal pain, diarrhea  Past Medical  History:  Diagnosis Date  . Anxiety   . Arthritis   . Carcinoma metastatic to lymph node ([place])   . Cataract    right  . COPD (chronic obstructive pulmonary disease) ([place])   . Depression   . GERD (gastroesophageal reflux disease)   . Hoarseness of voice   . Personal history of tobacco use, presenting hazards to health February of 2001  . Thyroid disease    Social History   Tobacco Use  . Smoking status: Current Some Day Smoker    Packs/day: 2.00    Years: 51.00    Pack years: 102.00    Types: Cigarettes    Start date: 33  . Smokeless tobacco: Current User  . Tobacco comment: 05/03/19- down to half a pack or less / day  Substance Use Topics  . Alcohol use: No  . Drug use: No    Current Outpatient Medications:  .  albuterol (PROVENTIL) (2.5 MG/3ML) 0.083% nebulizer solution, Take 3 mLs (2.5 mg total) by nebulization every 2 (two) hours as needed for wheezing. Dx: copd exacerbation J44.1, Disp: 120 mL, Rfl: 10 .  albuterol (VENTOLIN HFA) 108 (90 Base) MCG/ACT inhaler, Inhale 2 puffs into the lungs every 4 (four) hours as needed for wheezing or shortness of breath., Disp: 1 g, Rfl: 10 .  buPROPion (WELLBUTRIN SR) 150 MG 12 hr tablet, Take 1 tablet (150 mg total) by mouth 2 (two) times daily., Disp: 60 tablet, Rfl: 1 .  fluticasone (FLONASE) 50 MCG/ACT nasal spray, Place 2 sprays into both nostrils daily. Use for 4-6 weeks then stop and use seasonally or as needed., Disp: 16 g, Rfl: 3 .  levothyroxine (SYNTHROID)  88 MCG tablet, TAKE 1 TABLET BY MOUTH EVERY DAY ON AN EMPTY STOMACH, Disp: 90 tablet, Rfl: 1 .  meloxicam (MOBIC) 15 MG tablet, TAKE 1 TABLET(15 MG) BY MOUTH DAILY, Disp: 30 tablet, Rfl: 2 .  Multiple Vitamin (MULTIVITAMIN WITH MINERALS) TABS tablet, Take 1 tablet by mouth daily. One-A-Day Multivitamin, Disp: , Rfl:  .  nicotine (NICODERM CQ) 21 mg/24hr patch, Place 1 patch (21 mg total) onto the skin daily., Disp: 28 patch, Rfl: 1 .  nicotine polacrilex (NICORETTE) 4 MG  gum, Take 1 each (4 mg total) by mouth as needed for smoking cessation., Disp: 100 tablet, Rfl: 3 .  omeprazole (PRILOSEC) 20 MG capsule, Take 1 capsule (20 mg total) by mouth daily before breakfast., Disp: 90 capsule, Rfl: 1 .  sertraline (ZOLOFT) 50 MG tablet, Take 1 tablet (50 mg total) by mouth daily., Disp: 30 tablet, Rfl: 3 .  umeclidinium-vilanterol (ANORO ELLIPTA) 62.5-25 MCG/INH AEPB, Inhale 1 puff into the lungs daily., Disp: 60 each, Rfl: 10 .  vitamin B-12 (CYANOCOBALAMIN) 500 MCG tablet, Take 500 mcg by mouth 2 (two) times daily., Disp: , Rfl:  .  vitamin E (VITAMIN E) 180 MG (400 UNITS) capsule, Take 400 Units by mouth daily., Disp: , Rfl:  .  zinc sulfate 220 (50 Zn) MG capsule, Take 220 mg by mouth daily., Disp: , Rfl:   Depression screen [facility] 2/9 05/10/2019 04/09/2019 03/28/2019  Decreased Interest 3 3 0  Down, Depressed, Hopeless 1 2 0  PHQ - 2 Score 4 5 0  Altered sleeping 2 1 -  Tired, decreased energy 3 3 -  Change in appetite 0 1 -  Feeling bad or failure about yourself  3 3 -  Trouble concentrating 3 2 -  Moving slowly or fidgety/restless 0 2 -  Suicidal thoughts 1 2 -  PHQ-9 Score 16 19 -  Difficult doing work/chores Somewhat difficult Extremely dIfficult -    GAD 7 : Generalized Anxiety Score 05/10/2019 04/09/2019  Nervous, Anxious, on Edge 1 2  Control/stop worrying 3 3  Worry too much - different things 3 2  Trouble relaxing 1 2  Restless 0 1  Easily annoyed or irritable 3 1  Afraid - awful might happen 3 2  Total GAD 7 Score 14 13  Anxiety Difficulty Somewhat difficult Extremely difficult    -------------------------------------------------------------------------- O: No physical exam performed due to remote telephone encounter.  Lab results reviewed.  Recent Results (from the past 2160 hour(s))  Novel Coronavirus, NAA (Hosp order, Send-out to Ref Lab; TAT 18-24 hrs     Status: Abnormal   Collection Time: 02/26/19  4:46 PM   Specimen: Nasopharyngeal  Swab; Respiratory  Result Value Ref Range   SARS-CoV-2, NAA DETECTED (A) NOT DETECTED    Comment: RESULT CALLED TO, READ BACK BY AND VERIFIED WITH: NOTIFIED LINDSEY WESTBROOKS 02/27/2019 1556 (NOTE)                  Client Requested Flag This nucleic acid amplification test was developed and its performance characteristics determined by [employer]. Nucleic acid amplification tests include RT-PCR and TMA. This test has not been FDA cleared or approved. This test has been authorized by FDA under an Emergency Use Authorization (EUA). This test is only authorized for the duration of time the declaration that circumstances exist justifying the authorization of the emergency use of in vitro diagnostic tests for detection of SARS-CoV-2 virus and/or diagnosis of COVID-19 infection under section 564(b)(1) of the Act, 21  U.S.C. [id number]) (1), unless the authorization is terminated or revoked sooner. When diagnostic testing is negative, the possibility of a false negative result should be considered in the context of a patient's recent exposures and the presence o f clinical signs and symptoms consistent with COVID-19. An individual without symptoms of COVID- 19 and who is not shedding SARS-CoV-2 virus would expect to have a negative (not detected) result in this assay. Performed At: [facility] [place], [place] [id number] Rush Farmer MD [id number] Performed at [facility], [address] [place], [place] [address]    Coronavirus Source NASOPHARYNGEAL     Comment: Performed at [facility], [address], [place], [place] [address]  Basic metabolic panel     Status: Abnormal   Collection Time: 03/21/19 12:38 PM  Result Value Ref Range   Sodium 140 135 - 145 mmol/L   Potassium 4.2 3.5 - 5.1 mmol/L   Chloride 103 98 - 111 mmol/L   CO2 27 22 - 32 mmol/L   Glucose, Bld 97 70 - 99 mg/dL    Comment: Glucose reference range  applies only to samples taken after fasting for at least 8 hours.   BUN 29 (H) 8 - 23 mg/dL   Creatinine, Ser 1.30 (H) 0.61 - 1.24 mg/dL   Calcium 9.6 8.9 - 10.3 mg/dL   GFR calc non Af Amer 57 (L) >60 mL/min   GFR calc Af Amer >60 >60 mL/min   Anion gap 10 5 - 15    Comment: Performed at [facility], [address] [place], [place] [address]  CBC     Status: Abnormal   Collection Time: 03/21/19 12:38 PM  Result Value Ref Range   WBC 11.6 (H) 4.0 - 10.5 K/uL   RBC 4.93 4.22 - 5.81 MIL/uL   Hemoglobin 14.7 13.0 - 17.0 g/dL   HCT 44.7 39.0 - 52.0 %   MCV 90.7 80.0 - 100.0 fL   MCH 29.8 26.0 - 34.0 pg   MCHC 32.9 30.0 - 36.0 g/dL   RDW 13.0 11.5 - 15.5 %   Platelets 310 150 - 400 K/uL   nRBC 0.0 0.0 - 0.2 %    Comment: Performed at [facility], [address] [place], [place] [address]  Urinalysis, Complete w Microscopic     Status: Abnormal   Collection Time: 03/21/19  3:13 PM  Result Value Ref Range   Color, Urine YELLOW (A) YELLOW   APPearance HAZY (A) CLEAR   Specific Gravity, Urine 1.014 1.005 - 1.030   pH 6.0 5.0 - 8.0   Glucose, UA NEGATIVE NEGATIVE mg/dL   Hgb urine dipstick NEGATIVE NEGATIVE   Bilirubin Urine NEGATIVE NEGATIVE   Ketones, ur NEGATIVE NEGATIVE mg/dL   Protein, ur NEGATIVE NEGATIVE mg/dL   Nitrite NEGATIVE NEGATIVE   Leukocytes,Ua NEGATIVE NEGATIVE   RBC / HPF 0-5 0 - 5 RBC/hpf   WBC, UA 0-5 0 - 5 WBC/hpf   Bacteria, UA NONE SEEN NONE SEEN   Squamous Epithelial / LPF NONE SEEN 0 - 5   Hyaline Casts, UA PRESENT     Comment: Performed at [facility], [address], [place],  [address]  NM Myocar Multi W/Spect W/Wall Motion / EF     Status: None   Collection Time: 04/30/19 10:04 AM  Result Value Ref Range   Rest HR 66 bpm   Rest BP 120/92 mmHg   Percent HR 55 %   Peak  HR 85 bpm   Peak BP 113/72 mmHg   SSS 0    SRS 2    SDS 0    TID 0.92    LV sys vol 35 mL   LV dias vol 104 62 - 150 mL  SARS  CORONAVIRUS 2 (TAT 6-24 HRS) Nasopharyngeal Nasopharyngeal Swab     Status: None   Collection Time: 05/11/19  9:59 AM   Specimen: Nasopharyngeal Swab  Result Value Ref Range   SARS Coronavirus 2 NEGATIVE NEGATIVE    Comment: (NOTE) SARS-CoV-2 target nucleic acids are NOT DETECTED. The SARS-CoV-2 RNA is generally detectable in upper and lower respiratory specimens during the acute phase of infection. Negative results do not preclude SARS-CoV-2 infection, do not rule out co-infections with other pathogens, and should not be used as the sole basis for treatment or other patient management decisions. Negative results must be combined with clinical observations, patient history, and epidemiological information. The expected result is Negative. Fact Sheet for Patients: SugarRoll.be Fact Sheet for Healthcare Providers: https://www.woods-mathews.com/ This test is not yet approved or cleared by the [place] FDA and  has been authorized for detection and/or diagnosis of SARS-CoV-2 by FDA under an Emergency Use Authorization (EUA). This EUA will remain  in effect (meaning this test can be used) for the duration of the COVID-19 declaration under Section 56 4(b)(1) of the Act, 21 U.S.C. section 360bbb-3(b)(1), unless the authorization is terminated or revoked sooner. Performed at [facility], [place] [address] [place], [place] [address]   Pulmonary function test     Status: None   Collection Time: 05/14/19  9:36 AM  Result Value Ref Range   FVC-Pre 3.73 L   FVC-%Pred-Pre 73 %   FVC-Post 4.06 L   FVC-%Pred-Post 79 %   FVC-%Change-Post 8 %   FEV1-Pre 2.76 L   FEV1-%Pred-Pre 72 %   FEV1-Post 3.00 L   FEV1-%Pred-Post 79 %   FEV1-%Change-Post 8 %   FEV6-Pre 3.71 L   FEV6-%Pred-Pre 76 %   FEV6-Post 4.02 L   FEV6-%Pred-Post 83 %   FEV6-%Change-Post 8 %   Pre FEV1/FVC ratio 74 %   FEV1FVC-%Pred-Pre 99 %   Post FEV1/FVC ratio 74 %    FEV1FVC-%Change-Post 0 %   Pre FEV6/FVC Ratio 100 %   FEV6FVC-%Pred-Pre 104 %   Post FEV6/FVC ratio 99 %   FEV6FVC-%Pred-Post 104 %   FEV6FVC-%Change-Post 0 %   FEF 25-75 Pre 2.12 L/sec   FEF2575-%Pred-Pre 71 %   FEF 25-75 Post 2.92 L/sec   FEF2575-%Pred-Post 98 %   FEF2575-%Change-Post 37 %   RV 3.15 L   RV % pred 124 %   TLC 6.99 L   TLC % pred 91 %   DLCO unc 28.03 ml/min/mmHg   DLCO unc % pred 96 %   DLCO cor 27.95 ml/min/mmHg   DLCO cor % pred 96 %   DL/VA 4.52 ml/min/mmHg/L   DL/VA % pred 111 %    -------------------------------------------------------------------------- A&P:  Problem List Items Addressed This Visit    History of COVID-19   Centrilobular emphysema (HCC)    Other Visit Diagnoses    Postviral fatigue syndrome    -  Primary   Dyspnea on exertion         #Post viral fatigue Syndrome / History of COVID / COPD / dyspnea on exertion  Now recent improvement in dyspnea, breathing / fatigue seems gradual improved Not ready to return to work full duty without restrictions yet however  Clinically still persistent complication post covid now about 3+ month  Concern that he has severe COPD and complex respiratory history with known difficulties with dysphagia in past has caused complicated course, and he is high risk of complications from 0000000  He has been hospitalized and well treated for COVID acutely already in the past.  Interval progress with Cardiology evaluation has not shown significant cardiovascular disease at this time.  He continues to work with [employer] and has upcoming walking oxygen test and high resolution CT scan scheduled.  No further acute changes today. He should continue on current therapy and follow up Pulmonology  Given some improvement, new note written today to return to work on 05/24/19 with work restrictions.  I would recommend that he may return to work a full shift without reduced hours, but he should be allowed to  take frequent breaks to rest for his breathing. He may take up to a 15 minute break, after every 1 hour of working. I would recommend that these work restrictions remain in place for up to 4 weeks. He will be re-evaluated prior to removing these work restrictions if he continues to improve. If he is unable to work after this attempt, then he will need longer time out.  Letter written for his employer HR with this information. However he will call HR first to clarify situation and future plan as he may need to transition to long term disability LTD soon and he needs to clarify first before attempting to return with restrictions.  Patient called back and requested letter to be faxed to his employee HR today 05/22/19.  No orders of the defined types were placed in this encounter.   Follow-up: - Return in 4-6 weeks  Patient verbalizes understanding with the above medical recommendations including the limitation of remote medical advice.  Specific follow-up and call-back criteria were given for patient to follow-up or seek medical care more urgently if needed.   - Time spent in direct consultation with patient on phone: 12 minutes   Nobie Putnam, [facility] 05/22/2019, 9:29 AM

## 2019-05-23 ENCOUNTER — Ambulatory Visit (INDEPENDENT_AMBULATORY_CARE_PROVIDER_SITE_OTHER): Payer: BC Managed Care – PPO | Admitting: Internal Medicine

## 2019-05-23 DIAGNOSIS — R0602 Shortness of breath: Secondary | ICD-10-CM | POA: Diagnosis not present

## 2019-05-23 DIAGNOSIS — J449 Chronic obstructive pulmonary disease, unspecified: Secondary | ICD-10-CM

## 2019-05-23 NOTE — Progress Notes (Signed)
Six Minute Walk - 05/23/19 0912      Six Minute Walk   Medications taken before test (dose and time)  wellbutrin, flonase, synthroid, prilosec, zoloft, anoro, zinc all taken at 6 am    Supplemental oxygen during test?  No    Lap distance in meters   20 meters    Laps Completed   11    Partial lap (in meters)  3 meters    Baseline BP (sitting)  94/60    Baseline Heartrate  75    Baseline Dyspnea (Borg Scale)  3    Baseline Fatigue (Borg Scale)  7    Baseline SPO2  97 %      End of Test Values    BP (sitting)  122/80    Heartrate  99    Dyspnea (Borg Scale)  3    Fatigue (Borg Scale)  7    SPO2  93 %      2 Minutes Post Walk Values   BP (sitting)  110/70    Heartrate  96    SPO2  98 %    Stopped or paused before six minutes?  No    Other Symptoms at end of exercise:  Leg pain;Calf pain;Hip pain      Interpretation   Distance completed  223 meters

## 2019-05-29 DIAGNOSIS — J449 Chronic obstructive pulmonary disease, unspecified: Secondary | ICD-10-CM | POA: Diagnosis not present

## 2019-05-31 ENCOUNTER — Other Ambulatory Visit: Payer: Self-pay

## 2019-05-31 ENCOUNTER — Ambulatory Visit (INDEPENDENT_AMBULATORY_CARE_PROVIDER_SITE_OTHER): Payer: BC Managed Care – PPO

## 2019-05-31 DIAGNOSIS — R079 Chest pain, unspecified: Secondary | ICD-10-CM | POA: Diagnosis not present

## 2019-05-31 DIAGNOSIS — I739 Peripheral vascular disease, unspecified: Secondary | ICD-10-CM

## 2019-06-04 ENCOUNTER — Other Ambulatory Visit: Payer: Self-pay

## 2019-06-04 ENCOUNTER — Ambulatory Visit (INDEPENDENT_AMBULATORY_CARE_PROVIDER_SITE_OTHER): Payer: BC Managed Care – PPO | Admitting: Cardiology

## 2019-06-04 ENCOUNTER — Encounter: Payer: Self-pay | Admitting: Cardiology

## 2019-06-04 VITALS — BP 110/82 | HR 78 | Ht 73.0 in | Wt 192.0 lb

## 2019-06-04 DIAGNOSIS — F17200 Nicotine dependence, unspecified, uncomplicated: Secondary | ICD-10-CM | POA: Diagnosis not present

## 2019-06-04 DIAGNOSIS — I712 Thoracic aortic aneurysm, without rupture, unspecified: Secondary | ICD-10-CM

## 2019-06-04 DIAGNOSIS — I714 Abdominal aortic aneurysm, without rupture, unspecified: Secondary | ICD-10-CM

## 2019-06-04 DIAGNOSIS — I739 Peripheral vascular disease, unspecified: Secondary | ICD-10-CM | POA: Diagnosis not present

## 2019-06-04 DIAGNOSIS — L821 Other seborrheic keratosis: Secondary | ICD-10-CM | POA: Diagnosis not present

## 2019-06-04 DIAGNOSIS — IMO0001 Reserved for inherently not codable concepts without codable children: Secondary | ICD-10-CM

## 2019-06-04 DIAGNOSIS — R079 Chest pain, unspecified: Secondary | ICD-10-CM

## 2019-06-04 NOTE — Progress Notes (Signed)
Cardiology Office Note:    Date:  06/04/2019   ID:  INIYAN [name], DOB 03-15-1950, MRN [id number]  PCP:  Olin Hauser, DO  Cardiologist:  Kate Sable, MD  Electrophysiologist:  None   Referring MD: Nobie Putnam *   Chief Complaint  Patient presents with  . office visit    F/U after cardiac testing; Meds verbally reviewed with patient.    History of Present Illness:    Luke [name] is a 69 y.o. male with a hx of anxiety, COPD, current smoker x50+ years who presents for follow-up.  Patient was last seen due to fatigue and chest discomfort. Patient was diagnosed with COVID-19 on 02/26/2019 received IV infusion.  Has noticed fatigue since then which has not gotten better.  Also endorses chest pain.  Due to risk factors, echo and Myoview stress test was ordered.  Also had symptoms of claudication.  Peripheral arterial ultrasound ordered.    Past Medical History:  Diagnosis Date  . Anxiety   . Arthritis   . Carcinoma metastatic to lymph node ([place])   . Cataract    right  . COPD (chronic obstructive pulmonary disease) ([place])   . Depression   . GERD (gastroesophageal reflux disease)   . Hoarseness of voice   . Personal history of tobacco use, presenting hazards to health February of 2001  . Thyroid disease     Past Surgical History:  Procedure Laterality Date  . BALLOON DILATION N/A 01/30/2018   Procedure: BALLOON DILATION;  Surgeon: Mauri Pole, MD;  Location: [facility] ENDOSCOPY;  Service: Endoscopy;  Laterality: N/A;  . COLONOSCOPY    . COLONOSCOPY WITH PROPOFOL N/A 11/29/2016   Procedure: COLONOSCOPY WITH PROPOFOL;  Surgeon: Manya Silvas, MD;  Location: [facility] ENDOSCOPY;  Service: Endoscopy;  Laterality: N/A;  . COLONOSCOPY WITH PROPOFOL N/A 08/22/2017   Procedure: COLONOSCOPY WITH PROPOFOL;  Surgeon: Jonathon Bellows, MD;  Location: [facility] ENDOSCOPY;  Service: Gastroenterology;  Laterality: N/A;  . ESOPHAGEAL MANOMETRY N/A 10/05/2017   Procedure:  ESOPHAGEAL MANOMETRY (EM);  Surgeon: Jonathon Bellows, MD;  Location: [facility] ENDOSCOPY;  Service: Gastroenterology;  Laterality: N/A;  . ESOPHAGEAL MANOMETRY N/A 01/30/2018   Procedure: ESOPHAGEAL MANOMETRY (EM);  Surgeon: Mauri Pole, MD;  Location: [facility] ENDOSCOPY;  Service: Endoscopy;  Laterality: N/A;  . ESOPHAGOGASTRODUODENOSCOPY N/A 03/09/2018   Procedure: ESOPHAGOGASTRODUODENOSCOPY (EGD);  Surgeon: Milus Banister, MD;  Location: [facility];  Service: Endoscopy;  Laterality: N/A;  . ESOPHAGOGASTRODUODENOSCOPY (EGD) WITH PROPOFOL N/A 11/29/2016   Procedure: ESOPHAGOGASTRODUODENOSCOPY (EGD) WITH PROPOFOL;  Surgeon: Manya Silvas, MD;  Location: [facility] ENDOSCOPY;  Service: Endoscopy;  Laterality: N/A;  . ESOPHAGOGASTRODUODENOSCOPY (EGD) WITH PROPOFOL N/A 08/22/2017   Procedure: ESOPHAGOGASTRODUODENOSCOPY (EGD) WITH PROPOFOL;  Surgeon: Jonathon Bellows, MD;  Location: [facility] ENDOSCOPY;  Service: Gastroenterology;  Laterality: N/A;  . ESOPHAGOGASTRODUODENOSCOPY (EGD) WITH PROPOFOL N/A 01/30/2018   Procedure: ESOPHAGOGASTRODUODENOSCOPY (EGD) WITH PROPOFOL;  Surgeon: Mauri Pole, MD;  Location: [facility] ENDOSCOPY;  Service: Endoscopy;  Laterality: N/A;  Mano probe to be placed during EGD  . EUS N/A 03/09/2018   Procedure: UPPER ENDOSCOPIC ULTRASOUND (EUS) RADIAL;  Surgeon: Milus Banister, MD;  Location: [facility] ENDOSCOPY;  Service: Endoscopy;  Laterality: N/A;  . NECK SURGERY     status post diagnosis of cancer with chemo and radiation  . SUBMUCOSAL INJECTION  03/09/2018   Procedure: SUBMUCOSAL INJECTION;  Surgeon: Milus Banister, MD;  Location: [facility] ENDOS[facility];  Service: Endoscopy;;  . TONSILLECTOMY      Current Medications: Current  Meds  Medication Sig  . albuterol (PROVENTIL) (2.5 MG/3ML) 0.083% nebulizer solution Take 3 mLs (2.5 mg total) by nebulization every 2 (two) hours as needed for wheezing. Dx: copd exacerbation J44.1  . albuterol (VENTOLIN HFA) 108 (90 Base) MCG/ACT inhaler Inhale 2 puffs  into the lungs every 4 (four) hours as needed for wheezing or shortness of breath.  Marland Kitchen buPROPion (WELLBUTRIN SR) 150 MG 12 hr tablet Take 1 tablet (150 mg total) by mouth 2 (two) times daily.  . fluticasone (FLONASE) 50 MCG/ACT nasal spray Place 2 sprays into both nostrils daily. Use for 4-6 weeks then stop and use seasonally or as needed.  Marland Kitchen levothyroxine (SYNTHROID) 88 MCG tablet TAKE 1 TABLET BY MOUTH EVERY DAY ON AN EMPTY STOMACH  . meloxicam (MOBIC) 15 MG tablet TAKE 1 TABLET(15 MG) BY MOUTH DAILY  . Multiple Vitamin (MULTIVITAMIN WITH MINERALS) TABS tablet Take 1 tablet by mouth daily. One-A-Day Multivitamin  . nicotine (NICODERM CQ) 21 mg/24hr patch Place 1 patch (21 mg total) onto the skin daily.  . nicotine polacrilex (NICORETTE) 4 MG gum Take 1 each (4 mg total) by mouth as needed for smoking cessation.  Marland Kitchen omeprazole (PRILOSEC) 20 MG capsule Take 1 capsule (20 mg total) by mouth daily before breakfast.  . sertraline (ZOLOFT) 50 MG tablet Take 1 tablet (50 mg total) by mouth daily.  Marland Kitchen umeclidinium-vilanterol (ANORO ELLIPTA) 62.5-25 MCG/INH AEPB Inhale 1 puff into the lungs daily.  . vitamin B-12 (CYANOCOBALAMIN) 500 MCG tablet Take 500 mcg by mouth 2 (two) times daily.  Marland Kitchen VITAMIN D, ERGOCALCIFEROL, PO Take 1 tablet by mouth daily.  . vitamin E (VITAMIN E) 180 MG (400 UNITS) capsule Take 400 Units by mouth daily.  Marland Kitchen zinc sulfate 220 (50 Zn) MG capsule Take 220 mg by mouth daily.     Allergies:   Patient has no known allergies.   Social History   Socioeconomic History  . Marital status: Single    Spouse name: Not on file  . Number of children: 1  . Years of education: [employer]  . Highest education level: High school graduate  Occupational History  . Not on file  Tobacco Use  . Smoking status: Current Some Day Smoker    Packs/day: 2.00    Years: 51.00    Pack years: 102.00    Types: Cigarettes    Start date: 55  . Smokeless tobacco: Former Systems developer  . Tobacco comment:  05/03/19- down to half a pack or less / day  Substance and Sexual Activity  . Alcohol use: No  . Drug use: No  . Sexual activity: Not on file  Other Topics Concern  . Not on file  Social History Narrative  . Not on file   Social Determinants of Health   Financial Resource Strain:   . Difficulty of Paying Living Expenses:   Food Insecurity:   . Worried About Charity fundraiser in the Last Year:   . Arboriculturist in the Last Year:   Transportation Needs:   . Film/video editor (Medical):   Marland Kitchen Lack of Transportation (Non-Medical):   Physical Activity:   . Days of Exercise per Week:   . Minutes of Exercise per Session:   Stress:   . Feeling of Stress :   Social Connections:   . Frequency of Communication with Friends and Family:   . Frequency of Social Gatherings with Friends and Family:   . Attends Religious Services:   . Active  Member of Clubs or Organizations:   . Attends Archivist Meetings:   Marland Kitchen Marital Status:      Family History: The patient's family history includes Breast cancer in his mother; Cancer in his mother; Heart attack in his father; Heart disease in his father and mother.  ROS:   Please see the history of present illness.     All other systems reviewed and are negative.  EKGs/Labs/Other Studies Reviewed:    The following studies were reviewed today:   EKG:  EKG is  ordered today.  The ekg ordered today demonstrates normal sinus rhythm, normal ECG.  Recent Labs: 02/09/2019: ALT 13; TSH 3.00 03/21/2019: BUN 29; Creatinine, Ser 1.30; Hemoglobin 14.7; Platelets 310; Potassium 4.2; Sodium 140  Recent Lipid Panel    Component Value Date/Time   CHOL 186 02/09/2019 1003   TRIG 75 02/09/2019 1003   HDL 38 (L) 02/09/2019 1003   CHOLHDL 4.9 02/09/2019 1003   LDLCALC 131 (H) 02/09/2019 1003    Physical Exam:    VS:  BP 110/82 (BP Location: Left Arm, Patient Position: Sitting, Cuff Size: Normal)   Pulse 78   Ht 6\' 1"  (1.854 m)   Wt 192  lb (87.1 kg)   SpO2 95%   BMI 25.33 kg/m     Wt Readings from Last 3 Encounters:  06/04/19 192 lb (87.1 kg)  05/14/19 195 lb (88.5 kg)  05/03/19 196 lb 3.2 oz (89 kg)     GEN:  Well nourished, well developed in no acute distress HEENT: Normal NECK: No JVD; No carotid bruits LYMPHATICS: No lymphadenopathy CARDIAC: RRR, no murmurs, rubs, gallops RESPIRATORY: Decreased breath sounds, clear ABDOMEN: Soft, non-tender, non-distended MUSCULOSKELETAL:  No edema; No deformity  SKIN: Warm and dry NEUROLOGIC:  Alert and oriented x 3 PSYCHIATRIC:  Normal affect   ASSESSMENT:    1. Chest pain, unspecified type   2. Claudication ([place])   3. Smoking   4. Abdominal aortic aneurysm (AAA) without rupture ([place])   5. Thoracic aortic aneurysm without rupture ([place])    PLAN:    In order of problems listed above:  1. Patient with atypical symptoms of chest discomfort. He has risk factors for age and long smoking history.  Echocardiogram showed mild systolic function, impaired relaxation, EF 55 to 60%, mild dilatation of the ascending aorta measuring 4.2 cm.  Myoview stress test did not show any evidence for ischemia, low risk study. 2. He describes symptoms of claudication, has risk factors of long smoking history.  Arterial ultrasound did not reveal any occlusive disease in the peripheral arteries.  However, abdominal aorta measuring 4 cm and also aneurysm in the iliac arteries noted bilaterally.  Due to aneurysm in both the ascending aorta, abdominal aorta, iliac arteries, we will refer to vascular surgery for further input.  We will order CT angio aorta chest abdomen pelvis with runoff, to evaluate the entire aortic tree. 3. He is a current smoker x50 years. Currently working on smoking cessation. Encouraged to quit smoking. 4. For mild thoracic aortic aneurysm, will recommend yearly echocardiogram for sizing.  Keep appointment with vascular surgery as noted above due to aneurysms in the ascending  aorta, abdominal aorta, iliac arteries.  Smoking cessation is of paramount importance.  Total encounter time 45 minutes  Greater than 50% was spent in counseling and coordination of care with the patient Time spent educating patient on etiologies of aneurysms, smoking cessation recommendations, management options, monitoring of aneurysms.   Follow-up in 3 months.  This note was generated in part or whole with voice recognition software. Voice recognition is usually quite accurate but there are transcription errors that can and very often do occur. I apologize for any typographical errors that were not detected and corrected.   Medication Adjustments/Labs and Tests Ordered: Current medicines are reviewed at length with the patient today.  Concerns regarding medicines are outlined above.  Orders Placed This Encounter  Procedures  . CT ANGIO CHEST AORTA W/CM & OR WO/CM  . Basic Metabolic Panel (BMET)  . Ambulatory referral to Vascular Surgery  . EKG 12-Lead   No orders of the defined types were placed in this encounter.   Patient Instructions  Medication Instructions:  No Changes *If you need a refill on your cardiac medications before your next appointment, please call your pharmacy*   Lab Work: Your physician recommends that you have lab work today: BMP  If you have labs (blood work) drawn today and your tests are completely normal, you will receive your results only by: Marland Kitchen MyChart Message (if you have MyChart) OR . A paper copy in the mail If you have any lab test that is abnormal or we need to change your treatment, we will call you to review the results.   Testing/Procedures: Non-Cardiac CT Angiography (CTA) of the chest and abdominal aorta, is a special type of CT scan that uses a computer to produce multi-dimensional views of major blood vessels throughout the body. In CT angiography, a contrast material is injected through an IV to help visualize the blood  vessels.   Follow-Up: At [facility], you and your health needs are our priority.  As part of our continuing mission to provide you with exceptional heart care, we have created designated Provider Care Teams.  These Care Teams include your primary Cardiologist (physician) and Advanced Practice Providers (APPs -  Physician Assistants and Nurse Practitioners) who all work together to provide you with the care you need, when you need it.  We recommend signing up for the patient portal called "MyChart".  Sign up information is provided on this After Visit Summary.  MyChart is used to connect with patients for Virtual Visits (Telemedicine).  Patients are able to view lab/test results, encounter notes, upcoming appointments, etc.  Non-urgent messages can be sent to your provider as well.   To learn more about what you can do with MyChart, go to [email or web address].    Your next appointment:   3 month(s)  The format for your next appointment:   In Person  Provider:   Kate Sable, MD   Other Instructions N/A     Signed, Kate Sable, MD  06/04/2019 11:05 AM    [place]

## 2019-06-04 NOTE — Patient Instructions (Signed)
Medication Instructions:  No Changes *If you need a refill on your cardiac medications before your next appointment, please call your pharmacy*   Lab Work: Your physician recommends that you have lab work today: BMP  If you have labs (blood work) drawn today and your tests are completely normal, you will receive your results only by: Marland Kitchen MyChart Message (if you have MyChart) OR . A paper copy in the mail If you have any lab test that is abnormal or we need to change your treatment, we will call you to review the results.   Testing/Procedures: Non-Cardiac CT Angiography (CTA) of the chest and abdominal aorta, is a special type of CT scan that uses a computer to produce multi-dimensional views of major blood vessels throughout the body. In CT angiography, a contrast material is injected through an IV to help visualize the blood vessels.   Follow-Up: At [facility], you and your health needs are our priority.  As part of our continuing mission to provide you with exceptional heart care, we have created designated Provider Care Teams.  These Care Teams include your primary Cardiologist (physician) and Advanced Practice Providers (APPs -  Physician Assistants and Nurse Practitioners) who all work together to provide you with the care you need, when you need it.  We recommend signing up for the patient portal called "MyChart".  Sign up information is provided on this After Visit Summary.  MyChart is used to connect with patients for Virtual Visits (Telemedicine).  Patients are able to view lab/test results, encounter notes, upcoming appointments, etc.  Non-urgent messages can be sent to your provider as well.   To learn more about what you can do with MyChart, go to [email or web address].    Your next appointment:   3 month(s)  The format for your next appointment:   In Person  Provider:   Kate Sable, MD   Other Instructions N/A

## 2019-06-05 ENCOUNTER — Other Ambulatory Visit: Payer: Self-pay

## 2019-06-05 DIAGNOSIS — I712 Thoracic aortic aneurysm, without rupture, unspecified: Secondary | ICD-10-CM

## 2019-06-05 LAB — BASIC METABOLIC PANEL
BUN/Creatinine Ratio: 27 — ABNORMAL HIGH (ref 10–24)
BUN: 31 mg/dL — ABNORMAL HIGH (ref 8–27)
CO2: 24 mmol/L (ref 20–29)
Calcium: 9.7 mg/dL (ref 8.6–10.2)
Chloride: 105 mmol/L (ref 96–106)
Creatinine, Ser: 1.15 mg/dL (ref 0.76–1.27)
GFR calc Af Amer: 76 mL/min/{1.73_m2} (ref >59)
GFR calc non Af Amer: 66 mL/min/{1.73_m2} (ref >59)
Glucose: 107 mg/dL — ABNORMAL HIGH (ref 65–99)
Potassium: 4.5 mmol/L (ref 3.5–5.2)
Sodium: 143 mmol/L (ref 134–144)

## 2019-06-13 ENCOUNTER — Ambulatory Visit
Admission: RE | Admit: 2019-06-13 | Discharge: 2019-06-13 | Disposition: A | Discharge status: Home / Self Care | Payer: BC Managed Care – PPO | Source: Ambulatory Visit | Attending: Cardiology | Admitting: Cardiology

## 2019-06-13 ENCOUNTER — Other Ambulatory Visit: Payer: Self-pay

## 2019-06-13 DIAGNOSIS — I712 Thoracic aortic aneurysm, without rupture, unspecified: Secondary | ICD-10-CM

## 2019-06-13 DIAGNOSIS — I714 Abdominal aortic aneurysm, without rupture: Secondary | ICD-10-CM | POA: Diagnosis not present

## 2019-06-13 MED ORDER — IOHEXOL 350 MG/ML SOLN
150.0000 mL | Freq: Once | INTRAVENOUS | Status: AC | PRN
  Administered 2019-06-13: 150 mL via INTRAVENOUS

## 2019-06-19 ENCOUNTER — Telehealth: Payer: Self-pay | Admitting: Family Medicine

## 2019-06-19 NOTE — Telephone Encounter (Signed)
Received fax on 06/18/19 for an update for patient's Long Term Disability Claim.  He was last seen by me on 05/22/19. He was given clearance to return to work with restrictions to take more breaks and letter for work was written. Those restrictions were for 4 weeks, if he could not resume regular work he was asked to follow-up with me to discuss and then complete whatever new work restrictions or long term disability paperwork he needs.  He will need an appointment before I can complete his disability paperwork. I would recommend Face to Face visit. If he cannot come in then I would suggest MyChart Video Visit.  Nobie Putnam, [facility] 06/19/2019, 5:48 PM

## 2019-06-20 ENCOUNTER — Other Ambulatory Visit: Payer: Self-pay | Admitting: Family Medicine

## 2019-06-20 ENCOUNTER — Ambulatory Visit: Payer: BC Managed Care – PPO | Attending: Pulmonary Disease

## 2019-06-20 DIAGNOSIS — Z8579 Personal history of other malignant neoplasms of lymphoid, hematopoietic and related tissues: Secondary | ICD-10-CM

## 2019-06-20 NOTE — Telephone Encounter (Signed)
Patient notified he has apt for 06/25/2019 around 10:00 am for Face to Face.

## 2019-06-20 NOTE — Telephone Encounter (Signed)
Requested Prescriptions  Pending Prescriptions Disp Refills  . meloxicam (MOBIC) 15 MG tablet [Pharmacy Med Name: MELOXICAM 15MG  TABLETS] 30 tablet 0    Sig: TAKE 1 TABLET(15 MG) BY MOUTH DAILY     Analgesics:  COX2 Inhibitors Passed - 06/20/2019  3:41 PM      Passed - HGB in normal range and within 360 days    Hemoglobin  Date Value Ref Range Status  03/21/2019 14.7 13.0 - 17.0 g/dL Final   HGB  Date Value Ref Range Status  08/20/2013 14.6 13.0 - 18.0 g/dL Final         Passed - Cr in normal range and within 360 days    Creat  Date Value Ref Range Status  02/09/2019 1.05 0.70 - 1.25 mg/dL Final    Comment:    For patients >40 years of age, the reference limit for Creatinine is approximately 13% higher for people identified as African-American. .    Creatinine, Ser  Date Value Ref Range Status  06/04/2019 1.15 0.76 - 1.27 mg/dL Final         Passed - Patient is not pregnant      Passed - Valid encounter within last 12 months    Recent Outpatient Visits          4 weeks ago Postviral fatigue syndrome   Lafe, DO   1 month ago Centrilobular emphysema [facility])   [place], DO   2 months ago Centrilobular emphysema [facility])   Yaak, DO   2 months ago Postviral fatigue syndrome   [place], DO   3 months ago COVID-19 virus infection   [place], Devonne Doughty, DO      Future Appointments            In 5 days Parks Ranger, Devonne Doughty, [facility], [place]   In 2 months Kate Sable, MD [facility], [facility]   In 2 months [name], Barb Merino, DO [employer], [place]

## 2019-06-25 ENCOUNTER — Encounter: Payer: Self-pay | Admitting: Family Medicine

## 2019-06-25 ENCOUNTER — Ambulatory Visit (INDEPENDENT_AMBULATORY_CARE_PROVIDER_SITE_OTHER): Payer: BC Managed Care – PPO | Admitting: Family Medicine

## 2019-06-25 ENCOUNTER — Telehealth: Payer: Self-pay | Admitting: Internal Medicine

## 2019-06-25 ENCOUNTER — Other Ambulatory Visit: Payer: Self-pay

## 2019-06-25 VITALS — BP 85/63 | HR 92 | Temp 97.3°F | Resp 16 | Ht 73.0 in | Wt 192.6 lb

## 2019-06-25 DIAGNOSIS — G933 Postviral fatigue syndrome: Secondary | ICD-10-CM | POA: Diagnosis not present

## 2019-06-25 DIAGNOSIS — R29898 Other symptoms and signs involving the musculoskeletal system: Secondary | ICD-10-CM | POA: Diagnosis not present

## 2019-06-25 DIAGNOSIS — J432 Centrilobular emphysema: Secondary | ICD-10-CM

## 2019-06-25 DIAGNOSIS — G9331 Postviral fatigue syndrome: Secondary | ICD-10-CM | POA: Insufficient documentation

## 2019-06-25 DIAGNOSIS — Z8616 Personal history of COVID-19: Secondary | ICD-10-CM

## 2019-06-25 MED ORDER — IPRATROPIUM-ALBUTEROL 20-100 MCG/ACT IN AERS: 2.0000 | INHALATION_SPRAY | Freq: Four times a day (QID) | RESPIRATORY_TRACT | 5 refills | Status: DC

## 2019-06-25 NOTE — Telephone Encounter (Signed)
Noted, thank you

## 2019-06-25 NOTE — Telephone Encounter (Signed)
ATC Dr. Parks Ranger office to obtain additional information. I will placed on hold for >68min.  Will call back.

## 2019-06-25 NOTE — Telephone Encounter (Signed)
Spoke with Dr. Parks Ranger, who stated that pt is currently on short term disability. Dr. Parks Ranger is currently completing long term disability forms for pt. Forms will state 6-12 months undetermined for return to work date. Dr. Parks Ranger wanted to make Dr. Mortimer Fries aware of this information, as Dr. Mortimer Fries may receive these forms as well.   Routing to Dr. Mortimer Fries as an Juluis Rainier.

## 2019-06-25 NOTE — Progress Notes (Addendum)
Subjective:    Patient ID: Luke [name], male    DOB: 01-Jun-1950, 69 y.o.   MRN: [id number]  Luke [name] is a 69 y.o. male presenting on 06/25/2019 for Fatigue (paper work)   HPI    FATIGUE / POST COVID SYNDROME COMPLICATION DYSPNEA ON EXERTION COPD Lower Extremity Weakness / Myalgias See prior note for background information. Last telemedicine visit 05/22/19 - patient has continued on current respiratory COPD treatments with maintenance therapy inhalers and PRN inhaler use. He was given new letter or note to adjust his return to work plan with increased breaks and work restrictions.  He continues to follow-up with Cardiology and Pulmonology specialists. Last seen by Cardiology on 06/04/19, he had testing with [facility] Chest, and work up at that time. Ultimately no other problems identified cardiovascular.  He was last seen 05/14/19 by [facility] Pulmonology and had High res CT lungs, they were going to pursue Pulmonary Rehab to improve his breathing. He still has had difficulty and has  Oxygen testing as well. He has persistent severe COPD.  Today he returns, since he has tried to return to work with additional breaks and restrictions at work, however he was unable to adhere to work schedule with these accommodations. He was given part-time work 3 days a week and 8 hour shift with 15 min break every 1 hour, he could only work an hour or two before he had significant fatigue, weakness, leg pain, shortness of breath and required rest and time to recover. He was not able to keep working with these restrictions.  He admits he will feel these symptoms of fatigue, dyspnea, leg pain, short of breath worse with activity and walking, or any exertion, lifting, bending, reaching, standing. Even if he is not active he will gradually get fatigued from sedentary work.  He continues on vitamin supplements He continues on Anoro, Albuterol PRN, nebulizer PRN. He has tried Atrovent inhaler PRn as well some  improvement.   Denies any fevers, chills, sweats, body ache, sinus pain or pressure, headache, abdominal pain, diarrhea   Depression screen [facility] 2/9 05/10/2019 04/09/2019 03/28/2019  Decreased Interest 3 3 0  Down, Depressed, Hopeless 1 2 0  PHQ - 2 Score 4 5 0  Altered sleeping 2 1 -  Tired, decreased energy 3 3 -  Change in appetite 0 1 -  Feeling bad or failure about yourself  3 3 -  Trouble concentrating 3 2 -  Moving slowly or fidgety/restless 0 2 -  Suicidal thoughts 1 2 -  PHQ-9 Score 16 19 -  Difficult doing work/chores Somewhat difficult Extremely dIfficult -    Social History   Tobacco Use  . Smoking status: Current Some Day Smoker    Packs/day: 2.00    Years: 51.00    Pack years: 102.00    Types: Cigarettes    Start date: 29  . Smokeless tobacco: Former Systems developer  . Tobacco comment: 05/03/19- down to half a pack or less / day  Vaping Use  . Vaping Use: Never used  Substance Use Topics  . Alcohol use: No  . Drug use: No    Review of Systems Per HPI unless specifically indicated above     Objective:    BP (!) 85/63   Pulse 92   Temp (!) 97.3 F (36.3 C) (Temporal)   Resp 16   Ht 6\' 1"  (1.854 m)   Wt 192 lb 9.6 oz (87.4 kg)   SpO2 97%   BMI  25.41 kg/m   Wt Readings from Last 3 Encounters:  06/25/19 192 lb 9.6 oz (87.4 kg)  06/04/19 192 lb (87.1 kg)  05/14/19 195 lb (88.5 kg)    Physical Exam Vitals and nursing note reviewed.  Constitutional:      General: He is not in acute distress.    Appearance: He is well-developed. He is not diaphoretic.     Comments: Chronically ill appearing, comfortable, cooperative  HENT:     Head: Normocephalic and atraumatic.     Mouth/Throat:     Pharynx: No posterior oropharyngeal erythema.  Eyes:     General:        Right eye: No discharge.        Left eye: No discharge.     Conjunctiva/sclera: Conjunctivae normal.  Neck:     Thyroid: No thyromegaly.  Cardiovascular:     Rate and Rhythm: Normal rate and  regular rhythm.     Heart sounds: Normal heart sounds. No murmur heard.   Pulmonary:     Effort: Pulmonary effort is normal. No respiratory distress.     Breath sounds: No wheezing or rales.     Comments: Diffuse reduced air movement. Some audible upper airway noises Musculoskeletal:        General: Normal range of motion.     Cervical back: Normal range of motion and neck supple.     Right lower leg: No edema.     Left lower leg: No edema.  Lymphadenopathy:     Cervical: No cervical adenopathy.  Skin:    General: Skin is warm and dry.     Findings: No erythema or rash.  Neurological:     Mental Status: He is alert and oriented to person, place, and time.  Psychiatric:        Behavior: Behavior normal.     Comments: Well groomed, good eye contact, normal speech and thoughts    I have personally reviewed the radiology report from 06/13/19 on [facility].  Narrative & Impression  CLINICAL DATA:  Abdominal aortic aneurysm surveillance. Bilateral lower extremity claudication, pain x3 months. 5 pound weight loss  EXAM: CT ANGIOGRAPHY OF ABDOMINAL AORTA WITH ILIOFEMORAL RUNOFF  TECHNIQUE: Multidetector CT imaging of the abdomen, pelvis and lower extremities was performed using the standard protocol during bolus administration of intravenous contrast. Multiplanar CT image reconstructions and MIPs were obtained to evaluate the vascular anatomy.  CONTRAST:  128mL OMNIPAQUE IOHEXOL 350 MG/ML SOLN  COMPARISON:  02/19/2016 and previous  FINDINGS: VASCULAR  Aorta: Moderate partially calcified atheromatous plaque. Fusiform 4.4 x 4.2 cm infrarenal aneurysm, previously 3.5 cm, with a small amount of eccentric nonocclusive mural thrombus. No dissection or stenosis.  Celiac: Patent without evidence of aneurysm, dissection, vasculitis or significant stenosis.  SMA: Patent without evidence of aneurysm, dissection, vasculitis or significant stenosis.  Renals: Single left, widely  patent. Single right, with a short-segment stenosis of at least mild severity approximately 2 cm from its origin, patent distally.  IMA: Patent without evidence of aneurysm, dissection, vasculitis or significant stenosis. Arises just above the aneurysmal segment of the aorta.  RIGHT Lower Extremity  Inflow: Common iliac ectatic up to 2 cm diameter. Mild scattered calcified atheromatous plaque. No dissection or stenosis.  Outflow: Common femoral ectatic. Deep femoral branches patent. SFA mildly atheromatous without high-grade stenosis. Popliteal artery mildly atheromatous, patent  Runoff: Anterior tibial not seen below proximal calf. Peroneal and posterior tibial appear contiguous distally although there is incomplete arterial contrast enhancement.  LEFT Lower  Extremity  Inflow: Common iliac mildly atheromatous, ectatic up to 1.7 cm diameter. No dissection or stenosis.  Outflow: Common femoral atheromatous, patent. Deep femoral branches patent. SFA mildly atheromatous without high-grade stenosis. Popliteal atheromatous, patent.  Runoff: Limited evaluation secondary to incomplete arterial contrast opacification. I do suspect there is at least contiguous posterior tibial runoff across the ankle.  Veins: No obvious venous abnormality within the limitations of this arterial phase study. Patent bilateral renal veins and portal vein.  Review of the MIP images confirms the above findings.  NON-VASCULAR  Hepatobiliary: No focal liver abnormality is seen. No gallstones, gallbladder wall thickening, or biliary dilatation.  Pancreas: Unremarkable. No pancreatic ductal dilatation or surrounding inflammatory changes.  Spleen: Normal in size without focal abnormality.  Adrenals/Urinary Tract: Adrenals unremarkable. Kidneys enhance normally, without hydronephrosis. Urinary bladder incompletely distended.  Stomach/Bowel: Stomach incompletely distended. Small bowel  is nondilated. Appendix normal. The colon is nondilated, with innumerable distal descending and sigmoid diverticula; no significant adjacent inflammatory/edematous change or abscess.  Lymphatic: No abdominal or pelvic adenopathy.  Reproductive: Prostate is unremarkable.  Other: No ascites.  Left pelvic phleboliths.  No free air.  Musculoskeletal: Advanced degenerative disc disease L2-3, and L5-S1. No fracture or worrisome bone lesion.  IMPRESSION: 1. 4.4 cm infrarenal abdominal aortic aneurysm (previously 3.5 cm on 02/19/2016). Recommend followup by ultrasound in 1 year. This recommendation follows ACR consensus guidelines: White Paper of the ACR Incidental Findings Committee II on Vascular Findings. J Am Coll Radiol 2013; 10:789-794. 2. Ectatic bilateral common iliac arteries . 3. No significant lower extremity arterial outflow or runoff disease, with limited evaluation of tibial runoff. 4. Descending and sigmoid diverticulosis. 5. Lumbar degenerative disc disease L2-3, and L5-S1.  Aortic Atherosclerosis (ICD10-I70.0). Aortic aneurysm NOS (ICD10-I71.9).   Electronically Signed   By: Lucrezia Europe M.D.   On: 06/14/2019 12:02     Results for orders placed or performed in visit on 02/58/52  Basic Metabolic Panel (BMET)  Result Value Ref Range   Glucose 107 (H) 65 - 99 mg/dL   BUN 31 (H) 8 - 27 mg/dL   Creatinine, Ser 1.15 0.76 - 1.27 mg/dL   GFR calc non Af Amer 66 >59 mL/min/1.73   GFR calc Af Amer 76 >59 mL/min/1.73   BUN/Creatinine Ratio 27 (H) 10 - 24   Sodium 143 134 - 144 mmol/L   Potassium 4.5 3.5 - 5.2 mmol/L   Chloride 105 96 - 106 mmol/L   CO2 24 20 - 29 mmol/L   Calcium 9.7 8.6 - 10.2 mg/dL      Assessment & Plan:   Problem List Items Addressed This Visit    Weakness of both lower extremities   Postviral fatigue syndrome - Primary   History of COVID-19   Centrilobular emphysema (HCC)   Relevant Medications   Ipratropium-Albuterol (COMBIVENT)  20-100 MCG/ACT AERS respimat     #Post viral fatigue Syndrome / History of COVID Complication Centrilobular Emphysema (COPD) Dyspnea on Exertion Bilateral Leg Weakness and Myalgia  Chronic problems now for past approximately 3+ months after COVID19 infection He has had some mild improvement at times but overall his functional capacity and ability for performing job tasks and returning to work remains extremely limited.  He has already attempted multiple times to return to work with restrictions and accommodations in place for reduced lifting limits and exertion, and reduced hours and reduced shifts / increased break time every hour, however he has not been successful. Due to symptoms he was unable to work  part-time and reduced hours most recently.  Currently he is still not ready to return to work fully duty and there is no accommodation based on his job description that will allow him to return at a reduced capacity at this time.   The only accommodation I am recommending now is for Leave of Absence, and he will be placed on Long Term Disability  Anticipated to return to work with or without restrictions within 6 to 12 months, it is difficult to predict at this time, based on his slow gradual recovery. He is still being evaluated by specialists with previously Cardiologist and also Pulmonologist.  He may pursue Pulmonary rehab and further treatment of his underlying COPD Emphysema that is likely secondary cause for delaying his COVID19 recovery.  Concern that he has severe COPD and complex respiratory history with known difficulties with dysphagia in past has caused complicated course, and he is high risk of complications from [id number]  No further acute changes today. He should continue on current therapy and follow up Pulmonology - Will add Atrovent inhaler or can order Combivent combo rescue inhaler PRN  Will complete [employer] Paperwork and Job Description questionnaire on accommodations  and fax back to his HR.    Meds ordered this encounter  Medications  . Ipratropium-Albuterol (COMBIVENT) 20-100 MCG/ACT AERS respimat    Sig: Inhale 2 puffs into the lungs every 6 (six) hours.    Dispense:  4 g    Refill:  5      Follow up plan: Return in about 4 weeks (around 07/23/2019), or if symptoms worsen or fail to improve, for COPD / post COVID complications.   Nobie Putnam, [facility] 06/25/2019, 9:58 AM

## 2019-06-25 NOTE — Patient Instructions (Addendum)
Thank you for coming to the office today.  Completed forms.   Please schedule a Follow-up Appointment to: Return in about 4 weeks (around 07/23/2019), or if symptoms worsen or fail to improve, for COPD / post COVID complications.  If you have any other questions or concerns, please feel free to call the office or send a message through [facility]. You may also schedule an earlier appointment if necessary.  Additionally, you may be receiving a survey about your experience at our office within a few days to 1 week by e-mail or mail. We value your feedback.  Nobie Putnam, DO [place]

## 2019-06-25 NOTE — Telephone Encounter (Signed)
Will close encounter

## 2019-06-29 DIAGNOSIS — J449 Chronic obstructive pulmonary disease, unspecified: Secondary | ICD-10-CM | POA: Diagnosis not present

## 2019-07-02 ENCOUNTER — Other Ambulatory Visit: Payer: Self-pay

## 2019-07-02 ENCOUNTER — Ambulatory Visit (INDEPENDENT_AMBULATORY_CARE_PROVIDER_SITE_OTHER): Payer: BC Managed Care – PPO | Admitting: Vascular Surgery

## 2019-07-02 ENCOUNTER — Encounter (INDEPENDENT_AMBULATORY_CARE_PROVIDER_SITE_OTHER): Payer: Self-pay | Admitting: Vascular Surgery

## 2019-07-02 VITALS — BP 160/114 | HR 87 | Ht 73.0 in | Wt 190.0 lb

## 2019-07-02 DIAGNOSIS — E7800 Pure hypercholesterolemia, unspecified: Secondary | ICD-10-CM

## 2019-07-02 DIAGNOSIS — I712 Thoracic aortic aneurysm, without rupture: Secondary | ICD-10-CM

## 2019-07-02 DIAGNOSIS — I70213 Atherosclerosis of native arteries of extremities with intermittent claudication, bilateral legs: Secondary | ICD-10-CM | POA: Diagnosis not present

## 2019-07-02 DIAGNOSIS — I7121 Aneurysm of the ascending aorta, without rupture: Secondary | ICD-10-CM | POA: Insufficient documentation

## 2019-07-02 DIAGNOSIS — I10 Essential (primary) hypertension: Secondary | ICD-10-CM | POA: Diagnosis not present

## 2019-07-02 DIAGNOSIS — I70219 Atherosclerosis of native arteries of extremities with intermittent claudication, unspecified extremity: Secondary | ICD-10-CM | POA: Insufficient documentation

## 2019-07-02 DIAGNOSIS — I714 Abdominal aortic aneurysm, without rupture, unspecified: Secondary | ICD-10-CM | POA: Insufficient documentation

## 2019-07-02 NOTE — Progress Notes (Signed)
MRN : [id number]  Luke [name] is a 69 y.o. (20-Feb-1950) male who presents with chief complaint of  Chief Complaint  Patient presents with  . New Patient (Initial Visit)    aaa&thoracic aneurysm   .  History of Present Illness:   The patient presents to the office for evaluation of an abdominal aortic aneurysm. The aneurysm was found incidentally by CT scan several years ago. Patient denies abdominal pain or unusual back pain, no other abdominal complaints.  No history of an acute onset of painful blue discoloration of the toes.    In 2018 the aneurysm was 3.5 cm by CT scan   No family history of AAA.   Patient denies amaurosis fugax or TIA symptoms. There is no history of claudication or rest pain symptoms of the lower extremities.  The patient denies angina or shortness of breath.  CT scan done 06/14/2019 shows an AAA that measures 4.4 cm  Current Meds  Medication Sig  . albuterol (PROVENTIL) (2.5 MG/3ML) 0.083% nebulizer solution Take 3 mLs (2.5 mg total) by nebulization every 2 (two) hours as needed for wheezing. Dx: copd exacerbation J44.1  . albuterol (VENTOLIN HFA) 108 (90 Base) MCG/ACT inhaler Inhale 2 puffs into the lungs every 4 (four) hours as needed for wheezing or shortness of breath.  Marland Kitchen buPROPion (WELLBUTRIN SR) 150 MG 12 hr tablet Take 1 tablet (150 mg total) by mouth 2 (two) times daily.  . fluticasone (FLONASE) 50 MCG/ACT nasal spray Place 2 sprays into both nostrils daily. Use for 4-6 weeks then stop and use seasonally or as needed.  Marland Kitchen levothyroxine (SYNTHROID) 88 MCG tablet TAKE 1 TABLET BY MOUTH EVERY DAY ON AN EMPTY STOMACH  . meloxicam (MOBIC) 15 MG tablet TAKE 1 TABLET(15 MG) BY MOUTH DAILY  . Multiple Vitamin (MULTIVITAMIN WITH MINERALS) TABS tablet Take 1 tablet by mouth daily. One-A-Day Multivitamin  . nicotine (NICODERM CQ) 21 mg/24hr patch Place 1 patch (21 mg total) onto the skin daily.  . nicotine polacrilex (NICORETTE) 4 MG gum Take 1 each (4 mg  total) by mouth as needed for smoking cessation.  Marland Kitchen omeprazole (PRILOSEC) 20 MG capsule Take 1 capsule (20 mg total) by mouth daily before breakfast.  . umeclidinium-vilanterol (ANORO ELLIPTA) 62.5-25 MCG/INH AEPB Inhale 1 puff into the lungs daily.  Marland Kitchen VITAMIN D, ERGOCALCIFEROL, PO Take 1 tablet by mouth daily.  . vitamin E (VITAMIN E) 180 MG (400 UNITS) capsule Take 400 Units by mouth daily.  Marland Kitchen zinc sulfate 220 (50 Zn) MG capsule Take 220 mg by mouth daily.    Past Medical History:  Diagnosis Date  . Anxiety   . Arthritis   . Carcinoma metastatic to lymph node ([place])   . Cataract    right  . COPD (chronic obstructive pulmonary disease) ([place])   . Depression   . GERD (gastroesophageal reflux disease)   . Hoarseness of voice   . Personal history of tobacco use, presenting hazards to health February of 2001  . Thyroid disease     Past Surgical History:  Procedure Laterality Date  . BALLOON DILATION N/A 01/30/2018   Procedure: BALLOON DILATION;  Surgeon: Mauri Pole, MD;  Location: [facility] ENDOSCOPY;  Service: Endoscopy;  Laterality: N/A;  . COLONOSCOPY    . COLONOSCOPY WITH PROPOFOL N/A 11/29/2016   Procedure: COLONOSCOPY WITH PROPOFOL;  Surgeon: Manya Silvas, MD;  Location: [facility] ENDOSCOPY;  Service: Endoscopy;  Laterality: N/A;  . COLONOSCOPY WITH PROPOFOL N/A 08/22/2017   Procedure:  COLONOSCOPY WITH PROPOFOL;  Surgeon: Jonathon Bellows, MD;  Location: [facility] ENDOSCOPY;  Service: Gastroenterology;  Laterality: N/A;  . ESOPHAGEAL MANOMETRY N/A 10/05/2017   Procedure: ESOPHAGEAL MANOMETRY (EM);  Surgeon: Jonathon Bellows, MD;  Location: [facility] ENDOSCOPY;  Service: Gastroenterology;  Laterality: N/A;  . ESOPHAGEAL MANOMETRY N/A 01/30/2018   Procedure: ESOPHAGEAL MANOMETRY (EM);  Surgeon: Mauri Pole, MD;  Location: [facility] ENDOSCOPY;  Service: Endoscopy;  Laterality: N/A;  . ESOPHAGOGASTRODUODENOSCOPY N/A 03/09/2018   Procedure: ESOPHAGOGASTRODUODENOSCOPY (EGD);  Surgeon: Milus Banister, MD;   Location: [facility];  Service: Endoscopy;  Laterality: N/A;  . ESOPHAGOGASTRODUODENOSCOPY (EGD) WITH PROPOFOL N/A 11/29/2016   Procedure: ESOPHAGOGASTRODUODENOSCOPY (EGD) WITH PROPOFOL;  Surgeon: Manya Silvas, MD;  Location: [facility] ENDOSCOPY;  Service: Endoscopy;  Laterality: N/A;  . ESOPHAGOGASTRODUODENOSCOPY (EGD) WITH PROPOFOL N/A 08/22/2017   Procedure: ESOPHAGOGASTRODUODENOSCOPY (EGD) WITH PROPOFOL;  Surgeon: Jonathon Bellows, MD;  Location: [facility] ENDOSCOPY;  Service: Gastroenterology;  Laterality: N/A;  . ESOPHAGOGASTRODUODENOSCOPY (EGD) WITH PROPOFOL N/A 01/30/2018   Procedure: ESOPHAGOGASTRODUODENOSCOPY (EGD) WITH PROPOFOL;  Surgeon: Mauri Pole, MD;  Location: [facility] ENDOSCOPY;  Service: Endoscopy;  Laterality: N/A;  Mano probe to be placed during EGD  . EUS N/A 03/09/2018   Procedure: UPPER ENDOSCOPIC ULTRASOUND (EUS) RADIAL;  Surgeon: Milus Banister, MD;  Location: [facility] ENDOSCOPY;  Service: Endoscopy;  Laterality: N/A;  . NECK SURGERY     status post diagnosis of cancer with chemo and radiation  . SUBMUCOSAL INJECTION  03/09/2018   Procedure: SUBMUCOSAL INJECTION;  Surgeon: Milus Banister, MD;  Location: [facility] END[facility];  Service: Endoscopy;;  . TONSILLECTOMY      Social History Social History   Tobacco Use  . Smoking status: Current Some Day Smoker    Packs/day: 2.00    Years: 51.00    Pack years: 102.00    Types: Cigarettes    Start date: 11  . Smokeless tobacco: Former Systems developer  . Tobacco comment: 05/03/19- down to half a pack or less / day  Vaping Use  . Vaping Use: Never used  Substance Use Topics  . Alcohol use: No  . Drug use: No    Family History Family History  Problem Relation Age of Onset  . Breast cancer Mother   . Heart disease Mother   . Cancer Mother        breast, back, bladder  . Heart attack Father   . Heart disease Father   No family history of bleeding/clotting disorders, porphyria or autoimmune disease   No Known Allergies   REVIEW OF  SYSTEMS (Negative unless checked)  Constitutional: [] Weight loss  [] Fever  [] Chills Cardiac: [] Chest pain   [] Chest pressure   [] Palpitations   [] Shortness of breath when laying flat   [] Shortness of breath with exertion. Vascular:  [x] Pain in legs with walking   [x] Pain in legs at rest  [] History of DVT   [] Phlebitis   [] Swelling in legs   [] Varicose veins   [] Non-healing ulcers Pulmonary:   [] Uses home oxygen   [] Productive cough   [] Hemoptysis   [] Wheeze  [x] COPD   [] Asthma Neurologic:  [] Dizziness   [] Seizures   [] History of stroke   [] History of TIA  [] Aphasia   [] Vissual changes   [] Weakness or numbness in arm   [] Weakness or numbness in leg Musculoskeletal:   [] Joint swelling   [] Joint pain   [] Low back pain Hematologic:  [] Easy bruising  [] Easy bleeding   [] Hypercoagulable state   [] Anemic Gastrointestinal:  [] Diarrhea   [] Vomiting  [] Gastroesophageal  reflux/heartburn   [] Difficulty swallowing. Genitourinary:  [] Chronic kidney disease   [] Difficult urination  [] Frequent urination   [] Blood in urine Skin:  [] Rashes   [] Ulcers  Psychological:  [] History of anxiety   []  History of major depression.  Physical Examination  Vitals:   07/02/19 0841  BP: (!) 160/114  Pulse: 87  Weight: 190 lb (86.2 kg)  Height: 6\' 1"  (1.854 m)   Body mass index is 25.07 kg/m. Gen: WD/WN, NAD Head: [place]/AT, No temporalis wasting.  Ear/Nose/Throat: Hearing grossly intact, nares w/o erythema or drainage, poor dentition Eyes: PER, EOMI, sclera nonicteric.  Neck: Supple, no masses.  No bruit or JVD.  Pulmonary:  Good air movement, clear to auscultation bilaterally, no use of accessory muscles.  Cardiac: RRR, normal S1, S2, no Murmurs. Vascular:  No carotid bruits Vessel Right Left  Radial Palpable Palpable  PT Not Palpable Not Palpable  DP Not Palpable Not Palpable  Gastrointestinal: soft, non-distended. No guarding/no peritoneal signs.  Musculoskeletal: M/S 5/5 throughout.  No deformity or atrophy.   Neurologic: CN 2-12 intact. Pain and light touch intact in extremities.  Symmetrical.  Speech is fluent. Motor exam as listed above. Psychiatric: Judgment intact, Mood & affect appropriate for pt's clinical situation. Dermatologic: No rashes or ulcers noted.  No changes consistent with cellulitis.  CBC Lab Results  Component Value Date   WBC 11.6 (H) 03/21/2019   HGB 14.7 03/21/2019   HCT 44.7 03/21/2019   MCV 90.7 03/21/2019   PLT 310 03/21/2019    BMET    Component Value Date/Time   NA 143 06/04/2019 0849   NA 141 08/20/2013 1504   K 4.5 06/04/2019 0849   K 4.1 08/20/2013 1504   CL 105 06/04/2019 0849   CL 105 08/20/2013 1504   CO2 24 06/04/2019 0849   CO2 29 08/20/2013 1504   GLUCOSE 107 (H) 06/04/2019 0849   GLUCOSE 97 03/21/2019 1238   GLUCOSE 123 (H) 08/20/2013 1504   BUN 31 (H) 06/04/2019 0849   BUN 29 (H) 08/20/2013 1504   CREATININE 1.15 06/04/2019 0849   CREATININE 1.05 02/09/2019 1003   CALCIUM 9.7 06/04/2019 0849   CALCIUM 8.8 08/20/2013 1504   GFRNONAA 66 06/04/2019 0849   GFRNONAA 74 02/09/2019 1003   GFRAA 76 06/04/2019 0849   GFRAA 85 02/09/2019 1003   CrCl cannot be calculated (Patient's most recent lab result is older than the maximum 21 days allowed.).  COAG No results found for: INR, PROTIME  Radiology CT ANGIO AO+BIFEM W & OR WO CONTRAST  Result Date: 06/14/2019 CLINICAL DATA:  Abdominal aortic aneurysm surveillance. Bilateral lower extremity claudication, pain x3 months. 5 pound weight loss EXAM: CT ANGIOGRAPHY OF ABDOMINAL AORTA WITH ILIOFEMORAL RUNOFF TECHNIQUE: Multidetector CT imaging of the abdomen, pelvis and lower extremities was performed using the standard protocol during bolus administration of intravenous contrast. Multiplanar CT image reconstructions and MIPs were obtained to evaluate the vascular anatomy. CONTRAST:  192mL OMNIPAQUE IOHEXOL 350 MG/ML SOLN COMPARISON:  02/19/2016 and previous FINDINGS: VASCULAR Aorta: Moderate partially  calcified atheromatous plaque. Fusiform 4.4 x 4.2 cm infrarenal aneurysm, previously 3.5 cm, with a small amount of eccentric nonocclusive mural thrombus. No dissection or stenosis. Celiac: Patent without evidence of aneurysm, dissection, vasculitis or significant stenosis. SMA: Patent without evidence of aneurysm, dissection, vasculitis or significant stenosis. Renals: Single left, widely patent. Single right, with a short-segment stenosis of at least mild severity approximately 2 cm from its origin, patent distally. IMA: Patent without evidence of aneurysm, dissection,  vasculitis or significant stenosis. Arises just above the aneurysmal segment of the aorta. RIGHT Lower Extremity Inflow: Common iliac ectatic up to 2 cm diameter. Mild scattered calcified atheromatous plaque. No dissection or stenosis. Outflow: Common femoral ectatic. Deep femoral branches patent. SFA mildly atheromatous without high-grade stenosis. Popliteal artery mildly atheromatous, patent Runoff: Anterior tibial not seen below proximal calf. Peroneal and posterior tibial appear contiguous distally although there is incomplete arterial contrast enhancement. LEFT Lower Extremity Inflow: Common iliac mildly atheromatous, ectatic up to 1.7 cm diameter. No dissection or stenosis. Outflow: Common femoral atheromatous, patent. Deep femoral branches patent. SFA mildly atheromatous without high-grade stenosis. Popliteal atheromatous, patent. Runoff: Limited evaluation secondary to incomplete arterial contrast opacification. I do suspect there is at least contiguous posterior tibial runoff across the ankle. Veins: No obvious venous abnormality within the limitations of this arterial phase study. Patent bilateral renal veins and portal vein. Review of the MIP images confirms the above findings. NON-VASCULAR Hepatobiliary: No focal liver abnormality is seen. No gallstones, gallbladder wall thickening, or biliary dilatation. Pancreas: Unremarkable. No  pancreatic ductal dilatation or surrounding inflammatory changes. Spleen: Normal in size without focal abnormality. Adrenals/Urinary Tract: Adrenals unremarkable. Kidneys enhance normally, without hydronephrosis. Urinary bladder incompletely distended. Stomach/Bowel: Stomach incompletely distended. Small bowel is nondilated. Appendix normal. The colon is nondilated, with innumerable distal descending and sigmoid diverticula; no significant adjacent inflammatory/edematous change or abscess. Lymphatic: No abdominal or pelvic adenopathy. Reproductive: Prostate is unremarkable. Other: No ascites.  Left pelvic phleboliths.  No free air. Musculoskeletal: Advanced degenerative disc disease L2-3, and L5-S1. No fracture or worrisome bone lesion. IMPRESSION: 1. 4.4 cm infrarenal abdominal aortic aneurysm (previously 3.5 cm on 02/19/2016). Recommend followup by ultrasound in 1 year. This recommendation follows ACR consensus guidelines: White Paper of the ACR Incidental Findings Committee II on Vascular Findings. J Am Coll Radiol 2013; 10:789-794. 2. Ectatic bilateral common iliac arteries . 3. No significant lower extremity arterial outflow or runoff disease, with limited evaluation of tibial runoff. 4. Descending and sigmoid diverticulosis. 5. Lumbar degenerative disc disease L2-3, and L5-S1. Aortic Atherosclerosis (ICD10-I70.0). Aortic aneurysm NOS (ICD10-I71.9). Electronically Signed   By: Lucrezia Europe M.D.   On: 06/14/2019 12:02   CT ANGIO CHEST AORTA W/CM & OR WO/CM  Result Date: 06/14/2019 CLINICAL DATA:  Thoracic aortic aneurysm, follow-up. EXAM: CT ANGIOGRAPHY CHEST WITH CONTRAST TECHNIQUE: Multidetector CT imaging of the chest was performed using the standard protocol during bolus administration of intravenous contrast. Multiplanar CT image reconstructions and MIPs were obtained to evaluate the vascular anatomy. CONTRAST:  121mL OMNIPAQUE IOHEXOL 350 MG/ML SOLN COMPARISON:  01/02/2019 and previous FINDINGS:  Cardiovascular: Heart size normal. No pericardial effusion. The RV is nondilated. Satisfactory opacification of pulmonary arteries noted, and there is no evidence of pulmonary emboli. Aortic Root: --Valve: 2.5 cm --Sinuses: 3.3 cm --Sinotubular Junction: 3.2 cm Limitations by motion: Moderate Thoracic Aorta: --Ascending Aorta: 4.1 cm (previously 4.3) --Aortic Arch: 3 cm --Descending Aorta: 2.9 cm Mild partially calcified atheromatous plaque. Classic 3 vessel brachiocephalic arterial origin anatomy without proximal stenosis. Mediastinum/Nodes: No mediastinal or hilar adenopathy. Lungs/Pleura: No pleural effusion. No pneumothorax. Mild dependent atelectasis posteriorly in both lower lobes. Lungs otherwise clear. Musculoskeletal: Spondylitic changes in the lower cervical and upper thoracic spine. No fracture or worrisome bone lesion. Review of the MIP images confirms the above findings. IMPRESSION: 1. 4.1 cm ascending thoracic aortic aneurysm without complicating features. Recommend annual imaging followup by CTA or MRA. This recommendation follows 2010 ACCF/AHA/AATS/ACR/ASA/SCA/SCAI/SIR/STS/SVM Guidelines for the Diagnosis and Management of  Patients with Thoracic Aortic Disease. Circulation. 2010; 121: I778-E423. Aortic aneurysm NOS (ICD10-I71.9) 2.  Aortic Atherosclerosis (ICD10-170.0). Marland Kitchen Electronically Signed   By: Lucrezia Europe M.D.   On: 06/14/2019 12:17      Assessment/Plan 1. AAA (abdominal aortic aneurysm) without rupture (HCC) No surgery or intervention at this time. The patient has an asymptomatic abdominal aortic aneurysm that is greater than 4 cm but less than 5 cm in maximal diameter.  I have discussed the natural history of abdominal aortic aneurysm and the small risk of rupture for aneurysm less than 5 cm in size.  However, as these small aneurysms tend to enlarge over time, continued surveillance with ultrasound or CT scan is mandatory.  I have also discussed optimizing medical management with  hypertension and lipid control and the importance of abstinence from tobacco.  The patient is also encouraged to exercise a minimum of 30 minutes 4 times a week.  Should the patient develop new onset abdominal or back pain or signs of peripheral embolization they are instructed to seek medical attention immediately and to alert the physician providing care that they have an aneurysm.  The patient voices their understanding. I have scheduled the patient to return in 6 months with an aortic duplex. - VAS US AORTA/IVC/ILIACS; Future  2. Thoracic ascending aortic aneurysm ([place]) No surgery or intervention at this time.  The patient has an asymptomatic ascending thoracic aortic aneurysm that is less than 5.5 cm in maximal diameter.  I have discussed the natural history of ascending thoracic aortic aneurysm and the small risk of rupture for aneurysm less than 5.5 cm in size.  However, as these small aneurysms tend to enlarge over time, continued surveillance with CT scan is mandatory.  I have also discussed optimizing medical management with hypertension and lipid control and the importance of abstinence from tobacco.  The patient is also encouraged to exercise a minimum of 30 minutes 4 times a week.   Should the patient develop new onset abdominal or back pain or signs of peripheral embolization they are instructed to seek medical attention immediately and to alert the physician providing care that they have an aneurysm.   The patient voices their understanding. The patient will return in 12 months with an aortic duplex.   3. Atherosclerosis of native artery of both lower extremities with intermittent claudication (HCC)  Recommend:  The patient has evidence of atherosclerosis of the lower extremities with claudication.  The patient does not voice lifestyle limiting changes at this point in time.  Noninvasive studies do not suggest clinically significant change.  No invasive studies, angiography  or surgery at this time The patient should continue walking and begin a more formal exercise program.  The patient should continue antiplatelet therapy and aggressive treatment of the lipid abnormalities  No changes in the patient's medications at this time  The patient should continue wearing graduated compression socks 10-15 mmHg strength to control the mild edema.   - VAS [place] ABI WITH/WO TBI; Future  4. Benign essential hypertension Continue antihypertensive medications as already ordered, these medications have been reviewed and there are no changes at this time.   5. Pure hypercholesterolemia Continue statin as ordered and reviewed, no changes at this time    Hortencia Pilar, MD  07/02/2019 8:54 AM

## 2019-07-11 ENCOUNTER — Other Ambulatory Visit: Payer: Self-pay | Admitting: Family Medicine

## 2019-07-11 DIAGNOSIS — Z8579 Personal history of other malignant neoplasms of lymphoid, hematopoietic and related tissues: Secondary | ICD-10-CM

## 2019-07-23 ENCOUNTER — Other Ambulatory Visit: Payer: Self-pay | Admitting: Family Medicine

## 2019-07-23 DIAGNOSIS — Z8579 Personal history of other malignant neoplasms of lymphoid, hematopoietic and related tissues: Secondary | ICD-10-CM

## 2019-07-23 NOTE — Telephone Encounter (Signed)
Approved per protocol. Requested Prescriptions  Pending Prescriptions Disp Refills  . meloxicam (MOBIC) 15 MG tablet [Pharmacy Med Name: MELOXICAM 15MG  TABLETS] 90 tablet 0    Sig: TAKE 1 TABLET(15 MG) BY MOUTH DAILY     Analgesics:  COX2 Inhibitors Passed - 07/23/2019  9:48 AM      Passed - HGB in normal range and within 360 days    Hemoglobin  Date Value Ref Range Status  03/21/2019 14.7 13.0 - 17.0 g/dL Final   HGB  Date Value Ref Range Status  08/20/2013 14.6 13.0 - 18.0 g/dL Final         Passed - Cr in normal range and within 360 days    Creat  Date Value Ref Range Status  02/09/2019 1.05 0.70 - 1.25 mg/dL Final    Comment:    For patients >32 years of age, the reference limit for Creatinine is approximately 13% higher for people identified as African-American. .    Creatinine, Ser  Date Value Ref Range Status  06/04/2019 1.15 0.76 - 1.27 mg/dL Final         Passed - Patient is not pregnant      Passed - Valid encounter within last 12 months    Recent Outpatient Visits          4 weeks ago Postviral fatigue syndrome   [place], DO   2 months ago Postviral fatigue syndrome   [place], DO   2 months ago Centrilobular emphysema [facility])   [place], DO   3 months ago Centrilobular emphysema [facility])   [place], DO   3 months ago Postviral fatigue syndrome   [facility] Olin Hauser, DO      Future Appointments            In 1 month [place], Aaron Edelman, MD [facility], [place]   In 1 month [place], Barb Merino, DO [employer], [place]

## 2019-07-24 ENCOUNTER — Telehealth: Payer: Self-pay

## 2019-07-24 NOTE — Telephone Encounter (Signed)
Received drug change request from [facility] for Anoro.  I have attempted to contact [employer] to obtain cover alternatives and was placed on hold >61min.

## 2019-07-25 MED ORDER — STIOLTO RESPIMAT 2.5-2.5 MCG/ACT IN AERS
2.0000 | INHALATION_SPRAY | Freq: Every day | RESPIRATORY_TRACT | 2 refills | Status: DC
Start: 2019-07-25 — End: 2019-09-07

## 2019-07-25 NOTE — Telephone Encounter (Signed)
Can start highest dose of Stiolto

## 2019-07-25 NOTE — Telephone Encounter (Signed)
Rx for Stiolto has been sent to preferred pharmacy. Pt is aware and voiced his understanding.  Nothing further is needed.

## 2019-07-25 NOTE — Telephone Encounter (Signed)
Spoke to [place] with [facility], who stated that covered alternatives to Anoro are bevespi, combivent or stiolto.  Dr. Mortimer Fries, please advise. Thanks

## 2019-07-29 DIAGNOSIS — J449 Chronic obstructive pulmonary disease, unspecified: Secondary | ICD-10-CM | POA: Diagnosis not present

## 2019-08-08 ENCOUNTER — Other Ambulatory Visit: Payer: Self-pay | Admitting: Family Medicine

## 2019-08-08 DIAGNOSIS — Z8579 Personal history of other malignant neoplasms of lymphoid, hematopoietic and related tissues: Secondary | ICD-10-CM

## 2019-08-29 DIAGNOSIS — J449 Chronic obstructive pulmonary disease, unspecified: Secondary | ICD-10-CM | POA: Diagnosis not present

## 2019-09-04 ENCOUNTER — Ambulatory Visit: Payer: Medicare HMO | Admitting: Cardiology

## 2019-09-04 ENCOUNTER — Encounter: Payer: Self-pay | Admitting: Cardiology

## 2019-09-04 ENCOUNTER — Other Ambulatory Visit: Payer: Self-pay

## 2019-09-04 VITALS — BP 120/80 | HR 91 | Ht 73.0 in | Wt 187.2 lb

## 2019-09-04 DIAGNOSIS — I714 Abdominal aortic aneurysm, without rupture, unspecified: Secondary | ICD-10-CM

## 2019-09-04 DIAGNOSIS — I712 Thoracic aortic aneurysm, without rupture, unspecified: Secondary | ICD-10-CM

## 2019-09-04 DIAGNOSIS — F17200 Nicotine dependence, unspecified, uncomplicated: Secondary | ICD-10-CM

## 2019-09-04 DIAGNOSIS — IMO0001 Reserved for inherently not codable concepts without codable children: Secondary | ICD-10-CM

## 2019-09-04 DIAGNOSIS — I739 Peripheral vascular disease, unspecified: Secondary | ICD-10-CM

## 2019-09-04 DIAGNOSIS — E7800 Pure hypercholesterolemia, unspecified: Secondary | ICD-10-CM | POA: Diagnosis not present

## 2019-09-04 MED ORDER — ATORVASTATIN CALCIUM 40 MG PO TABS
40.0000 mg | ORAL_TABLET | Freq: Every day | ORAL | 5 refills | Status: DC
Start: 2019-09-04 — End: 2019-09-07

## 2019-09-04 MED ORDER — ASPIRIN EC 81 MG PO TBEC
81.0000 mg | DELAYED_RELEASE_TABLET | Freq: Every day | ORAL | 3 refills | Status: DC
Start: 2019-09-04 — End: 2019-09-07

## 2019-09-04 NOTE — Patient Instructions (Addendum)
Medication Instructions:   Your physician has recommended you make the following change in your medication:   1.  START taking atorvastatin (LIPITOR) 40 MG tablet: Take 1 tablet (40 mg total) by mouth daily. 2.  START taking aspirin EC 81 MG tablet: Take 1 tablet (81 mg total) by mouth daily  *If you need a refill on your cardiac medications before your next appointment, please call your pharmacy*   Lab Work:  Your physician recommends that you return for a FASTING lipid profile: just prior to your 6 month follow up. - You will need to be fasting. Please do not have anything to eat or drink after midnight the morning you have the lab work. You may only have water or black coffee with no cream or sugar. - Please go to the [facility]. You will check in at the front desk to the right as you walk into the atrium. Valet Parking is offered if needed. - No appointment needed. You may go any day between 7 am and 6 pm.  If you have labs (blood work) drawn today and your tests are completely normal, you will receive your results only by: Marland Kitchen MyChart Message (if you have MyChart) OR . A paper copy in the mail If you have any lab test that is abnormal or we need to change your treatment, we will call you to review the results.   Testing/Procedures: None Ordered   Follow-Up: At [facility], you and your health needs are our priority.  As part of our continuing mission to provide you with exceptional heart care, we have created designated Provider Care Teams.  These Care Teams include your primary Cardiologist (physician) and Advanced Practice Providers (APPs -  Physician Assistants and Nurse Practitioners) who all work together to provide you with the care you need, when you need it.  We recommend signing up for the patient portal called "MyChart".  Sign up information is provided on this After Visit Summary.  MyChart is used to connect with patients for Virtual Visits (Telemedicine).   Patients are able to view lab/test results, encounter notes, upcoming appointments, etc.  Non-urgent messages can be sent to your provider as well.   To learn more about what you can do with MyChart, go to [email or web address].    Your next appointment:   6 month(s)  The format for your next appointment:   In Person  Provider:   Kate Sable, MD   Other Instructions

## 2019-09-04 NOTE — Progress Notes (Signed)
Cardiology Office Note:    Date:  09/04/2019   ID:  Luke [name], DOB Feb 24, 1950, MRN [id number]  PCP:  Olin Hauser, DO  Cardiologist:  Kate Sable, MD  Electrophysiologist:  None   Referring MD: Nobie Putnam *   Chief Complaint  Patient presents with  . office visit    3 month F/U; Meds verbally reviewed with patient.    History of Present Illness:    Luke [name] is a 69 y.o. male with a hx of anxiety, COPD, abdominal aortic aneurysm (4.4cm infra renal CT 06/2019), mild thoracic aortic aneurysm 4.1 cm (CT 06/2019), current smoker x50+ years who presents for follow-up.  Patient was seen due to claudication.  Peripheral angiogram ordered to evaluate presence of PAD.  Results previously reviewed in the chart showing 4.4 cm infrarenal AAA, 4.1 cm ascending aorta aneurysm.  Referral to vascular surgery was placed and patient consulted with vascular surgery.  He has 6 months follow-up repeat imaging planned.  States doing okay, still smokes.  Past Medical History:  Diagnosis Date  . Anxiety   . Arthritis   . Carcinoma metastatic to lymph node ([place])   . Cataract    right  . COPD (chronic obstructive pulmonary disease) ([place])   . Depression   . GERD (gastroesophageal reflux disease)   . Hoarseness of voice   . Personal history of tobacco use, presenting hazards to health February of 2001  . Thyroid disease     Past Surgical History:  Procedure Laterality Date  . BALLOON DILATION N/A 01/30/2018   Procedure: BALLOON DILATION;  Surgeon: Mauri Pole, MD;  Location: [facility] ENDOSCOPY;  Service: Endoscopy;  Laterality: N/A;  . COLONOSCOPY    . COLONOSCOPY WITH PROPOFOL N/A 11/29/2016   Procedure: COLONOSCOPY WITH PROPOFOL;  Surgeon: Manya Silvas, MD;  Location: [facility] ENDOSCOPY;  Service: Endoscopy;  Laterality: N/A;  . COLONOSCOPY WITH PROPOFOL N/A 08/22/2017   Procedure: COLONOSCOPY WITH PROPOFOL;  Surgeon: Jonathon Bellows, MD;  Location: [facility]  ENDOSCOPY;  Service: Gastroenterology;  Laterality: N/A;  . ESOPHAGEAL MANOMETRY N/A 10/05/2017   Procedure: ESOPHAGEAL MANOMETRY (EM);  Surgeon: Jonathon Bellows, MD;  Location: [facility] ENDOSCOPY;  Service: Gastroenterology;  Laterality: N/A;  . ESOPHAGEAL MANOMETRY N/A 01/30/2018   Procedure: ESOPHAGEAL MANOMETRY (EM);  Surgeon: Mauri Pole, MD;  Location: [facility] ENDOSCOPY;  Service: Endoscopy;  Laterality: N/A;  . ESOPHAGOGASTRODUODENOSCOPY N/A 03/09/2018   Procedure: ESOPHAGOGASTRODUODENOSCOPY (EGD);  Surgeon: Milus Banister, MD;  Location: [facility] ENDOSCOPY;  Service: Endoscopy;  Laterality: N/A;  . ESOPHAGOGASTRODUODENOSCOPY (EGD) WITH PROPOFOL N/A 11/29/2016   Procedure: ESOPHAGOGASTRODUODENOSCOPY (EGD) WITH PROPOFOL;  Surgeon: Manya Silvas, MD;  Location: [facility] ENDOSCOPY;  Service: Endoscopy;  Laterality: N/A;  . ESOPHAGOGASTRODUODENOSCOPY (EGD) WITH PROPOFOL N/A 08/22/2017   Procedure: ESOPHAGOGASTRODUODENOSCOPY (EGD) WITH PROPOFOL;  Surgeon: Jonathon Bellows, MD;  Location: [facility] ENDOSCOPY;  Service: Gastroenterology;  Laterality: N/A;  . ESOPHAGOGASTRODUODENOSCOPY (EGD) WITH PROPOFOL N/A 01/30/2018   Procedure: ESOPHAGOGASTRODUODENOSCOPY (EGD) WITH PROPOFOL;  Surgeon: Mauri Pole, MD;  Location: [facility] ENDOSCOPY;  Service: Endoscopy;  Laterality: N/A;  Mano probe to be placed during EGD  . EUS N/A 03/09/2018   Procedure: UPPER ENDOSCOPIC ULTRASOUND (EUS) RADIAL;  Surgeon: Milus Banister, MD;  Location: [facility] ENDOSCOPY;  Service: Endoscopy;  Laterality: N/A;  . NECK SURGERY     status post diagnosis of cancer with chemo and radiation  . SUBMUCOSAL INJECTION  03/09/2018   Procedure: SUBMUCOSAL INJECTION;  Surgeon: Milus Banister, MD;  Location: [facility] ENDOSCOPY;  Service: Endoscopy;;  . TONSILLECTOMY      Current Medications: Current Meds  Medication Sig  . albuterol (PROVENTIL) (2.5 MG/3ML) 0.083% nebulizer solution Take 3 mLs (2.5 mg total) by nebulization every 2 (two) hours as needed for  wheezing. Dx: copd exacerbation J44.1  . albuterol (VENTOLIN HFA) 108 (90 Base) MCG/ACT inhaler Inhale 2 puffs into the lungs every 4 (four) hours as needed for wheezing or shortness of breath.  Marland Kitchen buPROPion (WELLBUTRIN SR) 150 MG 12 hr tablet Take 1 tablet (150 mg total) by mouth 2 (two) times daily.  . fluticasone (FLONASE) 50 MCG/ACT nasal spray Place 2 sprays into both nostrils daily. Use for 4-6 weeks then stop and use seasonally or as needed.  Marland Kitchen levothyroxine (SYNTHROID) 88 MCG tablet TAKE 1 TABLET BY MOUTH EVERY DAY ON AN EMPTY STOMACH  . meloxicam (MOBIC) 15 MG tablet TAKE 1 TABLET(15 MG) BY MOUTH DAILY  . Multiple Vitamin (MULTIVITAMIN WITH MINERALS) TABS tablet Take 1 tablet by mouth daily. One-A-Day Multivitamin  . nicotine (NICODERM CQ) 21 mg/24hr patch Place 1 patch (21 mg total) onto the skin daily.  . nicotine polacrilex (NICORETTE) 4 MG gum Take 1 each (4 mg total) by mouth as needed for smoking cessation.  Marland Kitchen omeprazole (PRILOSEC) 20 MG capsule Take 1 capsule (20 mg total) by mouth daily before breakfast.  . Tiotropium Bromide-Olodaterol (STIOLTO RESPIMAT) 2.5-2.5 MCG/ACT AERS Inhale 2 puffs into the lungs daily.  Marland Kitchen umeclidinium-vilanterol (ANORO ELLIPTA) 62.5-25 MCG/INH AEPB Inhale 1 puff into the lungs daily.  Marland Kitchen VITAMIN D, ERGOCALCIFEROL, PO Take 1 tablet by mouth daily.  . vitamin E (VITAMIN E) 180 MG (400 UNITS) capsule Take 400 Units by mouth daily.  Marland Kitchen zinc sulfate 220 (50 Zn) MG capsule Take 220 mg by mouth daily.     Allergies:   Patient has no known allergies.   Social History   Socioeconomic History  . Marital status: Single    Spouse name: Not on file  . Number of children: 1  . Years of education: [employer]  . Highest education level: High school graduate  Occupational History  . Not on file  Tobacco Use  . Smoking status: Current Every Day Smoker    Packs/day: 2.00    Years: 51.00    Pack years: 102.00    Types: Cigarettes    Start date: 43  .  Smokeless tobacco: Former Systems developer  . Tobacco comment: 05/03/19- down to half a pack or less / day  Vaping Use  . Vaping Use: Never used  Substance and Sexual Activity  . Alcohol use: No  . Drug use: No  . Sexual activity: Not on file  Other Topics Concern  . Not on file  Social History Narrative  . Not on file   Social Determinants of Health   Financial Resource Strain:   . Difficulty of Paying Living Expenses: Not on file  Food Insecurity:   . Worried About Charity fundraiser in the Last Year: Not on file  . Ran Out of Food in the Last Year: Not on file  Transportation Needs:   . Lack of Transportation (Medical): Not on file  . Lack of Transportation (Non-Medical): Not on file  Physical Activity:   . Days of Exercise per Week: Not on file  . Minutes of Exercise per Session: Not on file  Stress:   . Feeling of Stress : Not on file  Social Connections:   . Frequency of Communication with Friends and Family:  Not on file  . Frequency of Social Gatherings with Friends and Family: Not on file  . Attends Religious Services: Not on file  . Active Member of Clubs or Organizations: Not on file  . Attends Archivist Meetings: Not on file  . Marital Status: Not on file     Family History: The patient's family history includes Breast cancer in his mother; Cancer in his mother; Heart attack in his father; Heart disease in his father and mother.  ROS:   Please see the history of present illness.     All other systems reviewed and are negative.  EKGs/Labs/Other Studies Reviewed:    The following studies were reviewed today:   EKG:  EKG is  ordered today.  The ekg ordered today demonstrates normal sinus rhythm, normal ECG.  Recent Labs: 02/09/2019: ALT 13; TSH 3.00 03/21/2019: Hemoglobin 14.7; Platelets 310 06/04/2019: BUN 31; Creatinine, Ser 1.15; Potassium 4.5; Sodium 143  Recent Lipid Panel    Component Value Date/Time   CHOL 186 02/09/2019 1003   TRIG 75 02/09/2019  1003   HDL 38 (L) 02/09/2019 1003   CHOLHDL 4.9 02/09/2019 1003   LDLCALC 131 (H) 02/09/2019 1003    Physical Exam:    VS:  BP 120/80 (BP Location: Left Arm, Patient Position: Sitting, Cuff Size: Normal)   Pulse 91   Ht 6\' 1"  (1.854 m)   Wt 187 lb 4 oz (84.9 kg)   SpO2 96%   BMI 24.70 kg/m     Wt Readings from Last 3 Encounters:  09/04/19 187 lb 4 oz (84.9 kg)  07/02/19 190 lb (86.2 kg)  06/25/19 192 lb 9.6 oz (87.4 kg)     GEN:  Well nourished, well developed in no acute distress HEENT: Normal NECK: No JVD; No carotid bruits LYMPHATICS: No lymphadenopathy CARDIAC: RRR, no murmurs, rubs, gallops RESPIRATORY: Decreased breath sounds, exp ronchi noted ABDOMEN: Soft, non-tender, non-distended MUSCULOSKELETAL:  No edema; No deformity  SKIN: Warm and dry NEUROLOGIC:  Alert and oriented x 3 PSYCHIATRIC:  Normal affect   ASSESSMENT:    1. Claudication ([place])   2. Smoking   3. Abdominal aortic aneurysm (AAA) without rupture ([place])   4. Thoracic aortic aneurysm without rupture ([place])   5. Pure hypercholesterolemia    PLAN:    In order of problems listed above:   1. He describes symptoms of claudication, has risk factors of long smoking history.  CT angiogram of the peripheral arteries did not show any occlusive disease.  AAA, ectatic bilateral common iliac arteries noted. Asa 81mg  and lipitor 40 2. He is a current smoker x50 years.  Cessation advised. 3. He has AAA, infrarenal, 4.4 cm.  Follows up with vascular surgery, repeat scanning planned for about 6 months.   4. Mild ascending aortic aneurysm, 4.1 cm.  No intervention indicated at this time.  Repeat echo/imaging in about 3 to 5 years per [facility] AHA guidelines. 5. Patient has hyperlipidemia, 10-year ASCVD 19%.  Heart aspirin 81 mg, start Lipitor 40 mg daily.  Repeat fasting lipid profile prior to follow-up visit.  Follow-up 6 months  Total encounter time 40 minutes  Greater than 50% was spent in counseling and  coordination of care with the patient.   This note was generated in part or whole with voice recognition software. Voice recognition is usually quite accurate but there are transcription errors that can and very often do occur. I apologize for any typographical errors that were not detected and corrected.  Medication Adjustments/Labs and Tests Ordered: Current medicines are reviewed at length with the patient today.  Concerns regarding medicines are outlined above.  Orders Placed This Encounter  Procedures  . Lipid panel  . EKG 12-Lead   Meds ordered this encounter  Medications  . atorvastatin (LIPITOR) 40 MG tablet    Sig: Take 1 tablet (40 mg total) by mouth daily.    Dispense:  30 tablet    Refill:  5  . aspirin EC 81 MG tablet    Sig: Take 1 tablet (81 mg total) by mouth daily. Swallow whole.    Dispense:  90 tablet    Refill:  3    Patient Instructions  Medication Instructions:   Your physician has recommended you make the following change in your medication:   1.  START taking atorvastatin (LIPITOR) 40 MG tablet: Take 1 tablet (40 mg total) by mouth daily. 2.  START taking aspirin EC 81 MG tablet: Take 1 tablet (81 mg total) by mouth daily  *If you need a refill on your cardiac medications before your next appointment, please call your pharmacy*   Lab Work:  Your physician recommends that you return for a FASTING lipid profile: just prior to your 6 month follow up. - You will need to be fasting. Please do not have anything to eat or drink after midnight the morning you have the lab work. You may only have water or black coffee with no cream or sugar. - Please go to the [facility]. You will check in at the front desk to the right as you walk into the atrium. Valet Parking is offered if needed. - No appointment needed. You may go any day between 7 am and 6 pm.  If you have labs (blood work) drawn today and your tests are completely normal, you will receive your  results only by: Marland Kitchen MyChart Message (if you have MyChart) OR . A paper copy in the mail If you have any lab test that is abnormal or we need to change your treatment, we will call you to review the results.   Testing/Procedures: None Ordered   Follow-Up: At [facility], you and your health needs are our priority.  As part of our continuing mission to provide you with exceptional heart care, we have created designated Provider Care Teams.  These Care Teams include your primary Cardiologist (physician) and Advanced Practice Providers (APPs -  Physician Assistants and Nurse Practitioners) who all work together to provide you with the care you need, when you need it.  We recommend signing up for the patient portal called "MyChart".  Sign up information is provided on this After Visit Summary.  MyChart is used to connect with patients for Virtual Visits (Telemedicine).  Patients are able to view lab/test results, encounter notes, upcoming appointments, etc.  Non-urgent messages can be sent to your provider as well.   To learn more about what you can do with MyChart, go to [email or web address].    Your next appointment:   6 month(s)  The format for your next appointment:   In Person  Provider:   Kate Sable, MD   Other Instructions      Signed, Kate Sable, MD  09/04/2019 10:05 AM    [place]

## 2019-09-07 ENCOUNTER — Other Ambulatory Visit: Payer: Self-pay

## 2019-09-07 ENCOUNTER — Encounter: Payer: Self-pay | Admitting: Family Medicine

## 2019-09-07 ENCOUNTER — Ambulatory Visit (INDEPENDENT_AMBULATORY_CARE_PROVIDER_SITE_OTHER): Payer: Medicare HMO | Admitting: Family Medicine

## 2019-09-07 VITALS — BP 116/82 | HR 95 | Temp 98.4°F | Ht 71.0 in | Wt 186.4 lb

## 2019-09-07 DIAGNOSIS — R5383 Other fatigue: Secondary | ICD-10-CM

## 2019-09-07 DIAGNOSIS — I9589 Other hypotension: Secondary | ICD-10-CM

## 2019-09-07 DIAGNOSIS — Z8579 Personal history of other malignant neoplasms of lymphoid, hematopoietic and related tissues: Secondary | ICD-10-CM

## 2019-09-07 DIAGNOSIS — R7301 Impaired fasting glucose: Secondary | ICD-10-CM

## 2019-09-07 DIAGNOSIS — J432 Centrilobular emphysema: Secondary | ICD-10-CM

## 2019-09-07 DIAGNOSIS — J449 Chronic obstructive pulmonary disease, unspecified: Secondary | ICD-10-CM | POA: Diagnosis not present

## 2019-09-07 DIAGNOSIS — K219 Gastro-esophageal reflux disease without esophagitis: Secondary | ICD-10-CM | POA: Diagnosis not present

## 2019-09-07 DIAGNOSIS — E861 Hypovolemia: Secondary | ICD-10-CM

## 2019-09-07 DIAGNOSIS — Z125 Encounter for screening for malignant neoplasm of prostate: Secondary | ICD-10-CM

## 2019-09-07 DIAGNOSIS — G933 Postviral fatigue syndrome: Secondary | ICD-10-CM | POA: Diagnosis not present

## 2019-09-07 DIAGNOSIS — Z8616 Personal history of COVID-19: Secondary | ICD-10-CM

## 2019-09-07 DIAGNOSIS — G9331 Postviral fatigue syndrome: Secondary | ICD-10-CM

## 2019-09-07 LAB — BAYER DCA HB A1C WAIVED: HB A1C (BAYER DCA - WAIVED): 5.9 % (ref <7.0)

## 2019-09-07 MED ORDER — ALBUTEROL SULFATE (2.5 MG/3ML) 0.083% IN NEBU: 2.5000 mg | INHALATION_SOLUTION | RESPIRATORY_TRACT | 10 refills | Status: DC | PRN

## 2019-09-07 MED ORDER — CYANOCOBALAMIN 1000 MCG/ML IJ SOLN
1000.0000 ug | Freq: Once | INTRAMUSCULAR | Status: AC
  Administered 2019-09-07: 1000 ug via INTRAMUSCULAR

## 2019-09-07 MED ORDER — ASPIRIN EC 81 MG PO TBEC: 81.0000 mg | DELAYED_RELEASE_TABLET | Freq: Every day | ORAL | 3 refills | Status: DC

## 2019-09-07 MED ORDER — MELOXICAM 15 MG PO TABS: ORAL_TABLET | ORAL | 1 refills | Status: DC

## 2019-09-07 MED ORDER — FLUTICASONE PROPIONATE 50 MCG/ACT NA SUSP: 2.0000 | Freq: Every day | NASAL | 3 refills | Status: DC

## 2019-09-07 MED ORDER — ANORO ELLIPTA 62.5-25 MCG/INH IN AEPB: 1.0000 | INHALATION_SPRAY | Freq: Every day | RESPIRATORY_TRACT | 10 refills | Status: DC

## 2019-09-07 MED ORDER — OMEPRAZOLE 20 MG PO CPDR: 20.0000 mg | DELAYED_RELEASE_CAPSULE | Freq: Every day | ORAL | 1 refills | Status: DC

## 2019-09-07 MED ORDER — STIOLTO RESPIMAT 2.5-2.5 MCG/ACT IN AERS: 2.0000 | INHALATION_SPRAY | Freq: Every day | RESPIRATORY_TRACT | 2 refills | Status: DC

## 2019-09-07 MED ORDER — ALBUTEROL SULFATE HFA 108 (90 BASE) MCG/ACT IN AERS: 2.0000 | INHALATION_SPRAY | RESPIRATORY_TRACT | 10 refills | Status: DC | PRN

## 2019-09-07 MED ORDER — IPRATROPIUM-ALBUTEROL 20-100 MCG/ACT IN AERS: 2.0000 | INHALATION_SPRAY | Freq: Four times a day (QID) | RESPIRATORY_TRACT | 5 refills | Status: DC

## 2019-09-07 MED ORDER — LEVOTHYROXINE SODIUM 88 MCG PO TABS: 88.0000 ug | ORAL_TABLET | Freq: Every day | ORAL | 1 refills | Status: DC

## 2019-09-07 MED ORDER — ATORVASTATIN CALCIUM 40 MG PO TABS: 40.0000 mg | ORAL_TABLET | Freq: Every day | ORAL | 1 refills | Status: DC

## 2019-09-07 NOTE — Patient Instructions (Signed)
[facility] [place], [place] [id number] [place] [id number] [phone]

## 2019-09-07 NOTE — Progress Notes (Signed)
BP 116/82 (BP Location: Left Arm, Cuff Size: Normal)   Pulse 95   Temp 98.4 F (36.9 C) (Oral)   Ht 5\' 11"  (1.803 m)   Wt 186 lb 6.4 oz (84.6 kg)   SpO2 95%   BMI 26.00 kg/m    Subjective:    Patient ID: Luke [name], male    DOB: 03-02-1950, 69 y.o.   MRN: [id number]  HPI: Luke [name] is a 69 y.o. male who presents today to establish care.   Chief Complaint  Patient presents with  . Establish Care  . Dizziness   Had COVID back in February. Has been out of work since then due to post-viral fatigue syndrome. Follows with pulmonology and cardiology and vascular surgery. He has not been feeling any better.   DIZZINESS Duration: about a year, coming and going Description of symptoms: light headed, room spinning, feeling like he's going to pass out Duration of episode: 10-15 minutes Dizziness frequency: 1-2x a day Provoking factors: after lunch Aggravating factors:  none Triggered by rolling over in bed: no Triggered by bending over: yes Aggravated by head movement: no Aggravated by exertion, coughing, loud noise: no Recent head injury: no Recent or current viral symptoms: yes- sore throat and L ear pain History of vasovagal episodes: no Nausea: no Vomiting: yes occasionally due to aspiration Tinnitus: no Hearing loss: yes Aural fullness: no Headache: no Photophobia/phonophobia: no Unsteady gait: yes Postural instability: yes Diplopia, dysarthria, dysphagia or weakness: no Related to exertion: yes Pallor: yes Diaphoresis: no Dyspnea: yes Chest pain: no  COPD COPD status: stable Satisfied with current treatment?: yes Oxygen use: no Dyspnea frequency: with activity Cough frequency: with eating Rescue inhaler frequency:  About 1x a week Limitation of activity: yes Productive cough: yes Pneumovax: Up to Date Influenza: Not up to Date   Active Ambulatory Problems    Diagnosis Date Noted  . Personal history of tobacco use, presenting hazards to health  09/23/2014  . Gastroesophageal reflux disease 06/08/2013  . Benign essential hypertension 06/15/2013  . H/O lymphoma 06/15/2013  . Pure hypercholesterolemia 09/06/2014  . Tobacco abuse 09/06/2014  . Hypothyroidism 06/08/2013  . Centrilobular emphysema ([place]) 07/08/2017  . Dysphagia, pharyngeal phase 07/08/2017  . Pre-diabetes 08/29/2017  . Drug-induced myopathy 09/01/2017  . Anxiety 12/06/2017  . Esophageal dysphagia   . Submucosal lesion of esophagus   . Chronic bursitis of right shoulder 05/01/2018  . History of COVID-19 04/20/2019  . Postviral fatigue syndrome 06/25/2019  . Weakness of both lower extremities 06/25/2019  . AAA (abdominal aortic aneurysm) without rupture ([place]) 07/02/2019  . Thoracic ascending aortic aneurysm ([place]) 07/02/2019  . Atherosclerosis of native arteries of extremity with intermittent claudication ([place]) 07/02/2019   Resolved Ambulatory Problems    Diagnosis Date Noted  . Cancer of head and neck ([place]) 10/09/2015  . Aspiration pneumonia ([place]) 07/01/2017   Past Medical History:  Diagnosis Date  . Arthritis   . Carcinoma metastatic to lymph node ([place])   . Cataract   . COPD (chronic obstructive pulmonary disease) ([place])   . Depression   . GERD (gastroesophageal reflux disease)   . Hoarseness of voice   . Thyroid disease    Past Surgical History:  Procedure Laterality Date  . BALLOON DILATION N/A 01/30/2018   Procedure: BALLOON DILATION;  Surgeon: Mauri Pole, MD;  Location: [facility] ENDOSCOPY;  Service: Endoscopy;  Laterality: N/A;  . COLONOSCOPY    . COLONOSCOPY WITH PROPOFOL N/A 11/29/2016   Procedure: COLONOSCOPY WITH PROPOFOL;  Surgeon: Manya Silvas, MD;  Location: [facility] ENDOSCOPY;  Service: Endoscopy;  Laterality: N/A;  . COLONOSCOPY WITH PROPOFOL N/A 08/22/2017   Procedure: COLONOSCOPY WITH PROPOFOL;  Surgeon: Jonathon Bellows, MD;  Location: [facility] ENDOSCOPY;  Service: Gastroenterology;  Laterality: N/A;  . ESOPHAGEAL MANOMETRY N/A 10/05/2017    Procedure: ESOPHAGEAL MANOMETRY (EM);  Surgeon: Jonathon Bellows, MD;  Location: [facility] ENDOSCOPY;  Service: Gastroenterology;  Laterality: N/A;  . ESOPHAGEAL MANOMETRY N/A 01/30/2018   Procedure: ESOPHAGEAL MANOMETRY (EM);  Surgeon: Mauri Pole, MD;  Location: [facility] ENDOSCOPY;  Service: Endoscopy;  Laterality: N/A;  . ESOPHAGOGASTRODUODENOSCOPY N/A 03/09/2018   Procedure: ESOPHAGOGASTRODUODENOSCOPY (EGD);  Surgeon: Milus Banister, MD;  Location: [facility] ENDOSCOPY;  Service: Endoscopy;  Laterality: N/A;  . ESOPHAGOGASTRODUODENOSCOPY (EGD) WITH PROPOFOL N/A 11/29/2016   Procedure: ESOPHAGOGASTRODUODENOSCOPY (EGD) WITH PROPOFOL;  Surgeon: Manya Silvas, MD;  Location: [facility] ENDOSCOPY;  Service: Endoscopy;  Laterality: N/A;  . ESOPHAGOGASTRODUODENOSCOPY (EGD) WITH PROPOFOL N/A 08/22/2017   Procedure: ESOPHAGOGASTRODUODENOSCOPY (EGD) WITH PROPOFOL;  Surgeon: Jonathon Bellows, MD;  Location[facility] ENDOSCOPY;  Service: Gastroenterology;  Laterality: N/A;  . ESOPHAGOGASTRODUODENOSCOPY (EGD) WITH PROPOFOL N/A 01/30/2018   Procedure: ESOPHAGOGASTRODUODENOSCOPY (EGD) WITH PROPOFOL;  Surgeon: Mauri Pole, MD;  Location: [facility] ENDOSCOPY;  Service: Endoscopy;  Laterality: N/A;  Mano probe to be placed during EGD  . EUS N/A 03/09/2018   Procedure: UPPER ENDOSCOPIC ULTRASOUND (EUS) RADIAL;  Surgeon: Milus Banister, MD;  Location: [facility] ENDOSCOPY;  Service: Endoscopy;  Laterality: N/A;  . NECK SURGERY     status post diagnosis of cancer with chemo and radiation  . SUBMUCOSAL INJECTION  03/09/2018   Procedure: SUBMUCOSAL INJECTION;  Surgeon: Milus Banister, MD;  Location: [facility] ENDOSCOPY;  Service: Endoscopy;;  . TONSILLECTOMY     Outpatient Encounter Medications as of 09/07/2019  Medication Sig  . albuterol (PROVENTIL) (2.5 MG/3ML) 0.083% nebulizer solution Take 3 mLs (2.5 mg total) by nebulization every 2 (two) hours as needed for wheezing. Dx: copd exacerbation J44.1  . albuterol (VENTOLIN HFA) 108 (90 Base) MCG/ACT  inhaler Inhale 2 puffs into the lungs every 4 (four) hours as needed for wheezing or shortness of breath.  Marland Kitchen aspirin EC 81 MG tablet Take 1 tablet (81 mg total) by mouth daily. Swallow whole.  . fluticasone (FLONASE) 50 MCG/ACT nasal spray Place 2 sprays into both nostrils daily. Use for 4-6 weeks then stop and use seasonally or as needed.  Marland Kitchen levothyroxine (SYNTHROID) 88 MCG tablet Take 1 tablet (88 mcg total) by mouth daily before breakfast.  . meloxicam (MOBIC) 15 MG tablet TAKE 1 TABLET(15 MG) BY MOUTH DAILY  . Multiple Vitamin (MULTIVITAMIN WITH MINERALS) TABS tablet Take 1 tablet by mouth daily. One-A-Day Multivitamin  . nicotine polacrilex (NICORETTE) 4 MG gum Take 1 each (4 mg total) by mouth as needed for smoking cessation.  Marland Kitchen omeprazole (PRILOSEC) 20 MG capsule Take 1 capsule (20 mg total) by mouth daily before breakfast.  . Tiotropium Bromide-Olodaterol (STIOLTO RESPIMAT) 2.5-2.5 MCG/ACT AERS Inhale 2 puffs into the lungs daily.  Marland Kitchen umeclidinium-vilanterol (ANORO ELLIPTA) 62.5-25 MCG/INH AEPB Inhale 1 puff into the lungs daily.  Marland Kitchen VITAMIN D, ERGOCALCIFEROL, PO Take 1 tablet by mouth daily.  . vitamin E (VITAMIN E) 180 MG (400 UNITS) capsule Take 400 Units by mouth daily.  Marland Kitchen zinc sulfate 220 (50 Zn) MG capsule Take 220 mg by mouth daily.  . [DISCONTINUED] albuterol (PROVENTIL) (2.5 MG/3ML) 0.083% nebulizer solution Take 3 mLs (2.5 mg total) by nebulization every 2 (two) hours as  needed for wheezing. Dx: copd exacerbation J44.1  . [DISCONTINUED] albuterol (VENTOLIN HFA) 108 (90 Base) MCG/ACT inhaler Inhale 2 puffs into the lungs every 4 (four) hours as needed for wheezing or shortness of breath.  . [DISCONTINUED] aspirin EC 81 MG tablet Take 1 tablet (81 mg total) by mouth daily. Swallow whole.  . [DISCONTINUED] fluticasone (FLONASE) 50 MCG/ACT nasal spray Place 2 sprays into both nostrils daily. Use for 4-6 weeks then stop and use seasonally or as needed.  . [DISCONTINUED] levothyroxine  (SYNTHROID) 88 MCG tablet TAKE 1 TABLET BY MOUTH EVERY DAY ON AN EMPTY STOMACH  . [DISCONTINUED] meloxicam (MOBIC) 15 MG tablet TAKE 1 TABLET(15 MG) BY MOUTH DAILY  . [DISCONTINUED] omeprazole (PRILOSEC) 20 MG capsule Take 1 capsule (20 mg total) by mouth daily before breakfast.  . [DISCONTINUED] Tiotropium Bromide-Olodaterol (STIOLTO RESPIMAT) 2.5-2.5 MCG/ACT AERS Inhale 2 puffs into the lungs daily.  . [DISCONTINUED] umeclidinium-vilanterol (ANORO ELLIPTA) 62.5-25 MCG/INH AEPB Inhale 1 puff into the lungs daily.  Marland Kitchen atorvastatin (LIPITOR) 40 MG tablet Take 1 tablet (40 mg total) by mouth daily.  . Ipratropium-Albuterol (COMBIVENT) 20-100 MCG/ACT AERS respimat Inhale 2 puffs into the lungs every 6 (six) hours.  . nicotine (NICODERM CQ) 21 mg/24hr patch Place 1 patch (21 mg total) onto the skin daily. (Patient not taking: Reported on 09/07/2019)  . [DISCONTINUED] atorvastatin (LIPITOR) 40 MG tablet Take 1 tablet (40 mg total) by mouth daily. (Patient not taking: Reported on 09/07/2019)  . [DISCONTINUED] buPROPion (WELLBUTRIN SR) 150 MG 12 hr tablet Take 1 tablet (150 mg total) by mouth 2 (two) times daily.  . [DISCONTINUED] Ipratropium-Albuterol (COMBIVENT) 20-100 MCG/ACT AERS respimat Inhale 2 puffs into the lungs every 6 (six) hours. (Patient not taking: Reported on 07/02/2019)  . [DISCONTINUED] sertraline (ZOLOFT) 50 MG tablet Take 1 tablet (50 mg total) by mouth daily.  . [DISCONTINUED] vitamin B-12 (CYANOCOBALAMIN) 500 MCG tablet Take 500 mcg by mouth 2 (two) times daily.   . [EXPIRED] cyanocobalamin ((VITAMIN B-12)) injection 1,000 mcg    No facility-administered encounter medications on file as of 09/07/2019.   No Known Allergies Social History   Socioeconomic History  . Marital status: Single    Spouse name: Not on file  . Number of children: 1  . Years of education: [employer]  . Highest education level: High school graduate  Occupational History  . Not on file  Tobacco Use  .  Smoking status: Current Every Day Smoker    Packs/day: 2.00    Years: 51.00    Pack years: 102.00    Types: Cigarettes    Start date: 42  . Smokeless tobacco: Former Systems developer  . Tobacco comment: 05/03/19- down to half a pack or less / day  Vaping Use  . Vaping Use: Never used  Substance and Sexual Activity  . Alcohol use: No  . Drug use: No  . Sexual activity: Not on file  Other Topics Concern  . Not on file  Social History Narrative  . Not on file   Social Determinants of Health   Financial Resource Strain:   . Difficulty of Paying Living Expenses: Not on file  Food Insecurity:   . Worried About Charity fundraiser in the Last Year: Not on file  . Ran Out of Food in the Last Year: Not on file  Transportation Needs:   . Lack of Transportation (Medical): Not on file  . Lack of Transportation (Non-Medical): Not on file  Physical Activity:   . Days  of Exercise per Week: Not on file  . Minutes of Exercise per Session: Not on file  Stress:   . Feeling of Stress : Not on file  Social Connections:   . Frequency of Communication with Friends and Family: Not on file  . Frequency of Social Gatherings with Friends and Family: Not on file  . Attends Religious Services: Not on file  . Active Member of Clubs or Organizations: Not on file  . Attends Archivist Meetings: Not on file  . Marital Status: Not on file   Family History  Problem Relation Age of Onset  . Breast cancer Mother   . Heart disease Mother   . Cancer Mother        breast, back, bladder  . Heart attack Father   . Heart disease Father     Review of Systems  Constitutional: Positive for activity change and fatigue. Negative for appetite change, chills, diaphoresis, fever and unexpected weight change.  HENT: Negative.   Eyes: Negative.   Respiratory: Positive for shortness of breath. Negative for apnea, cough, choking, chest tightness, wheezing and stridor.   Cardiovascular: Positive for palpitations.  Negative for chest pain and leg swelling.  Gastrointestinal: Negative.   Genitourinary: Negative.   Musculoskeletal: Negative.   Neurological: Positive for dizziness, weakness and light-headedness. Negative for tremors, seizures, syncope, facial asymmetry, speech difficulty, numbness and headaches.  Hematological: Negative.   Psychiatric/Behavioral: Negative.     Per HPI unless specifically indicated above     Objective:    BP 116/82 (BP Location: Left Arm, Cuff Size: Normal)   Pulse 95   Temp 98.4 F (36.9 C) (Oral)   Ht 5\' 11"  (1.803 m)   Wt 186 lb 6.4 oz (84.6 kg)   SpO2 95%   BMI 26.00 kg/m   Wt Readings from Last 3 Encounters:  09/07/19 186 lb 6.4 oz (84.6 kg)  09/04/19 187 lb 4 oz (84.9 kg)  07/02/19 190 lb (86.2 kg)    No data found. Physical Exam Vitals and nursing note reviewed.  Constitutional:      General: He is not in acute distress.    Appearance: Normal appearance. He is not ill-appearing, toxic-appearing or diaphoretic.  HENT:     Head: Normocephalic and atraumatic.     Right Ear: External ear normal.     Left Ear: External ear normal.     Nose: Nose normal.     Mouth/Throat:     Mouth: Mucous membranes are moist.     Pharynx: Oropharynx is clear.  Eyes:     General: No scleral icterus.       Right eye: No discharge.        Left eye: No discharge.     Extraocular Movements: Extraocular movements intact.     Conjunctiva/sclera: Conjunctivae normal.     Pupils: Pupils are equal, round, and reactive to light.  Cardiovascular:     Rate and Rhythm: Normal rate and regular rhythm.     Pulses: Normal pulses.     Heart sounds: Normal heart sounds. No murmur heard.  No friction rub. No gallop.   Pulmonary:     Effort: Pulmonary effort is normal. No respiratory distress.     Breath sounds: Normal breath sounds. No stridor. No wheezing, rhonchi or rales.  Chest:     Chest wall: No tenderness.  Musculoskeletal:        General: Normal range of motion.      Cervical back: Normal range of  motion and neck supple.  Skin:    General: Skin is warm and dry.     Capillary Refill: Capillary refill takes less than 2 seconds.     Coloration: Skin is not jaundiced or pale.     Findings: No bruising, erythema, lesion or rash.  Neurological:     General: No focal deficit present.     Mental Status: He is alert and oriented to person, place, and time. Mental status is at baseline.  Psychiatric:        Mood and Affect: Mood normal.        Behavior: Behavior normal.        Thought Content: Thought content normal.        Judgment: Judgment normal.     Results for orders placed or performed in visit on 09/07/19  CBC with Differential/Platelet  Result Value Ref Range   WBC 8.3 3.4 - 10.8 x10E3/uL   RBC 4.65 4.14 - 5.80 x10E6/uL   Hemoglobin 13.8 13.0 - 17.7 g/dL   Hematocrit 42.4 37.5 - 51.0 %   MCV 91 79 - 97 fL   MCH 29.7 26.6 - 33.0 pg   MCHC 32.5 31 - 35 g/dL   RDW 13.0 11.6 - 15.4 %   Platelets 242 150 - 450 x10E3/uL   Neutrophils 78 Not Estab. %   Lymphs 15 Not Estab. %   Monocytes 5 Not Estab. %   Eos 1 Not Estab. %   Basos 1 Not Estab. %   Neutrophils Absolute 6.5 1 - 7 x10E3/uL   Lymphocytes Absolute 1.2 0 - 3 x10E3/uL   Monocytes Absolute 0.4 0 - 0 x10E3/uL   EOS (ABSOLUTE) 0.1 0.0 - 0.4 x10E3/uL   Basophils Absolute 0.0 0 - 0 x10E3/uL   Immature Granulocytes 0 Not Estab. %   Immature Grans (Abs) 0.0 0.0 - 0.1 x10E3/uL  Bayer DCA Hb A1c Waived  Result Value Ref Range   HB A1C (BAYER DCA - WAIVED) 5.9 <7.0 %  Comprehensive metabolic panel  Result Value Ref Range   Glucose 126 (H) 65 - 99 mg/dL   BUN 29 (H) 8 - 27 mg/dL   Creatinine, Ser 1.57 (H) 0.76 - 1.27 mg/dL   GFR calc non Af Amer 45 (L) >59 mL/min/1.73   GFR calc Af Amer 52 (L) >59 mL/min/1.73   BUN/Creatinine Ratio 18 10 - 24   Sodium 141 134 - 144 mmol/L   Potassium 4.2 3.5 - 5.2 mmol/L   Chloride 100 96 - 106 mmol/L   CO2 28 20 - 29 mmol/L   Calcium 10.7 (H) 8.6  - 10.2 mg/dL   Total Protein 6.9 6.0 - 8.5 g/dL   Albumin 4.5 3.8 - 4.8 g/dL   Globulin, Total 2.4 1.5 - 4.5 g/dL   Albumin/Globulin Ratio 1.9 1.2 - 2.2   Bilirubin Total 0.4 0.0 - 1.2 mg/dL   Alkaline Phosphatase 104 48 - 121 IU/L   AST 21 0 - 40 IU/L   ALT 12 0 - 44 IU/L  TSH  Result Value Ref Range   TSH 1.570 0.450 - 4.500 uIU/mL  PSA  Result Value Ref Range   Prostate Specific Ag, Serum 0.6 0.0 - 4.0 ng/mL  B12  Result Value Ref Range   Vitamin B-12 639 232 - 1,245 pg/mL      Assessment & Plan:   Problem List Items Addressed This Visit      Respiratory   Centrilobular emphysema (HCC)    Stable on  inhalers. Call with any concerns. Continue to follow with pulmonology. Call with any concerns.       Relevant Medications   umeclidinium-vilanterol (ANORO ELLIPTA) 62.5-25 MCG/INH AEPB   Tiotropium Bromide-Olodaterol (STIOLTO RESPIMAT) 2.5-2.5 MCG/ACT AERS   fluticasone (FLONASE) 50 MCG/ACT nasal spray   Ipratropium-Albuterol (COMBIVENT) 20-100 MCG/ACT AERS respimat   albuterol (VENTOLIN HFA) 108 (90 Base) MCG/ACT inhaler   albuterol (PROVENTIL) (2.5 MG/3ML) 0.083% nebulizer solution     Digestive   Gastroesophageal reflux disease    Under good control on current regimen. Continue current regimen. Continue to monitor. Call with any concerns. Refills up to date.        Relevant Medications   omeprazole (PRILOSEC) 20 MG capsule     Other   H/O lymphoma    Labs drawn today. Await results.       Relevant Medications   meloxicam (MOBIC) 15 MG tablet   levothyroxine (SYNTHROID) 88 MCG tablet   History of COVID-19    Not improving 6 months out. Will get him into post-COVID clinic- referral generated today. Checking labs to look for other causes. Call with any concerns. Continue to monitor.        Relevant Orders   CBC with Differential/Platelet (Completed)   Referral to Chronic Care Management Services   Ambulatory referral to Physical Medicine Rehab   Postviral  fatigue syndrome    Not improving 6 months out. Will get him into post-COVID clinic- referral generated today. Checking labs to look for other causes. Call with any concerns. Continue to monitor.        Relevant Orders   CBC with Differential/Platelet (Completed)   Referral to Chronic Care Management Services   Ambulatory referral to Physical Medicine Rehab    Other Visit Diagnoses    Hypotension due to hypovolemia    -  Primary   Significantly improved with water in the office. Advised increased hydration. Labs drawn today. Continue to monitor closely.    Relevant Medications   atorvastatin (LIPITOR) 40 MG tablet   aspirin EC 81 MG tablet   Other Relevant Orders   CBC with Differential/Platelet (Completed)   TSH (Completed)   Fatigue, unspecified type       Likely multifactorial with post-viral and hypotension. Will check labs today, await results. Treat as needed.    Relevant Medications   cyanocobalamin ((VITAMIN B-12)) injection 1,000 mcg (Completed)   Other Relevant Orders   CBC with Differential/Platelet (Completed)   TSH (Completed)   B12 (Completed)   Chronic obstructive pulmonary disease, unspecified COPD type ([place])       Relevant Medications   umeclidinium-vilanterol (ANORO ELLIPTA) 62.5-25 MCG/INH AEPB   Tiotropium Bromide-Olodaterol (STIOLTO RESPIMAT) 2.5-2.5 MCG/ACT AERS   fluticasone (FLONASE) 50 MCG/ACT nasal spray   Ipratropium-Albuterol (COMBIVENT) 20-100 MCG/ACT AERS respimat   albuterol (VENTOLIN HFA) 108 (90 Base) MCG/ACT inhaler   albuterol (PROVENTIL) (2.5 MG/3ML) 0.083% nebulizer solution   Screening for prostate cancer       Labs drawn today. Await results.    Relevant Orders   PSA (Completed)   IFG (impaired fasting glucose)       A1c drawn today. Await results.    Relevant Orders   Bayer DCA Hb A1c Waived (Completed)   Comprehensive metabolic panel (Completed)       Follow up plan: Return in about 4 weeks (around 10/05/2019).

## 2019-09-08 LAB — CBC WITH DIFFERENTIAL/PLATELET
Basophils Absolute: 0 10*3/uL (ref 0.0–0.2)
Basos: 1 %
EOS (ABSOLUTE): 0.1 10*3/uL (ref 0.0–0.4)
Eos: 1 %
Hematocrit: 42.4 % (ref 37.5–51.0)
Hemoglobin: 13.8 g/dL (ref 13.0–17.7)
Immature Grans (Abs): 0 10*3/uL (ref 0.0–0.1)
Immature Granulocytes: 0 %
Lymphocytes Absolute: 1.2 10*3/uL (ref 0.7–3.1)
Lymphs: 15 %
MCH: 29.7 pg (ref 26.6–33.0)
MCHC: 32.5 g/dL (ref 31.5–35.7)
MCV: 91 fL (ref 79–97)
Monocytes Absolute: 0.4 10*3/uL (ref 0.1–0.9)
Monocytes: 5 %
Neutrophils Absolute: 6.5 10*3/uL (ref 1.4–7.0)
Neutrophils: 78 %
Platelets: 242 10*3/uL (ref 150–450)
RBC: 4.65 x10E6/uL (ref 4.14–5.80)
RDW: 13 % (ref 11.6–15.4)
WBC: 8.3 10*3/uL (ref 3.4–10.8)

## 2019-09-08 LAB — COMPREHENSIVE METABOLIC PANEL
ALT: 12 IU/L (ref 0–44)
AST: 21 IU/L (ref 0–40)
Albumin/Globulin Ratio: 1.9 (ref 1.2–2.2)
Albumin: 4.5 g/dL (ref 3.8–4.8)
Alkaline Phosphatase: 104 IU/L (ref 48–121)
BUN/Creatinine Ratio: 18 (ref 10–24)
BUN: 29 mg/dL — ABNORMAL HIGH (ref 8–27)
Bilirubin Total: 0.4 mg/dL (ref 0.0–1.2)
CO2: 28 mmol/L (ref 20–29)
Calcium: 10.7 mg/dL — ABNORMAL HIGH (ref 8.6–10.2)
Chloride: 100 mmol/L (ref 96–106)
Creatinine, Ser: 1.57 mg/dL — ABNORMAL HIGH (ref 0.76–1.27)
GFR calc Af Amer: 52 mL/min/{1.73_m2} — ABNORMAL LOW (ref >59)
GFR calc non Af Amer: 45 mL/min/{1.73_m2} — ABNORMAL LOW (ref >59)
Globulin, Total: 2.4 g/dL (ref 1.5–4.5)
Glucose: 126 mg/dL — ABNORMAL HIGH (ref 65–99)
Potassium: 4.2 mmol/L (ref 3.5–5.2)
Sodium: 141 mmol/L (ref 134–144)
Total Protein: 6.9 g/dL (ref 6.0–8.5)

## 2019-09-08 LAB — PSA: Prostate Specific Ag, Serum: 0.6 ng/mL (ref 0.0–4.0)

## 2019-09-08 LAB — VITAMIN B12: Vitamin B-12: 639 pg/mL (ref 232–1245)

## 2019-09-08 LAB — TSH: TSH: 1.57 u[IU]/mL (ref 0.450–4.500)

## 2019-09-09 NOTE — Assessment & Plan Note (Signed)
Not improving 6 months out. Will get him into post-COVID clinic- referral generated today. Checking labs to look for other causes. Call with any concerns. Continue to monitor.

## 2019-09-09 NOTE — Assessment & Plan Note (Signed)
Under good control on current regimen. Continue current regimen. Continue to monitor. Call with any concerns. Refills up to date.   

## 2019-09-09 NOTE — Assessment & Plan Note (Signed)
Labs drawn today. Await results.  

## 2019-09-09 NOTE — Assessment & Plan Note (Signed)
Stable on inhalers. Call with any concerns. Continue to follow with pulmonology. Call with any concerns.

## 2019-09-10 ENCOUNTER — Telehealth: Payer: Self-pay

## 2019-09-10 NOTE — Chronic Care Management (AMB) (Signed)
  Chronic Care Management   Note  09/10/2019 Name: Luke [name] MRN: [id number] DOB: [birth year]  Luke [name] is a 69 y.o. year old male who is a primary care patient of Valerie Roys, DO. I reached out to [name] by phone today in response to a referral sent by [name]'s PCP, Dr. [name]      [name] was given information about Chronic Care Management services today including:  1. CCM service includes personalized support from designated clinical staff supervised by his physician, including individualized plan of care and coordination with other care providers 2. 24/7 contact phone numbers for assistance for urgent and routine care needs. 3. Service will only be billed when office clinical staff spend 20 minutes or more in a month to coordinate care. 4. Only one practitioner may furnish and bill the service in a calendar month. 5. The patient may stop CCM services at any time (effective at the end of the month) by phone call to the office staff. 6. The patient will be responsible for cost sharing (co-pay) of up to 20% of the service fee (after annual deductible is met).  Patient agreed to services and verbal consent obtained.   Follow up plan: Telephone appointment with care management team member scheduled for:10/10/2019  Noreene Larsson, [address], [place], [place] [address] Direct Dial: [phone] [email or web address] Website: [email or web address]

## 2019-09-11 ENCOUNTER — Telehealth: Payer: Self-pay

## 2019-09-11 NOTE — Telephone Encounter (Signed)
Pharmacy sent a fax question the direction for the Combivent inhaler. Written for 2 puffs Q6H but pharmacy stated typically is 1 puff Q6H. Please advise and resend to [facility] if needed.

## 2019-09-12 ENCOUNTER — Telehealth: Payer: Self-pay | Admitting: Family Medicine

## 2019-09-12 DIAGNOSIS — J432 Centrilobular emphysema: Secondary | ICD-10-CM

## 2019-09-12 NOTE — Telephone Encounter (Signed)
Copied from [place] [phone]. Topic: General - Call Back - No Documentation >> Sep 12, 2019 12:02 PM Erick Blinks wrote: Luke [name] needs clarification for Ipratropium-Albuterol (COMBIVENT) 20-100 MCG/ACT AERS respimat   Please advise, they need to confirm correct dosage. This was flagged as a high dosage.   Best contact:: [place]   Reference #: [id number]

## 2019-09-13 ENCOUNTER — Encounter: Payer: Self-pay | Admitting: Physical Medicine and Rehabilitation

## 2019-09-14 MED ORDER — IPRATROPIUM-ALBUTEROL 20-100 MCG/ACT IN AERS: 1.0000 | INHALATION_SPRAY | Freq: Four times a day (QID) | RESPIRATORY_TRACT | 5 refills | Status: DC | PRN

## 2019-09-14 NOTE — Telephone Encounter (Signed)
New Rx already sent in.

## 2019-09-14 NOTE — Telephone Encounter (Signed)
Daphne, from [facility], calling again to get an update. Please advise.

## 2019-09-29 DIAGNOSIS — J449 Chronic obstructive pulmonary disease, unspecified: Secondary | ICD-10-CM | POA: Diagnosis not present

## 2019-10-05 ENCOUNTER — Encounter: Payer: Self-pay | Admitting: Family Medicine

## 2019-10-05 ENCOUNTER — Ambulatory Visit (INDEPENDENT_AMBULATORY_CARE_PROVIDER_SITE_OTHER): Payer: Medicare HMO | Admitting: Family Medicine

## 2019-10-05 ENCOUNTER — Other Ambulatory Visit: Payer: Self-pay

## 2019-10-05 VITALS — BP 104/72 | HR 85 | Temp 98.3°F | Wt 184.0 lb

## 2019-10-05 DIAGNOSIS — Z8589 Personal history of malignant neoplasm of other organs and systems: Secondary | ICD-10-CM | POA: Diagnosis not present

## 2019-10-05 DIAGNOSIS — Z923 Personal history of irradiation: Secondary | ICD-10-CM

## 2019-10-05 DIAGNOSIS — Z23 Encounter for immunization: Secondary | ICD-10-CM

## 2019-10-05 DIAGNOSIS — R1313 Dysphagia, pharyngeal phase: Secondary | ICD-10-CM | POA: Diagnosis not present

## 2019-10-05 DIAGNOSIS — H6983 Other specified disorders of Eustachian tube, bilateral: Secondary | ICD-10-CM | POA: Diagnosis not present

## 2019-10-05 DIAGNOSIS — R1310 Dysphagia, unspecified: Secondary | ICD-10-CM

## 2019-10-05 DIAGNOSIS — R1319 Other dysphagia: Secondary | ICD-10-CM

## 2019-10-05 MED ORDER — PREDNISONE 50 MG PO TABS: 50.0000 mg | ORAL_TABLET | Freq: Every day | ORAL | 0 refills | Status: DC

## 2019-10-05 NOTE — Assessment & Plan Note (Signed)
Chronic. Seems to be getting worse. Has seen GI and speech. Will get him into ENT for further evaluation. Continue to monitor.

## 2019-10-05 NOTE — Progress Notes (Signed)
BP 104/72   Pulse 85   Temp 98.3 F (36.8 C) (Oral)   Wt 184 lb (83.5 kg)   BMI 25.66 kg/m    Subjective:    Patient ID: Luke [name], male    DOB: 1950-04-11, 69 y.o.   MRN: [id number]  HPI: Luke [name] is a 69 y.o. male  Chief Complaint  Patient presents with  . Follow-up    4w f/u  . Referral    pt states has had some swallowing problems and wants to see an especialist at [facility] Duration: over a month Involved ear(s): bilateral L>R Severity:  moderate  Quality:  "ear ache" Fever: no Otorrhea: no Upper respiratory infection symptoms: no Pruritus: yes, R only Hearing loss: yes Water immersion no Using Q-tips: yes Recurrent otitis media: no Status: worse Treatments attempted: none  Does well up until Lunch, but in the PM, very tired, and can't do anything. Breathing is doing OK. Occasionally dizzy, really watching his fluids.   Luke [name] has a remote history of cancer 20 years ago. He doesn't know what kind it was- he states that it was a squamous cell, but he has a history of lymphoma on his chart. He had radiation and surgery, but they state that nothing was removed after he was opened up. He has been having issues with swallowing for a couple of years now. He's seen speech therapy and GI in the past and has been chin-tucking when he swallows, but still is only able to eat about 1/2 his meal and eats very slowly.   DYSPHAGIA Duration: couple of years Description of symptom: food won't go down Onset: Immediately upon swallowing Location of dysphagia: throat Dysphagia to solids only: no Dysphagia to solids & liquids: yes  Frequency:constant  Progressively getting worse: yes Alleviatiating factors: vomiting Provoking factors: certain foods- hamburger Status: worse EGD: yes Weight loss: yes Sensation of lump in throat: no Heartburn: no Odynophagia: no Nausea: no Vomiting: yes Drooling/nasal regurgitation/food spillage:  no Coughing/choking/dysphonia: no Dysarthria: no Hematemesis: no Regurgitation of undigested food/halitosis: no Chest pain: no   Relevant past medical, surgical, family and social history reviewed and updated as indicated. Interim medical history since our last visit reviewed. Allergies and medications reviewed and updated.  Review of Systems  Constitutional: Negative.   HENT: Positive for ear pain, hearing loss, postnasal drip, sore throat and trouble swallowing. Negative for congestion, dental problem, drooling, ear discharge, facial swelling, mouth sores, nosebleeds, rhinorrhea, sinus pressure, sinus pain, sneezing, tinnitus and voice change.   Respiratory: Positive for cough. Negative for apnea, choking, chest tightness, shortness of breath, wheezing and stridor.   Cardiovascular: Negative.   Musculoskeletal: Negative.   Psychiatric/Behavioral: Negative.     Per HPI unless specifically indicated above     Objective:    BP 104/72   Pulse 85   Temp 98.3 F (36.8 C) (Oral)   Wt 184 lb (83.5 kg)   BMI 25.66 kg/m   Wt Readings from Last 3 Encounters:  10/05/19 184 lb (83.5 kg)  09/07/19 186 lb 6.4 oz (84.6 kg)  09/04/19 187 lb 4 oz (84.9 kg)    Physical Exam Vitals and nursing note reviewed.  Constitutional:      General: He is not in acute distress.    Appearance: Normal appearance. He is not ill-appearing, toxic-appearing or diaphoretic.  HENT:     Head: Normocephalic and atraumatic.     Right Ear: Ear canal and external ear normal. Tympanic membrane  is scarred. Tympanic membrane has decreased mobility.     Left Ear: Ear canal and external ear normal. Tympanic membrane is scarred. Tympanic membrane has decreased mobility.     Nose: Nose normal.     Mouth/Throat:     Mouth: Mucous membranes are moist.     Pharynx: Oropharynx is clear.     Comments: Post-nasal drip and cobblestoning Eyes:     General: No scleral icterus.       Right eye: No discharge.        Left  eye: No discharge.     Extraocular Movements: Extraocular movements intact.     Conjunctiva/sclera: Conjunctivae normal.     Pupils: Pupils are equal, round, and reactive to light.  Cardiovascular:     Rate and Rhythm: Normal rate and regular rhythm.     Pulses: Normal pulses.     Heart sounds: Normal heart sounds. No murmur heard.  No friction rub. No gallop.   Pulmonary:     Effort: Pulmonary effort is normal. No respiratory distress.     Breath sounds: Normal breath sounds. No stridor. No wheezing, rhonchi or rales.  Chest:     Chest wall: No tenderness.  Musculoskeletal:        General: Normal range of motion.     Cervical back: Normal range of motion and neck supple.  Lymphadenopathy:     Head:     Right side of head: No submental, submandibular, tonsillar, preauricular, posterior auricular or occipital adenopathy.     Left side of head: Submandibular and tonsillar adenopathy present. No submental, preauricular, posterior auricular or occipital adenopathy.     Cervical: Cervical adenopathy present.     Right cervical: No superficial, deep or posterior cervical adenopathy.    Left cervical: Superficial cervical adenopathy present. No deep or posterior cervical adenopathy.     Upper Body:     Right upper body: No supraclavicular, axillary, pectoral or epitrochlear adenopathy.     Left upper body: No supraclavicular, axillary, pectoral or epitrochlear adenopathy.  Skin:    General: Skin is warm and dry.     Capillary Refill: Capillary refill takes less than 2 seconds.     Coloration: Skin is not jaundiced or pale.     Findings: No bruising, erythema, lesion or rash.  Neurological:     General: No focal deficit present.     Mental Status: He is alert and oriented to person, place, and time. Mental status is at baseline.  Psychiatric:        Mood and Affect: Mood normal.        Behavior: Behavior normal.        Thought Content: Thought content normal.        Judgment: Judgment  normal.     Results for orders placed or performed in visit on 09/07/19  CBC with Differential/Platelet  Result Value Ref Range   WBC 8.3 3.4 - 10.8 x10E3/uL   RBC 4.65 4.14 - 5.80 x10E6/uL   Hemoglobin 13.8 13.0 - 17.7 g/dL   Hematocrit 42.4 37.5 - 51.0 %   MCV 91 79 - 97 fL   MCH 29.7 26.6 - 33.0 pg   MCHC 32.5 31 - 35 g/dL   RDW 13.0 11.6 - 15.4 %   Platelets 242 150 - 450 x10E3/uL   Neutrophils 78 Not Estab. %   Lymphs 15 Not Estab. %   Monocytes 5 Not Estab. %   Eos 1 Not Estab. %   Basos  1 Not Estab. %   Neutrophils Absolute 6.5 1 - 7 x10E3/uL   Lymphocytes Absolute 1.2 0 - 3 x10E3/uL   Monocytes Absolute 0.4 0 - 0 x10E3/uL   EOS (ABSOLUTE) 0.1 0.0 - 0.4 x10E3/uL   Basophils Absolute 0.0 0 - 0 x10E3/uL   Immature Granulocytes 0 Not Estab. %   Immature Grans (Abs) 0.0 0.0 - 0.1 x10E3/uL  Bayer DCA Hb A1c Waived  Result Value Ref Range   HB A1C (BAYER DCA - WAIVED) 5.9 <7.0 %  Comprehensive metabolic panel  Result Value Ref Range   Glucose 126 (H) 65 - 99 mg/dL   BUN 29 (H) 8 - 27 mg/dL   Creatinine, Ser 1.57 (H) 0.76 - 1.27 mg/dL   GFR calc non Af Amer 45 (L) >59 mL/min/1.73   GFR calc Af Amer 52 (L) >59 mL/min/1.73   BUN/Creatinine Ratio 18 10 - 24   Sodium 141 134 - 144 mmol/L   Potassium 4.2 3.5 - 5.2 mmol/L   Chloride 100 96 - 106 mmol/L   CO2 28 20 - 29 mmol/L   Calcium 10.7 (H) 8.6 - 10.2 mg/dL   Total Protein 6.9 6.0 - 8.5 g/dL   Albumin 4.5 3.8 - 4.8 g/dL   Globulin, Total 2.4 1.5 - 4.5 g/dL   Albumin/Globulin Ratio 1.9 1.2 - 2.2   Bilirubin Total 0.4 0.0 - 1.2 mg/dL   Alkaline Phosphatase 104 48 - 121 IU/L   AST 21 0 - 40 IU/L   ALT 12 0 - 44 IU/L  TSH  Result Value Ref Range   TSH 1.570 0.450 - 4.500 uIU/mL  PSA  Result Value Ref Range   Prostate Specific Ag, Serum 0.6 0.0 - 4.0 ng/mL  B12  Result Value Ref Range   Vitamin B-12 639 232 - 1,245 pg/mL      Assessment & Plan:   Problem List Items Addressed This Visit      Respiratory    Dysphagia, pharyngeal phase    Chronic. Seems to be getting worse. Has seen GI and speech. Will get him into ENT for further evaluation. Continue to monitor.         Digestive   Esophageal dysphagia    Chronic. Seems to be getting worse. Has seen GI and speech. Will get him into ENT for further evaluation. Continue to monitor.       Relevant Orders   Ambulatory referral to ENT    Other Visit Diagnoses    Dysfunction of both eustachian tubes    -  Primary   Will treat with prednisone. Call with any concerns. Given other issues, will get him into ENT. Continue to monitor.    Relevant Orders   Ambulatory referral to ENT   History of head and neck cancer       Referral to ENT made today. If can't get in in next couple of months, consider CT. Call with any concerns.    Relevant Orders   Ambulatory referral to ENT   History of therapeutic radiation       Referral to ENT made today. If can't get in in next couple of months, consider CT. Call with any concerns.    Relevant Orders   Ambulatory referral to ENT   Flu vaccine need       Flu shot given today.   Relevant Orders   Flu Vaccine QUAD High Dose(Fluad) (Completed)       Follow up plan: Return in about 2 months (around  12/05/2019).      

## 2019-10-10 ENCOUNTER — Telehealth: Payer: Self-pay | Admitting: Pharmacist

## 2019-10-10 ENCOUNTER — Telehealth: Payer: Medicare HMO

## 2019-10-10 NOTE — Progress Notes (Signed)
  Chronic Care Management   Outreach Note  10/10/2019 Name: Luke [name] MRN: [id number] DOB: March 11, 1950  Referred by: Valerie Roys, DO Reason for referral : No chief complaint on file.   I outreached Luke [name] today for our scheduled CCM initial visit. He requested reschedule as he was busy today.  Follow Up Plan: Initial outreach rescheduled for 11/26/19 at [place]. Kenton Kingfisher PharmD, [facility] [phone]

## 2019-10-10 NOTE — Chronic Care Management (AMB) (Unsigned)
Chronic Care Management Pharmacy  Name: Luke [name]  MRN: [id number] DOB: 1950-07-13   Chief Complaint/ HPI  Luke [name],  69 y.o. , male presents for their Initial CCM visit with the clinical pharmacist via telephone due to COVID-19 Pandemic.  PCP : Valerie Roys, DO Patient Care Team: Valerie Roys, DO as PCP - General (Family Medicine) Kate Sable, MD as PCP - Cardiology (Cardiology) Vladimir Faster, [facility] (Pharmacist)  Their chronic conditions include: Hypertension, Hyperlipidemia, Coronary Artery Disease, GERD, COPD, Hypothyroidism, Tobacco use and Allergic Rhinitis COVID infection Feb 2021   Office Visits: 10/05/19- 09/07/19- Dr. [name]- blood work  Consult Visit: 09/04/19- Dr. Knute Neu, Cardiology - lipid panel, ekg , aspirin 81 atorvastatin 40 mg h/o AAA 05/14/19-  Wyn Quaker, NP- Pulmonology -  No Known Allergies  Medications: Outpatient Encounter Medications as of 10/10/2019  Medication Sig  . albuterol (PROVENTIL) (2.5 MG/3ML) 0.083% nebulizer solution Take 3 mLs (2.5 mg total) by nebulization every 2 (two) hours as needed for wheezing. Dx: copd exacerbation J44.1  . albuterol (VENTOLIN HFA) 108 (90 Base) MCG/ACT inhaler Inhale 2 puffs into the lungs every 4 (four) hours as needed for wheezing or shortness of breath.  Marland Kitchen aspirin EC 81 MG tablet Take 1 tablet (81 mg total) by mouth daily. Swallow whole.  Marland Kitchen atorvastatin (LIPITOR) 40 MG tablet Take 1 tablet (40 mg total) by mouth daily.  . fluticasone (FLONASE) 50 MCG/ACT nasal spray Place 2 sprays into both nostrils daily. Use for 4-6 weeks then stop and use seasonally or as needed.  . Ipratropium-Albuterol (COMBIVENT) 20-100 MCG/ACT AERS respimat Inhale 1 puff into the lungs every 6 (six) hours as needed for wheezing.  Marland Kitchen levothyroxine (SYNTHROID) 88 MCG tablet Take 1 tablet (88 mcg total) by mouth daily before breakfast.  . meloxicam (MOBIC) 15 MG tablet TAKE 1 TABLET(15 MG) BY MOUTH DAILY  . Multiple  Vitamin (MULTIVITAMIN WITH MINERALS) TABS tablet Take 1 tablet by mouth daily. One-A-Day Multivitamin  . nicotine (NICODERM CQ) 21 mg/24hr patch Place 1 patch (21 mg total) onto the skin daily.  . nicotine polacrilex (NICORETTE) 4 MG gum Take 1 each (4 mg total) by mouth as needed for smoking cessation.  Marland Kitchen omeprazole (PRILOSEC) 20 MG capsule Take 1 capsule (20 mg total) by mouth daily before breakfast.  . predniSONE (DELTASONE) 50 MG tablet Take 1 tablet (50 mg total) by mouth daily with breakfast.  . Tiotropium Bromide-Olodaterol (STIOLTO RESPIMAT) 2.5-2.5 MCG/ACT AERS Inhale 2 puffs into the lungs daily.  Marland Kitchen umeclidinium-vilanterol (ANORO ELLIPTA) 62.5-25 MCG/INH AEPB Inhale 1 puff into the lungs daily.  Marland Kitchen VITAMIN D, ERGOCALCIFEROL, PO Take 1 tablet by mouth daily.  . vitamin E (VITAMIN E) 180 MG (400 UNITS) capsule Take 400 Units by mouth daily.  Marland Kitchen zinc sulfate 220 (50 Zn) MG capsule Take 220 mg by mouth daily.   No facility-administered encounter medications on file as of 10/10/2019.    Wt Readings from Last 3 Encounters:  10/05/19 184 lb (83.5 kg)  09/07/19 186 lb 6.4 oz (84.6 kg)  09/04/19 187 lb 4 oz (84.9 kg)    Current Diagnosis/Assessment:    Goals Addressed   None     COPD / Asthma / Tobacco   Last spirometry score: ***  Gold Grade: Gold 2 (FEV1 50-79%) Current COPD Classification:  B (high sx, <2 exacerbations/yr)  Eosinophil count:   Lab Results  Component Value Date/Time   EOSPCT 1.1 02/09/2019 10:03 AM   EOSPCT 3.1 08/20/2013  03:04 PM  %                               Eos (Absolute):  Lab Results  Component Value Date/Time   EOSABS 0.1 09/07/2019 03:35 PM   EOSABS 0.2 08/20/2013 03:04 PM    Tobacco Status:  Social History   Tobacco Use  Smoking Status Current Every Day Smoker  . Packs/day: 0.50  . Years: 51.00  . Pack years: 25.50  . Types: Cigarettes  . Start date: 1970  Smokeless Tobacco Former User  Tobacco Comment   05/03/19- down to half a  pack or less / day  05/14/2019-pulmonary function test-FVC 3.73 (73% predicted), postbronchodilator ratio 74, postbronchodilator FEV1 3.00 (79% predicted), no bronchodilator response, DLCO 28.03 (96% predicted)     102 pack year history  Patient has failed these meds in past: *** Patient is currently {CHL Controlled/Uncontrolled:[phone]} on the following medications: *** Using maintenance inhaler regularly? {yes/no:20286} Frequency of rescue inhaler use:  {CHL HP Upstream Pharm Inhaler APOL:[id number]}  We discussed:  {CHL HP Upstream Pharmacy discussion:[phone]}  Plan  Continue {CHL HP Upstream Pharmacy Plans:[phone]}    Medication Management   Pt uses *** pharmacy for all medications Uses pill box? {Yes or If no, why not?:20788} Pt endorses ***% compliance  We discussed: {Pharmacy options:24294}  Plan  {US Pharmacy OOIL:57972}    Follow up: *** month phone visit  ***

## 2019-10-12 ENCOUNTER — Other Ambulatory Visit: Payer: Self-pay

## 2019-10-12 ENCOUNTER — Telehealth (HOSPITAL_COMMUNITY): Payer: Self-pay | Admitting: *Deleted

## 2019-10-12 ENCOUNTER — Encounter: Payer: Medicare HMO | Attending: Physical Medicine and Rehabilitation | Admitting: Physical Medicine and Rehabilitation

## 2019-10-12 ENCOUNTER — Encounter: Payer: Self-pay | Admitting: Physical Medicine and Rehabilitation

## 2019-10-12 VITALS — BP 162/121 | HR 85 | Temp 98.4°F | Ht 73.0 in | Wt 184.0 lb

## 2019-10-12 DIAGNOSIS — F17210 Nicotine dependence, cigarettes, uncomplicated: Secondary | ICD-10-CM | POA: Diagnosis not present

## 2019-10-12 DIAGNOSIS — R5382 Chronic fatigue, unspecified: Secondary | ICD-10-CM | POA: Diagnosis not present

## 2019-10-12 DIAGNOSIS — G933 Postviral fatigue syndrome: Secondary | ICD-10-CM | POA: Diagnosis not present

## 2019-10-12 DIAGNOSIS — Z8616 Personal history of COVID-19: Secondary | ICD-10-CM | POA: Diagnosis not present

## 2019-10-12 DIAGNOSIS — J439 Emphysema, unspecified: Secondary | ICD-10-CM

## 2019-10-12 DIAGNOSIS — E559 Vitamin D deficiency, unspecified: Secondary | ICD-10-CM | POA: Insufficient documentation

## 2019-10-12 DIAGNOSIS — J449 Chronic obstructive pulmonary disease, unspecified: Secondary | ICD-10-CM | POA: Insufficient documentation

## 2019-10-12 MED ORDER — MODAFINIL 100 MG PO TABS: 100.0000 mg | ORAL_TABLET | Freq: Every day | ORAL | 1 refills | Status: DC

## 2019-10-12 NOTE — Telephone Encounter (Signed)
Received referral for this pt to participate in pulmonary rehab at [facility].  Noted that pt lives in [place] and spoke with pt.  Pt prefers to participate at [facility] pulmonary rehab program.  Will forward referral to their workque. Cherre Huger, BSN Cardiac and Training and development officer

## 2019-10-12 NOTE — Progress Notes (Signed)
Subjective:    Patient ID: Luke [name], male    DOB: [birth year], 69 y.o.   MRN: [id number]  HPI  Luke [name] presents with low energy after COVID diagnosis in February. He was told to start taking Vitamin D, C, Zinc. Hair started growing. From 6am to 12pm he can move around. After 12 he is exhausted. He feels weak. He is just pushing himself. His sister accompanies him today.  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 He was a work-a-holic before he got sick. He collapsed after that. He felt depressed.  He walks the dog 2 miles every morning  He is having a lot of brain fog.   He is getting better.   He continues to work.   He has a bed sense of taste because of his COVID.   Shows me his beautiful dog.    Pain Inventory Average Pain 0 Pain Right Now 0 My pain is no pain  In the last 24 hours, has pain interfered with the following? General activity 0 Relation with others 0 Enjoyment of life 10 What TIME of day is your pain at its worst? daytime Sleep (in general) Good  Pain is worse with: no pain Pain improves with: no pain Relief from Meds: no pain  do you drive?  yes  retired  weakness  new pt  new pt    Family History  Problem Relation Age of Onset  . Breast cancer Mother   . Heart disease Mother   . Cancer Mother        breast, back, bladder  . Heart attack Father   . Heart disease Father    Social History   Socioeconomic History  . Marital status: Single    Spouse name: Not on file  . Number of children: 1  . Years of education: [employer]  . Highest education level: High school graduate  Occupational History  . Not on file  Tobacco Use  . Smoking status: Current Every Day Smoker    Packs/day: 0.50    Years: 51.00    Pack years: 25.50    Types: Cigarettes    Start date: 38  . Smokeless tobacco: Former Systems developer  . Tobacco comment: 05/03/19- down to half a pack or less / day  Vaping Use  . Vaping Use: Never used  Substance and Sexual Activity    . Alcohol use: No  . Drug use: No  . Sexual activity: Not on file  Other Topics Concern  . Not on file  Social History Narrative  . Not on file   Social Determinants of Health   Financial Resource Strain:   . Difficulty of Paying Living Expenses: Not on file  Food Insecurity:   . Worried About Charity fundraiser in the Last Year: Not on file  . Ran Out of Food in the Last Year: Not on file  Transportation Needs:   . Lack of Transportation (Medical): Not on file  . Lack of Transportation (Non-Medical): Not on file  Physical Activity:   . Days of Exercise per Week: Not on file  . Minutes of Exercise per Session: Not on file  Stress:   . Feeling of Stress : Not on file  Social Connections:   . Frequency of Communication with Friends and Family: Not on file  . Frequency of Social Gatherings with Friends and Family: Not on file  . Attends Religious Services: Not on file  . Active Member of Clubs or Organizations: Not  on file  . Attends Archivist Meetings: Not on file  . Marital Status: Not on file   Past Surgical History:  Procedure Laterality Date  . BALLOON DILATION N/A 01/30/2018   Procedure: BALLOON DILATION;  Surgeon: Mauri Pole, MD;  Location: [facility] ENDOSCOPY;  Service: Endoscopy;  Laterality: N/A;  . COLONOSCOPY    . COLONOSCOPY WITH PROPOFOL N/A 11/29/2016   Procedure: COLONOSCOPY WITH PROPOFOL;  Surgeon: Manya Silvas, MD;  Location: [facility] ENDOSCOPY;  Service: Endoscopy;  Laterality: N/A;  . COLONOSCOPY WITH PROPOFOL N/A 08/22/2017   Procedure: COLONOSCOPY WITH PROPOFOL;  Surgeon: Jonathon Bellows, MD;  Location: [facility] ENDOSCOPY;  Service: Gastroenterology;  Laterality: N/A;  . ESOPHAGEAL MANOMETRY N/A 10/05/2017   Procedure: ESOPHAGEAL MANOMETRY (EM);  Surgeon: Jonathon Bellows, MD;  Location: [facility] ENDOSCOPY;  Service: Gastroenterology;  Laterality: N/A;  . ESOPHAGEAL MANOMETRY N/A 01/30/2018   Procedure: ESOPHAGEAL MANOMETRY (EM);  Surgeon: Mauri Pole, MD;  Location: [facility] ENDOSCOPY;  Service: Endoscopy;  Laterality: N/A;  . ESOPHAGOGASTRODUODENOSCOPY N/A 03/09/2018   Procedure: ESOPHAGOGASTRODUODENOSCOPY (EGD);  Surgeon: Milus Banister, MD;  Location: [facility] ENDOSCOPY;  Service: Endoscopy;  Laterality: N/A;  . ESOPHAGOGASTRODUODENOSCOPY (EGD) WITH PROPOFOL N/A 11/29/2016   Procedure: ESOPHAGOGASTRODUODENOSCOPY (EGD) WITH PROPOFOL;  Surgeon: Manya Silvas, MD;  Location: [facility] ENDOSCOPY;  Service: Endoscopy;  Laterality: N/A;  . ESOPHAGOGASTRODUODENOSCOPY (EGD) WITH PROPOFOL N/A 08/22/2017   Procedure: ESOPHAGOGASTRODUODENOSCOPY (EGD) WITH PROPOFOL;  Surgeon: Jonathon Bellows, MD;  Location: [facility] ENDOSCOPY;  Service: Gastroenterology;  Laterality: N/A;  . ESOPHAGOGASTRODUODENOSCOPY (EGD) WITH PROPOFOL N/A 01/30/2018   Procedure: ESOPHAGOGASTRODUODENOSCOPY (EGD) WITH PROPOFOL;  Surgeon: Mauri Pole, MD;  Location: [facility] ENDOSCOPY;  Service: Endoscopy;  Laterality: N/A;  Mano probe to be placed during EGD  . EUS N/A 03/09/2018   Procedure: UPPER ENDOSCOPIC ULTRASOUND (EUS) RADIAL;  Surgeon: Milus Banister, MD;  Location: [facility] ENDOSCOPY;  Service: Endoscopy;  Laterality: N/A;  . NECK SURGERY     status post diagnosis of cancer with chemo and radiation  . SUBMUCOSAL INJECTION  03/09/2018   Procedure: SUBMUCOSAL INJECTION;  Surgeon: Milus Banister, MD;  Location: [facility] ENDOSCOPY;  Service: Endoscopy;;  . TONSILLECTOMY     Past Medical History:  Diagnosis Date  . Anxiety   . Arthritis   . Carcinoma metastatic to lymph node ([place])   . Cataract    right  . COPD (chronic obstructive pulmonary disease) ([place])   . Depression   . GERD (gastroesophageal reflux disease)   . Hoarseness of voice   . Personal history of tobacco use, presenting hazards to health February of 2001  . Thyroid disease    BP (!) 162/121   Pulse 85   Temp 98.4 F (36.9 C)   Ht 6\' 1"  (1.854 m)   Wt 184 lb (83.5 kg)   SpO2 98%   BMI 24.28 kg/m   Opioid Risk Score:   Fall  Risk Score:  `1  Depression screen PHQ 2/9  Depression screen [facility] 2/9 09/07/2019 05/10/2019 04/09/2019 03/28/2019 02/16/2019 05/01/2018 04/14/2018  Decreased Interest 0 3 3 0 0 0 0  Down, Depressed, Hopeless 0 1 2 0 0 0 0  PHQ - 2 Score 0 4 5 0 0 0 0  Altered sleeping 0 2 1 - - - -  Tired, decreased energy 3 3 3  - - - -  Change in appetite 0 0 1 - - - -  Feeling bad or failure about yourself  0 3 3 - - - -  Trouble concentrating 0 3 2 - - - -  Moving slowly or fidgety/restless 0 0 2 - - - -  Suicidal thoughts 0 1 2 - - - -  PHQ-9 Score 3 16 19  - - - -  Difficult doing work/chores Not difficult at all Somewhat difficult Extremely dIfficult - - - -    Review of Systems  Neurological: Positive for weakness.  All other systems reviewed and are negative.      Objective:   Physical Exam Gen: no distress, normal appearing HEENT: oral mucosa pink and moist, NCAT Cardio: Reg rate Chest: normal effort, normal rate of breathing Abd: soft, non-distended Ext: no edema Skin: intact Neuro: Alert and oriented x3 Psych: pleasant, normal affect     Assessment & Plan:  1) Post-COVID fatigue and brain fog -Cardiopulmonary rehab ordered -Discussed natural progression of disease -Modafinil 100mg  prescribed. Provided with my cell phone number and requested that patient let me know response to medication in about a week.  -Continue to work as long as possible for mental stimulation -Showed picture of incentive spirometer and advised 10x per hour -Continue walking dog 3 miles per day  2) Vitamin D deficiency -Check level today. I will call tomorrow with results  3) Smoking cessation: 7 minutes spent discussing current smoking habits, risk of lung cancer, prior smoking habits, medications/patches tried. Made goal to decrease # of cigarrettes from 10-12 per day to no more than 10 per day. Next week decrease to 9 if possible. If not, maintain at no more than 10 until next visit.   4)  COPD -IS -Cardiopulmonary rehab  All questions answered. RTC in 4 weeks.

## 2019-10-13 ENCOUNTER — Other Ambulatory Visit: Payer: Self-pay | Admitting: Physical Medicine and Rehabilitation

## 2019-10-13 LAB — VITAMIN D 25 HYDROXY (VIT D DEFICIENCY, FRACTURES): Vit D, 25-Hydroxy: 60.4 ng/mL (ref 30.0–100.0)

## 2019-10-13 MED ORDER — VITAMIN D (ERGOCALCIFEROL) 1.25 MG (50000 UNIT) PO CAPS: 50000.0000 [IU] | ORAL_CAPSULE | ORAL | 0 refills | Status: DC

## 2019-10-13 MED ORDER — VITAMIN D (ERGOCALCIFEROL) 1.25 MG (50000 UNIT) PO CAPS
50000.0000 [IU] | ORAL_CAPSULE | ORAL | 0 refills | Status: DC
Start: 2019-10-13 — End: 2021-06-01

## 2019-10-15 ENCOUNTER — Other Ambulatory Visit: Payer: Self-pay | Admitting: *Deleted

## 2019-10-15 DIAGNOSIS — J449 Chronic obstructive pulmonary disease, unspecified: Secondary | ICD-10-CM

## 2019-10-17 ENCOUNTER — Telehealth: Payer: Self-pay

## 2019-10-17 ENCOUNTER — Other Ambulatory Visit: Payer: Self-pay | Admitting: Physical Medicine and Rehabilitation

## 2019-10-17 MED ORDER — METHYLPHENIDATE HCL 5 MG PO TABS
5.0000 mg | ORAL_TABLET | Freq: Every day | ORAL | 0 refills | Status: DC
Start: 2019-10-17 — End: 2019-11-13

## 2019-10-17 NOTE — Telephone Encounter (Signed)
I will send alternative stimulant- methylphenidate- thank you!

## 2019-10-17 NOTE — Telephone Encounter (Signed)
Luke [name] has been denied by [facility].. Please advise.  Thank you

## 2019-10-22 ENCOUNTER — Telehealth: Payer: Self-pay | Admitting: *Deleted

## 2019-10-22 NOTE — Telephone Encounter (Signed)
Ms Riggan(EC) for Mr Perine called and reports [employer] [facility] will not cover the modanifil. Please advise.

## 2019-10-23 NOTE — Telephone Encounter (Signed)
Notified Mrs Riggin.

## 2019-10-23 NOTE — Telephone Encounter (Signed)
I have sent methylphenidate instead!

## 2019-10-29 DIAGNOSIS — J449 Chronic obstructive pulmonary disease, unspecified: Secondary | ICD-10-CM | POA: Diagnosis not present

## 2019-11-13 ENCOUNTER — Other Ambulatory Visit: Payer: Self-pay

## 2019-11-13 ENCOUNTER — Encounter: Payer: Medicare HMO | Attending: Physical Medicine and Rehabilitation | Admitting: Physical Medicine and Rehabilitation

## 2019-11-13 ENCOUNTER — Encounter: Payer: Self-pay | Admitting: Physical Medicine and Rehabilitation

## 2019-11-13 VITALS — BP 94/69 | HR 69 | Temp 98.5°F | Ht 73.0 in | Wt 187.0 lb

## 2019-11-13 DIAGNOSIS — R1310 Dysphagia, unspecified: Secondary | ICD-10-CM | POA: Insufficient documentation

## 2019-11-13 DIAGNOSIS — J439 Emphysema, unspecified: Secondary | ICD-10-CM

## 2019-11-13 DIAGNOSIS — R5382 Chronic fatigue, unspecified: Secondary | ICD-10-CM | POA: Diagnosis not present

## 2019-11-13 DIAGNOSIS — Z8616 Personal history of COVID-19: Secondary | ICD-10-CM | POA: Insufficient documentation

## 2019-11-13 MED ORDER — METHYLPHENIDATE HCL 5 MG PO TABS: 5.0000 mg | ORAL_TABLET | Freq: Three times a day (TID) | ORAL | 0 refills | Status: DC | PRN

## 2019-11-13 NOTE — Progress Notes (Signed)
Subjective:    Patient ID: Luke [name], male    DOB: [birth year], 69 y.o.   MRN: [id number]  HPI  Mr. Dimalanta presents with low energy after COVID diagnosis in February. He was told to start taking Vitamin D, C, Zinc. Hair started growing. From 6am to 12pm he can move around. After 12 he is exhausted. He feels weak. He is just pushing himself. His sister accompanies him today.  1) Low energy: He continues to have low energy. He walks 2 hours per day. He continues to take his supplements. He has stopped working. He can't work because of his fatigue. He has a good mood. Sense of taste has not improved yet. He has not noted any benefit from the methylphenidate. Since last visit we increased his dose to 10mg  daily. He is agreeable to trying increase to 15mg .   2) Active smoking: He has increased his daily cigarette usage from 10-12 cigarettes last visit to 19 cigarettes daily currently.   3) Difficulty swallowing: He has what sounds like radiating fibrosis and does not produce enough saliva- often aspirates foods, especially liquids like milkshakes. Has been through swallow therapy.   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 He was a work-a-holic before he got sick. He collapsed after that. He felt depressed.  He walks the dog 2 miles every morning  He is having a lot of brain fog.   He is getting better.   He continues to work.   He has a bed sense of taste because of his COVID.   Shows me his beautiful dog.    Pain Inventory Average Pain 0 Pain Right Now 0 My pain is no pain  In the last 24 hours, has pain interfered with the following? General activity 8 Relation with others 8 Enjoyment of life 8 What TIME of day is your pain at its worst? daytime and evening Sleep (in general) Good  Pain is worse with: no pain Pain improves with: no pain Relief from Meds: no pain      Family History  Problem Relation Age of Onset  . Breast cancer Mother   . Heart disease  Mother   . Cancer Mother        breast, back, bladder  . Heart attack Father   . Heart disease Father    Social History   Socioeconomic History  . Marital status: Single    Spouse name: Not on file  . Number of children: 1  . Years of education: [employer]  . Highest education level: High school graduate  Occupational History  . Not on file  Tobacco Use  . Smoking status: Current Every Day Smoker    Packs/day: 0.50    Years: 51.00    Pack years: 25.50    Types: Cigarettes    Start date: 60  . Smokeless tobacco: Former Systems developer  . Tobacco comment: 05/03/19- down to half a pack or less / day  Vaping Use  . Vaping Use: Never used  Substance and Sexual Activity  . Alcohol use: No  . Drug use: No  . Sexual activity: Not on file  Other Topics Concern  . Not on file  Social History Narrative  . Not on file   Social Determinants of Health   Financial Resource Strain:   . Difficulty of Paying Living Expenses: Not on file  Food Insecurity:   . Worried About Charity fundraiser in the Last Year: Not on file  .  Ran Out of Food in the Last Year: Not on file  Transportation Needs:   . Lack of Transportation (Medical): Not on file  . Lack of Transportation (Non-Medical): Not on file  Physical Activity:   . Days of Exercise per Week: Not on file  . Minutes of Exercise per Session: Not on file  Stress:   . Feeling of Stress : Not on file  Social Connections:   . Frequency of Communication with Friends and Family: Not on file  . Frequency of Social Gatherings with Friends and Family: Not on file  . Attends Religious Services: Not on file  . Active Member of Clubs or Organizations: Not on file  . Attends Archivist Meetings: Not on file  . Marital Status: Not on file   Past Surgical History:  Procedure Laterality Date  . BALLOON DILATION N/A 01/30/2018   Procedure: BALLOON DILATION;  Surgeon: Mauri Pole, MD;  Location: [facility] ENDOSCOPY;  Service: Endoscopy;   Laterality: N/A;  . COLONOSCOPY    . COLONOSCOPY WITH PROPOFOL N/A 11/29/2016   Procedure: COLONOSCOPY WITH PROPOFOL;  Surgeon: Manya Silvas, MD;  Location: [facility] ENDOSCOPY;  Service: Endoscopy;  Laterality: N/A;  . COLONOSCOPY WITH PROPOFOL N/A 08/22/2017   Procedure: COLONOSCOPY WITH PROPOFOL;  Surgeon: Jonathon Bellows, MD;  Location: [facility] ENDOSCOPY;  Service: Gastroenterology;  Laterality: N/A;  . ESOPHAGEAL MANOMETRY N/A 10/05/2017   Procedure: ESOPHAGEAL MANOMETRY (EM);  Surgeon: Jonathon Bellows, MD;  Location: [facility] ENDOSCOPY;  Service: Gastroenterology;  Laterality: N/A;  . ESOPHAGEAL MANOMETRY N/A 01/30/2018   Procedure: ESOPHAGEAL MANOMETRY (EM);  Surgeon: Mauri Pole, MD;  Location: [facility] ENDOSCOPY;  Service: Endoscopy;  Laterality: N/A;  . ESOPHAGOGASTRODUODENOSCOPY N/A 03/09/2018   Procedure: ESOPHAGOGASTRODUODENOSCOPY (EGD);  Surgeon: Milus Banister, MD;  Location: [facility];  Service: Endoscopy;  Laterality: N/A;  . ESOPHAGOGASTRODUODENOSCOPY (EGD) WITH PROPOFOL N/A 11/29/2016   Procedure: ESOPHAGOGASTRODUODENOSCOPY (EGD) WITH PROPOFOL;  Surgeon: Manya Silvas, MD;  Location: [facility] ENDOSCOPY;  Service: Endoscopy;  Laterality: N/A;  . ESOPHAGOGASTRODUODENOSCOPY (EGD) WITH PROPOFOL N/A 08/22/2017   Procedure: ESOPHAGOGASTRODUODENOSCOPY (EGD) WITH PROPOFOL;  Surgeon: Jonathon Bellows, MD;  Location: [facility] ENDOSCOPY;  Service: Gastroenterology;  Laterality: N/A;  . ESOPHAGOGASTRODUODENOSCOPY (EGD) WITH PROPOFOL N/A 01/30/2018   Procedure: ESOPHAGOGASTRODUODENOSCOPY (EGD) WITH PROPOFOL;  Surgeon: Mauri Pole, MD;  Location: [facility] ENDOSCOPY;  Service: Endoscopy;  Laterality: N/A;  Mano probe to be placed during EGD  . EUS N/A 03/09/2018   Procedure: UPPER ENDOSCOPIC ULTRASOUND (EUS) RADIAL;  Surgeon: Milus Banister, MD;  Location: [facility] ENDOSCOPY;  Service: Endoscopy;  Laterality: N/A;  . NECK SURGERY     status post diagnosis of cancer with chemo and radiation  . SUBMUCOSAL INJECTION   03/09/2018   Procedure: SUBMUCOSAL INJECTION;  Surgeon: Milus Banister, MD;  Location: [facility] ENDOS[facility];  Service: Endoscopy;;  . TONSILLECTOMY     Past Medical History:  Diagnosis Date  . Anxiety   . Arthritis   . Carcinoma metastatic to lymph node ([place])   . Cataract    right  . COPD (chronic obstructive pulmonary disease) ([place])   . Depression   . GERD (gastroesophageal reflux disease)   . Hoarseness of voice   . Personal history of tobacco use, presenting hazards to health February of 2001  . Thyroid disease    There were no vitals taken for this visit.  Opioid Risk Score:   Fall Risk Score:  `1  Depression screen PHQ 2/9  Depression screen [facility] 2/9 10/12/2019 09/07/2019  05/10/2019 04/09/2019 03/28/2019 02/16/2019 05/01/2018  Decreased Interest 1 0 3 3 0 0 0  Down, Depressed, Hopeless 0 0 1 2 0 0 0  PHQ - 2 Score 1 0 4 5 0 0 0  Altered sleeping 3 0 2 1 - - -  Tired, decreased energy 3 3 3 3  - - -  Change in appetite 0 0 0 1 - - -  Feeling bad or failure about yourself  0 0 3 3 - - -  Trouble concentrating 3 0 3 2 - - -  Moving slowly or fidgety/restless 1 0 0 2 - - -  Suicidal thoughts 0 0 1 2 - - -  PHQ-9 Score 11 3 16 19  - - -  Difficult doing work/chores Somewhat difficult Not difficult at all Somewhat difficult Extremely dIfficult - - -    Review of Systems  Neurological: Positive for weakness.  All other systems reviewed and are negative.      Objective:   Physical Exam Gen: no distress, normal appearing HEENT: oral mucosa pink and moist, NCAT Cardio: Reg rate Chest: normal effort, normal rate of breathing Abd: soft, non-distended Ext: no edema Skin: intact Neuro: Alert Musculoskeletal: 5/5 throughout Psych: pleasant, normal affect     Assessment & Plan:  1) Post-COVID fatigue and brain fog -Physical therapy at [facility] ordered to improve energy, strength, and endurance -Discussed natural progression of disease -Increase methylphenidate to 15mg  daily, can  increase to 20mg  after a week if this is not helping.  -Continue to work as long as possible for mental stimulation -Showed picture of incentive spirometer and advised 10x per hour. He says he will buy this before next visit. -Continue walking dog 3 miles per day -Encouraged work as much as he can tolerate.  -Can consider stem cell patches  2) Vitamin D deficiency -Continue Vitamin D supplementation. Discussed low level.   3) Smoking cessation: Discussed current smoking habits, risk of lung cancer, prior smoking habits, medications/patches tried. Made goal to decrease # of cigarrettes from 20 to 19 per day.  4) COPD IS as above  5) Aspiration: Discussed chin tuck, slow eating, dysphagia foods types  All questions answered. RTC in 6 weeks

## 2019-11-15 ENCOUNTER — Other Ambulatory Visit: Payer: Self-pay | Admitting: Physical Medicine and Rehabilitation

## 2019-11-16 NOTE — Telephone Encounter (Signed)
Refill request for Vit D. Would you like patient to stay on Vit D? If so what is the quantity?

## 2019-11-25 NOTE — Chronic Care Management (AMB) (Incomplete)
Chronic Care Management Pharmacy  Name: Luke [name]  MRN: [id number] DOB: 11-02-1950   Chief Complaint/ HPI  Luke [name],  69 y.o. , male presents for their Initial CCM visit with the clinical pharmacist via telephone.  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 [name]- prednisone 50 mg qd, referral ENT- dysphagia, dysfunction eustachian tubes, h/o head/neck cancer  Consult Visit: 07/02/19- Dr. Delana Meyer, vascular- no surgery, lipid/htn mgmnt, smoking cessation, continue antiplatelet and lipid txy 06/04/19- Dr. Garen Lah, Cardiology- ct angiography, 4.1 cm ascending aortic aneurysm, annual imaging, aortic athersclerosis  No Known Allergies  Medications: Outpatient Encounter Medications as of 11/26/2019  Medication Sig  . albuterol (PROVENTIL) (2.5 MG/3ML) 0.083% nebulizer solution Take 3 mLs (2.5 mg total) by nebulization every 2 (two) hours as needed for wheezing. Dx: copd exacerbation J44.1  . albuterol (VENTOLIN HFA) 108 (90 Base) MCG/ACT inhaler Inhale 2 puffs into the lungs every 4 (four) hours as needed for wheezing or shortness of breath.  Marland Kitchen aspirin EC 81 MG tablet Take 1 tablet (81 mg total) by mouth daily. Swallow whole.  Marland Kitchen atorvastatin (LIPITOR) 40 MG tablet Take 1 tablet (40 mg total) by mouth daily.  . fluticasone (FLONASE) 50 MCG/ACT nasal spray Place 2 sprays into both nostrils daily. Use for 4-6 weeks then stop and use seasonally or as needed.  . Ipratropium-Albuterol (COMBIVENT) 20-100 MCG/ACT AERS respimat Inhale 1 puff into the lungs every 6 (six) hours as needed for wheezing.  Marland Kitchen levothyroxine (SYNTHROID)  88 MCG tablet Take 1 tablet (88 mcg total) by mouth daily before breakfast.  . meloxicam (MOBIC) 15 MG tablet TAKE 1 TABLET(15 MG) BY MOUTH DAILY  . methylphenidate (RITALIN) 5 MG tablet Take 1 tablet (5 mg total) by mouth 3 (three) times daily as needed.  . modafinil (PROVIGIL) 100 MG tablet Take 1 tablet (100 mg total) by mouth daily.  . Multiple Vitamin (MULTIVITAMIN WITH MINERALS) TABS tablet Take 1 tablet by mouth daily. One-A-Day Multivitamin  . nicotine (NICODERM CQ) 21 mg/24hr patch Place 1 patch (21 mg total) onto the skin daily.  . nicotine polacrilex (NICORETTE) 4 MG gum Take 1 each (4 mg total) by mouth as needed for smoking cessation.  Marland Kitchen omeprazole (PRILOSEC) 20 MG capsule Take 1 capsule (20 mg total) by mouth daily before breakfast.  . Tiotropium Bromide-Olodaterol (STIOLTO RESPIMAT) 2.5-2.5 MCG/ACT AERS Inhale 2 puffs into the lungs daily.  Marland Kitchen umeclidinium-vilanterol (ANORO ELLIPTA) 62.5-25 MCG/INH AEPB Inhale 1 puff into the lungs daily.  . Vitamin D, Ergocalciferol, (DRISDOL) 1.25 MG (50000 UNIT) CAPS capsule Take 1 capsule (50,000 Units total) by mouth every 7 (seven) days.  . vitamin E (VITAMIN E) 180 MG (400 UNITS) capsule Take 400 Units by mouth daily.  Marland Kitchen zinc sulfate 220 (50 Zn) MG capsule Take 220 mg by mouth daily.   No facility-administered encounter medications on file as of 11/26/2019.    Wt Readings from Last 3 Encounters:  11/13/19 187 lb (84.8 kg)  10/12/19 184 lb (83.5 kg)  10/05/19 184 lb (83.5 kg)    Current Diagnosis/Assessment:    Goals Addressed   None      COPD   Last spirometry score: 05/14/19  Gold Grade: Gold 2 (FEV1 50-79%) Current COPD Classification:  {CHL HP Upstream Pharm COPD Classification:8325926838}  Lab Results  Component Value Date/Time   EOSPCT 1.1 02/09/2019 10:03 AM   EOSPCT 3.1 08/20/2013 03:04 PM   EOSABS 0.1 09/07/2019 03:35 PM   EOSABS 0.2 08/20/2013 03:04 PM    Patient has failed these meds in past: *** Patient  is currently {CHL Controlled/Uncontrolled:[phone]} on the following medications:  . Stiolto 2 puffs daily . Albuterol 2 puffs every 4 hours as needed  Using maintenance inhaler regularly? Yes Frequency of rescue inhaler use:  1-2x per week  We discussed:  diet and exercise extensively, smoking cessation and proper inhaler technique. Patient is followed by Pulmonology  Plan  Continue {CHL HP Upstream Pharmacy BBCWU:[id number]}   Tobacco Abuse   Tobacco Status:  Social History   Tobacco Use  Smoking Status Current Every Day Smoker  . Packs/day: 0.50  . Years: 51.00  . Pack years: 25.50  . Types: Cigarettes  . Start date: 1970  Smokeless Tobacco Former User  Tobacco Comment   05/03/19- down to half a pack or less / day    Patient smokes {Time to first cigarette:23873} Patient triggers include: {Smoking Triggers:23882} On a scale of 1-10, reports MOTIVATION to quit is *** On a scale of 1-10, reports CONFIDENCE in quitting is ***  Previous quit attempts included: *** Patient is currently {CHL Controlled/Uncontrolled:[phone]} on the following medications:  . ***  We discussed:  {Smoking Cessation Counseling:23883}  Plan  Continue {CHL HP Upstream Pharmacy Plans:[phone]}   Hypothyroidism   Lab Results  Component Value Date/Time   TSH 1.570 09/07/2019 03:35 PM   TSH 3.00 02/09/2019 10:03 AM   FREET4 1.2 02/09/2019 10:03 AM   FREET4 1.2 08/26/2017 08:11 AM    Patient has failed these meds in past: *** Patient is currently {CHL Controlled/Uncontrolled:[phone]} on the following medications:  . ***  We discussed:  {CHL HP Upstream Pharmacy discussion:[phone]}  Plan  Continue {CHL HP Upstream Pharmacy Plans:[phone]}  Diabetes   A1c goal {A1c goals:23924}  Recent Relevant Labs: Lab Results  Component Value Date/Time   HGBA1C 5.9 09/07/2019 03:32 PM   HGBA1C 6.0 (H) 02/09/2019 10:03 AM   HGBA1C 6.1 (H) 04/28/2018 08:12 AM    Last diabetic  Eye exam: No results found for: HMDIABEYEEXA  Last diabetic Foot exam: No results found for: HMDIABFOOTEX   Checking BG: {CHL HP Blood Glucose Monitoring Frequency:[phone]}  Recent FBG Readings: *** Recent pre-meal BG readings: *** Recent 2hr PP BG readings:  *** Recent HS BG readings: ***  Patient has failed these meds in past: *** Patient is currently {CHL Controlled/Uncontrolled:[phone]} on the following medications: . ***  We discussed: {CHL HP Upstream Pharmacy discussion:[phone]}  Plan  Continue control with diet and exercise   Hyperlipidemia   LDL goal < ***  Last lipids Lab Results  Component Value Date   CHOL 186 02/09/2019   HDL 38 (L) 02/09/2019   LDLCALC 131 (H) 02/09/2019   TRIG 75 02/09/2019   CHOLHDL 4.9 02/09/2019   Hepatic Function Latest Ref Rng & Units 09/07/2019 02/09/2019 08/26/2017  Total Protein 6.0 - 8.5 g/dL 6.9 6.8 7.0  Albumin 3.8 - 4.8 g/dL 4.5 - -  AST 0 - 40 IU/L 21 20 20   ALT 0 - 44 IU/L 12 13 10   Alk Phosphatase 48 - 121 IU/L 104 - -  Total Bilirubin 0.0 - 1.2 mg/dL 0.4 0.7 0.5     The 10-year ASCVD risk score (  Renaye Rakers., et al., 2013) is: 12.8%   Values used to calculate the score:     Age: 69 years     Sex: Male     Is Non-Hispanic African American: No     Diabetic: No     Tobacco smoker: Yes     Systolic Blood Pressure: 94 mmHg     Is BP treated: No     HDL Cholesterol: 38 mg/dL     Total Cholesterol: 186 mg/dL   Patient has failed these meds in past: *** Patient is currently {CHL Controlled/Uncontrolled:[phone]} on the following medications:  . ***  We discussed:  {CHL HP Upstream Pharmacy [phone]}  Plan  Continue {CHL HP Upstream Pharmacy Plans:[phone]}   Post- viral fatigue    Patient has failed these meds in past: *** Patient is currently {CHL Controlled/Uncontrolled:[phone]} on the following medications:  Marland Kitchen Methylphenidate 15 mg qd  We discussed:  ***  Plan  Continue {CHL  HP Upstream Pharmacy JFHLK:[id number]}  Vaccines   Reviewed and discussed patient's vaccination history.    Immunization History  Administered Date(s) Administered  . Fluad Quad(high Dose 65+) 10/05/2019  . Influenza Inj Mdck Quad With Preservative 10/21/2016  . Influenza,inj,Quad PF,6+ Mos 10/09/2015  . Influenza-Unspecified 11/04/2017  . Pneumococcal Conjugate-13 02/16/2019  . Pneumococcal Polysaccharide-23 07/02/2017  . Zoster 09/05/2013    Plan  Recommended patient receive *** vaccine in *** office.   Medication Management   Pt uses *** pharmacy for all medications Uses pill box? {Yes or If no, why not?:20788} Pt endorses ***% compliance  We discussed: {Pharmacy options:24294}  Plan  {US Pharmacy DSKA:76811}    Follow up: *** month phone visit  ***

## 2019-11-26 ENCOUNTER — Ambulatory Visit: Payer: Medicare HMO | Admitting: Pharmacist

## 2019-11-28 DIAGNOSIS — H401131 Primary open-angle glaucoma, bilateral, mild stage: Secondary | ICD-10-CM | POA: Diagnosis not present

## 2019-11-29 DIAGNOSIS — J449 Chronic obstructive pulmonary disease, unspecified: Secondary | ICD-10-CM | POA: Diagnosis not present

## 2019-12-03 ENCOUNTER — Encounter: Payer: Self-pay | Admitting: Family Medicine

## 2019-12-03 ENCOUNTER — Telehealth (INDEPENDENT_AMBULATORY_CARE_PROVIDER_SITE_OTHER): Payer: Medicare HMO | Admitting: Family Medicine

## 2019-12-03 VITALS — BP 138/103 | HR 90 | Wt 185.0 lb

## 2019-12-03 DIAGNOSIS — H9202 Otalgia, left ear: Secondary | ICD-10-CM

## 2019-12-03 DIAGNOSIS — J029 Acute pharyngitis, unspecified: Secondary | ICD-10-CM

## 2019-12-03 MED ORDER — FLUTICASONE PROPIONATE 50 MCG/ACT NA SUSP: 2.0000 | Freq: Every day | NASAL | 3 refills | Status: DC

## 2019-12-03 NOTE — Addendum Note (Signed)
Addended by: Georgina Peer on: 12/03/2019 03:30 PM   Modules accepted: Orders

## 2019-12-03 NOTE — Progress Notes (Signed)
BP (!) 138/103   Pulse 90   Wt 185 lb (83.9 kg)   SpO2 97%   BMI 24.41 kg/m    Subjective:    Patient ID: Luke [name], male    DOB: 09/20/1950, 69 y.o.   MRN: [id number]  HPI: Luke [name] is a 69 y.o. male  Chief Complaint  Patient presents with  . Sore Throat    pt states sore throat since he last saw [place] about a month ago has not cleared up   . Fatigue    pt states he has fatigue that has been getting worse over the past two weeks    Started with chills for about 2 nights straight last week. Has been having some sore throat and L ear is hurting. He notes that he continues to be very tired. He has had a lot of congestion and runny nose. He notes that he has not been feeling well. His ear and throat have been bothering him since his last visit 2 months ago. He has not heard from ENT yet. He notes that the chills on top of that made him anxious.  EAR PAIN Duration: 2+ months Involved ear(s): left Severity:  6/10  Quality:  Sharp pain Fever: unknown Otorrhea: no Upper respiratory infection symptoms: yes Pruritus: yes Hearing loss: no Water immersion no Using Q-tips: yes Recurrent otitis media: no Status: stable Treatments attempted: prednisone, nasal saline   Relevant past medical, surgical, family and social history reviewed and updated as indicated. Interim medical history since our last visit reviewed. Allergies and medications reviewed and updated.  Review of Systems  Constitutional: Positive for chills, diaphoresis and fatigue. Negative for activity change, appetite change, fever and unexpected weight change.  HENT: Positive for congestion, ear pain, rhinorrhea and sinus pressure. Negative for dental problem, drooling, ear discharge, facial swelling, hearing loss, mouth sores, nosebleeds, postnasal drip, sinus pain, sneezing, sore throat, tinnitus, trouble swallowing and voice change.   Respiratory: Negative.   Cardiovascular: Negative.   Gastrointestinal:  Negative.   Psychiatric/Behavioral: Negative.     Per HPI unless specifically indicated above     Objective:    BP (!) 138/103   Pulse 90   Wt 185 lb (83.9 kg)   SpO2 97%   BMI 24.41 kg/m   Wt Readings from Last 3 Encounters:  12/03/19 185 lb (83.9 kg)  11/13/19 187 lb (84.8 kg)  10/12/19 184 lb (83.5 kg)    Physical Exam Vitals and nursing note reviewed.  Constitutional:      General: He is not in acute distress.    Appearance: Normal appearance. He is not ill-appearing, toxic-appearing or diaphoretic.  HENT:     Head: Normocephalic and atraumatic.     Right Ear: External ear normal.     Left Ear: External ear normal.     Nose: Nose normal.     Mouth/Throat:     Mouth: Mucous membranes are moist.     Pharynx: Oropharynx is clear.  Eyes:     General: No scleral icterus.       Right eye: No discharge.        Left eye: No discharge.     Conjunctiva/sclera: Conjunctivae normal.     Pupils: Pupils are equal, round, and reactive to light.  Pulmonary:     Effort: Pulmonary effort is normal. No respiratory distress.     Comments: Speaking in full sentences Musculoskeletal:        General: Normal range of motion.  Cervical back: Normal range of motion.  Skin:    Coloration: Skin is not jaundiced or pale.     Findings: No bruising, erythema, lesion or rash.  Neurological:     Mental Status: He is alert and oriented to person, place, and time. Mental status is at baseline.  Psychiatric:        Mood and Affect: Mood normal.        Behavior: Behavior normal.        Thought Content: Thought content normal.        Judgment: Judgment normal.     Results for orders placed or performed in visit on 10/12/19  Vitamin D (25 hydroxy)  Result Value Ref Range   Vit D, 25-Hydroxy 60.4 30.0 - 100.0 ng/mL      Assessment & Plan:   Problem List Items Addressed This Visit    None    Visit Diagnoses    Sore throat    -  Primary   Will get him in for COVID swab-  self-quarantine until results back. Start flonase. If negative, will get him in for in-person exam.    Relevant Orders   Novel Coronavirus, NAA ([facility])   Left ear pain       Given chronicity concern for ETD. If COVID negative- will get him in for evaluation of ear ASAP. Has not heard from ENT- will check on referral. Start Flonase.        Follow up plan: Return Pending COVID test- in person in couple of days for ear exam.   . This visit was completed via MyChart due to the restrictions of the COVID-19 pandemic. All issues as above were discussed and addressed. Physical exam was done as above through visual confirmation on MyChart. If it was felt that the patient should be evaluated in the office, they were directed there. The patient verbally consented to this visit. . Location of the patient: home . Location of the provider: work . Those involved with this call:  . Provider: Park Liter, DO . CMA: Louanna Raw, [place] . Front Desk/Registration: Jill Side  . Time spent on call: 15 minutes with patient face to face via video conference. More than 50% of this time was spent in counseling and coordination of care. 23 minutes total spent in review of patient's record and preparation of their chart.

## 2019-12-04 LAB — SARS-COV-2, NAA 2 DAY TAT

## 2019-12-04 LAB — NOVEL CORONAVIRUS, NAA: SARS-CoV-2, NAA: NOT DETECTED

## 2019-12-10 DIAGNOSIS — J3801 Paralysis of vocal cords and larynx, unilateral: Secondary | ICD-10-CM | POA: Diagnosis not present

## 2019-12-10 DIAGNOSIS — R1314 Dysphagia, pharyngoesophageal phase: Secondary | ICD-10-CM | POA: Diagnosis not present

## 2019-12-10 DIAGNOSIS — J387 Other diseases of larynx: Secondary | ICD-10-CM | POA: Diagnosis not present

## 2019-12-10 DIAGNOSIS — C329 Malignant neoplasm of larynx, unspecified: Secondary | ICD-10-CM | POA: Diagnosis not present

## 2019-12-12 DIAGNOSIS — J3801 Paralysis of vocal cords and larynx, unilateral: Secondary | ICD-10-CM | POA: Diagnosis not present

## 2019-12-12 DIAGNOSIS — R1314 Dysphagia, pharyngoesophageal phase: Secondary | ICD-10-CM | POA: Diagnosis not present

## 2019-12-12 DIAGNOSIS — J387 Other diseases of larynx: Secondary | ICD-10-CM | POA: Diagnosis not present

## 2019-12-14 ENCOUNTER — Ambulatory Visit (INDEPENDENT_AMBULATORY_CARE_PROVIDER_SITE_OTHER): Payer: Medicare HMO

## 2019-12-14 VITALS — Ht 73.0 in | Wt 185.0 lb

## 2019-12-14 DIAGNOSIS — Z0000 Encounter for general adult medical examination without abnormal findings: Secondary | ICD-10-CM | POA: Diagnosis not present

## 2019-12-14 NOTE — Progress Notes (Signed)
I connected with Luke [name] today by telephone and verified that I am speaking with the correct person using two identifiers. Location patient: home Location provider: work Persons participating in the virtual visit: Luke [name], Luke Durand LPN.   I discussed the limitations, risks, security and privacy concerns of performing an evaluation and management service by telephone and the availability of in person appointments. I also discussed with the patient that there may be a patient responsible charge related to this service. The patient expressed understanding and verbally consented to this telephonic visit.    Interactive audio and video telecommunications were attempted between this provider and patient, however failed, due to patient having technical difficulties OR patient did not have access to video capability.  We continued and completed visit with audio only.     Vital signs may be patient reported or missing.  Subjective:   Luke [name] is a 69 y.o. male who presents for an Initial Medicare Annual Wellness Visit.  Review of Systems     Cardiac Risk Factors include: advanced age (>39men, >82 women);male gender;smoking/ tobacco exposure     Objective:    Today's Vitals   12/14/19 1114 12/14/19 1115  Weight: 185 lb (83.9 kg)   Height: 6\' 1"  (1.854 m)   PainSc:  5    Body mass index is 24.41 kg/m.  Advanced Directives 12/14/2019 03/21/2019 03/09/2018 01/30/2018 10/17/2017 08/22/2017 07/01/2017  Does Patient Have a Medical Advance Directive? No No No No No No No  Would patient like information on creating a medical advance directive? - No - Patient declined No - Patient declined No - Patient declined - No - Patient declined Yes (Inpatient - patient defers creating a medical advance directive at this time)    Current Medications (verified) Outpatient Encounter Medications as of 12/14/2019  Medication Sig  . albuterol (PROVENTIL) (2.5 MG/3ML) 0.083% nebulizer solution  Take 3 mLs (2.5 mg total) by nebulization every 2 (two) hours as needed for wheezing. Dx: copd exacerbation J44.1  . albuterol (VENTOLIN HFA) 108 (90 Base) MCG/ACT inhaler Inhale 2 puffs into the lungs every 4 (four) hours as needed for wheezing or shortness of breath.  Marland Kitchen aspirin EC 81 MG tablet Take 1 tablet (81 mg total) by mouth daily. Swallow whole.  . fluticasone (FLONASE) 50 MCG/ACT nasal spray Place 2 sprays into both nostrils daily. Use for 4-6 weeks then stop and use seasonally or as needed.  . Ipratropium-Albuterol (COMBIVENT) 20-100 MCG/ACT AERS respimat Inhale 1 puff into the lungs every 6 (six) hours as needed for wheezing.  Marland Kitchen levothyroxine (SYNTHROID) 88 MCG tablet Take 1 tablet (88 mcg total) by mouth daily before breakfast.  . meloxicam (MOBIC) 15 MG tablet TAKE 1 TABLET(15 MG) BY MOUTH DAILY  . methylphenidate (RITALIN) 5 MG tablet Take 1 tablet (5 mg total) by mouth 3 (three) times daily as needed.  . Multiple Vitamin (MULTIVITAMIN WITH MINERALS) TABS tablet Take 1 tablet by mouth daily. One-A-Day Multivitamin  . omeprazole (PRILOSEC) 20 MG capsule Take 1 capsule (20 mg total) by mouth daily before breakfast.  . Tiotropium Bromide-Olodaterol (STIOLTO RESPIMAT) 2.5-2.5 MCG/ACT AERS Inhale 2 puffs into the lungs daily.  Marland Kitchen umeclidinium-vilanterol (ANORO ELLIPTA) 62.5-25 MCG/INH AEPB Inhale 1 puff into the lungs daily.  . vitamin E (VITAMIN E) 180 MG (400 UNITS) capsule Take 400 Units by mouth daily.  Marland Kitchen zinc sulfate 220 (50 Zn) MG capsule Take 220 mg by mouth daily.  Marland Kitchen atorvastatin (LIPITOR) 40 MG tablet Take 1 tablet (40  mg total) by mouth daily.  . modafinil (PROVIGIL) 100 MG tablet Take 1 tablet (100 mg total) by mouth daily. (Patient not taking: Reported on 11/26/2019)  . nicotine (NICODERM CQ) 21 mg/24hr patch Place 1 patch (21 mg total) onto the skin daily. (Patient not taking: Reported on 11/26/2019)  . nicotine polacrilex (NICORETTE) 4 MG gum Take 1 each (4 mg total) by mouth  as needed for smoking cessation. (Patient not taking: Reported on 11/26/2019)  . Vitamin D, Ergocalciferol, (DRISDOL) 1.25 MG (50000 UNIT) CAPS capsule Take 1 capsule (50,000 Units total) by mouth every 7 (seven) days. (Patient not taking: Reported on 12/03/2019)   No facility-administered encounter medications on file as of 12/14/2019.    Allergies (verified) Patient has no known allergies.   History: Past Medical History:  Diagnosis Date  . Anxiety   . Arthritis   . Carcinoma metastatic to lymph node ([place])   . Cataract    right  . COPD (chronic obstructive pulmonary disease) ([place])   . Depression   . GERD (gastroesophageal reflux disease)   . Hoarseness of voice   . Personal history of tobacco use, presenting hazards to health February of 2001  . Thyroid disease    Past Surgical History:  Procedure Laterality Date  . BALLOON DILATION N/A 01/30/2018   Procedure: BALLOON DILATION;  Surgeon: Mauri Pole, MD;  Location: [facility] ENDOSCOPY;  Service: Endoscopy;  Laterality: N/A;  . COLONOSCOPY    . COLONOSCOPY WITH PROPOFOL N/A 11/29/2016   Procedure: COLONOSCOPY WITH PROPOFOL;  Surgeon: Manya Silvas, MD;  Location: [facility] ENDOSCOPY;  Service: Endoscopy;  Laterality: N/A;  . COLONOSCOPY WITH PROPOFOL N/A 08/22/2017   Procedure: COLONOSCOPY WITH PROPOFOL;  Surgeon: Jonathon Bellows, MD;  Location: [facility] ENDOSCOPY;  Service: Gastroenterology;  Laterality: N/A;  . ESOPHAGEAL MANOMETRY N/A 10/05/2017   Procedure: ESOPHAGEAL MANOMETRY (EM);  Surgeon: Jonathon Bellows, MD;  Location: [facility] ENDOSCOPY;  Service: Gastroenterology;  Laterality: N/A;  . ESOPHAGEAL MANOMETRY N/A 01/30/2018   Procedure: ESOPHAGEAL MANOMETRY (EM);  Surgeon: Mauri Pole, MD;  Location: [facility] ENDOSCOPY;  Service: Endoscopy;  Laterality: N/A;  . ESOPHAGOGASTRODUODENOSCOPY N/A 03/09/2018   Procedure: ESOPHAGOGASTRODUODENOSCOPY (EGD);  Surgeon: Milus Banister, MD;  Location: [facility] ENDOS[facility];  Service: Endoscopy;  Laterality:  N/A;  . ESOPHAGOGASTRODUODENOSCOPY (EGD) WITH PROPOFOL N/A 11/29/2016   Procedure: ESOPHAGOGASTRODUODENOSCOPY (EGD) WITH PROPOFOL;  Surgeon: Manya Silvas, MD;  Location: [facility] ENDOSCOPY;  Service: Endoscopy;  Laterality: N/A;  . ESOPHAGOGASTRODUODENOSCOPY (EGD) WITH PROPOFOL N/A 08/22/2017   Procedure: ESOPHAGOGASTRODUODENOSCOPY (EGD) WITH PROPOFOL;  Surgeon: Jonathon Bellows, MD;  Location: [facility] ENDOSCOPY;  Service: Gastroenterology;  Laterality: N/A;  . ESOPHAGOGASTRODUODENOSCOPY (EGD) WITH PROPOFOL N/A 01/30/2018   Procedure: ESOPHAGOGASTRODUODENOSCOPY (EGD) WITH PROPOFOL;  Surgeon: Mauri Pole, MD;  Location: [facility] ENDOSCOPY;  Service: Endoscopy;  Laterality: N/A;  Mano probe to be placed during EGD  . EUS N/A 03/09/2018   Procedure: UPPER ENDOSCOPIC ULTRASOUND (EUS) RADIAL;  Surgeon: Milus Banister, MD;  Location: [facility] ENDOSCOPY;  Service: Endoscopy;  Laterality: N/A;  . NECK SURGERY     status post diagnosis of cancer with chemo and radiation  . SUBMUCOSAL INJECTION  03/09/2018   Procedure: SUBMUCOSAL INJECTION;  Surgeon: Milus Banister, MD;  Location: [facility] ENDOSCOPY;  Service: Endoscopy;;  . TONSILLECTOMY     Family History  Problem Relation Age of Onset  . Breast cancer Mother   . Heart disease Mother   . Cancer Mother        breast, back, bladder  .  Heart attack Father   . Heart disease Father    Social History   Socioeconomic History  . Marital status: Single    Spouse name: Not on file  . Number of children: 1  . Years of education: [employer]  . Highest education level: High school graduate  Occupational History  . Not on file  Tobacco Use  . Smoking status: Current Every Day Smoker    Packs/day: 1.00    Years: 51.00    Pack years: 51.00    Types: Cigarettes    Start date: 54  . Smokeless tobacco: Former Systems developer  . Tobacco comment: 05/03/19- down to half a pack or less / day  Vaping Use  . Vaping Use: Never used  Substance and Sexual Activity  . Alcohol use:  No  . Drug use: No  . Sexual activity: Not on file  Other Topics Concern  . Not on file  Social History Narrative  . Not on file   Social Determinants of Health   Financial Resource Strain: Low Risk   . Difficulty of Paying Living Expenses: Not hard at all  Food Insecurity: No Food Insecurity  . Worried About Charity fundraiser in the Last Year: Never true  . Ran Out of Food in the Last Year: Never true  Transportation Needs: No Transportation Needs  . Lack of Transportation (Medical): No  . Lack of Transportation (Non-Medical): No  Physical Activity: Sufficiently Active  . Days of Exercise per Week: 7 days  . Minutes of Exercise per Session: 30 min  Stress: No Stress Concern Present  . Feeling of Stress : Not at all  Social Connections:   . Frequency of Communication with Friends and Family: Not on file  . Frequency of Social Gatherings with Friends and Family: Not on file  . Attends Religious Services: Not on file  . Active Member of Clubs or Organizations: Not on file  . Attends Archivist Meetings: Not on file  . Marital Status: Not on file    Tobacco Counseling Ready to quit: No Counseling given: Not Answered Comment: 05/03/19- down to half a pack or less / day   Clinical Intake:  Pre-visit preparation completed: Yes  Pain : 0-10 Pain Score: 5  Pain Type: Acute pain Pain Location: Throat Pain Descriptors / Indicators: Sore Pain Onset: More than a month ago Pain Frequency: Intermittent Pain Relieving Factors: salt water gargles help  Pain Relieving Factors: salt water gargles help  Nutritional Status: BMI of 19-24  Normal Nutritional Risks: None Diabetes: No  How often do you need to have someone help you when you read instructions, pamphlets, or other written materials from your doctor or pharmacy?: 1 - Never What is the last grade level you completed in school?: GED  Diabetic? no  Interpreter Needed?: No  Information entered by :: NAllen  LPN   Activities of Daily Living In your present state of health, do you have any difficulty performing the following activities: 12/14/2019 02/16/2019  Hearing? Y N  Vision? N N  Difficulty concentrating or making decisions? Y N  Walking or climbing stairs? Y N  Dressing or bathing? N N  Doing errands, shopping? N N  Preparing Food and eating ? N -  Using the Toilet? N -  In the past six months, have you accidently leaked urine? Y -  Do you have problems with loss of bowel control? N -  Managing your Medications? N -  Managing your Finances?  N -  Housekeeping or managing your Housekeeping? N -  Some recent data might be hidden    Patient Care Team: Valerie Roys, DO as PCP - General (Family Medicine) Kate Sable, MD as PCP - Cardiology (Cardiology) Vladimir Faster, [facility] (Pharmacist)  Indicate any recent Medical Services you may have received from other than [facility] providers in the past year (date may be approximate).     Assessment:   This is a routine wellness examination for [place].  Hearing/Vision screen  Hearing Screening   125Hz  250Hz  500Hz  1000Hz  2000Hz  3000Hz  4000Hz  6000Hz  8000Hz   Right ear:           Left ear:           Vision Screening Comments: Regular eye exams, no set practice  Dietary issues and exercise activities discussed: Current Exercise Habits: Home exercise routine, Type of exercise: walking, Time (Minutes): 30, Frequency (Times/Week): 7, Weekly Exercise (Minutes/Week): 210  Goals    . Patient Stated     12/14/2019, no goals    . Pharmacy Care Plan      Depression Screen [facility] 2/9 Scores 12/14/2019 10/12/2019 09/07/2019 05/10/2019 04/09/2019 03/28/2019 02/16/2019  PHQ - 2 Score 0 1 0 4 5 0 0  PHQ- 9 Score - 11 3 16 19  - -  Exception Documentation - - - - - - -    Fall Risk Fall Risk  12/14/2019 11/13/2019 10/12/2019 03/28/2019 02/16/2019  Falls in the past year? 0 0 0 0 0  Number falls in past yr: - - - 0 0  Injury with Fall? - - - 0 0  Risk for fall  due to : Medication side effect - - - -  Follow up Falls evaluation completed;Education provided;Falls prevention discussed - - Falls evaluation completed Falls evaluation completed    Any stairs in or around the home? No  If so, are there any without handrails? n/a Home free of loose throw rugs in walkways, pet beds, electrical cords, etc? Yes  Adequate lighting in your home to reduce risk of falls? Yes   ASSISTIVE DEVICES UTILIZED TO PREVENT FALLS:  Life alert? No  Use of a cane, walker or w/c? No  Grab bars in the bathroom? No  Shower chair or bench in shower? No  Elevated toilet seat or a handicapped toilet? No   TIMED UP AND GO:  Was the test performed? No . .   Cognitive Function:     6CIT Screen 12/14/2019  What Year? 0 points  What month? 0 points  What time? 0 points  Count back from 20 2 points  Months in reverse 4 points  Repeat phrase 4 points  Total Score 10    Immunizations Immunization History  Administered Date(s) Administered  . Fluad Quad(high Dose 65+) 10/05/2019  . Influenza Inj Mdck Quad With Preservative 10/21/2016  . Influenza,inj,Quad PF,6+ Mos 10/09/2015  . Influenza-Unspecified 11/04/2017  . Pneumococcal Conjugate-13 02/16/2019  . Pneumococcal Polysaccharide-23 07/02/2017  . Zoster 09/05/2013    TDAP status: Up to date Flu Vaccine status: Up to date Pneumococcal vaccine status: Up to date Covid-19 vaccine status: had just one dose  Qualifies for Shingles Vaccine? Yes   Zostavax completed Yes   Shingrix Completed?: No.    Education has been provided regarding the importance of this vaccine. Patient has been advised to call insurance company to determine out of pocket expense if they have not yet received this vaccine. Advised may also receive vaccine at local pharmacy or  Health Dept. Verbalized acceptance and understanding.  Screening Tests Health Maintenance  Topic Date Due  . COVID-19 Vaccine (1) 12/19/2019 (Originally 08/07/1962)  .  PNA vac Low Risk Adult (2 of 2 - PPSV23) 07/03/2022  . TETANUS/TDAP  07/12/2022  . COLONOSCOPY  08/23/2022  . INFLUENZA VACCINE  Completed  . Hepatitis C Screening  Completed    Health Maintenance  There are no preventive care reminders to display for this patient.  Colorectal cancer screening: Completed 08/22/2017. Repeat every 5 years  Lung Cancer Screening: (Low Dose CT Chest recommended if Age 13-80 years, 30 pack-year currently smoking OR have quit w/in 15years.) does qualify.   Lung Cancer Screening Referral: completed CT scan on Monday [facility]  Additional Screening:  Hepatitis C Screening: does qualify; Completed 08/26/2017  Vision Screening: Recommended annual ophthalmology exams for early detection of glaucoma and other disorders of the eye. Is the patient up to date with their annual eye exam?  Yes  Who is the provider or what is the name of the office in which the patient attends annual eye exams? No set practice If pt is not established with a provider, would they like to be referred to a provider to establish care? No .   Dental Screening: Recommended annual dental exams for proper oral hygiene  Community Resource Referral / Chronic Care Management: CRR required this visit?  No   CCM required this visit?  No      Plan:     I have personally reviewed and noted the following in the patient's chart:   . Medical and social history . Use of alcohol, tobacco or illicit drugs  . Current medications and supplements . Functional ability and status . Nutritional status . Physical activity . Advanced directives . List of other physicians . Hospitalizations, surgeries, and ER visits in previous 12 months . Vitals . Screenings to include cognitive, depression, and falls . Referrals and appointments  In addition, I have reviewed and discussed with patient certain preventive protocols, quality metrics, and best practice recommendations. A written  personalized care plan for preventive services as well as general preventive health recommendations were provided to patient.     Kellie Simmering, LPN   [id number]   Nurse Notes:

## 2019-12-14 NOTE — Patient Instructions (Signed)
[name] , Thank you for taking time to come for your Medicare Wellness Visit. I appreciate your ongoing commitment to your health goals. Please review the following plan we discussed and let me know if I can assist you in the future.   Screening recommendations/referrals: Colonoscopy: completed 08/22/2017, due 08/23/2022 Recommended yearly ophthalmology/optometry visit for glaucoma screening and checkup Recommended yearly dental visit for hygiene and checkup  Vaccinations: Influenza vaccine: completed 10/05/2019, due 08/11/2020 Pneumococcal vaccine: completed 02/16/2019 Tdap vaccine: completed 07/11/2012, due 07/12/2022 Shingles vaccine: discussed   Covid-19:  05/30/2019, only got one dose  Advanced directives: Advance directive discussed with you today. Even though you declined this today please call our office should you change your mind and we can give you the proper paperwork for you to fill out.   Conditions/risks identified: smoking  Next appointment: Follow up in one year for your annual wellness visit.   Preventive Care 27 Years and Older, Male Preventive care refers to lifestyle choices and visits with your health care provider that can promote health and wellness. What does preventive care include?  A yearly physical exam. This is also called an annual well check.  Dental exams once or twice a year.  Routine eye exams. Ask your health care provider how often you should have your eyes checked.  Personal lifestyle choices, including:  Daily care of your teeth and gums.  Regular physical activity.  Eating a healthy diet.  Avoiding tobacco and drug use.  Limiting alcohol use.  Practicing safe sex.  Taking low doses of aspirin every day.  Taking vitamin and mineral supplements as recommended by your health care provider. What happens during an annual well check? The services and screenings done by your health care provider during your annual well check will depend on  your age, overall health, lifestyle risk factors, and family history of disease. Counseling  Your health care provider may ask you questions about your:  Alcohol use.  Tobacco use.  Drug use.  Emotional well-being.  Home and relationship well-being.  Sexual activity.  Eating habits.  History of falls.  Memory and ability to understand (cognition).  Work and work Statistician. Screening  You may have the following tests or measurements:  Height, weight, and BMI.  Blood pressure.  Lipid and cholesterol levels. These may be checked every 5 years, or more frequently if you are over 56 years old.  Skin check.  Lung cancer screening. You may have this screening every year starting at age 72 if you have a 30-pack-year history of smoking and currently smoke or have quit within the past 15 years.  Fecal occult blood test (FOBT) of the stool. You may have this test every year starting at age 59.  Flexible sigmoidoscopy or colonoscopy. You may have a sigmoidoscopy every 5 years or a colonoscopy every 10 years starting at age 6.  Prostate cancer screening. Recommendations will vary depending on your family history and other risks.  Hepatitis C blood test.  Hepatitis B blood test.  Sexually transmitted disease (STD) testing.  Diabetes screening. This is done by checking your blood sugar (glucose) after you have not eaten for a while (fasting). You may have this done every 1-3 years.  Abdominal aortic aneurysm (AAA) screening. You may need this if you are a current or former smoker.  Osteoporosis. You may be screened starting at age 42 if you are at high risk. Talk with your health care provider about your test results, treatment options, and if necessary,  the need for more tests. Vaccines  Your health care provider may recommend certain vaccines, such as:  Influenza vaccine. This is recommended every year.  Tetanus, diphtheria, and acellular pertussis (Tdap, Td) vaccine.  You may need a Td booster every 10 years.  Zoster vaccine. You may need this after age 33.  Pneumococcal 13-valent conjugate (PCV13) vaccine. One dose is recommended after age 27.  Pneumococcal polysaccharide (PPSV23) vaccine. One dose is recommended after age 82. Talk to your health care provider about which screenings and vaccines you need and how often you need them. This information is not intended to replace advice given to you by your health care provider. Make sure you discuss any questions you have with your health care provider. Document Released: 01/24/2015 Document Revised: 09/17/2015 Document Reviewed: 10/29/2014 Elsevier Interactive Patient Education  2017 [place] Prevention in the Home Falls can cause injuries. They can happen to people of all ages. There are many things you can do to make your home safe and to help prevent falls. What can I do on the outside of my home?  Regularly fix the edges of walkways and driveways and fix any cracks.  Remove anything that might make you trip as you walk through a door, such as a raised step or threshold.  Trim any bushes or trees on the path to your home.  Use bright outdoor lighting.  Clear any walking paths of anything that might make someone trip, such as rocks or tools.  Regularly check to see if handrails are loose or broken. Make sure that both sides of any steps have handrails.  Any raised decks and porches should have guardrails on the edges.  Have any leaves, snow, or ice cleared regularly.  Use sand or salt on walking paths during winter.  Clean up any spills in your garage right away. This includes oil or grease spills. What can I do in the bathroom?  Use night lights.  Install grab bars by the toilet and in the tub and shower. Do not use towel bars as grab bars.  Use non-skid mats or decals in the tub or shower.  If you need to sit down in the shower, use a plastic, non-slip stool.  Keep the  floor dry. Clean up any water that spills on the floor as soon as it happens.  Remove soap buildup in the tub or shower regularly.  Attach bath mats securely with double-sided non-slip rug tape.  Do not have throw rugs and other things on the floor that can make you trip. What can I do in the bedroom?  Use night lights.  Make sure that you have a light by your bed that is easy to reach.  Do not use any sheets or blankets that are too big for your bed. They should not hang down onto the floor.  Have a firm chair that has side arms. You can use this for support while you get dressed.  Do not have throw rugs and other things on the floor that can make you trip. What can I do in the kitchen?  Clean up any spills right away.  Avoid walking on wet floors.  Keep items that you use a lot in easy-to-reach places.  If you need to reach something above you, use a strong step stool that has a grab bar.  Keep electrical cords out of the way.  Do not use floor polish or wax that makes floors slippery. If you must use  wax, use non-skid floor wax.  Do not have throw rugs and other things on the floor that can make you trip. What can I do with my stairs?  Do not leave any items on the stairs.  Make sure that there are handrails on both sides of the stairs and use them. Fix handrails that are broken or loose. Make sure that handrails are as long as the stairways.  Check any carpeting to make sure that it is firmly attached to the stairs. Fix any carpet that is loose or worn.  Avoid having throw rugs at the top or bottom of the stairs. If you do have throw rugs, attach them to the floor with carpet tape.  Make sure that you have a light switch at the top of the stairs and the bottom of the stairs. If you do not have them, ask someone to add them for you. What else can I do to help prevent falls?  Wear shoes that:  Do not have high heels.  Have rubber bottoms.  Are comfortable and fit  you well.  Are closed at the toe. Do not wear sandals.  If you use a stepladder:  Make sure that it is fully opened. Do not climb a closed stepladder.  Make sure that both sides of the stepladder are locked into place.  Ask someone to hold it for you, if possible.  Clearly mark and make sure that you can see:  Any grab bars or handrails.  First and last steps.  Where the edge of each step is.  Use tools that help you move around (mobility aids) if they are needed. These include:  Canes.  Walkers.  Scooters.  Crutches.  Turn on the lights when you go into a dark area. Replace any light bulbs as soon as they burn out.  Set up your furniture so you have a clear path. Avoid moving your furniture around.  If any of your floors are uneven, fix them.  If there are any pets around you, be aware of where they are.  Review your medicines with your doctor. Some medicines can make you feel dizzy. This can increase your chance of falling. Ask your doctor what other things that you can do to help prevent falls. This information is not intended to replace advice given to you by your health care provider. Make sure you discuss any questions you have with your health care provider. Document Released: 10/24/2008 Document Revised: 06/05/2015 Document Reviewed: 02/01/2014 Elsevier Interactive Patient Education  2017 [employer].

## 2019-12-20 DIAGNOSIS — Z85828 Personal history of other malignant neoplasm of skin: Secondary | ICD-10-CM | POA: Diagnosis not present

## 2019-12-20 DIAGNOSIS — Z923 Personal history of irradiation: Secondary | ICD-10-CM | POA: Diagnosis not present

## 2019-12-20 DIAGNOSIS — Z87891 Personal history of nicotine dependence: Secondary | ICD-10-CM | POA: Diagnosis not present

## 2019-12-20 DIAGNOSIS — I10 Essential (primary) hypertension: Secondary | ICD-10-CM | POA: Diagnosis not present

## 2019-12-20 DIAGNOSIS — J387 Other diseases of larynx: Secondary | ICD-10-CM | POA: Diagnosis not present

## 2019-12-20 DIAGNOSIS — C321 Malignant neoplasm of supraglottis: Secondary | ICD-10-CM | POA: Diagnosis not present

## 2019-12-25 ENCOUNTER — Other Ambulatory Visit: Payer: Self-pay

## 2019-12-25 ENCOUNTER — Encounter: Payer: Self-pay | Admitting: Physical Medicine and Rehabilitation

## 2019-12-25 ENCOUNTER — Encounter: Payer: Medicare HMO | Attending: Physical Medicine and Rehabilitation | Admitting: Physical Medicine and Rehabilitation

## 2019-12-25 VITALS — BP 99/71 | HR 98 | Temp 98.6°F | Ht 71.0 in | Wt 182.0 lb

## 2019-12-25 DIAGNOSIS — R5383 Other fatigue: Secondary | ICD-10-CM | POA: Insufficient documentation

## 2019-12-25 DIAGNOSIS — Z8616 Personal history of COVID-19: Secondary | ICD-10-CM

## 2019-12-25 DIAGNOSIS — U099 Post covid-19 condition, unspecified: Secondary | ICD-10-CM | POA: Insufficient documentation

## 2019-12-25 DIAGNOSIS — F17210 Nicotine dependence, cigarettes, uncomplicated: Secondary | ICD-10-CM | POA: Diagnosis not present

## 2019-12-25 DIAGNOSIS — J449 Chronic obstructive pulmonary disease, unspecified: Secondary | ICD-10-CM | POA: Insufficient documentation

## 2019-12-25 DIAGNOSIS — G9331 Postviral fatigue syndrome: Secondary | ICD-10-CM

## 2019-12-25 DIAGNOSIS — R5382 Chronic fatigue, unspecified: Secondary | ICD-10-CM

## 2019-12-25 DIAGNOSIS — G933 Postviral fatigue syndrome: Secondary | ICD-10-CM | POA: Diagnosis not present

## 2019-12-25 DIAGNOSIS — Z8579 Personal history of other malignant neoplasms of lymphoid, hematopoietic and related tissues: Secondary | ICD-10-CM | POA: Insufficient documentation

## 2019-12-25 DIAGNOSIS — E559 Vitamin D deficiency, unspecified: Secondary | ICD-10-CM | POA: Diagnosis not present

## 2019-12-25 DIAGNOSIS — R1310 Dysphagia, unspecified: Secondary | ICD-10-CM | POA: Insufficient documentation

## 2019-12-25 MED ORDER — N-ACETYL CYSTEINE 600 MG PO TABS: 1.0000 | ORAL_TABLET | Freq: Every morning | ORAL | 0 refills | Status: DC

## 2019-12-25 NOTE — Progress Notes (Signed)
Subjective:    Patient ID: Luke [name], male    DOB: Feb 16, 1950, 69 y.o.   MRN: [id number]  HPI  Luke [name] presents for follow-up of low energy after COVID diagnosis in February. He was told to start taking Vitamin D, C, Zinc. Hair started growing. From 6am to 12pm he can move around. After 12 he is exhausted. He feels weak. He is just pushing himself. His sister accompanies him today.  1) Low energy: He continues to have low energy, worsened since last time. The Ritalin has not helped, so we discussed stopping it. His walking has decreased from 2 hours per day to 1 hour per day. He continues to take his supplements. He has stopped working. He can't work because of his fatigue. He has a good mood. Sense of taste has not improved yet. Since last visit he was found to have a cancerous lesion in is throat- he is awaiting pathology results.   2) Active smoking: He understands he should quit smoking given that it increases his risk of cancer and a worse prognosis from his cancer. He has 2 packs left and then would like to quit. Says he has used quitting aides in the past without benefit.   3) Difficulty swallowing: He has what sounds like radiating fibrosis and does not produce enough saliva- often aspirates foods, especially liquids like milkshakes. Has been through swallow therapy without benefit. He has been paying to which foods he can tolerate and which he cannot. Feels like foods is being aspirated into his lungs and understands his risk of pneumonia.    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 He was a work-a-holic before he got sick. He collapsed after that. He felt depressed.  He walks the dog 2 miles every morning  He is having a lot of brain fog.   He is getting better.   He continues to work.   He has a bed sense of taste because of his COVID.   Shows me his beautiful dog.    Pain Inventory Average Pain 0 Pain Right Now 0 My pain is no pain  In the last 24 hours,  has pain interfered with the following? General activity 0 Relation with others 0 Enjoyment of life 0 What TIME of day is your pain at its worst? daytime and evening Sleep (in general) Good  Pain is worse with: no pain Pain improves with: no pain Relief from Meds: no pain      Family History  Problem Relation Age of Onset  . Breast cancer Mother   . Heart disease Mother   . Cancer Mother        breast, back, bladder  . Heart attack Father   . Heart disease Father    Social History   Socioeconomic History  . Marital status: Single    Spouse name: Not on file  . Number of children: 1  . Years of education: [employer]  . Highest education level: High school graduate  Occupational History  . Not on file  Tobacco Use  . Smoking status: Current Every Day Smoker    Packs/day: 1.00    Years: 51.00    Pack years: 51.00    Types: Cigarettes    Start date: 30  . Smokeless tobacco: Former Systems developer  . Tobacco comment: 05/03/19- down to half a pack or less / day  Vaping Use  . Vaping Use: Never used  Substance and Sexual Activity  . Alcohol use:  No  . Drug use: No  . Sexual activity: Not on file  Other Topics Concern  . Not on file  Social History Narrative  . Not on file   Social Determinants of Health   Financial Resource Strain: Low Risk   . Difficulty of Paying Living Expenses: Not hard at all  Food Insecurity: No Food Insecurity  . Worried About Charity fundraiser in the Last Year: Never true  . Ran Out of Food in the Last Year: Never true  Transportation Needs: No Transportation Needs  . Lack of Transportation (Medical): No  . Lack of Transportation (Non-Medical): No  Physical Activity: Sufficiently Active  . Days of Exercise per Week: 7 days  . Minutes of Exercise per Session: 30 min  Stress: No Stress Concern Present  . Feeling of Stress : Not at all  Social Connections: Not on file   Past Surgical History:  Procedure Laterality Date  . BALLOON  DILATION N/A 01/30/2018   Procedure: BALLOON DILATION;  Surgeon: Mauri Pole, MD;  Location: [facility] ENDOSCOPY;  Service: Endoscopy;  Laterality: N/A;  . COLONOSCOPY    . COLONOSCOPY WITH PROPOFOL N/A 11/29/2016   Procedure: COLONOSCOPY WITH PROPOFOL;  Surgeon: Manya Silvas, MD;  Location: [facility] ENDOSCOPY;  Service: Endoscopy;  Laterality: N/A;  . COLONOSCOPY WITH PROPOFOL N/A 08/22/2017   Procedure: COLONOSCOPY WITH PROPOFOL;  Surgeon: Jonathon Bellows, MD;  Location: [facility] ENDOSCOPY;  Service: Gastroenterology;  Laterality: N/A;  . ESOPHAGEAL MANOMETRY N/A 10/05/2017   Procedure: ESOPHAGEAL MANOMETRY (EM);  Surgeon: Jonathon Bellows, MD;  Location: [facility] ENDOSCOPY;  Service: Gastroenterology;  Laterality: N/A;  . ESOPHAGEAL MANOMETRY N/A 01/30/2018   Procedure: ESOPHAGEAL MANOMETRY (EM);  Surgeon: Mauri Pole, MD;  Location: [facility] ENDOSCOPY;  Service: Endoscopy;  Laterality: N/A;  . ESOPHAGOGASTRODUODENOSCOPY N/A 03/09/2018   Procedure: ESOPHAGOGASTRODUODENOSCOPY (EGD);  Surgeon: Milus Banister, MD;  Location: [facility] END[facility];  Service: Endoscopy;  Laterality: N/A;  . ESOPHAGOGASTRODUODENOSCOPY (EGD) WITH PROPOFOL N/A 11/29/2016   Procedure: ESOPHAGOGASTRODUODENOSCOPY (EGD) WITH PROPOFOL;  Surgeon: Manya Silvas, MD;  Location: [facility] ENDOSCOPY;  Service: Endoscopy;  Laterality: N/A;  . ESOPHAGOGASTRODUODENOSCOPY (EGD) WITH PROPOFOL N/A 08/22/2017   Procedure: ESOPHAGOGASTRODUODENOSCOPY (EGD) WITH PROPOFOL;  Surgeon: Jonathon Bellows, MD;  Location: [facility] ENDOSCOPY;  Service: Gastroenterology;  Laterality: N/A;  . ESOPHAGOGASTRODUODENOSCOPY (EGD) WITH PROPOFOL N/A 01/30/2018   Procedure: ESOPHAGOGASTRODUODENOSCOPY (EGD) WITH PROPOFOL;  Surgeon: Mauri Pole, MD;  Location: [facility] ENDOSCOPY;  Service: Endoscopy;  Laterality: N/A;  Mano probe to be placed during EGD  . EUS N/A 03/09/2018   Procedure: UPPER ENDOSCOPIC ULTRASOUND (EUS) RADIAL;  Surgeon: Milus Banister, MD;  Location: [facility] ENDOSCOPY;   Service: Endoscopy;  Laterality: N/A;  . NECK SURGERY     status post diagnosis of cancer with chemo and radiation  . SUBMUCOSAL INJECTION  03/09/2018   Procedure: SUBMUCOSAL INJECTION;  Surgeon: Milus Banister, MD;  Location: [facility] ENDOS[facility];  Service: Endoscopy;;  . TONSILLECTOMY     Past Medical History:  Diagnosis Date  . Anxiety   . Arthritis   . Carcinoma metastatic to lymph node ([place])   . Cataract    right  . COPD (chronic obstructive pulmonary disease) ([place])   . Depression   . GERD (gastroesophageal reflux disease)   . Hoarseness of voice   . Personal history of tobacco use, presenting hazards to health February of 2001  . Thyroid disease    BP 99/71   Pulse 98   Temp 98.6 F (37 C)  Ht 5\' 11"  (1.803 m)   Wt 182 lb (82.6 kg)   SpO2 92%   BMI 25.38 kg/m   Opioid Risk Score:   Fall Risk Score:  `1  Depression screen PHQ 2/9  Depression screen [facility] 2/9 12/14/2019 10/12/2019 09/07/2019 05/10/2019 04/09/2019 03/28/2019 02/16/2019  Decreased Interest 0 1 0 3 3 0 0  Down, Depressed, Hopeless 0 0 0 1 2 0 0  PHQ - 2 Score 0 1 0 4 5 0 0  Altered sleeping - 3 0 2 1 - -  Tired, decreased energy - 3 3 3 3  - -  Change in appetite - 0 0 0 1 - -  Feeling bad or failure about yourself  - 0 0 3 3 - -  Trouble concentrating - 3 0 3 2 - -  Moving slowly or fidgety/restless - 1 0 0 2 - -  Suicidal thoughts - 0 0 1 2 - -  PHQ-9 Score - 11 3 16 19  - -  Difficult doing work/chores - Somewhat difficult Not difficult at all Somewhat difficult Extremely dIfficult - -    Review of Systems  Constitutional: Positive for fatigue.  HENT: Negative.   Eyes: Negative.   Respiratory: Positive for shortness of breath.   Cardiovascular: Negative.   Gastrointestinal: Negative.   Endocrine: Negative.   Genitourinary: Negative.   Musculoskeletal: Negative.   Skin: Negative.   Allergic/Immunologic: Negative.   Neurological: Positive for weakness.  Hematological: Negative.    Psychiatric/Behavioral: Negative.   All other systems reviewed and are negative.      Objective:   Physical Exam Gen: no distress, normal appearing, appears more cachetic than last visit.  HEENT: oral mucosa pink and moist, NCAT Cardio: Reg rate Chest: normal effort, normal rate of breathing Abd: soft, non-distended Ext: no edema Skin: intact Neuro: Alert Musculoskeletal: 5/5 throughout Psych: pleasant, normal affect     Assessment & Plan:  1) Post-COVID fatigue and brain fog -Physical therapy at [facility] ordered to improve energy, strength, and endurance -Discussed natural progression of disease -Stop methylphenidate since it is not helping.  -Continue to work as long as possible for mental stimulation -Showed picture of incentive spirometer and advised 10x per hour. He says he will buy this before next visit. -Continue walking dog 3 miles per day -Encouraged work as much as he can tolerate.  -Can consider stem cell patches -prescribed N-Acetyl-cysteine and educated regarding its mechanism of action.  -Discussed Benefits of Ghee and advised 1 tsp per day.  -can be used in cooking, high smoke point  -high in fat soluble vitamins A, D, E, and K which are important for skin and vision, preventing leaky gut, strong bones  -free of lactose and casein  -contains conjugated linoleic acid, which can reduce body fat, prevent cancer, decrease inflammation, and lower blood pressure  -high in butyrate- helps support healthy insulin levels, decreases inflammation, decreases digestive problems, maintains healthy gut microbiome  -decreases pain and inflammation  2) Vitamin D deficiency -Continue Vitamin D supplementation. Discussed low level.   3) Smoking cessation: Discussed current smoking habits, risk of lung cancer, prior smoking habits, medications/patches tried. Made goal to stop smoking after current 2 packs.   4) COPD IS as above  5) Aspiration: Discussed chin tuck, slow  eating, dysphagia foods types. Offered SLP but he defers at this time.  6) Possible esophageal cancer -pathology pending -he will likely need chemo and radiation at [facility] -discussed benefits of ketogenic diet.  -smoking cessation counseling  as above.   All questions answered. RTC in January

## 2019-12-25 NOTE — Patient Instructions (Signed)
Ketogenic Diet  Benefits of Ghee  -can be used in cooking, high smoke point  -high in fat soluble vitamins A, D, E, and K which are important for skin and vision, preventing leaky gut, strong bones  -free of lactose and casein  -contains conjugated linoleic acid, which can reduce body fat, prevent cancer, decrease inflammation, and lower blood pressure  -high in butyrate- helps support healthy insulin levels, decreases inflammation, decreases digestive problems, maintains healthy gut microbiome  -decreases pain and inflammation

## 2019-12-27 ENCOUNTER — Telehealth: Payer: Self-pay | Admitting: *Deleted

## 2019-12-27 NOTE — Telephone Encounter (Signed)
Contacted in attempt to schedule lung screening scan. Patient reports recent biopsy of vocal cords with suspicion of malignancy. Will follow patient and if he is receiving cancer treatment we will hold lung screening at this time.

## 2019-12-29 DIAGNOSIS — J449 Chronic obstructive pulmonary disease, unspecified: Secondary | ICD-10-CM | POA: Diagnosis not present

## 2019-12-31 ENCOUNTER — Encounter (INDEPENDENT_AMBULATORY_CARE_PROVIDER_SITE_OTHER): Payer: BC Managed Care – PPO

## 2019-12-31 ENCOUNTER — Encounter (INDEPENDENT_AMBULATORY_CARE_PROVIDER_SITE_OTHER): Payer: Self-pay

## 2019-12-31 ENCOUNTER — Ambulatory Visit (INDEPENDENT_AMBULATORY_CARE_PROVIDER_SITE_OTHER): Payer: BC Managed Care – PPO | Admitting: Vascular Surgery

## 2020-01-02 ENCOUNTER — Telehealth: Payer: Self-pay

## 2020-01-02 MED ORDER — LIDOCAINE VISCOUS HCL 2 % MT SOLN: 15.0000 mL | OROMUCOSAL | 0 refills | Status: DC | PRN

## 2020-01-02 NOTE — Telephone Encounter (Signed)
Ok, can you send in the lidocaine and will notify him to reach out to ENT

## 2020-01-02 NOTE — Telephone Encounter (Signed)
Patient just diagnosed with ENT cancer and has been seeing ENT at [facility]- I can send him in some viscous lidocaine, but I would advise him to contact them to see if there is anything he can use.

## 2020-01-02 NOTE — Telephone Encounter (Signed)
Patient called and would like to know if he can have something tohelp his mouth, he states that it is raw and irritated in his mouth and down his throat. He states that this is causing difficulty eating because the pain and he ends up getting choked.   Patient has an appt scheduled for 01/07/20 with you

## 2020-01-03 DIAGNOSIS — C329 Malignant neoplasm of larynx, unspecified: Secondary | ICD-10-CM | POA: Diagnosis not present

## 2020-01-07 ENCOUNTER — Encounter: Payer: Self-pay | Admitting: Family Medicine

## 2020-01-07 ENCOUNTER — Other Ambulatory Visit: Payer: Self-pay

## 2020-01-07 ENCOUNTER — Ambulatory Visit (INDEPENDENT_AMBULATORY_CARE_PROVIDER_SITE_OTHER): Payer: Medicare HMO | Admitting: Family Medicine

## 2020-01-07 VITALS — BP 93/63 | HR 81 | Temp 98.5°F | Wt 178.2 lb

## 2020-01-07 DIAGNOSIS — C329 Malignant neoplasm of larynx, unspecified: Secondary | ICD-10-CM | POA: Diagnosis not present

## 2020-01-07 DIAGNOSIS — Z720 Tobacco use: Secondary | ICD-10-CM

## 2020-01-07 DIAGNOSIS — R1313 Dysphagia, pharyngeal phase: Secondary | ICD-10-CM | POA: Diagnosis not present

## 2020-01-07 MED ORDER — BUPROPION HCL ER (SR) 150 MG PO TB12: ORAL_TABLET | ORAL | 1 refills | Status: DC

## 2020-01-07 MED ORDER — NICOTINE 21 MG/24HR TD PT24: 21.0000 mg | MEDICATED_PATCH | Freq: Every day | TRANSDERMAL | 1 refills | Status: DC

## 2020-01-07 MED ORDER — NICOTINE POLACRILEX 4 MG MT GUM: 4.0000 mg | CHEWING_GUM | OROMUCOSAL | 3 refills | Status: DC | PRN

## 2020-01-07 NOTE — Assessment & Plan Note (Signed)
He has already requested a feeding tube prior to his treatments. To follow with oncology. Call with any concerns.

## 2020-01-07 NOTE — Assessment & Plan Note (Signed)
Trying to cut down, but still smoking significantly on the patch. Discussed using gum and patch, but not cigarette or using e-cigarette without any nicotine in it. Will start wellbutrin. Recheck 1 month. Call with any concerns.

## 2020-01-07 NOTE — Progress Notes (Signed)
BP 93/63   Pulse 81   Temp 98.5 F (36.9 C) (Oral)   Wt 178 lb 3.2 oz (80.8 kg)   SpO2 94%   BMI 24.85 kg/m    Subjective:    Patient ID: Luke [name], male    DOB: 01/20/1950, 69 y.o.   MRN: [id number]  HPI: Luke [name] is a 69 y.o. male  Chief Complaint  Patient presents with  . Cancer    Pt states he just found out 01/03/20 that he has throat cancer, states he goes to see [facility] regarding treatment this Wednesday   . Hypotension        Recently diagnosed with laryngeal carcinoma. To have PET scan and see oncology at [facility] in 2 days. Full note not yet available. Throat has still been hurting a whole lot. Lidocaine is not helping. He tried some OTC chloraceptic and that has helped. He has been discussing feeding tube and radiation and chemo. THey are trying to maintain his voice. HE notes that he flet this way when he had cancer before. He is afraid of how it's going to work.   Continues to follow with post-covid clinic. No changes. Unclear if recent diagnosis is contributing to his symptoms.   SMOKING CESSATION Smoking Status: every day smoker  Smoking Amount: 2ppd Smoking Quit Date: not set Smoking triggers: waking up, eating, driving, stress Type of tobacco use: cigarettes Children in the house: no Other household members who smoke: no Treatments attempted: patches and gum Pneumovax: yes   Relevant past medical, surgical, family and social history reviewed and updated as indicated. Interim medical history since our last visit reviewed. Allergies and medications reviewed and updated.  Review of Systems  Constitutional: Positive for appetite change and fatigue. Negative for activity change, chills, diaphoresis, fever and unexpected weight change.  HENT: Positive for ear pain, sinus pain, sore throat and voice change. Negative for congestion, dental problem, drooling, ear discharge, facial swelling, hearing loss, mouth sores, nosebleeds, postnasal drip, rhinorrhea,  sinus pressure, sneezing, tinnitus and trouble swallowing.   Eyes: Negative.   Respiratory: Negative.   Cardiovascular: Negative.   Gastrointestinal: Negative.   Psychiatric/Behavioral: Negative.     Per HPI unless specifically indicated above     Objective:    BP 93/63   Pulse 81   Temp 98.5 F (36.9 C) (Oral)   Wt 178 lb 3.2 oz (80.8 kg)   SpO2 94%   BMI 24.85 kg/m   Wt Readings from Last 3 Encounters:  01/07/20 178 lb 3.2 oz (80.8 kg)  12/25/19 182 lb (82.6 kg)  12/14/19 185 lb (83.9 kg)    Physical Exam Vitals and nursing note reviewed.  Constitutional:      General: He is not in acute distress.    Appearance: Normal appearance. He is not ill-appearing, toxic-appearing or diaphoretic.  HENT:     Head: Normocephalic and atraumatic.     Right Ear: External ear normal.     Left Ear: External ear normal.     Nose: Nose normal.     Mouth/Throat:     Mouth: Mucous membranes are moist.     Pharynx: Oropharynx is clear.  Eyes:     General: No scleral icterus.       Right eye: No discharge.        Left eye: No discharge.     Extraocular Movements: Extraocular movements intact.     Conjunctiva/sclera: Conjunctivae normal.     Pupils: Pupils are equal, round,  and reactive to light.  Cardiovascular:     Rate and Rhythm: Normal rate and regular rhythm.     Pulses: Normal pulses.     Heart sounds: Normal heart sounds. No murmur heard. No friction rub. No gallop.   Pulmonary:     Effort: Pulmonary effort is normal. No respiratory distress.     Breath sounds: Normal breath sounds. No stridor. No wheezing, rhonchi or rales.  Chest:     Chest wall: No tenderness.  Musculoskeletal:        General: Normal range of motion.     Cervical back: Normal range of motion and neck supple.  Skin:    General: Skin is warm and dry.     Capillary Refill: Capillary refill takes less than 2 seconds.     Coloration: Skin is not jaundiced or pale.     Findings: No bruising, erythema,  lesion or rash.  Neurological:     General: No focal deficit present.     Mental Status: He is alert and oriented to person, place, and time. Mental status is at baseline.  Psychiatric:        Mood and Affect: Mood normal.        Behavior: Behavior normal.        Thought Content: Thought content normal.        Judgment: Judgment normal.     Results for orders placed or performed in visit on 12/03/19  Novel Coronavirus, NAA ([facility])   Specimen: Saline  Result Value Ref Range   SARS-CoV-2, NAA Not Detected Not Detected  SARS-COV-2, NAA 2 DAY TAT  Result Value Ref Range   SARS-CoV-2, NAA 2 DAY TAT Performed       Assessment & Plan:   Problem List Items Addressed This Visit      Respiratory   Dysphagia, pharyngeal phase    He has already requested a feeding tube prior to his treatments. To follow with oncology. Call with any concerns.       Laryngeal carcinoma (HCC) - Primary    Newly diagnosed. Going to oncology in 2 days to discuss radiation and chemo. Continue to follow with oncology. Continue to monitor closely. Call with any concerns.         Other   Tobacco abuse    Trying to cut down, but still smoking significantly on the patch. Discussed using gum and patch, but not cigarette or using e-cigarette without any nicotine in it. Will start wellbutrin. Recheck 1 month. Call with any concerns.       Relevant Medications   nicotine (NICODERM CQ) 21 mg/24hr patch   nicotine polacrilex (NICORETTE) 4 MG gum       Follow up plan: Return in about 4 weeks (around 02/04/2020).

## 2020-01-07 NOTE — Assessment & Plan Note (Signed)
Newly diagnosed. Going to oncology in 2 days to discuss radiation and chemo. Continue to follow with oncology. Continue to monitor closely. Call with any concerns.

## 2020-01-09 DIAGNOSIS — C329 Malignant neoplasm of larynx, unspecified: Secondary | ICD-10-CM | POA: Insufficient documentation

## 2020-01-14 DIAGNOSIS — C329 Malignant neoplasm of larynx, unspecified: Secondary | ICD-10-CM | POA: Diagnosis not present

## 2020-01-14 DIAGNOSIS — C321 Malignant neoplasm of supraglottis: Secondary | ICD-10-CM | POA: Diagnosis not present

## 2020-01-19 IMAGING — CT CT CHEST LCS NODULE FOLLOW-UP W/O CM
2 of 5 series · 15 of 40 positions shown, 18 images · non-contrast
Comparison: 08/30/2017 screening chest CT.

CLINICAL DATA: 67-year-old asymptomatic male current smoker with
92.25 pack-year smoking history. Patient presents for three-month
follow-up. Remote history of lymphoma.

EXAM:
CT CHEST WITHOUT CONTRAST FOR LUNG CANCER SCREENING NODULE FOLLOW-UP
TECHNIQUE: Multidetector CT imaging of the chest was performed following the
standard protocol without IV contrast.

[Series 3: lung lcs f/u · axial · 0.75mm/px · z∈[-1283,-983]mm · 12 of 334 slices shown, 15 images]
[im 17/334  mediastinal]
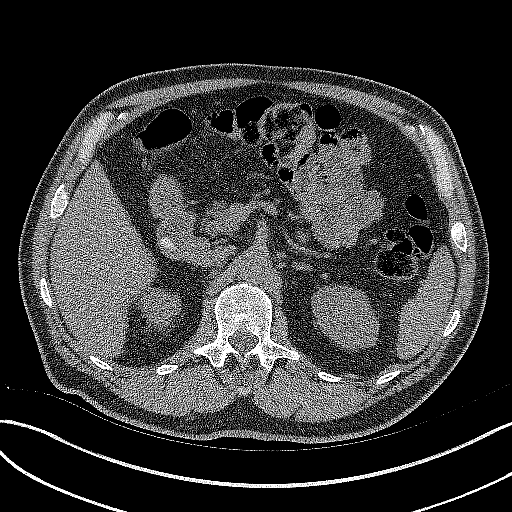
[im 17/334  lung]
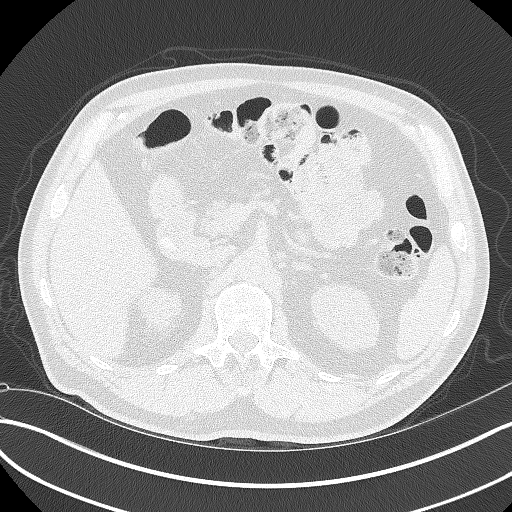
[im 50/334  lung]
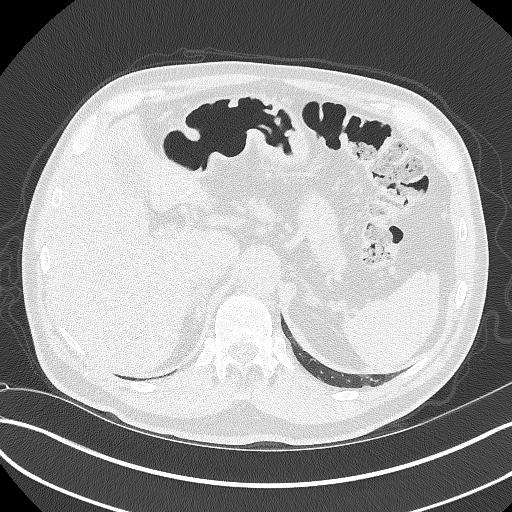
[im 67/334  lung]
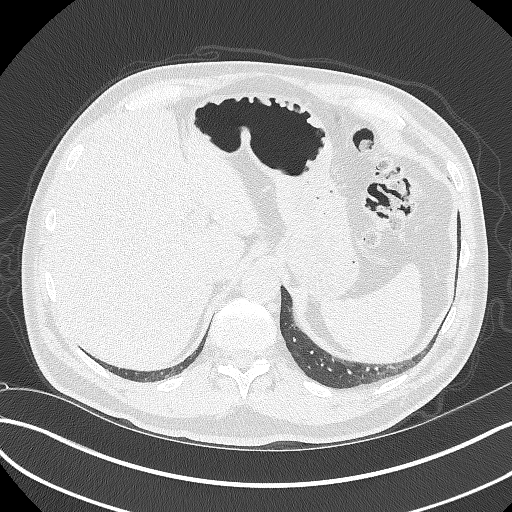
[im 100/334  lung]
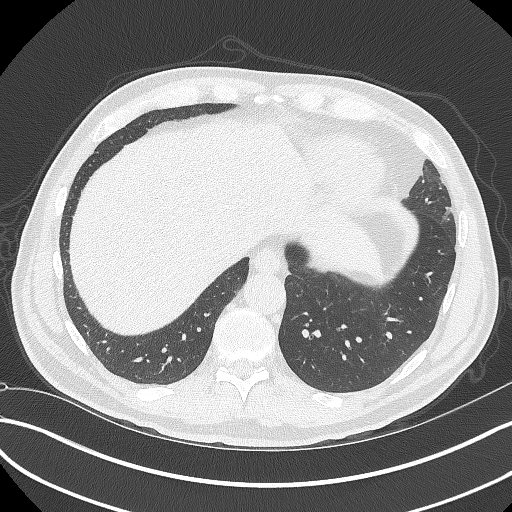
[im 134/334  mediastinal]
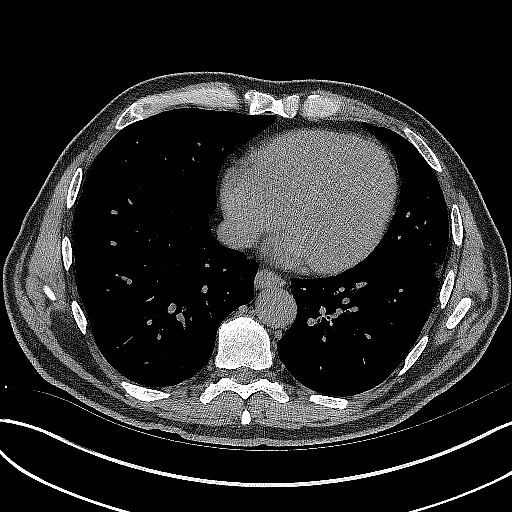
[im 134/334  lung]
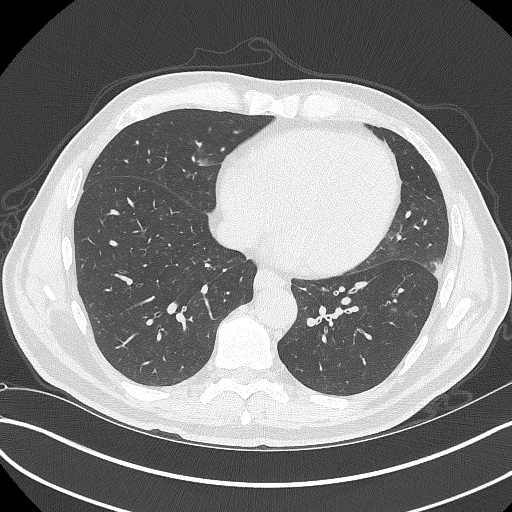
[im 150/334  lung]
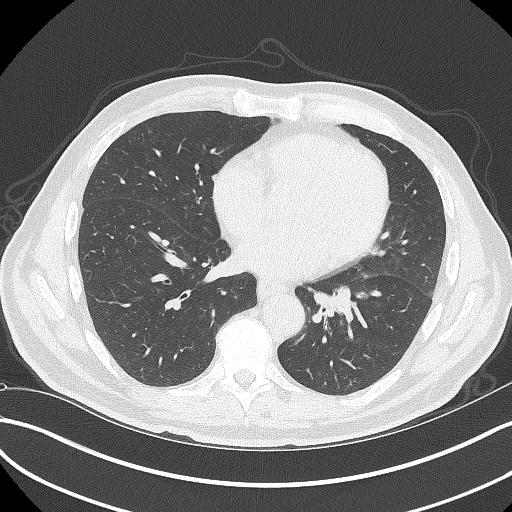
[im 184/334  lung]
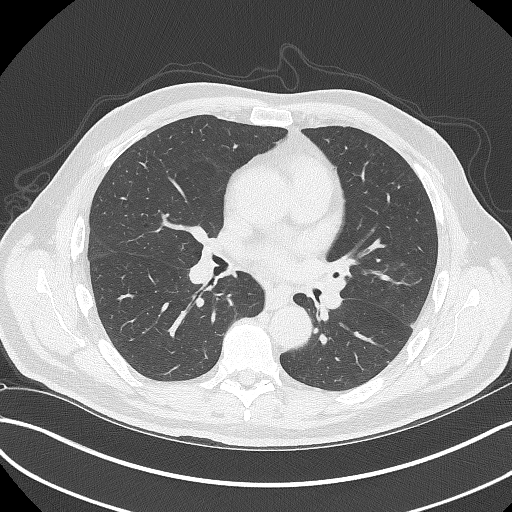
[im 200/334  lung]
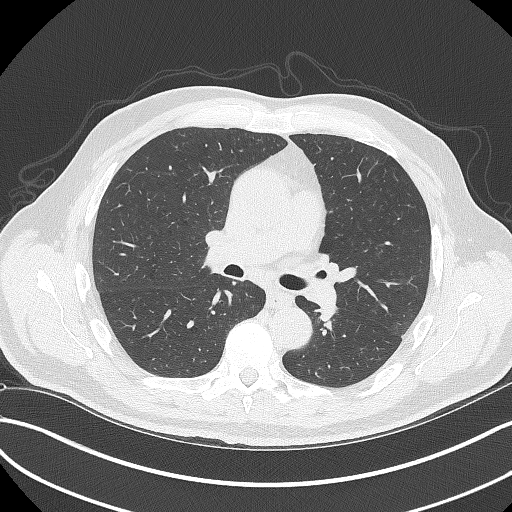
[im 234/334  mediastinal]
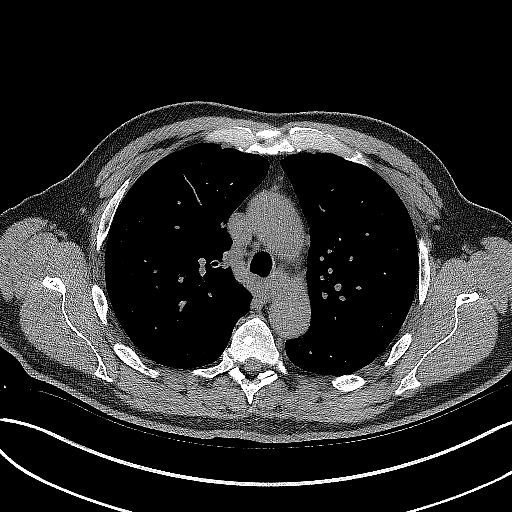
[im 234/334  lung]
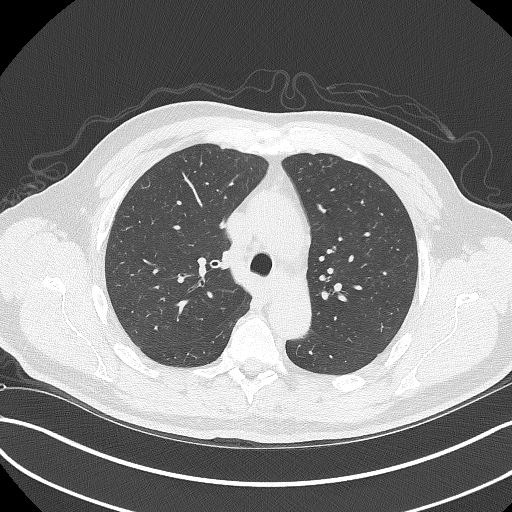
[im 267/334  lung]
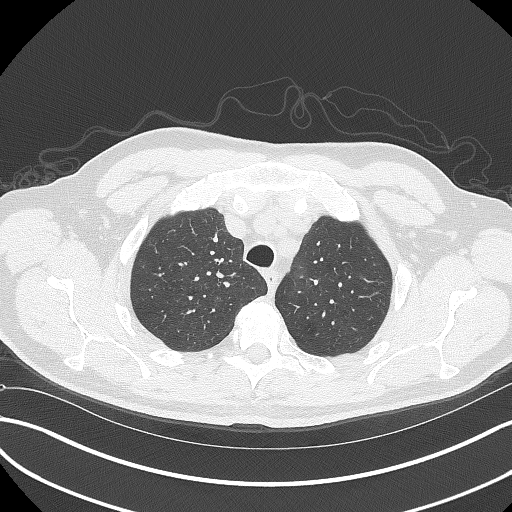
[im 284/334  lung]
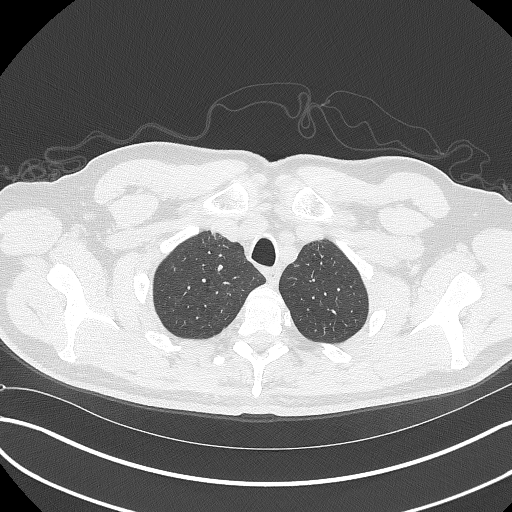
[im 317/334  lung]
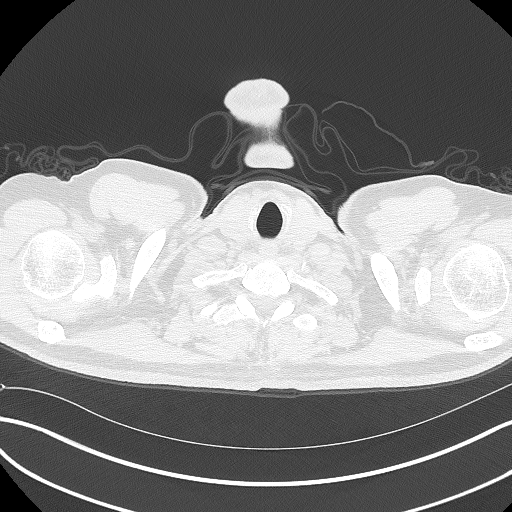

[Series 4: lcs f/u · coronal · 0.65mm/px · 3 of 293 slices shown]
[im 59/293  lung]
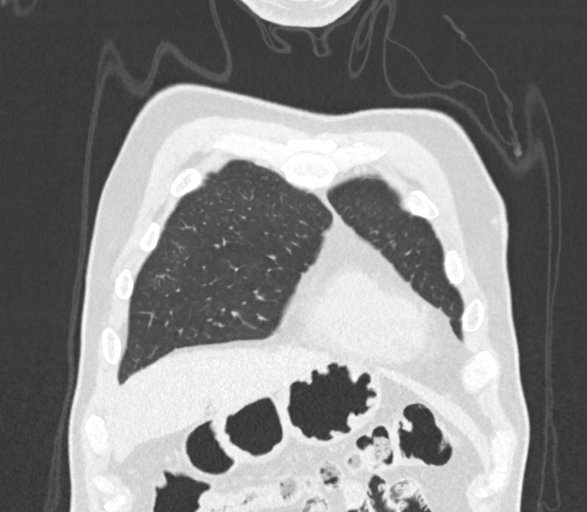
[im 117/293  lung]
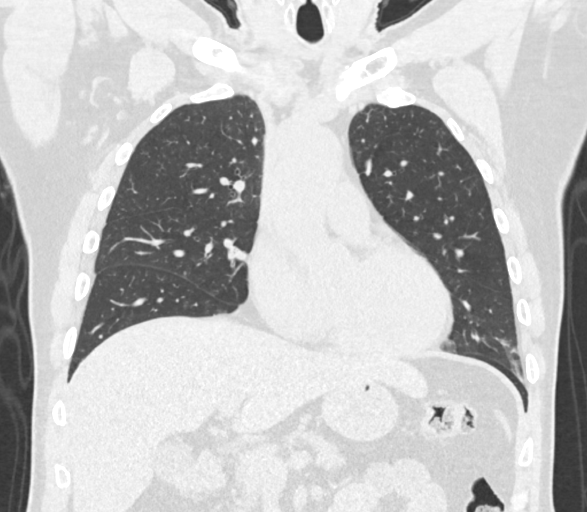
[im 176/293  lung]
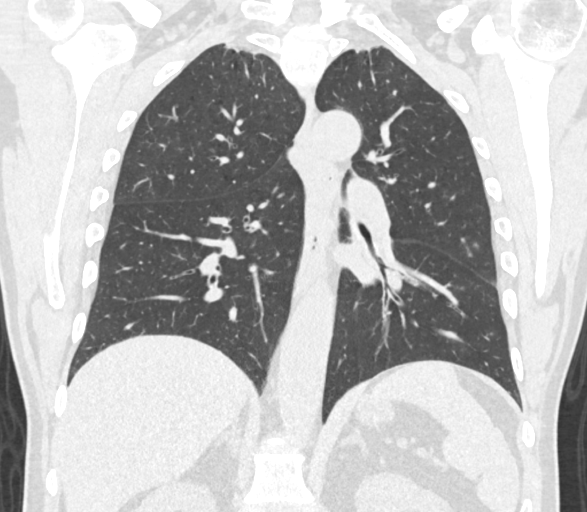

[15 of 40 positions shown; findings below may reference images not displayed]

FINDINGS: Cardiovascular: Normal heart size. No significant pericardial
effusion/thickening. Three-vessel coronary atherosclerosis.
Atherosclerotic thoracic aorta with stable ectatic 4.3 cm ascending
thoracic aorta. Normal caliber pulmonary arteries.

Mediastinum/Nodes: No discrete thyroid nodules. Unremarkable
esophagus. No pathologically enlarged axillary, mediastinal or hilar
lymph nodes, noting limited sensitivity for the detection of hilar
adenopathy on this noncontrast study.

Lungs/Pleura: No pneumothorax. No pleural effusion. Mild
centrilobular emphysema with mild diffuse bronchial wall thickening.
No acute consolidative airspace disease or lung masses. Previously
described patchy nodular foci of consolidation in both lungs have
all resolved, compatible with resolved infectious/inflammatory foci.
New patchy nodular ground-glass opacities in the lingula, largest
12.6 mm in volume derived mean diameter (series 3/image 208).

Upper abdomen: Cholelithiasis.

Musculoskeletal: No aggressive appearing focal osseous lesions.
Moderate upper thoracic spondylosis.
IMPRESSION: 1. Lung-RADS 2, benign appearance or behavior. Continue annual
screening with low-dose chest CT without contrast in 12 months.
2. Three-vessel coronary atherosclerosis.
3. Stable ectatic 4.3 cm ascending thoracic aorta, which can be
reassessed on follow-up screening chest CT in 12 months.
4. Cholelithiasis.

Aortic Atherosclerosis (SQ74D-YFT.T) and Emphysema (SQ74D-10F.X).

## 2020-01-23 ENCOUNTER — Other Ambulatory Visit: Payer: Self-pay | Admitting: Family Medicine

## 2020-01-23 DIAGNOSIS — K219 Gastro-esophageal reflux disease without esophagitis: Secondary | ICD-10-CM

## 2020-01-23 DIAGNOSIS — Z87738 Personal history of other specified (corrected) congenital malformations of digestive system: Secondary | ICD-10-CM | POA: Diagnosis not present

## 2020-01-23 DIAGNOSIS — C321 Malignant neoplasm of supraglottis: Secondary | ICD-10-CM | POA: Diagnosis not present

## 2020-01-23 DIAGNOSIS — Z9221 Personal history of antineoplastic chemotherapy: Secondary | ICD-10-CM | POA: Diagnosis not present

## 2020-01-23 DIAGNOSIS — R1310 Dysphagia, unspecified: Secondary | ICD-10-CM | POA: Diagnosis not present

## 2020-01-23 DIAGNOSIS — F17210 Nicotine dependence, cigarettes, uncomplicated: Secondary | ICD-10-CM | POA: Diagnosis not present

## 2020-01-23 DIAGNOSIS — R490 Dysphonia: Secondary | ICD-10-CM | POA: Diagnosis not present

## 2020-01-23 DIAGNOSIS — Z923 Personal history of irradiation: Secondary | ICD-10-CM | POA: Diagnosis not present

## 2020-01-23 DIAGNOSIS — R634 Abnormal weight loss: Secondary | ICD-10-CM | POA: Diagnosis not present

## 2020-01-23 DIAGNOSIS — J029 Acute pharyngitis, unspecified: Secondary | ICD-10-CM | POA: Diagnosis not present

## 2020-01-23 DIAGNOSIS — Z8579 Personal history of other malignant neoplasms of lymphoid, hematopoietic and related tissues: Secondary | ICD-10-CM

## 2020-01-25 DIAGNOSIS — R1314 Dysphagia, pharyngoesophageal phase: Secondary | ICD-10-CM | POA: Diagnosis not present

## 2020-01-25 DIAGNOSIS — Z923 Personal history of irradiation: Secondary | ICD-10-CM | POA: Diagnosis not present

## 2020-01-25 DIAGNOSIS — J387 Other diseases of larynx: Secondary | ICD-10-CM | POA: Diagnosis not present

## 2020-01-25 DIAGNOSIS — H9202 Otalgia, left ear: Secondary | ICD-10-CM | POA: Diagnosis not present

## 2020-01-25 DIAGNOSIS — J3801 Paralysis of vocal cords and larynx, unilateral: Secondary | ICD-10-CM | POA: Diagnosis not present

## 2020-01-25 DIAGNOSIS — Z9221 Personal history of antineoplastic chemotherapy: Secondary | ICD-10-CM | POA: Diagnosis not present

## 2020-01-25 DIAGNOSIS — C321 Malignant neoplasm of supraglottis: Secondary | ICD-10-CM | POA: Diagnosis not present

## 2020-01-25 DIAGNOSIS — F17210 Nicotine dependence, cigarettes, uncomplicated: Secondary | ICD-10-CM | POA: Diagnosis not present

## 2020-01-25 DIAGNOSIS — C329 Malignant neoplasm of larynx, unspecified: Secondary | ICD-10-CM | POA: Diagnosis not present

## 2020-01-25 DIAGNOSIS — R634 Abnormal weight loss: Secondary | ICD-10-CM | POA: Diagnosis not present

## 2020-01-25 DIAGNOSIS — R490 Dysphonia: Secondary | ICD-10-CM | POA: Diagnosis not present

## 2020-01-29 DIAGNOSIS — J449 Chronic obstructive pulmonary disease, unspecified: Secondary | ICD-10-CM | POA: Diagnosis not present

## 2020-01-30 DIAGNOSIS — F32A Depression, unspecified: Secondary | ICD-10-CM | POA: Diagnosis not present

## 2020-01-30 DIAGNOSIS — E0789 Other specified disorders of thyroid: Secondary | ICD-10-CM | POA: Diagnosis not present

## 2020-01-30 DIAGNOSIS — F17210 Nicotine dependence, cigarettes, uncomplicated: Secondary | ICD-10-CM | POA: Diagnosis not present

## 2020-01-30 DIAGNOSIS — I10 Essential (primary) hypertension: Secondary | ICD-10-CM | POA: Diagnosis not present

## 2020-01-30 DIAGNOSIS — Z720 Tobacco use: Secondary | ICD-10-CM | POA: Diagnosis not present

## 2020-01-30 DIAGNOSIS — F419 Anxiety disorder, unspecified: Secondary | ICD-10-CM | POA: Diagnosis not present

## 2020-01-30 DIAGNOSIS — K219 Gastro-esophageal reflux disease without esophagitis: Secondary | ICD-10-CM | POA: Diagnosis not present

## 2020-01-31 DIAGNOSIS — J449 Chronic obstructive pulmonary disease, unspecified: Secondary | ICD-10-CM | POA: Diagnosis not present

## 2020-01-31 DIAGNOSIS — T17908A Unspecified foreign body in respiratory tract, part unspecified causing other injury, initial encounter: Secondary | ICD-10-CM | POA: Diagnosis not present

## 2020-01-31 DIAGNOSIS — Z85828 Personal history of other malignant neoplasm of skin: Secondary | ICD-10-CM | POA: Diagnosis not present

## 2020-01-31 DIAGNOSIS — R1310 Dysphagia, unspecified: Secondary | ICD-10-CM | POA: Diagnosis not present

## 2020-01-31 DIAGNOSIS — K219 Gastro-esophageal reflux disease without esophagitis: Secondary | ICD-10-CM | POA: Diagnosis not present

## 2020-01-31 DIAGNOSIS — Z4659 Encounter for fitting and adjustment of other gastrointestinal appliance and device: Secondary | ICD-10-CM | POA: Diagnosis not present

## 2020-01-31 DIAGNOSIS — I6523 Occlusion and stenosis of bilateral carotid arteries: Secondary | ICD-10-CM | POA: Diagnosis not present

## 2020-01-31 DIAGNOSIS — I959 Hypotension, unspecified: Secondary | ICD-10-CM | POA: Diagnosis not present

## 2020-01-31 DIAGNOSIS — I10 Essential (primary) hypertension: Secondary | ICD-10-CM | POA: Diagnosis not present

## 2020-01-31 DIAGNOSIS — I639 Cerebral infarction, unspecified: Secondary | ICD-10-CM | POA: Diagnosis not present

## 2020-01-31 DIAGNOSIS — R778 Other specified abnormalities of plasma proteins: Secondary | ICD-10-CM | POA: Diagnosis not present

## 2020-01-31 DIAGNOSIS — G8191 Hemiplegia, unspecified affecting right dominant side: Secondary | ICD-10-CM | POA: Diagnosis not present

## 2020-01-31 DIAGNOSIS — L0211 Cutaneous abscess of neck: Secondary | ICD-10-CM | POA: Diagnosis not present

## 2020-01-31 DIAGNOSIS — Z931 Gastrostomy status: Secondary | ICD-10-CM | POA: Diagnosis not present

## 2020-01-31 DIAGNOSIS — R29818 Other symptoms and signs involving the nervous system: Secondary | ICD-10-CM | POA: Diagnosis not present

## 2020-01-31 DIAGNOSIS — I517 Cardiomegaly: Secondary | ICD-10-CM | POA: Diagnosis not present

## 2020-01-31 DIAGNOSIS — R140 Abdominal distension (gaseous): Secondary | ICD-10-CM | POA: Diagnosis not present

## 2020-01-31 DIAGNOSIS — R627 Adult failure to thrive: Secondary | ICD-10-CM | POA: Diagnosis not present

## 2020-01-31 DIAGNOSIS — R0789 Other chest pain: Secondary | ICD-10-CM | POA: Diagnosis not present

## 2020-01-31 DIAGNOSIS — R062 Wheezing: Secondary | ICD-10-CM | POA: Diagnosis not present

## 2020-01-31 DIAGNOSIS — M47812 Spondylosis without myelopathy or radiculopathy, cervical region: Secondary | ICD-10-CM | POA: Diagnosis not present

## 2020-01-31 DIAGNOSIS — J690 Pneumonitis due to inhalation of food and vomit: Secondary | ICD-10-CM | POA: Diagnosis not present

## 2020-01-31 DIAGNOSIS — R491 Aphonia: Secondary | ICD-10-CM | POA: Diagnosis not present

## 2020-01-31 DIAGNOSIS — S3991XA Unspecified injury of abdomen, initial encounter: Secondary | ICD-10-CM | POA: Diagnosis not present

## 2020-01-31 DIAGNOSIS — J9504 Tracheo-esophageal fistula following tracheostomy: Secondary | ICD-10-CM | POA: Diagnosis not present

## 2020-01-31 DIAGNOSIS — C329 Malignant neoplasm of larynx, unspecified: Secondary | ICD-10-CM | POA: Diagnosis not present

## 2020-01-31 DIAGNOSIS — F17210 Nicotine dependence, cigarettes, uncomplicated: Secondary | ICD-10-CM | POA: Diagnosis not present

## 2020-01-31 DIAGNOSIS — Z8521 Personal history of malignant neoplasm of larynx: Secondary | ICD-10-CM | POA: Diagnosis not present

## 2020-01-31 DIAGNOSIS — R112 Nausea with vomiting, unspecified: Secondary | ICD-10-CM | POA: Diagnosis not present

## 2020-01-31 DIAGNOSIS — C321 Malignant neoplasm of supraglottis: Secondary | ICD-10-CM | POA: Diagnosis not present

## 2020-01-31 DIAGNOSIS — R1312 Dysphagia, oropharyngeal phase: Secondary | ICD-10-CM | POA: Diagnosis not present

## 2020-01-31 HISTORY — PX: TOTAL LARYNGECTOMY: SHX2543

## 2020-02-01 DIAGNOSIS — S3991XA Unspecified injury of abdomen, initial encounter: Secondary | ICD-10-CM | POA: Diagnosis not present

## 2020-02-01 DIAGNOSIS — C329 Malignant neoplasm of larynx, unspecified: Secondary | ICD-10-CM | POA: Diagnosis not present

## 2020-02-01 DIAGNOSIS — C321 Malignant neoplasm of supraglottis: Secondary | ICD-10-CM | POA: Diagnosis not present

## 2020-02-01 DIAGNOSIS — R1312 Dysphagia, oropharyngeal phase: Secondary | ICD-10-CM | POA: Diagnosis not present

## 2020-02-02 DIAGNOSIS — C321 Malignant neoplasm of supraglottis: Secondary | ICD-10-CM | POA: Diagnosis not present

## 2020-02-02 DIAGNOSIS — T17908A Unspecified foreign body in respiratory tract, part unspecified causing other injury, initial encounter: Secondary | ICD-10-CM | POA: Diagnosis not present

## 2020-02-02 DIAGNOSIS — R1312 Dysphagia, oropharyngeal phase: Secondary | ICD-10-CM | POA: Diagnosis not present

## 2020-02-03 DIAGNOSIS — R1312 Dysphagia, oropharyngeal phase: Secondary | ICD-10-CM | POA: Diagnosis not present

## 2020-02-03 DIAGNOSIS — C321 Malignant neoplasm of supraglottis: Secondary | ICD-10-CM | POA: Diagnosis not present

## 2020-02-04 DIAGNOSIS — M47812 Spondylosis without myelopathy or radiculopathy, cervical region: Secondary | ICD-10-CM | POA: Diagnosis not present

## 2020-02-04 DIAGNOSIS — C321 Malignant neoplasm of supraglottis: Secondary | ICD-10-CM | POA: Diagnosis not present

## 2020-02-04 DIAGNOSIS — I639 Cerebral infarction, unspecified: Secondary | ICD-10-CM | POA: Diagnosis not present

## 2020-02-04 DIAGNOSIS — C329 Malignant neoplasm of larynx, unspecified: Secondary | ICD-10-CM | POA: Diagnosis not present

## 2020-02-04 DIAGNOSIS — Z4659 Encounter for fitting and adjustment of other gastrointestinal appliance and device: Secondary | ICD-10-CM | POA: Diagnosis not present

## 2020-02-04 DIAGNOSIS — I10 Essential (primary) hypertension: Secondary | ICD-10-CM | POA: Diagnosis not present

## 2020-02-04 DIAGNOSIS — I6523 Occlusion and stenosis of bilateral carotid arteries: Secondary | ICD-10-CM | POA: Diagnosis not present

## 2020-02-04 DIAGNOSIS — R29818 Other symptoms and signs involving the nervous system: Secondary | ICD-10-CM | POA: Diagnosis not present

## 2020-02-04 DIAGNOSIS — R1312 Dysphagia, oropharyngeal phase: Secondary | ICD-10-CM | POA: Diagnosis not present

## 2020-02-04 DIAGNOSIS — Z85828 Personal history of other malignant neoplasm of skin: Secondary | ICD-10-CM | POA: Diagnosis not present

## 2020-02-05 DIAGNOSIS — I639 Cerebral infarction, unspecified: Secondary | ICD-10-CM | POA: Diagnosis not present

## 2020-02-05 DIAGNOSIS — I959 Hypotension, unspecified: Secondary | ICD-10-CM | POA: Diagnosis not present

## 2020-02-05 DIAGNOSIS — R1312 Dysphagia, oropharyngeal phase: Secondary | ICD-10-CM | POA: Diagnosis not present

## 2020-02-05 DIAGNOSIS — C329 Malignant neoplasm of larynx, unspecified: Secondary | ICD-10-CM | POA: Diagnosis not present

## 2020-02-05 DIAGNOSIS — I517 Cardiomegaly: Secondary | ICD-10-CM | POA: Diagnosis not present

## 2020-02-06 DIAGNOSIS — C329 Malignant neoplasm of larynx, unspecified: Secondary | ICD-10-CM | POA: Diagnosis not present

## 2020-02-08 DIAGNOSIS — R0789 Other chest pain: Secondary | ICD-10-CM | POA: Diagnosis not present

## 2020-02-08 DIAGNOSIS — C329 Malignant neoplasm of larynx, unspecified: Secondary | ICD-10-CM | POA: Diagnosis not present

## 2020-02-08 DIAGNOSIS — R778 Other specified abnormalities of plasma proteins: Secondary | ICD-10-CM | POA: Diagnosis not present

## 2020-02-09 DIAGNOSIS — C329 Malignant neoplasm of larynx, unspecified: Secondary | ICD-10-CM | POA: Diagnosis not present

## 2020-02-09 DIAGNOSIS — R140 Abdominal distension (gaseous): Secondary | ICD-10-CM | POA: Diagnosis not present

## 2020-02-19 ENCOUNTER — Encounter: Payer: Medicare HMO | Admitting: Physical Medicine and Rehabilitation

## 2020-02-20 ENCOUNTER — Telehealth: Payer: Medicare HMO | Admitting: Family Medicine

## 2020-02-20 DIAGNOSIS — Z931 Gastrostomy status: Secondary | ICD-10-CM | POA: Diagnosis not present

## 2020-02-20 DIAGNOSIS — R112 Nausea with vomiting, unspecified: Secondary | ICD-10-CM | POA: Diagnosis not present

## 2020-02-20 DIAGNOSIS — R491 Aphonia: Secondary | ICD-10-CM | POA: Diagnosis not present

## 2020-02-22 DIAGNOSIS — C329 Malignant neoplasm of larynx, unspecified: Secondary | ICD-10-CM | POA: Diagnosis not present

## 2020-02-22 DIAGNOSIS — Z4659 Encounter for fitting and adjustment of other gastrointestinal appliance and device: Secondary | ICD-10-CM | POA: Diagnosis not present

## 2020-02-23 DIAGNOSIS — I639 Cerebral infarction, unspecified: Secondary | ICD-10-CM | POA: Diagnosis not present

## 2020-02-23 DIAGNOSIS — R1310 Dysphagia, unspecified: Secondary | ICD-10-CM | POA: Diagnosis not present

## 2020-02-23 DIAGNOSIS — K219 Gastro-esophageal reflux disease without esophagitis: Secondary | ICD-10-CM | POA: Diagnosis not present

## 2020-02-27 DIAGNOSIS — C329 Malignant neoplasm of larynx, unspecified: Secondary | ICD-10-CM | POA: Diagnosis not present

## 2020-02-29 DIAGNOSIS — J449 Chronic obstructive pulmonary disease, unspecified: Secondary | ICD-10-CM | POA: Diagnosis not present

## 2020-02-29 DIAGNOSIS — I10 Essential (primary) hypertension: Secondary | ICD-10-CM | POA: Diagnosis not present

## 2020-02-29 DIAGNOSIS — H269 Unspecified cataract: Secondary | ICD-10-CM | POA: Diagnosis not present

## 2020-02-29 DIAGNOSIS — I959 Hypotension, unspecified: Secondary | ICD-10-CM | POA: Diagnosis not present

## 2020-02-29 DIAGNOSIS — Z438 Encounter for attention to other artificial openings: Secondary | ICD-10-CM | POA: Diagnosis not present

## 2020-02-29 DIAGNOSIS — Z483 Aftercare following surgery for neoplasm: Secondary | ICD-10-CM | POA: Diagnosis not present

## 2020-02-29 DIAGNOSIS — J690 Pneumonitis due to inhalation of food and vomit: Secondary | ICD-10-CM | POA: Diagnosis not present

## 2020-02-29 DIAGNOSIS — C321 Malignant neoplasm of supraglottis: Secondary | ICD-10-CM | POA: Diagnosis not present

## 2020-02-29 DIAGNOSIS — C329 Malignant neoplasm of larynx, unspecified: Secondary | ICD-10-CM | POA: Diagnosis not present

## 2020-02-29 DIAGNOSIS — F17210 Nicotine dependence, cigarettes, uncomplicated: Secondary | ICD-10-CM | POA: Diagnosis not present

## 2020-03-03 ENCOUNTER — Telehealth: Payer: Self-pay | Admitting: *Deleted

## 2020-03-03 DIAGNOSIS — H269 Unspecified cataract: Secondary | ICD-10-CM | POA: Diagnosis not present

## 2020-03-03 DIAGNOSIS — I10 Essential (primary) hypertension: Secondary | ICD-10-CM | POA: Diagnosis not present

## 2020-03-03 DIAGNOSIS — Z438 Encounter for attention to other artificial openings: Secondary | ICD-10-CM | POA: Diagnosis not present

## 2020-03-03 DIAGNOSIS — J690 Pneumonitis due to inhalation of food and vomit: Secondary | ICD-10-CM | POA: Diagnosis not present

## 2020-03-03 DIAGNOSIS — C329 Malignant neoplasm of larynx, unspecified: Secondary | ICD-10-CM | POA: Diagnosis not present

## 2020-03-03 DIAGNOSIS — F17210 Nicotine dependence, cigarettes, uncomplicated: Secondary | ICD-10-CM | POA: Diagnosis not present

## 2020-03-03 DIAGNOSIS — C321 Malignant neoplasm of supraglottis: Secondary | ICD-10-CM | POA: Diagnosis not present

## 2020-03-03 DIAGNOSIS — Z483 Aftercare following surgery for neoplasm: Secondary | ICD-10-CM | POA: Diagnosis not present

## 2020-03-03 DIAGNOSIS — I959 Hypotension, unspecified: Secondary | ICD-10-CM | POA: Diagnosis not present

## 2020-03-03 NOTE — Telephone Encounter (Signed)
Pt. Came in feeding tube stopped up. Per Dr, [name] he was advised to go to ER or GI Dr.

## 2020-03-05 DIAGNOSIS — C321 Malignant neoplasm of supraglottis: Secondary | ICD-10-CM | POA: Diagnosis not present

## 2020-03-05 DIAGNOSIS — C329 Malignant neoplasm of larynx, unspecified: Secondary | ICD-10-CM | POA: Diagnosis not present

## 2020-03-05 DIAGNOSIS — Z438 Encounter for attention to other artificial openings: Secondary | ICD-10-CM | POA: Diagnosis not present

## 2020-03-05 DIAGNOSIS — I959 Hypotension, unspecified: Secondary | ICD-10-CM | POA: Diagnosis not present

## 2020-03-05 DIAGNOSIS — F17210 Nicotine dependence, cigarettes, uncomplicated: Secondary | ICD-10-CM | POA: Diagnosis not present

## 2020-03-05 DIAGNOSIS — H269 Unspecified cataract: Secondary | ICD-10-CM | POA: Diagnosis not present

## 2020-03-05 DIAGNOSIS — J690 Pneumonitis due to inhalation of food and vomit: Secondary | ICD-10-CM | POA: Diagnosis not present

## 2020-03-05 DIAGNOSIS — Z483 Aftercare following surgery for neoplasm: Secondary | ICD-10-CM | POA: Diagnosis not present

## 2020-03-05 DIAGNOSIS — I10 Essential (primary) hypertension: Secondary | ICD-10-CM | POA: Diagnosis not present

## 2020-03-07 DIAGNOSIS — C329 Malignant neoplasm of larynx, unspecified: Secondary | ICD-10-CM | POA: Diagnosis not present

## 2020-03-07 DIAGNOSIS — Z438 Encounter for attention to other artificial openings: Secondary | ICD-10-CM | POA: Diagnosis not present

## 2020-03-07 DIAGNOSIS — I10 Essential (primary) hypertension: Secondary | ICD-10-CM | POA: Diagnosis not present

## 2020-03-07 DIAGNOSIS — F17210 Nicotine dependence, cigarettes, uncomplicated: Secondary | ICD-10-CM | POA: Diagnosis not present

## 2020-03-07 DIAGNOSIS — H269 Unspecified cataract: Secondary | ICD-10-CM | POA: Diagnosis not present

## 2020-03-07 DIAGNOSIS — Z483 Aftercare following surgery for neoplasm: Secondary | ICD-10-CM | POA: Diagnosis not present

## 2020-03-07 DIAGNOSIS — C321 Malignant neoplasm of supraglottis: Secondary | ICD-10-CM | POA: Diagnosis not present

## 2020-03-07 DIAGNOSIS — J690 Pneumonitis due to inhalation of food and vomit: Secondary | ICD-10-CM | POA: Diagnosis not present

## 2020-03-07 DIAGNOSIS — I959 Hypotension, unspecified: Secondary | ICD-10-CM | POA: Diagnosis not present

## 2020-03-10 ENCOUNTER — Other Ambulatory Visit: Payer: Self-pay

## 2020-03-10 ENCOUNTER — Ambulatory Visit: Payer: Medicare HMO | Admitting: Cardiology

## 2020-03-10 ENCOUNTER — Encounter: Payer: Self-pay | Admitting: Cardiology

## 2020-03-10 VITALS — BP 90/70 | HR 67 | Ht 72.0 in | Wt 177.0 lb

## 2020-03-10 DIAGNOSIS — I712 Thoracic aortic aneurysm, without rupture, unspecified: Secondary | ICD-10-CM

## 2020-03-10 DIAGNOSIS — I714 Abdominal aortic aneurysm, without rupture, unspecified: Secondary | ICD-10-CM

## 2020-03-10 DIAGNOSIS — Z930 Tracheostomy status: Secondary | ICD-10-CM | POA: Diagnosis not present

## 2020-03-10 DIAGNOSIS — F17200 Nicotine dependence, unspecified, uncomplicated: Secondary | ICD-10-CM

## 2020-03-10 DIAGNOSIS — E7800 Pure hypercholesterolemia, unspecified: Secondary | ICD-10-CM | POA: Diagnosis not present

## 2020-03-10 NOTE — Patient Instructions (Signed)
Medication Instructions:  Your physician recommends that you continue on your current medications as directed. Please refer to the Current Medication list given to you today.  *If you need a refill on your cardiac medications before your next appointment, please call your pharmacy*   Lab Work:  Your physician recommends that you return for a FASTING lipid profile: At your earliest convenience.  - You will need to be fasting. Please do not have anything to eat or drink after midnight the morning you have the lab work. You may only have water or black coffee with no cream or sugar. - Please go to the [facility]. You will check in at the front desk to the right as you walk into the atrium. Valet Parking is offered if needed. - No appointment needed. You may go any day between 7 am and 6 pm.   Testing/Procedures: None ordered   Follow-Up: At [facility], you and your health needs are our priority.  As part of our continuing mission to provide you with exceptional heart care, we have created designated Provider Care Teams.  These Care Teams include your primary Cardiologist (physician) and Advanced Practice Providers (APPs -  Physician Assistants and Nurse Practitioners) who all work together to provide you with the care you need, when you need it.  We recommend signing up for the patient portal called "MyChart".  Sign up information is provided on this After Visit Summary.  MyChart is used to connect with patients for Virtual Visits (Telemedicine).  Patients are able to view lab/test results, encounter notes, upcoming appointments, etc.  Non-urgent messages can be sent to your provider as well.   To learn more about what you can do with MyChart, go to [email or web address].    Your next appointment:   6 month(s)  The format for your next appointment:   In Person  Provider:   Kate Sable, MD   Other Instructions

## 2020-03-10 NOTE — Progress Notes (Signed)
Cardiology Office Note:    Date:  03/10/2020   ID:  Luke [name], DOB Sep 22, 1950, MRN [id number]  PCP:  Valerie Roys, DO  Cardiologist:  Kate Sable, MD  Electrophysiologist:  None   Referring MD: Nobie Putnam *   Chief Complaint  Patient presents with  . Other    6 month f/u no complaints today. Meds reviewed verbally with pt.    History of Present Illness:    Luke [name] is a 70 y.o. male with a hx of anxiety, COPD, abdominal aortic aneurysm (4.4cm infra renal CT 06/2019), mild thoracic aortic aneurysm 4.1 cm (CT 06/2019), recent throat cancer s/p resection at [place] (01/2020), CVA 01/2020, former smoker x50+ years who presents for follow-up.    Previously seen for hyperlipidemia, aortic and abdominal aneurysm.  Follows up with vascular surgery for abdominal aneurysm.  Aortic aneurysm was mildly dilated, serial echocardiograms was recommended.  LDL not at goal, Lipitor was started, patient advised to obtain lipid profile prior to follow-up visit.    He was diagnosed with throat cancer and underwent a resection 01/2020 at [facility].  After his surgery, he developed a stroke associated with right-sided weakness.  His right side muscle strength is getting better.  Prior notes Echo 04/1935 normal systolic function, impaired relaxation, EF 55 to 60%, mild ascending aorta dilatation, 42 mm. Ultrasound lower extremity arterial 05/31/2019.  No occlusive disease, abdominal aneurysm, iliac artery aneurysm bilaterally. Lexiscan Myoview 04/2019, low risk study, no evidence of ischemia.  Past Medical History:  Diagnosis Date  . Anxiety   . Arthritis   . Carcinoma metastatic to lymph node ([place])   . Cataract    right  . COPD (chronic obstructive pulmonary disease) ([place])   . Depression   . GERD (gastroesophageal reflux disease)   . Hoarseness of voice   . Personal history of tobacco use, presenting hazards to health February of 2001  . Thyroid disease     Past  Surgical History:  Procedure Laterality Date  . BALLOON DILATION N/A 01/30/2018   Procedure: BALLOON DILATION;  Surgeon: Mauri Pole, MD;  Location: [facility] ENDOSCOPY;  Service: Endoscopy;  Laterality: N/A;  . COLONOSCOPY    . COLONOSCOPY WITH PROPOFOL N/A 11/29/2016   Procedure: COLONOSCOPY WITH PROPOFOL;  Surgeon: Manya Silvas, MD;  Location: [facility] ENDOSCOPY;  Service: Endoscopy;  Laterality: N/A;  . COLONOSCOPY WITH PROPOFOL N/A 08/22/2017   Procedure: COLONOSCOPY WITH PROPOFOL;  Surgeon: Jonathon Bellows, MD;  Location: [facility] ENDOSCOPY;  Service: Gastroenterology;  Laterality: N/A;  . ESOPHAGEAL MANOMETRY N/A 10/05/2017   Procedure: ESOPHAGEAL MANOMETRY (EM);  Surgeon: Jonathon Bellows, MD;  Location: [facility] ENDOSCOPY;  Service: Gastroenterology;  Laterality: N/A;  . ESOPHAGEAL MANOMETRY N/A 01/30/2018   Procedure: ESOPHAGEAL MANOMETRY (EM);  Surgeon: Mauri Pole, MD;  Location: [facility] ENDOSCOPY;  Service: Endoscopy;  Laterality: N/A;  . ESOPHAGOGASTRODUODENOSCOPY N/A 03/09/2018   Procedure: ESOPHAGOGASTRODUODENOSCOPY (EGD);  Surgeon: Milus Banister, MD;  Location: [facility] ENDOSCOPY;  Service: Endoscopy;  Laterality: N/A;  . ESOPHAGOGASTRODUODENOSCOPY (EGD) WITH PROPOFOL N/A 11/29/2016   Procedure: ESOPHAGOGASTRODUODENOSCOPY (EGD) WITH PROPOFOL;  Surgeon: Manya Silvas, MD;  Location: [facility] ENDOSCOPY;  Service: Endoscopy;  Laterality: N/A;  . ESOPHAGOGASTRODUODENOSCOPY (EGD) WITH PROPOFOL N/A 08/22/2017   Procedure: ESOPHAGOGASTRODUODENOSCOPY (EGD) WITH PROPOFOL;  Surgeon: Jonathon Bellows, MD;  Location: [facility] ENDOSCOPY;  Service: Gastroenterology;  Laterality: N/A;  . ESOPHAGOGASTRODUODENOSCOPY (EGD) WITH PROPOFOL N/A 01/30/2018   Procedure: ESOPHAGOGASTRODUODENOSCOPY (EGD) WITH PROPOFOL;  Surgeon: Mauri Pole, MD;  Location: [facility]  ENDOSCOPY;  Service: Endoscopy;  Laterality: N/A;  Mano probe to be placed during EGD  . EUS N/A 03/09/2018   Procedure: UPPER ENDOSCOPIC ULTRASOUND (EUS) RADIAL;   Surgeon: Milus Banister, MD;  Location: [facility] ENDOSCOPY;  Service: Endoscopy;  Laterality: N/A;  . NECK SURGERY     status post diagnosis of cancer with chemo and radiation  . SUBMUCOSAL INJECTION  03/09/2018   Procedure: SUBMUCOSAL INJECTION;  Surgeon: Milus Banister, MD;  Location: [facility] ENDOSCOPY;  Service: Endoscopy;;  . TONSILLECTOMY    . TOTAL LARYNGECTOMY      Current Medications: Current Meds  Medication Sig  . aspirin EC 81 MG tablet Take 1 tablet (81 mg total) by mouth daily. Swallow whole.  Marland Kitchen atorvastatin (LIPITOR) 40 MG tablet Take 40 mg by mouth daily.  . Cyanocobalamin (B-12 PO) Take by mouth daily in the afternoon.  . gabapentin (NEURONTIN) 100 MG capsule Take 100 mg by mouth 3 (three) times daily.  Marland Kitchen levothyroxine (SYNTHROID) 88 MCG tablet TAKE 1 TABLET EVERY DAY BEFORE BREAKFAST  . meloxicam (MOBIC) 15 MG tablet TAKE 1 TABLET EVERY DAY  . Multiple Vitamin (MULTIVITAMIN WITH MINERALS) TABS tablet Take 1 tablet by mouth daily. One-A-Day Multivitamin  . omeprazole (PRILOSEC) 20 MG capsule TAKE 1 CAPSULE DAILY BEFORE BREAKFAST  . Vitamin D, Ergocalciferol, (DRISDOL) 1.25 MG (50000 UNIT) CAPS capsule Take 1 capsule (50,000 Units total) by mouth every 7 (seven) days.  . vitamin E 180 MG (400 UNITS) capsule Take 400 Units by mouth daily.  Marland Kitchen zinc sulfate 220 (50 Zn) MG capsule Take 220 mg by mouth daily.     Allergies:   Patient has no known allergies.   Social History   Socioeconomic History  . Marital status: Single    Spouse name: Not on file  . Number of children: 1  . Years of education: [employer]  . Highest education level: High school graduate  Occupational History  . Not on file  Tobacco Use  . Smoking status: Current Every Day Smoker    Packs/day: 1.00    Years: 51.00    Pack years: 51.00    Types: Cigarettes    Start date: 78  . Smokeless tobacco: Former Systems developer  . Tobacco comment: 05/03/19- down to half a pack or less / day  Vaping Use  . Vaping Use:  Never used  Substance and Sexual Activity  . Alcohol use: No  . Drug use: No  . Sexual activity: Not on file  Other Topics Concern  . Not on file  Social History Narrative  . Not on file   Social Determinants of Health   Financial Resource Strain: Low Risk   . Difficulty of Paying Living Expenses: Not hard at all  Food Insecurity: No Food Insecurity  . Worried About Charity fundraiser in the Last Year: Never true  . Ran Out of Food in the Last Year: Never true  Transportation Needs: No Transportation Needs  . Lack of Transportation (Medical): No  . Lack of Transportation (Non-Medical): No  Physical Activity: Sufficiently Active  . Days of Exercise per Week: 7 days  . Minutes of Exercise per Session: 30 min  Stress: No Stress Concern Present  . Feeling of Stress : Not at all  Social Connections: Not on file     Family History: The patient's family history includes Breast cancer in his mother; Cancer in his mother; Heart attack in his father; Heart disease in his father and mother.  ROS:   Please see the history of present illness.     All other systems reviewed and are negative.  EKGs/Labs/Other Studies Reviewed:    The following studies were reviewed today:   EKG:  EKG is  ordered today.  The ekg ordered today demonstrates normal sinus rhythm, normal ECG.  Recent Labs: 09/07/2019: ALT 12; BUN 29; Creatinine, Ser 1.57; Hemoglobin 13.8; Platelets 242; Potassium 4.2; Sodium 141; TSH 1.570  Recent Lipid Panel    Component Value Date/Time   CHOL 186 02/09/2019 1003   TRIG 75 02/09/2019 1003   HDL 38 (L) 02/09/2019 1003   CHOLHDL 4.9 02/09/2019 1003   LDLCALC 131 (H) 02/09/2019 1003    Physical Exam:    VS:  BP 90/70 (BP Location: Left Arm, Patient Position: Sitting, Cuff Size: Normal)   Pulse 67   Ht 6' (1.829 m)   Wt 177 lb (80.3 kg)   SpO2 98%   BMI 24.01 kg/m     Wt Readings from Last 3 Encounters:  03/10/20 177 lb (80.3 kg)  01/07/20 178 lb 3.2 oz  (80.8 kg)  12/25/19 182 lb (82.6 kg)     GEN:  Well nourished, well developed in no acute distress, slow speech HEENT: Normal NECK: No JVD; tracheostomy noted. LYMPHATICS: No lymphadenopathy CARDIAC: RRR, no murmurs, rubs, gallops RESPIRATORY: Decreased breath sounds, exp ronchi noted ABDOMEN: Soft, non-tender, non-distended MUSCULOSKELETAL:  No edema; No deformity  SKIN: Warm and dry NEUROLOGIC:  Alert and oriented x 3 PSYCHIATRIC:  Normal affect   ASSESSMENT:    1. Abdominal aortic aneurysm (AAA) without rupture ([place])   2. Thoracic aortic aneurysm without rupture (HCC)   3. Smoking   4. Pure hypercholesterolemia    PLAN:    In order of problems listed above:   1. He has AAA, infrarenal, 4.4 cm.  Keep appointment with vascular surgery..   2. Mild ascending aortic aneurysm, 4.1 cm.  No intervention indicated at this time.  Serial monitoring with echocardiogram. 3. He is a current smoker x50 years.  Cessation advised. 4. Patient has hyperlipidemia, 10-year ASCVD 19%.  Aspirin, statin.  Advised to obtain fasting lipid profile.  Follow-up 6 months  Total encounter time 35 minutes  Greater than 50% was spent in counseling and coordination of care with the patient.   This note was generated in part or whole with voice recognition software. Voice recognition is usually quite accurate but there are transcription errors that can and very often do occur. I apologize for any typographical errors that were not detected and corrected.   Medication Adjustments/Labs and Tests Ordered: Current medicines are reviewed at length with the patient today.  Concerns regarding medicines are outlined above.  Orders Placed This Encounter  Procedures  . EKG 12-Lead   No orders of the defined types were placed in this encounter.   Patient Instructions  Medication Instructions:  Your physician recommends that you continue on your current medications as directed. Please refer to the Current  Medication list given to you today.  *If you need a refill on your cardiac medications before your next appointment, please call your pharmacy*   Lab Work:  Your physician recommends that you return for a FASTING lipid profile: At your earliest convenience.  - You will need to be fasting. Please do not have anything to eat or drink after midnight the morning you have the lab work. You may only have water or black coffee with no cream or sugar. - Please go to  the [facility]. You will check in at the front desk to the right as you walk into the atrium. Valet Parking is offered if needed. - No appointment needed. You may go any day between 7 am and 6 pm.   Testing/Procedures: None ordered   Follow-Up: At [facility], you and your health needs are our priority.  As part of our continuing mission to provide you with exceptional heart care, we have created designated Provider Care Teams.  These Care Teams include your primary Cardiologist (physician) and Advanced Practice Providers (APPs -  Physician Assistants and Nurse Practitioners) who all work together to provide you with the care you need, when you need it.  We recommend signing up for the patient portal called "MyChart".  Sign up information is provided on this After Visit Summary.  MyChart is used to connect with patients for Virtual Visits (Telemedicine).  Patients are able to view lab/test results, encounter notes, upcoming appointments, etc.  Non-urgent messages can be sent to your provider as well.   To learn more about what you can do with MyChart, go to [email or web address].    Your next appointment:   6 month(s)  The format for your next appointment:   In Person  Provider:   Kate Sable, MD   Other Instructions      Signed, Kate Sable, MD  03/10/2020 11:40 AM    [place]

## 2020-03-11 DIAGNOSIS — Z483 Aftercare following surgery for neoplasm: Secondary | ICD-10-CM | POA: Diagnosis not present

## 2020-03-11 DIAGNOSIS — F17210 Nicotine dependence, cigarettes, uncomplicated: Secondary | ICD-10-CM | POA: Diagnosis not present

## 2020-03-11 DIAGNOSIS — Z438 Encounter for attention to other artificial openings: Secondary | ICD-10-CM | POA: Diagnosis not present

## 2020-03-11 DIAGNOSIS — J690 Pneumonitis due to inhalation of food and vomit: Secondary | ICD-10-CM | POA: Diagnosis not present

## 2020-03-11 DIAGNOSIS — I959 Hypotension, unspecified: Secondary | ICD-10-CM | POA: Diagnosis not present

## 2020-03-11 DIAGNOSIS — C329 Malignant neoplasm of larynx, unspecified: Secondary | ICD-10-CM | POA: Diagnosis not present

## 2020-03-11 DIAGNOSIS — C321 Malignant neoplasm of supraglottis: Secondary | ICD-10-CM | POA: Diagnosis not present

## 2020-03-11 DIAGNOSIS — H269 Unspecified cataract: Secondary | ICD-10-CM | POA: Diagnosis not present

## 2020-03-11 DIAGNOSIS — I10 Essential (primary) hypertension: Secondary | ICD-10-CM | POA: Diagnosis not present

## 2020-03-13 DIAGNOSIS — Z9002 Acquired absence of larynx: Secondary | ICD-10-CM | POA: Diagnosis not present

## 2020-03-13 DIAGNOSIS — R491 Aphonia: Secondary | ICD-10-CM | POA: Diagnosis not present

## 2020-03-13 DIAGNOSIS — C329 Malignant neoplasm of larynx, unspecified: Secondary | ICD-10-CM | POA: Diagnosis not present

## 2020-03-14 DIAGNOSIS — C321 Malignant neoplasm of supraglottis: Secondary | ICD-10-CM | POA: Diagnosis not present

## 2020-03-14 DIAGNOSIS — Z483 Aftercare following surgery for neoplasm: Secondary | ICD-10-CM | POA: Diagnosis not present

## 2020-03-14 DIAGNOSIS — C329 Malignant neoplasm of larynx, unspecified: Secondary | ICD-10-CM | POA: Diagnosis not present

## 2020-03-14 DIAGNOSIS — I959 Hypotension, unspecified: Secondary | ICD-10-CM | POA: Diagnosis not present

## 2020-03-14 DIAGNOSIS — F17210 Nicotine dependence, cigarettes, uncomplicated: Secondary | ICD-10-CM | POA: Diagnosis not present

## 2020-03-14 DIAGNOSIS — I10 Essential (primary) hypertension: Secondary | ICD-10-CM | POA: Diagnosis not present

## 2020-03-14 DIAGNOSIS — Z438 Encounter for attention to other artificial openings: Secondary | ICD-10-CM | POA: Diagnosis not present

## 2020-03-14 DIAGNOSIS — J690 Pneumonitis due to inhalation of food and vomit: Secondary | ICD-10-CM | POA: Diagnosis not present

## 2020-03-14 DIAGNOSIS — H269 Unspecified cataract: Secondary | ICD-10-CM | POA: Diagnosis not present

## 2020-03-17 DIAGNOSIS — E039 Hypothyroidism, unspecified: Secondary | ICD-10-CM | POA: Diagnosis not present

## 2020-03-17 DIAGNOSIS — C329 Malignant neoplasm of larynx, unspecified: Secondary | ICD-10-CM | POA: Diagnosis not present

## 2020-03-17 DIAGNOSIS — C321 Malignant neoplasm of supraglottis: Secondary | ICD-10-CM | POA: Diagnosis not present

## 2020-03-20 DIAGNOSIS — C321 Malignant neoplasm of supraglottis: Secondary | ICD-10-CM | POA: Diagnosis not present

## 2020-03-20 DIAGNOSIS — F17210 Nicotine dependence, cigarettes, uncomplicated: Secondary | ICD-10-CM | POA: Diagnosis not present

## 2020-03-20 DIAGNOSIS — Z483 Aftercare following surgery for neoplasm: Secondary | ICD-10-CM | POA: Diagnosis not present

## 2020-03-20 DIAGNOSIS — J690 Pneumonitis due to inhalation of food and vomit: Secondary | ICD-10-CM | POA: Diagnosis not present

## 2020-03-20 DIAGNOSIS — C329 Malignant neoplasm of larynx, unspecified: Secondary | ICD-10-CM | POA: Diagnosis not present

## 2020-03-20 DIAGNOSIS — H269 Unspecified cataract: Secondary | ICD-10-CM | POA: Diagnosis not present

## 2020-03-20 DIAGNOSIS — Z438 Encounter for attention to other artificial openings: Secondary | ICD-10-CM | POA: Diagnosis not present

## 2020-03-20 DIAGNOSIS — I10 Essential (primary) hypertension: Secondary | ICD-10-CM | POA: Diagnosis not present

## 2020-03-20 DIAGNOSIS — I959 Hypotension, unspecified: Secondary | ICD-10-CM | POA: Diagnosis not present

## 2020-03-21 DIAGNOSIS — Z9221 Personal history of antineoplastic chemotherapy: Secondary | ICD-10-CM | POA: Diagnosis not present

## 2020-03-21 DIAGNOSIS — R491 Aphonia: Secondary | ICD-10-CM | POA: Diagnosis not present

## 2020-03-21 DIAGNOSIS — Z9002 Acquired absence of larynx: Secondary | ICD-10-CM | POA: Diagnosis not present

## 2020-03-21 DIAGNOSIS — Z923 Personal history of irradiation: Secondary | ICD-10-CM | POA: Diagnosis not present

## 2020-03-21 DIAGNOSIS — C329 Malignant neoplasm of larynx, unspecified: Secondary | ICD-10-CM | POA: Diagnosis not present

## 2020-03-26 DIAGNOSIS — J690 Pneumonitis due to inhalation of food and vomit: Secondary | ICD-10-CM | POA: Diagnosis not present

## 2020-03-26 DIAGNOSIS — Z438 Encounter for attention to other artificial openings: Secondary | ICD-10-CM | POA: Diagnosis not present

## 2020-03-26 DIAGNOSIS — C321 Malignant neoplasm of supraglottis: Secondary | ICD-10-CM | POA: Diagnosis not present

## 2020-03-26 DIAGNOSIS — Z483 Aftercare following surgery for neoplasm: Secondary | ICD-10-CM | POA: Diagnosis not present

## 2020-03-26 DIAGNOSIS — I959 Hypotension, unspecified: Secondary | ICD-10-CM | POA: Diagnosis not present

## 2020-03-26 DIAGNOSIS — C329 Malignant neoplasm of larynx, unspecified: Secondary | ICD-10-CM | POA: Diagnosis not present

## 2020-03-26 DIAGNOSIS — I10 Essential (primary) hypertension: Secondary | ICD-10-CM | POA: Diagnosis not present

## 2020-03-26 DIAGNOSIS — H269 Unspecified cataract: Secondary | ICD-10-CM | POA: Diagnosis not present

## 2020-03-26 DIAGNOSIS — F17210 Nicotine dependence, cigarettes, uncomplicated: Secondary | ICD-10-CM | POA: Diagnosis not present

## 2020-03-28 DIAGNOSIS — J449 Chronic obstructive pulmonary disease, unspecified: Secondary | ICD-10-CM | POA: Diagnosis not present

## 2020-03-31 ENCOUNTER — Other Ambulatory Visit: Payer: Self-pay

## 2020-03-31 ENCOUNTER — Ambulatory Visit (INDEPENDENT_AMBULATORY_CARE_PROVIDER_SITE_OTHER): Payer: Medicare HMO | Admitting: Family Medicine

## 2020-03-31 ENCOUNTER — Encounter: Payer: Self-pay | Admitting: Family Medicine

## 2020-03-31 VITALS — BP 101/77 | HR 76 | Wt 185.2 lb

## 2020-03-31 DIAGNOSIS — Z963 Presence of artificial larynx: Secondary | ICD-10-CM | POA: Diagnosis not present

## 2020-03-31 DIAGNOSIS — E7800 Pure hypercholesterolemia, unspecified: Secondary | ICD-10-CM

## 2020-03-31 DIAGNOSIS — D649 Anemia, unspecified: Secondary | ICD-10-CM

## 2020-03-31 DIAGNOSIS — C329 Malignant neoplasm of larynx, unspecified: Secondary | ICD-10-CM

## 2020-03-31 DIAGNOSIS — E039 Hypothyroidism, unspecified: Secondary | ICD-10-CM | POA: Diagnosis not present

## 2020-03-31 DIAGNOSIS — E559 Vitamin D deficiency, unspecified: Secondary | ICD-10-CM | POA: Diagnosis not present

## 2020-03-31 DIAGNOSIS — I69359 Hemiplegia and hemiparesis following cerebral infarction affecting unspecified side: Secondary | ICD-10-CM | POA: Diagnosis not present

## 2020-03-31 DIAGNOSIS — Z8579 Personal history of other malignant neoplasms of lymphoid, hematopoietic and related tissues: Secondary | ICD-10-CM | POA: Diagnosis not present

## 2020-03-31 DIAGNOSIS — R7301 Impaired fasting glucose: Secondary | ICD-10-CM

## 2020-03-31 LAB — BAYER DCA HB A1C WAIVED: HB A1C (BAYER DCA - WAIVED): 5.6 % (ref <7.0)

## 2020-03-31 NOTE — Progress Notes (Signed)
BP 101/77   Pulse 76   Wt 185 lb 3.2 oz (84 kg)   SpO2 96%   BMI 25.12 kg/m    Subjective:    Patient ID: Luke [name], male    DOB: 24-Jul-1950, 70 y.o.   MRN: [id number]  HPI: Luke [name] is a 70 y.o. male  Chief Complaint  Patient presents with  . laryngeal cancer  . Hypothyroidism  . Hyperlipidemia   [name] has had a lot going on since last time he was here. He has been following with ENT and oncology for laryngeal cancer. He under went a resection at [facility] in January and has been undergoing chemotherapy. Following his surgery he had a stroke with R sided weakness. This has been improving. He did PT, but they discharged him because he has been doing so well. His last notes from 03/21/20 and 03/17/20 not available at this time on our system. Saw cardiology on 03/10/20 for a AAA. They advised him to follow up with vascular surgery.   HYPOTHYROIDISM Thyroid control status:stable Satisfied with current treatment? yes Medication side effects: no Medication compliance: excellent compliance Recent dose adjustment:no Fatigue: yes Cold intolerance: no Heat intolerance: no Weight gain: no Weight loss: no Constipation: no Diarrhea/loose stools: no Palpitations: no Lower extremity edema: no Anxiety/depressed mood: no  HYPERLIPIDEMIA Hyperlipidemia status: stable Satisfied with current treatment?  yes Side effects:  no Medication compliance: excellent compliance Past cholesterol meds: atorvastatin Supplements: none Aspirin:  yes The ASCVD Risk score Luke [name] DC Jr., et al., 2013) failed to calculate for the following reasons:   The patient has a prior MI or stroke diagnosis Chest pain:  no Coronary artery disease:  no  "Hospital Course: 1/24 - Day of surgery, stroke code called approximately 2200 due to RUE>RLE weakness  1/26 - Stable RUE weakness, gtube placement 1/27 - Restarted tube feeds and meds via tube 1/28 - EKG and troponins for chest pain - EKG without new  ischemia, mildly elevated troponins. Cardiology consulted - plan for outpatient stress test, discontinuation of IV BP prns, amlodipine 5 mg daily, candesartan 4 mg daily. Worked with PT, OT, SLP. 2/2 - R sided weakness significantly improving, working with PT, slowly advancing tube feeds, denies chest pain 2/8 - old right sided neck drain site with new fluctuance and purulent drainage, sent for culture and placed on unasyn 2/10 - unasyn switched to zosyn for pseudomonas coverage 2/11 - G to [place] exchange 2/12 - passed barium swallow with no leak, holding tube feeds, starting full liquid diet 2/14 - oralized antibiotics to ciprofloxacin based on culture sensitivities 2/15 - PT continues to work with patient, now recommending home health PT"   Able to eat again. Has been putting on weight. Feeling much better.   Relevant past medical, surgical, family and social history reviewed and updated as indicated. Interim medical history since our last visit reviewed. Allergies and medications reviewed and updated.  Review of Systems  Constitutional: Negative.   HENT: Positive for voice change. Negative for congestion, dental problem, drooling, ear discharge, ear pain, facial swelling, hearing loss, mouth sores, nosebleeds, postnasal drip, rhinorrhea, sinus pressure, sinus pain, sneezing, sore throat, tinnitus and trouble swallowing.   Respiratory: Negative.   Cardiovascular: Negative.   Gastrointestinal: Negative.   Musculoskeletal: Negative.   Psychiatric/Behavioral: Negative.     Per HPI unless specifically indicated above     Objective:    BP 101/77   Pulse 76   Wt 185 lb 3.2 oz (84 kg)  SpO2 96%   BMI 25.12 kg/m   Wt Readings from Last 3 Encounters:  03/31/20 185 lb 3.2 oz (84 kg)  03/10/20 177 lb (80.3 kg)  01/07/20 178 lb 3.2 oz (80.8 kg)    Physical Exam Vitals and nursing note reviewed.  Constitutional:      General: He is not in acute distress.    Appearance: Normal  appearance. He is not ill-appearing, toxic-appearing or diaphoretic.  HENT:     Head: Normocephalic and atraumatic.     Right Ear: External ear normal.     Left Ear: External ear normal.     Nose: Nose normal.     Mouth/Throat:     Mouth: Mucous membranes are moist.     Pharynx: Oropharynx is clear.  Eyes:     General: No scleral icterus.       Right eye: No discharge.        Left eye: No discharge.     Extraocular Movements: Extraocular movements intact.     Conjunctiva/sclera: Conjunctivae normal.     Pupils: Pupils are equal, round, and reactive to light.  Neck:     Comments: Artificial larynx in place.  Cardiovascular:     Rate and Rhythm: Normal rate and regular rhythm.     Pulses: Normal pulses.     Heart sounds: Normal heart sounds. No murmur heard. No friction rub. No gallop.   Pulmonary:     Effort: Pulmonary effort is normal. No respiratory distress.     Breath sounds: Normal breath sounds. No stridor. No wheezing, rhonchi or rales.  Chest:     Chest wall: No tenderness.  Musculoskeletal:        General: Normal range of motion.     Cervical back: Normal range of motion and neck supple.  Skin:    General: Skin is warm and dry.     Capillary Refill: Capillary refill takes less than 2 seconds.     Coloration: Skin is not jaundiced or pale.     Findings: No bruising, erythema, lesion or rash.  Neurological:     General: No focal deficit present.     Mental Status: He is alert and oriented to person, place, and time. Mental status is at baseline.  Psychiatric:        Mood and Affect: Mood normal.        Behavior: Behavior normal.        Thought Content: Thought content normal.        Judgment: Judgment normal.     Results for orders placed or performed in visit on 03/31/20  Comprehensive metabolic panel  Result Value Ref Range   Glucose 132 (H) 65 - 99 mg/dL   BUN 21 8 - 27 mg/dL   Creatinine, Ser 0.97 0.76 - 1.27 mg/dL   eGFR 86 >59 mL/min/1.73    BUN/Creatinine Ratio 22 10 - 24   Sodium 142 134 - 144 mmol/L   Potassium 4.2 3.5 - 5.2 mmol/L   Chloride 103 96 - 106 mmol/L   CO2 26 20 - 29 mmol/L   Calcium 9.5 8.6 - 10.2 mg/dL   Total Protein 6.2 6.0 - 8.5 g/dL   Albumin 4.0 3.8 - 4.8 g/dL   Globulin, Total 2.2 1.5 - 4.5 g/dL   Albumin/Globulin Ratio 1.8 1.2 - 2.2   Bilirubin Total 0.4 0.0 - 1.2 mg/dL   Alkaline Phosphatase 117 44 - 121 IU/L   AST 19 0 - 40 IU/L   ALT  12 0 - 44 IU/L  CBC with Differential/Platelet  Result Value Ref Range   WBC 6.3 3.4 - 10.8 x10E3/uL   RBC 3.75 (L) 4.14 - 5.80 x10E6/uL   Hemoglobin 11.2 (L) 13.0 - 17.7 g/dL   Hematocrit 33.8 (L) 37.5 - 51.0 %   MCV 90 79 - 97 fL   MCH 29.9 26.6 - 33.0 pg   MCHC 33.1 31.5 - 35.7 g/dL   RDW 13.5 11.6 - 15.4 %   Platelets 244 150 - 450 x10E3/uL   Neutrophils 67 Not Estab. %   Lymphs 20 Not Estab. %   Monocytes 7 Not Estab. %   Eos 5 Not Estab. %   Basos 1 Not Estab. %   Neutrophils Absolute 4.2 1.4 - 7.0 x10E3/uL   Lymphocytes Absolute 1.3 0.7 - 3.1 x10E3/uL   Monocytes Absolute 0.4 0.1 - 0.9 x10E3/uL   EOS (ABSOLUTE) 0.3 0.0 - 0.4 x10E3/uL   Basophils Absolute 0.0 0.0 - 0.2 x10E3/uL   Immature Granulocytes 0 Not Estab. %   Immature Grans (Abs) 0.0 0.0 - 0.1 x10E3/uL  Lipid Panel w/o Chol/HDL Ratio  Result Value Ref Range   Cholesterol, Total 144 100 - 199 mg/dL   Triglycerides 117 0 - 149 mg/dL   HDL 43 >39 mg/dL   VLDL Cholesterol Cal 21 5 - 40 mg/dL   LDL Chol Calc (NIH) 80 0 - 99 mg/dL  TSH  Result Value Ref Range   TSH 5.060 (H) 0.450 - 4.500 uIU/mL  VITAMIN D 25 Hydroxy (Vit-D Deficiency, Fractures)  Result Value Ref Range   Vit D, 25-Hydroxy 41.4 30.0 - 100.0 ng/mL  Bayer DCA Hb A1c Waived  Result Value Ref Range   HB A1C (BAYER DCA - WAIVED) 5.6 <7.0 %      Assessment & Plan:   Problem List Items Addressed This Visit      Respiratory   Laryngeal carcinoma ([place]) - Primary    S/p resection. Continue to follow with ENT and oncology.  Call with any cocerns.       Relevant Orders   Comprehensive metabolic panel (Completed)   CBC with Differential/Platelet (Completed)     Endocrine   Hypothyroidism    Rechecking levels today. Await results. Treat as needed.       Relevant Orders   Comprehensive metabolic panel (Completed)   TSH (Completed)     Nervous and Auditory   Hemiplegia, dominant side S/P CVA (cerebrovascular accident) ([place])    Doing well. Has been discharged from PT. Continue to monitor. Call with any concerns.         Other   Pure hypercholesterolemia    Under good control on current regimen. Continue current regimen. Continue to monitor. Call with any concerns. Refills up to date. Labs drawn today.        Relevant Orders   Comprehensive metabolic panel (Completed)   Lipid Panel w/o Chol/HDL Ratio (Completed)   Presence of artificial larynx    Doing well. Continue to follow with ENT. Call with any concerns .       Other Visit Diagnoses    IFG (impaired fasting glucose)       Checking labs today. Await results. Treat as needed.   Relevant Orders   Comprehensive metabolic panel (Completed)   Bayer DCA Hb A1c Waived (Completed)   Vitamin D deficiency       Checking labs today. Await results. Treat as needed.    Relevant Orders   VITAMIN D 25  Hydroxy (Vit-D Deficiency, Fractures) (Completed)       Follow up plan: Return in about 3 months (around 07/01/2020).

## 2020-04-01 ENCOUNTER — Encounter: Payer: Self-pay | Admitting: Family Medicine

## 2020-04-01 DIAGNOSIS — I69359 Hemiplegia and hemiparesis following cerebral infarction affecting unspecified side: Secondary | ICD-10-CM | POA: Insufficient documentation

## 2020-04-01 DIAGNOSIS — C329 Malignant neoplasm of larynx, unspecified: Secondary | ICD-10-CM | POA: Diagnosis not present

## 2020-04-01 DIAGNOSIS — Z930 Tracheostomy status: Secondary | ICD-10-CM | POA: Diagnosis not present

## 2020-04-01 LAB — CBC WITH DIFFERENTIAL/PLATELET
Basophils Absolute: 0 10*3/uL (ref 0.0–0.2)
Basos: 1 %
EOS (ABSOLUTE): 0.3 10*3/uL (ref 0.0–0.4)
Eos: 5 %
Hematocrit: 33.8 % — ABNORMAL LOW (ref 37.5–51.0)
Hemoglobin: 11.2 g/dL — ABNORMAL LOW (ref 13.0–17.7)
Immature Grans (Abs): 0 10*3/uL (ref 0.0–0.1)
Immature Granulocytes: 0 %
Lymphocytes Absolute: 1.3 10*3/uL (ref 0.7–3.1)
Lymphs: 20 %
MCH: 29.9 pg (ref 26.6–33.0)
MCHC: 33.1 g/dL (ref 31.5–35.7)
MCV: 90 fL (ref 79–97)
Monocytes Absolute: 0.4 10*3/uL (ref 0.1–0.9)
Monocytes: 7 %
Neutrophils Absolute: 4.2 10*3/uL (ref 1.4–7.0)
Neutrophils: 67 %
Platelets: 244 10*3/uL (ref 150–450)
RBC: 3.75 x10E6/uL — ABNORMAL LOW (ref 4.14–5.80)
RDW: 13.5 % (ref 11.6–15.4)
WBC: 6.3 10*3/uL (ref 3.4–10.8)

## 2020-04-01 LAB — COMPREHENSIVE METABOLIC PANEL
ALT: 12 IU/L (ref 0–44)
AST: 19 IU/L (ref 0–40)
Albumin/Globulin Ratio: 1.8 (ref 1.2–2.2)
Albumin: 4 g/dL (ref 3.8–4.8)
Alkaline Phosphatase: 117 IU/L (ref 44–121)
BUN/Creatinine Ratio: 22 (ref 10–24)
BUN: 21 mg/dL (ref 8–27)
Bilirubin Total: 0.4 mg/dL (ref 0.0–1.2)
CO2: 26 mmol/L (ref 20–29)
Calcium: 9.5 mg/dL (ref 8.6–10.2)
Chloride: 103 mmol/L (ref 96–106)
Creatinine, Ser: 0.97 mg/dL (ref 0.76–1.27)
Globulin, Total: 2.2 g/dL (ref 1.5–4.5)
Glucose: 132 mg/dL — ABNORMAL HIGH (ref 65–99)
Potassium: 4.2 mmol/L (ref 3.5–5.2)
Sodium: 142 mmol/L (ref 134–144)
Total Protein: 6.2 g/dL (ref 6.0–8.5)
eGFR: 86 mL/min/{1.73_m2} (ref >59)

## 2020-04-01 LAB — LIPID PANEL W/O CHOL/HDL RATIO
Cholesterol, Total: 144 mg/dL (ref 100–199)
HDL: 43 mg/dL (ref >39)
LDL Chol Calc (NIH): 80 mg/dL (ref 0–99)
Triglycerides: 117 mg/dL (ref 0–149)
VLDL Cholesterol Cal: 21 mg/dL (ref 5–40)

## 2020-04-01 LAB — TSH: TSH: 5.06 u[IU]/mL — ABNORMAL HIGH (ref 0.450–4.500)

## 2020-04-01 LAB — VITAMIN D 25 HYDROXY (VIT D DEFICIENCY, FRACTURES): Vit D, 25-Hydroxy: 41.4 ng/mL (ref 30.0–100.0)

## 2020-04-01 NOTE — Assessment & Plan Note (Signed)
Doing well. Has been discharged from PT. Continue to monitor. Call with any concerns.

## 2020-04-01 NOTE — Assessment & Plan Note (Signed)
S/p resection. Continue to follow with ENT and oncology. Call with any cocerns.

## 2020-04-01 NOTE — Assessment & Plan Note (Signed)
Under good control on current regimen. Continue current regimen. Continue to monitor. Call with any concerns. Refills up to date. Labs drawn today.  

## 2020-04-01 NOTE — Assessment & Plan Note (Signed)
Rechecking levels today. Await results. Treat as needed.  

## 2020-04-01 NOTE — Assessment & Plan Note (Signed)
Doing well. Continue to follow with ENT. Call with any concerns .

## 2020-04-07 ENCOUNTER — Other Ambulatory Visit: Payer: Self-pay | Admitting: Family Medicine

## 2020-04-07 DIAGNOSIS — Z8579 Personal history of other malignant neoplasms of lymphoid, hematopoietic and related tissues: Secondary | ICD-10-CM

## 2020-04-07 MED ORDER — LEVOTHYROXINE SODIUM 100 MCG PO TABS: 100.0000 ug | ORAL_TABLET | Freq: Every day | ORAL | 1 refills | Status: DC

## 2020-04-07 NOTE — Addendum Note (Signed)
Addended by: Valerie Roys on: 04/07/2020 04:20 PM   Modules accepted: Orders

## 2020-04-07 NOTE — Progress Notes (Signed)
Please let him know that his thyroid is off a little bit and he is a bit anemic. I want to recheck both in 6 weeks. He should start taking 125mcg on the thyroid medicine that I sent to his pharmacy (orders in)

## 2020-04-18 DIAGNOSIS — R491 Aphonia: Secondary | ICD-10-CM | POA: Diagnosis not present

## 2020-04-18 DIAGNOSIS — Z923 Personal history of irradiation: Secondary | ICD-10-CM | POA: Diagnosis not present

## 2020-04-18 DIAGNOSIS — Z9002 Acquired absence of larynx: Secondary | ICD-10-CM | POA: Diagnosis not present

## 2020-04-18 DIAGNOSIS — C329 Malignant neoplasm of larynx, unspecified: Secondary | ICD-10-CM | POA: Diagnosis not present

## 2020-04-18 DIAGNOSIS — Z9221 Personal history of antineoplastic chemotherapy: Secondary | ICD-10-CM | POA: Diagnosis not present

## 2020-04-26 DIAGNOSIS — C329 Malignant neoplasm of larynx, unspecified: Secondary | ICD-10-CM | POA: Diagnosis not present

## 2020-04-28 DIAGNOSIS — J449 Chronic obstructive pulmonary disease, unspecified: Secondary | ICD-10-CM | POA: Diagnosis not present

## 2020-05-14 DIAGNOSIS — Z9002 Acquired absence of larynx: Secondary | ICD-10-CM | POA: Diagnosis not present

## 2020-05-14 DIAGNOSIS — Z923 Personal history of irradiation: Secondary | ICD-10-CM | POA: Diagnosis not present

## 2020-05-19 ENCOUNTER — Other Ambulatory Visit: Payer: Medicare HMO

## 2020-05-19 ENCOUNTER — Other Ambulatory Visit: Payer: Self-pay

## 2020-05-19 DIAGNOSIS — D649 Anemia, unspecified: Secondary | ICD-10-CM | POA: Diagnosis not present

## 2020-05-19 DIAGNOSIS — E7800 Pure hypercholesterolemia, unspecified: Secondary | ICD-10-CM

## 2020-05-19 DIAGNOSIS — E039 Hypothyroidism, unspecified: Secondary | ICD-10-CM

## 2020-05-20 ENCOUNTER — Other Ambulatory Visit: Payer: Self-pay | Admitting: Family Medicine

## 2020-05-20 DIAGNOSIS — Z8579 Personal history of other malignant neoplasms of lymphoid, hematopoietic and related tissues: Secondary | ICD-10-CM

## 2020-05-20 LAB — TSH: TSH: 2.44 u[IU]/mL (ref 0.450–4.500)

## 2020-05-20 LAB — CBC WITH DIFFERENTIAL/PLATELET
Basophils Absolute: 0 10*3/uL (ref 0.0–0.2)
Basos: 1 %
EOS (ABSOLUTE): 0.3 10*3/uL (ref 0.0–0.4)
Eos: 6 %
Hematocrit: 39.8 % (ref 37.5–51.0)
Hemoglobin: 12.8 g/dL — ABNORMAL LOW (ref 13.0–17.7)
Immature Grans (Abs): 0 10*3/uL (ref 0.0–0.1)
Immature Granulocytes: 0 %
Lymphocytes Absolute: 1.2 10*3/uL (ref 0.7–3.1)
Lymphs: 20 %
MCH: 29.7 pg (ref 26.6–33.0)
MCHC: 32.2 g/dL (ref 31.5–35.7)
MCV: 92 fL (ref 79–97)
Monocytes Absolute: 0.5 10*3/uL (ref 0.1–0.9)
Monocytes: 8 %
Neutrophils Absolute: 3.8 10*3/uL (ref 1.4–7.0)
Neutrophils: 65 %
Platelets: 246 10*3/uL (ref 150–450)
RBC: 4.31 x10E6/uL (ref 4.14–5.80)
RDW: 13.1 % (ref 11.6–15.4)
WBC: 5.8 10*3/uL (ref 3.4–10.8)

## 2020-05-20 MED ORDER — LEVOTHYROXINE SODIUM 100 MCG PO TABS
100.0000 ug | ORAL_TABLET | Freq: Every day | ORAL | 3 refills | Status: DC
Start: 2020-05-20 — End: 2021-06-29

## 2020-05-26 DIAGNOSIS — C329 Malignant neoplasm of larynx, unspecified: Secondary | ICD-10-CM | POA: Diagnosis not present

## 2020-05-28 DIAGNOSIS — J449 Chronic obstructive pulmonary disease, unspecified: Secondary | ICD-10-CM | POA: Diagnosis not present

## 2020-05-29 DIAGNOSIS — R491 Aphonia: Secondary | ICD-10-CM | POA: Diagnosis not present

## 2020-05-29 DIAGNOSIS — Z9002 Acquired absence of larynx: Secondary | ICD-10-CM | POA: Diagnosis not present

## 2020-06-11 DIAGNOSIS — Z923 Personal history of irradiation: Secondary | ICD-10-CM | POA: Diagnosis not present

## 2020-06-11 DIAGNOSIS — Z8521 Personal history of malignant neoplasm of larynx: Secondary | ICD-10-CM | POA: Diagnosis not present

## 2020-06-11 DIAGNOSIS — Z9002 Acquired absence of larynx: Secondary | ICD-10-CM | POA: Diagnosis not present

## 2020-06-11 DIAGNOSIS — Z87891 Personal history of nicotine dependence: Secondary | ICD-10-CM | POA: Diagnosis not present

## 2020-06-11 DIAGNOSIS — T8141XA Infection following a procedure, superficial incisional surgical site, initial encounter: Secondary | ICD-10-CM | POA: Diagnosis not present

## 2020-06-16 ENCOUNTER — Other Ambulatory Visit: Payer: Self-pay | Admitting: Family Medicine

## 2020-06-16 DIAGNOSIS — Z8579 Personal history of other malignant neoplasms of lymphoid, hematopoietic and related tissues: Secondary | ICD-10-CM

## 2020-06-16 DIAGNOSIS — K219 Gastro-esophageal reflux disease without esophagitis: Secondary | ICD-10-CM

## 2020-06-26 DIAGNOSIS — C329 Malignant neoplasm of larynx, unspecified: Secondary | ICD-10-CM | POA: Diagnosis not present

## 2020-06-28 DIAGNOSIS — J449 Chronic obstructive pulmonary disease, unspecified: Secondary | ICD-10-CM | POA: Diagnosis not present

## 2020-06-30 DIAGNOSIS — Z0100 Encounter for examination of eyes and vision without abnormal findings: Secondary | ICD-10-CM | POA: Diagnosis not present

## 2020-06-30 DIAGNOSIS — H40023 Open angle with borderline findings, high risk, bilateral: Secondary | ICD-10-CM | POA: Diagnosis not present

## 2020-06-30 DIAGNOSIS — Z961 Presence of intraocular lens: Secondary | ICD-10-CM | POA: Diagnosis not present

## 2020-06-30 DIAGNOSIS — H4423 Degenerative myopia, bilateral: Secondary | ICD-10-CM | POA: Diagnosis not present

## 2020-07-01 ENCOUNTER — Other Ambulatory Visit: Payer: Self-pay

## 2020-07-01 ENCOUNTER — Encounter: Payer: Self-pay | Admitting: Family Medicine

## 2020-07-01 ENCOUNTER — Ambulatory Visit (INDEPENDENT_AMBULATORY_CARE_PROVIDER_SITE_OTHER): Payer: Medicare HMO | Admitting: Family Medicine

## 2020-07-01 DIAGNOSIS — E7800 Pure hypercholesterolemia, unspecified: Secondary | ICD-10-CM | POA: Diagnosis not present

## 2020-07-01 DIAGNOSIS — Z8579 Personal history of other malignant neoplasms of lymphoid, hematopoietic and related tissues: Secondary | ICD-10-CM | POA: Diagnosis not present

## 2020-07-01 DIAGNOSIS — K219 Gastro-esophageal reflux disease without esophagitis: Secondary | ICD-10-CM

## 2020-07-01 MED ORDER — OMEPRAZOLE 20 MG PO CPDR: DELAYED_RELEASE_CAPSULE | ORAL | 1 refills | Status: DC

## 2020-07-01 MED ORDER — MELOXICAM 15 MG PO TABS: 1.0000 | ORAL_TABLET | Freq: Every day | ORAL | 1 refills | Status: DC

## 2020-07-01 MED ORDER — ATORVASTATIN CALCIUM 40 MG PO TABS: 40.0000 mg | ORAL_TABLET | Freq: Every day | ORAL | 1 refills | Status: DC

## 2020-07-01 NOTE — Assessment & Plan Note (Signed)
Under good control on current regimen. Continue current regimen. Continue to monitor. Call with any concerns. Refills given.   

## 2020-07-01 NOTE — Progress Notes (Signed)
BP 117/71   Pulse (!) 59   Wt 204 lb (92.5 kg)   SpO2 96%   BMI 27.67 kg/m    Subjective:    Patient ID: Luke [name], male    DOB: October 25, 1950, 70 y.o.   MRN: [id number]  HPI: Luke [name] is a 70 y.o. male  Chief Complaint  Patient presents with  . Gastroesophageal Reflux  . Hyperlipidemia    Everything has been going well with ENT. He is gaining weight back. He is feeling more like himself.   HYPERLIPIDEMIA Hyperlipidemia status: excellent compliance Satisfied with current treatment?  yes Side effects:  no Medication compliance: excellent compliance Past cholesterol meds: atorvastain (lipitor) Supplements: none Aspirin:  no The ASCVD Risk score Mikey Bussing DC Jr., et al., 2013) failed to calculate for the following reasons:   The patient has a prior MI or stroke diagnosis Chest pain:  no Coronary artery disease:  no  GERD GERD control status: controlled Satisfied with current treatment? yes Medication side effects: no  Medication compliance: excellent Dysphagia: no Odynophagia:  no Hematemesis: no Blood in stool: no EGD: no   Relevant past medical, surgical, family and social history reviewed and updated as indicated. Interim medical history since our last visit reviewed. Allergies and medications reviewed and updated.  Review of Systems  Constitutional: Negative.   Respiratory: Negative.    Cardiovascular: Negative.   Gastrointestinal: Negative.   Musculoskeletal: Negative.   Psychiatric/Behavioral: Negative.     Per HPI unless specifically indicated above     Objective:    BP 117/71   Pulse (!) 59   Wt 204 lb (92.5 kg)   SpO2 96%   BMI 27.67 kg/m   Wt Readings from Last 3 Encounters:  07/01/20 204 lb (92.5 kg)  03/31/20 185 lb 3.2 oz (84 kg)  03/10/20 177 lb (80.3 kg)    Physical Exam Vitals and nursing note reviewed.  Constitutional:      General: He is not in acute distress.    Appearance: Normal appearance. He is not ill-appearing,  toxic-appearing or diaphoretic.     Comments: Artificial larynx in place  HENT:     Head: Normocephalic and atraumatic.     Right Ear: External ear normal.     Left Ear: External ear normal.     Nose: Nose normal.     Mouth/Throat:     Mouth: Mucous membranes are moist.     Pharynx: Oropharynx is clear.  Eyes:     General: No scleral icterus.       Right eye: No discharge.        Left eye: No discharge.     Extraocular Movements: Extraocular movements intact.     Conjunctiva/sclera: Conjunctivae normal.     Pupils: Pupils are equal, round, and reactive to light.  Cardiovascular:     Rate and Rhythm: Normal rate and regular rhythm.     Pulses: Normal pulses.     Heart sounds: Normal heart sounds. No murmur heard.   No friction rub. No gallop.  Pulmonary:     Effort: Pulmonary effort is normal. No respiratory distress.     Breath sounds: Normal breath sounds. No stridor. No wheezing, rhonchi or rales.  Chest:     Chest wall: No tenderness.  Musculoskeletal:        General: Normal range of motion.     Cervical back: Normal range of motion and neck supple.  Skin:    General: Skin is warm and  dry.     Capillary Refill: Capillary refill takes less than 2 seconds.     Coloration: Skin is not jaundiced or pale.     Findings: No bruising, erythema, lesion or rash.  Neurological:     General: No focal deficit present.     Mental Status: He is alert and oriented to person, place, and time. Mental status is at baseline.  Psychiatric:        Mood and Affect: Mood normal.        Behavior: Behavior normal.        Thought Content: Thought content normal.        Judgment: Judgment normal.    Results for orders placed or performed in visit on 05/19/20  TSH  Result Value Ref Range   TSH 2.440 0.450 - 4.500 uIU/mL  CBC with Differential/Platelet  Result Value Ref Range   WBC 5.8 3.4 - 10.8 x10E3/uL   RBC 4.31 4.14 - 5.80 x10E6/uL   Hemoglobin 12.8 (L) 13.0 - 17.7 g/dL   Hematocrit  39.8 37.5 - 51.0 %   MCV 92 79 - 97 fL   MCH 29.7 26.6 - 33.0 pg   MCHC 32.2 31.5 - 35.7 g/dL   RDW 13.1 11.6 - 15.4 %   Platelets 246 150 - 450 x10E3/uL   Neutrophils 65 Not Estab. %   Lymphs 20 Not Estab. %   Monocytes 8 Not Estab. %   Eos 6 Not Estab. %   Basos 1 Not Estab. %   Neutrophils Absolute 3.8 1.4 - 7.0 x10E3/uL   Lymphocytes Absolute 1.2 0.7 - 3.1 x10E3/uL   Monocytes Absolute 0.5 0.1 - 0.9 x10E3/uL   EOS (ABSOLUTE) 0.3 0.0 - 0.4 x10E3/uL   Basophils Absolute 0.0 0.0 - 0.2 x10E3/uL   Immature Granulocytes 0 Not Estab. %   Immature Grans (Abs) 0.0 0.0 - 0.1 x10E3/uL      Assessment & Plan:   Problem List Items Addressed This Visit       Digestive   Gastroesophageal reflux disease    Under good control on current regimen. Continue current regimen. Continue to monitor. Call with any concerns. Refills given.         Relevant Medications   omeprazole (PRILOSEC) 20 MG capsule     Other   H/O lymphoma   Relevant Medications   meloxicam (MOBIC) 15 MG tablet   Pure hypercholesterolemia    Under good control on current regimen. Continue current regimen. Continue to monitor. Call with any concerns. Refills given.         Relevant Medications   atorvastatin (LIPITOR) 40 MG tablet     Follow up plan: Return in about 6 months (around 12/31/2020) for physical.

## 2020-07-16 ENCOUNTER — Other Ambulatory Visit: Payer: Self-pay | Admitting: Family Medicine

## 2020-07-16 DIAGNOSIS — Z8579 Personal history of other malignant neoplasms of lymphoid, hematopoietic and related tissues: Secondary | ICD-10-CM

## 2020-07-23 DIAGNOSIS — R58 Hemorrhage, not elsewhere classified: Secondary | ICD-10-CM | POA: Diagnosis not present

## 2020-07-23 DIAGNOSIS — Z8579 Personal history of other malignant neoplasms of lymphoid, hematopoietic and related tissues: Secondary | ICD-10-CM | POA: Diagnosis not present

## 2020-07-23 DIAGNOSIS — C329 Malignant neoplasm of larynx, unspecified: Secondary | ICD-10-CM | POA: Diagnosis not present

## 2020-07-23 DIAGNOSIS — Z9221 Personal history of antineoplastic chemotherapy: Secondary | ICD-10-CM | POA: Diagnosis not present

## 2020-07-23 DIAGNOSIS — R490 Dysphonia: Secondary | ICD-10-CM | POA: Diagnosis not present

## 2020-07-23 DIAGNOSIS — Z08 Encounter for follow-up examination after completed treatment for malignant neoplasm: Secondary | ICD-10-CM | POA: Diagnosis not present

## 2020-07-23 DIAGNOSIS — Z8521 Personal history of malignant neoplasm of larynx: Secondary | ICD-10-CM | POA: Diagnosis not present

## 2020-07-23 DIAGNOSIS — K1379 Other lesions of oral mucosa: Secondary | ICD-10-CM | POA: Diagnosis not present

## 2020-07-23 DIAGNOSIS — Z963 Presence of artificial larynx: Secondary | ICD-10-CM | POA: Diagnosis not present

## 2020-07-23 DIAGNOSIS — Z9002 Acquired absence of larynx: Secondary | ICD-10-CM | POA: Diagnosis not present

## 2020-07-23 DIAGNOSIS — Z923 Personal history of irradiation: Secondary | ICD-10-CM | POA: Diagnosis not present

## 2020-07-26 DIAGNOSIS — C329 Malignant neoplasm of larynx, unspecified: Secondary | ICD-10-CM | POA: Diagnosis not present

## 2020-07-28 DIAGNOSIS — J449 Chronic obstructive pulmonary disease, unspecified: Secondary | ICD-10-CM | POA: Diagnosis not present

## 2020-08-26 DIAGNOSIS — C329 Malignant neoplasm of larynx, unspecified: Secondary | ICD-10-CM | POA: Diagnosis not present

## 2020-08-26 DIAGNOSIS — J9504 Tracheo-esophageal fistula following tracheostomy: Secondary | ICD-10-CM | POA: Diagnosis not present

## 2020-08-28 DIAGNOSIS — J449 Chronic obstructive pulmonary disease, unspecified: Secondary | ICD-10-CM | POA: Diagnosis not present

## 2020-08-30 ENCOUNTER — Other Ambulatory Visit: Payer: Self-pay | Admitting: Family Medicine

## 2020-08-30 DIAGNOSIS — Z8579 Personal history of other malignant neoplasms of lymphoid, hematopoietic and related tissues: Secondary | ICD-10-CM

## 2020-08-30 NOTE — Telephone Encounter (Signed)
dc'd 04/07/20 reorder Dr [name]

## 2020-09-08 ENCOUNTER — Other Ambulatory Visit: Payer: Self-pay

## 2020-09-08 ENCOUNTER — Ambulatory Visit: Payer: Medicare HMO | Admitting: Cardiology

## 2020-09-08 ENCOUNTER — Encounter: Payer: Self-pay | Admitting: Cardiology

## 2020-09-08 VITALS — BP 108/84 | HR 65 | Ht 72.0 in | Wt 204.5 lb

## 2020-09-08 DIAGNOSIS — I712 Thoracic aortic aneurysm, without rupture, unspecified: Secondary | ICD-10-CM

## 2020-09-08 DIAGNOSIS — E7800 Pure hypercholesterolemia, unspecified: Secondary | ICD-10-CM | POA: Diagnosis not present

## 2020-09-08 DIAGNOSIS — I714 Abdominal aortic aneurysm, without rupture, unspecified: Secondary | ICD-10-CM

## 2020-09-08 NOTE — Progress Notes (Signed)
Cardiology Office Note:    Date:  09/08/2020   ID:  Luke [name], DOB 1950-04-16, MRN [id number]  PCP:  Valerie Roys, DO  Cardiologist:  Kate Sable, MD  Electrophysiologist:  None   Referring MD: Valerie Roys, DO   Chief Complaint  Patient presents with   Other    6 month f/u no complaints today. Meds reviewed verbally with pt.     History of Present Illness:    Luke [name] is a 70 y.o. male with a hx of anxiety, COPD, abdominal aortic aneurysm (4.4cm infra renal CT 06/2019), mild thoracic aortic aneurysm 4.1 cm (CT 06/2019), recent throat cancer s/p resection at [place] (01/2020), CVA 01/2020, former smoker x50+ years who presents for follow-up.    Being seen for hyperlipidemia, aortic aneurysm.  Follows up with vascular surgery for abdominal aneurysm.  Advised to obtain fasting lipid profile after Lipitor was started.  He takes all his medications as prescribed.  Denies chest pain or shortness of breath.  Has some weakness on his right side/right leg resulting from his stroke back in 01/2020.  He otherwise feels well, has no complaints at this time.   Prior notes Echo [id number] normal systolic function, impaired relaxation, EF 55 to 60%, mild ascending aorta dilatation, 42 mm. Ultrasound lower extremity arterial 05/31/2019.  No occlusive disease, abdominal aneurysm, iliac artery aneurysm bilaterally. Lexiscan Myoview 04/2019, low risk study, no evidence of ischemia.  Past Medical History:  Diagnosis Date   Anxiety    Arthritis    Carcinoma metastatic to lymph node (HCC)    Cataract    right   COPD (chronic obstructive pulmonary disease) (HCC)    Depression    GERD (gastroesophageal reflux disease)    Hoarseness of voice    Personal history of tobacco use, presenting hazards to health February of 2001   Thyroid disease     Past Surgical History:  Procedure Laterality Date   BALLOON DILATION N/A 01/30/2018   Procedure: BALLOON DILATION;  Surgeon: Mauri Pole, MD;  Location: [facility] ENDOSCOPY;  Service: Endoscopy;  Laterality: N/A;   COLONOSCOPY     COLONOSCOPY WITH PROPOFOL N/A 11/29/2016   Procedure: COLONOSCOPY WITH PROPOFOL;  Surgeon: Manya Silvas, MD;  Location: [facility] ENDOSCOPY;  Service: Endoscopy;  Laterality: N/A;   COLONOSCOPY WITH PROPOFOL N/A 08/22/2017   Procedure: COLONOSCOPY WITH PROPOFOL;  Surgeon: Jonathon Bellows, MD;  Location: [facility] ENDOSCOPY;  Service: Gastroenterology;  Laterality: N/A;   ESOPHAGEAL MANOMETRY N/A 10/05/2017   Procedure: ESOPHAGEAL MANOMETRY (EM);  Surgeon: Jonathon Bellows, MD;  Location: [facility] ENDOSCOPY;  Service: Gastroenterology;  Laterality: N/A;   ESOPHAGEAL MANOMETRY N/A 01/30/2018   Procedure: ESOPHAGEAL MANOMETRY (EM);  Surgeon: Mauri Pole, MD;  Location: [facility] ENDOSCOPY;  Service: Endoscopy;  Laterality: N/A;   ESOPHAGOGASTRODUODENOSCOPY N/A 03/09/2018   Procedure: ESOPHAGOGASTRODUODENOSCOPY (EGD);  Surgeon: Milus Banister, MD;  Location: [facility] ENDOSCOPY;  Service: Endoscopy;  Laterality: N/A;   ESOPHAGOGASTRODUODENOSCOPY (EGD) WITH PROPOFOL N/A 11/29/2016   Procedure: ESOPHAGOGASTRODUODENOSCOPY (EGD) WITH PROPOFOL;  Surgeon: Manya Silvas, MD;  Location: [facility] ENDOSCOPY;  Service: Endoscopy;  Laterality: N/A;   ESOPHAGOGASTRODUODENOSCOPY (EGD) WITH PROPOFOL N/A 08/22/2017   Procedure: ESOPHAGOGASTRODUODENOSCOPY (EGD) WITH PROPOFOL;  Surgeon: Jonathon Bellows, MD;  Location: [facility] ENDOSCOPY;  Service: Gastroenterology;  Laterality: N/A;   ESOPHAGOGASTRODUODENOSCOPY (EGD) WITH PROPOFOL N/A 01/30/2018   Procedure: ESOPHAGOGASTRODUODENOSCOPY (EGD) WITH PROPOFOL;  Surgeon: Mauri Pole, MD;  [facility] ENDOSCOPY;  Service: Endoscopy;  Laterality: N/A;  Mano probe to  be placed during EGD   EUS N/A 03/09/2018   Procedure: UPPER ENDOSCOPIC ULTRASOUND (EUS) RADIAL;  Surgeon: Milus Banister, MD;  Location: [facility] ENDOSCOPY;  Service: Endoscopy;  Laterality: N/A;   NECK SURGERY     status post diagnosis of cancer  with chemo and radiation   SUBMUCOSAL INJECTION  03/09/2018   Procedure: SUBMUCOSAL INJECTION;  Surgeon: Milus Banister, MD;  Location: [facility] ENDOSCOPY;  Service: Endoscopy;;   TONSILLECTOMY     TOTAL LARYNGECTOMY  01/31/2020    Current Medications: Current Meds  Medication Sig   aspirin EC 81 MG tablet Take 1 tablet (81 mg total) by mouth daily. Swallow whole.   atorvastatin (LIPITOR) 40 MG tablet Take 1 tablet (40 mg total) by mouth daily.   Cyanocobalamin (B-12 PO) Take by mouth daily in the afternoon.   levothyroxine (SYNTHROID) 100 MCG tablet Take 1 tablet (100 mcg total) by mouth daily before breakfast.   meloxicam (MOBIC) 15 MG tablet Take 1 tablet (15 mg total) by mouth daily.   Multiple Vitamin (MULTIVITAMIN WITH MINERALS) TABS tablet Take 1 tablet by mouth daily. One-A-Day Multivitamin   omeprazole (PRILOSEC) 20 MG capsule TAKE 1 CAPSULE DAILY BEFORE BREAKFAST   sucralfate (CARAFATE) 1 g tablet Take 1 g by mouth once.   Vitamin D, Ergocalciferol, (DRISDOL) 1.25 MG (50000 UNIT) CAPS capsule Take 1 capsule (50,000 Units total) by mouth every 7 (seven) days.   vitamin E 180 MG (400 UNITS) capsule Take 400 Units by mouth daily.   zinc sulfate 220 (50 Zn) MG capsule Take 220 mg by mouth daily.     Allergies:   Patient has no known allergies.   Social History   Socioeconomic History   Marital status: Single    Spouse name: Not on file   Number of children: 1   Years of education: High School   Highest education level: High school graduate  Occupational History   Not on file  Tobacco Use   Smoking status: Former    Years: 51.00    Types: Cigarettes    Start date: 1970   Smokeless tobacco: Former   Tobacco comments:    05/03/19- down to half a pack or less / day  Vaping Use   Vaping Use: Never used  Substance and Sexual Activity   Alcohol use: No   Drug use: No   Sexual activity: Not Currently  Other Topics Concern   Not on file  Social History Narrative   Not on  file   Social Determinants of Health   Financial Resource Strain: Low Risk    Difficulty of Paying Living Expenses: Not hard at all  Food Insecurity: No Food Insecurity   Worried About Charity fundraiser in the Last Year: Never true   [place] in the Last Year: Never true  Transportation Needs: No Transportation Needs   Lack of Transportation (Medical): No   Lack of Transportation (Non-Medical): No  Physical Activity: Sufficiently Active   Days of Exercise per Week: 7 days   Minutes of Exercise per Session: 30 min  Stress: No Stress Concern Present   Feeling of Stress : Not at all  Social Connections: Not on file     Family History: The patient's family history includes Breast cancer in his mother; Cancer in his mother; Heart attack in his father; Heart disease in his father and mother.  ROS:   Please see the history of present illness.     All other  systems reviewed and are negative.  EKGs/Labs/Other Studies Reviewed:    The following studies were reviewed today:   EKG:  EKG is  ordered today.  The ekg ordered today demonstrates normal sinus rhythm, normal ECG.  Recent Labs: 03/31/2020: ALT 12; BUN 21; Creatinine, Ser 0.97; Potassium 4.2; Sodium 142 05/19/2020: Hemoglobin 12.8; Platelets 246; TSH 2.440  Recent Lipid Panel    Component Value Date/Time   CHOL 144 03/31/2020 1516   TRIG 117 03/31/2020 1516   HDL 43 03/31/2020 1516   CHOLHDL 4.9 02/09/2019 1003   LDLCALC 80 03/31/2020 1516   LDLCALC 131 (H) 02/09/2019 1003    Physical Exam:    VS:  BP 108/84 (BP Location: Left Arm, Patient Position: Sitting, Cuff Size: Normal)   Pulse 65   Ht 6' (1.829 m)   Wt 204 lb 8 oz (92.8 kg)   SpO2 98%   BMI 27.74 kg/m     Wt Readings from Last 3 Encounters:  09/08/20 204 lb 8 oz (92.8 kg)  07/01/20 204 lb (92.5 kg)  03/31/20 185 lb 3.2 oz (84 kg)     GEN:  Well nourished, well developed in no acute distress, slow speech HEENT: S/p laryngeal cancer resection,  uses artificial larynx NECK: No JVD; tracheostomy noted. LYMPHATICS: No lymphadenopathy CARDIAC: RRR, no murmurs, rubs, gallops RESPIRATORY: Decreased breath sounds, exp ronchi noted ABDOMEN: Soft, non-tender, non-distended MUSCULOSKELETAL:  No edema; No deformity  SKIN: Warm and dry NEUROLOGIC:  Alert and oriented x 3 PSYCHIATRIC:  Normal affect   ASSESSMENT:    1. Thoracic aortic aneurysm without rupture ([place])   2. Pure hypercholesterolemia   3. Abdominal aortic aneurysm (AAA) without rupture (HCC)     PLAN:    In order of problems listed above:  Mild ascending aortic aneurysm, 4.1 cm.  No intervention indicated at this time.  Plan serial monitoring with echocardiogram.  Repeat echocardiogram prior to follow-up. Hyperlipidemia, continue Lipitor 40 mg daily for now.  Cholesterol improving but not at goal. He has AAA, infrarenal, 4.4 cm.  Appointment with vascular surgery.  Follow-up in 1 year.  Total encounter time 35 minutes  Greater than 50% was spent in counseling and coordination of care with the patient.   This note was generated in part or whole with voice recognition software. Voice recognition is usually quite accurate but there are transcription errors that can and very often do occur. I apologize for any typographical errors that were not detected and corrected.   Medication Adjustments/Labs and Tests Ordered: Current medicines are reviewed at length with the patient today.  Concerns regarding medicines are outlined above.  Orders Placed This Encounter  Procedures   EKG 12-Lead   ECHOCARDIOGRAM COMPLETE    No orders of the defined types were placed in this encounter.   Patient Instructions  Medication Instructions:  Your physician recommends that you continue on your current medications as directed. Please refer to the Current Medication list given to you today.  *If you need a refill on your cardiac medications before your next appointment, please call  your pharmacy*   Lab Work: None ordered If you have labs (blood work) drawn today and your tests are completely normal, you will receive your results only by: [place] (if you have MyChart) OR A paper copy in the mail If you have any lab test that is abnormal or we need to change your treatment, we will call you to review the results.   Testing/Procedures: None ordered   Follow-Up:  At [facility], you and your health needs are our priority.  As part of our continuing mission to provide you with exceptional heart care, we have created designated Provider Care Teams.  These Care Teams include your primary Cardiologist (physician) and Advanced Practice Providers (APPs -  Physician Assistants and Nurse Practitioners) who all work together to provide you with the care you need, when you need it.  We recommend signing up for the patient portal called "MyChart".  Sign up information is provided on this After Visit Summary.  MyChart is used to connect with patients for Virtual Visits (Telemedicine).  Patients are able to view lab/test results, encounter notes, upcoming appointments, etc.  Non-urgent messages can be sent to your provider as well.   To learn more about what you can do with MyChart, go to [email or web address].    Your next appointment:   1 year(s)  The format for your next appointment:   In Person  Provider:   Kate Sable, MD   Other Instructions    Signed, Kate Sable, MD  09/08/2020 12:33 PM    [place]

## 2020-09-08 NOTE — Patient Instructions (Signed)

## 2020-09-11 DIAGNOSIS — R491 Aphonia: Secondary | ICD-10-CM | POA: Diagnosis not present

## 2020-09-11 DIAGNOSIS — Z9002 Acquired absence of larynx: Secondary | ICD-10-CM | POA: Diagnosis not present

## 2020-09-24 DIAGNOSIS — Z87891 Personal history of nicotine dependence: Secondary | ICD-10-CM | POA: Diagnosis not present

## 2020-09-24 DIAGNOSIS — Z8521 Personal history of malignant neoplasm of larynx: Secondary | ICD-10-CM | POA: Diagnosis not present

## 2020-09-24 DIAGNOSIS — Z923 Personal history of irradiation: Secondary | ICD-10-CM | POA: Diagnosis not present

## 2020-09-24 DIAGNOSIS — Z08 Encounter for follow-up examination after completed treatment for malignant neoplasm: Secondary | ICD-10-CM | POA: Diagnosis not present

## 2020-09-24 DIAGNOSIS — Z8579 Personal history of other malignant neoplasms of lymphoid, hematopoietic and related tissues: Secondary | ICD-10-CM | POA: Diagnosis not present

## 2020-09-24 DIAGNOSIS — Z9002 Acquired absence of larynx: Secondary | ICD-10-CM | POA: Diagnosis not present

## 2020-09-26 DIAGNOSIS — J9504 Tracheo-esophageal fistula following tracheostomy: Secondary | ICD-10-CM | POA: Diagnosis not present

## 2020-09-26 DIAGNOSIS — C329 Malignant neoplasm of larynx, unspecified: Secondary | ICD-10-CM | POA: Diagnosis not present

## 2020-09-28 DIAGNOSIS — J449 Chronic obstructive pulmonary disease, unspecified: Secondary | ICD-10-CM | POA: Diagnosis not present

## 2020-10-23 ENCOUNTER — Ambulatory Visit
Admission: EM | Admit: 2020-10-23 | Discharge: 2020-10-23 | Disposition: A | Discharge status: Home / Self Care | Payer: Medicare HMO

## 2020-10-23 ENCOUNTER — Other Ambulatory Visit: Payer: Self-pay

## 2020-10-23 DIAGNOSIS — W540XXA Bitten by dog, initial encounter: Secondary | ICD-10-CM | POA: Diagnosis not present

## 2020-10-23 DIAGNOSIS — T148XXA Other injury of unspecified body region, initial encounter: Secondary | ICD-10-CM

## 2020-10-23 MED ORDER — AMOXICILLIN-POT CLAVULANATE 875-125 MG PO TABS: 1.0000 | ORAL_TABLET | Freq: Two times a day (BID) | ORAL | 0 refills | Status: DC

## 2020-10-23 NOTE — ED Triage Notes (Signed)
Pt c/o dog bite to his left hand earlier this evening. Pt was playing with his dog and she missed the toy and grabbed his hand by mistake. Pt has several scrapes and skin tears to the top of the left hand. Pt does not recall last Tetanus.

## 2020-10-23 NOTE — ED Provider Notes (Signed)
[facility]    CSN: [id number] Arrival date & time: 10/23/20  1839      History   Chief Complaint Chief Complaint  Patient presents with   Animal Bite    Left hand    HPI Luke [name] is a 70 y.o. male.   HPI  70 year old male here for evaluation of dog bite to left hand.  Patient reports that he was playing with his puppy tonight when the dog missed the toy and grabbed the patient's left hand.  He sustained several superficial skin tears to the back of his left hand.  All bleeding is controlled.  Patient denies any numbness or tingling in his fingers and has full range of motion of his hand.  No bony tenderness.  Patient reports that his dog's vaccinations are up-to-date and his tetanus shot is also up-to-date.  Past Medical History:  Diagnosis Date   Anxiety    Arthritis    Carcinoma metastatic to lymph node ([place])    Cataract    right   COPD (chronic obstructive pulmonary disease) (HCC)    Depression    GERD (gastroesophageal reflux disease)    Hoarseness of voice    Personal history of tobacco use, presenting hazards to health February of 2001   Thyroid disease     Patient Active Problem List   Diagnosis Date Noted   Hemiplegia, dominant side S/P CVA (cerebrovascular accident) ([place]) 04/01/2020   Presence of artificial larynx 03/31/2020   Laryngeal carcinoma ([place]) 01/07/2020   AAA (abdominal aortic aneurysm) without rupture 07/02/2019   Thoracic ascending aortic aneurysm 07/02/2019   Atherosclerosis of native arteries of extremity with intermittent claudication ([place]) 07/02/2019   Postviral fatigue syndrome 06/25/2019   Weakness of both lower extremities 06/25/2019   History of COVID-19 04/20/2019   Chronic bursitis of right shoulder 05/01/2018   Submucosal lesion of esophagus    Esophageal dysphagia    Anxiety 12/06/2017   Drug-induced myopathy 09/01/2017   Pre-diabetes 08/29/2017   Centrilobular emphysema ([place]) 07/08/2017   Dysphagia,  pharyngeal phase 07/08/2017   Personal history of tobacco use, presenting hazards to health 09/23/2014   Pure hypercholesterolemia 09/06/2014   Tobacco abuse 09/06/2014   Benign essential hypertension 06/15/2013   H/O lymphoma 06/15/2013   Gastroesophageal reflux disease 06/08/2013   Hypothyroidism 06/08/2013    Past Surgical History:  Procedure Laterality Date   BALLOON DILATION N/A 01/30/2018   Procedure: BALLOON DILATION;  Surgeon: Mauri Pole, MD;  Location: [facility] ENDOSCOPY;  Service: Endoscopy;  Laterality: N/A;   COLONOSCOPY     COLONOSCOPY WITH PROPOFOL N/A 11/29/2016   Procedure: COLONOSCOPY WITH PROPOFOL;  Surgeon: Manya Silvas, MD;  Location: [facility] ENDOSCOPY;  Service: Endoscopy;  Laterality: N/A;   COLONOSCOPY WITH PROPOFOL N/A 08/22/2017   Procedure: COLONOSCOPY WITH PROPOFOL;  Surgeon: Jonathon Bellows, MD;  Location: [facility] ENDOSCOPY;  Service: Gastroenterology;  Laterality: N/A;   ESOPHAGEAL MANOMETRY N/A 10/05/2017   Procedure: ESOPHAGEAL MANOMETRY (EM);  Surgeon: Jonathon Bellows, MD;  Location: [facility] ENDOSCOPY;  Service: Gastroenterology;  Laterality: N/A;   ESOPHAGEAL MANOMETRY N/A 01/30/2018   Procedure: ESOPHAGEAL MANOMETRY (EM);  Surgeon: Mauri Pole, MD;  Location: [facility] ENDOSCOPY;  Service: Endoscopy;  Laterality: N/A;   ESOPHAGOGASTRODUODENOSCOPY N/A 03/09/2018   Procedure: ESOPHAGOGASTRODUODENOSCOPY (EGD);  Surgeon: Milus Banister, MD;  Location: [facility] ENDOSCOPY;  Service: Endoscopy;  Laterality: N/A;   ESOPHAGOGASTRODUODENOSCOPY (EGD) WITH PROPOFOL N/A 11/29/2016   Procedure: ESOPHAGOGASTRODUODENOSCOPY (EGD) WITH PROPOFOL;  Surgeon: Manya Silvas, MD;  Location: [facility] ENDOSCOPY;  Service: Endoscopy;  Laterality: N/A;   ESOPHAGOGASTRODUODENOSCOPY (EGD) WITH PROPOFOL N/A 08/22/2017   Procedure: ESOPHAGOGASTRODUODENOSCOPY (EGD) WITH PROPOFOL;  Surgeon: Jonathon Bellows, MD;  Location: [facility] ENDOSCOPY;  Service: Gastroenterology;  Laterality: N/A;    ESOPHAGOGASTRODUODENOSCOPY (EGD) WITH PROPOFOL N/A 01/30/2018   Procedure: ESOPHAGOGASTRODUODENOSCOPY (EGD) WITH PROPOFOL;  Surgeon: Mauri Pole, MD;  Location: [facility] ENDOSCOPY;  Service: Endoscopy;  Laterality: N/A;  Mano probe to be placed during EGD   EUS N/A 03/09/2018   Procedure: UPPER ENDOSCOPIC ULTRASOUND (EUS) RADIAL;  Surgeon: Milus Banister, MD;  Location: [facility] ENDOSCOPY;  Service: Endoscopy;  Laterality: N/A;   NECK SURGERY     status post diagnosis of cancer with chemo and radiation   SUBMUCOSAL INJECTION  03/09/2018   Procedure: SUBMUCOSAL INJECTION;  Surgeon: Milus Banister, MD;  Location: [facility] ENDOSCOPY;  Service: Endoscopy;;   TONSILLECTOMY     TOTAL LARYNGECTOMY  01/31/2020       Home Medications    Prior to Admission medications   Medication Sig Start Date End Date Taking? Authorizing Provider  amoxicillin-clavulanate (AUGMENTIN) 875-125 MG tablet Take 1 tablet by mouth every 12 (twelve) hours. 10/23/20  Yes Margarette Canada, NP  aspirin EC 81 MG tablet Take 1 tablet (81 mg total) by mouth daily. Swallow whole. 09/07/19  Yes Johnson, Megan P, DO  atorvastatin (LIPITOR) 80 MG tablet Take 80 mg by mouth daily. 09/07/20  Yes [provider]  Cyanocobalamin (B-12 PO) Take by mouth daily in the afternoon.   Yes [provider]  levothyroxine (SYNTHROID) 100 MCG tablet Take 1 tablet (100 mcg total) by mouth daily before breakfast. 05/20/20  Yes Johnson, Megan P, DO  meloxicam (MOBIC) 15 MG tablet Take 1 tablet (15 mg total) by mouth daily. 07/01/20  Yes Johnson, Megan P, DO  Multiple Vitamin (MULTIVITAMIN WITH MINERALS) TABS tablet Take 1 tablet by mouth daily. One-A-Day Multivitamin   Yes [provider]  omeprazole (PRILOSEC) 20 MG capsule TAKE 1 CAPSULE DAILY BEFORE BREAKFAST 07/01/20  Yes Johnson, Megan P, DO  sucralfate (CARAFATE) 1 g tablet Take 1 g by mouth once.   Yes [provider]  Vitamin D, Ergocalciferol, (DRISDOL) 1.25 MG (50000  UNIT) CAPS capsule Take 1 capsule (50,000 Units total) by mouth every 7 (seven) days. 10/13/19  Yes Raulkar, Clide Deutscher, MD  vitamin E 180 MG (400 UNITS) capsule Take 400 Units by mouth daily.   Yes [provider]  zinc sulfate 220 (50 Zn) MG capsule Take 220 mg by mouth daily.   Yes [provider]    Family History Family History  Problem Relation Age of Onset   Breast cancer Mother    Heart disease Mother    Cancer Mother        breast, back, bladder   Heart attack Father    Heart disease Father     Social History Social History   Tobacco Use   Smoking status: Former    Years: 51.00    Types: Cigarettes    Start date: 1970   Smokeless tobacco: Former   Tobacco comments:    05/03/19- down to half a pack or less / day  Vaping Use   Vaping Use: Never used  Substance Use Topics   Alcohol use: No   Drug use: No     Allergies   Patient has no known allergies.   Review of Systems Review of Systems  Constitutional:  Negative for activity change, appetite change and  fever.  Musculoskeletal:  Negative for arthralgias and myalgias.  Skin:  Positive for color change and wound.  Neurological:  Negative for weakness and numbness.  Hematological: Negative.   Psychiatric/Behavioral: Negative.      Physical Exam Triage Vital Signs ED Triage Vitals  Enc Vitals Group     BP 10/23/20 1910 107/86     Pulse Rate 10/23/20 1910 64     Resp 10/23/20 1910 18     Temp 10/23/20 1910 98.4 F (36.9 C)     Temp Source 10/23/20 1910 Oral     SpO2 10/23/20 1910 97 %     Weight 10/23/20 1908 205 lb (93 kg)     Height 10/23/20 1908 6\' 1"  (1.854 m)     Head Circumference --      Peak Flow --      Pain Score 10/23/20 1907 0     Pain Loc --      Pain Edu? --      Excl. in [place]? --    No data found.  Updated Vital Signs BP 107/86 (BP Location: Left Arm)   Pulse 64   Temp 98.4 F (36.9 C) (Oral)   Resp 18   Ht 6\' 1"  (1.854 m)   Wt 205 lb (93 kg)   SpO2 97%    BMI 27.05 kg/m   Visual Acuity Right Eye Distance:   Left Eye Distance:   Bilateral Distance:    Right Eye Near:   Left Eye Near:    Bilateral Near:     Physical Exam Vitals and nursing note reviewed.  Constitutional:      Appearance: Normal appearance.  HENT:     Head: Normocephalic and atraumatic.  Musculoskeletal:        General: Signs of injury present. No swelling or tenderness.  Skin:    General: Skin is warm and dry.     Capillary Refill: Capillary refill takes less than 2 seconds.     Findings: Bruising and erythema present.  Neurological:     General: No focal deficit present.     Mental Status: He is alert and oriented to person, place, and time.  Psychiatric:        Mood and Affect: Mood normal.        Behavior: Behavior normal.        Thought Content: Thought content normal.        Judgment: Judgment normal.     UC Treatments / Results  Labs (all labs ordered are listed, but only abnormal results are displayed) Labs Reviewed - No data to display  EKG   Radiology No results found.  Procedures Procedures (including critical care time)  Medications Ordered in UC Medications - No data to display  Initial Impression / Assessment and Plan / UC Course  I have reviewed the triage vital signs and the nursing notes.  Pertinent labs & imaging results that were available during my care of the patient were reviewed by me and considered in my medical decision making (see chart for details).  Patient is a very pleasant 70 year old male here for evaluation of superficial skin tears to the dorsum of his left hand that were sustained while he was playing with his puppy this evening.  The dog's vaccinations are up-to-date and so the patient's tetanus shot.  Patient has a series of superficial abrasions to the distal, middle, and proximal aspect of the left hand.  No active bleeding from any of the wounds.  The  patient has no bony tenderness in the carpal bones or  metacarpals of the left hand.  Grip is 5/5 in the left hand.  Patient has full range of motion of his wrist and hand.  Will place patient on Augmentin twice daily for 7 days for empiric antibiotic therapy and apply a clean, nonadherent dressing.   Final Clinical Impressions(s) / UC Diagnoses   Final diagnoses:  Abrasion  Dog bite, initial encounter     Discharge Instructions      Take the Augmentin twice daily with food for 7 days for prevention of infection from the dog bite on your left hand.  Keep your abrasions clean and dry and apply a new dressing and bacitracin twice daily for the next 2 days.  After 2 days you can stop applying bacitracin to the wounds as a scab should have started to form.  At this point you can leave them open to air when you are at home and cover them with a dry dressing when you public.  Return for reevaluation for increased redness, swelling, dryness, red streaks going up your arm, or fever.     ED Prescriptions     Medication Sig Dispense Auth. Provider   amoxicillin-clavulanate (AUGMENTIN) 875-125 MG tablet Take 1 tablet by mouth every 12 (twelve) hours. 14 tablet Margarette Canada, NP      PDMP not reviewed this encounter.   Margarette Canada, NP 10/23/20 Luke [name]

## 2020-10-23 NOTE — Discharge Instructions (Signed)
Take the Augmentin twice daily with food for 7 days for prevention of infection from the dog bite on your left hand.  Keep your abrasions clean and dry and apply a new dressing and bacitracin twice daily for the next 2 days.  After 2 days you can stop applying bacitracin to the wounds as a scab should have started to form.  At this point you can leave them open to air when you are at home and cover them with a dry dressing when you public.  Return for reevaluation for increased redness, swelling, dryness, red streaks going up your arm, or fever.

## 2020-10-26 DIAGNOSIS — C329 Malignant neoplasm of larynx, unspecified: Secondary | ICD-10-CM | POA: Diagnosis not present

## 2020-10-26 DIAGNOSIS — J9504 Tracheo-esophageal fistula following tracheostomy: Secondary | ICD-10-CM | POA: Diagnosis not present

## 2020-11-05 DIAGNOSIS — R491 Aphonia: Secondary | ICD-10-CM | POA: Diagnosis not present

## 2020-11-20 ENCOUNTER — Other Ambulatory Visit: Payer: Self-pay | Admitting: Family Medicine

## 2020-11-20 DIAGNOSIS — K219 Gastro-esophageal reflux disease without esophagitis: Secondary | ICD-10-CM

## 2020-11-20 DIAGNOSIS — Z8579 Personal history of other malignant neoplasms of lymphoid, hematopoietic and related tissues: Secondary | ICD-10-CM

## 2020-11-20 NOTE — Telephone Encounter (Signed)
Requested Prescriptions  Pending Prescriptions Disp Refills  . meloxicam (MOBIC) 15 MG tablet [Pharmacy Med Name: MELOXICAM 15 MG Tablet] 90 tablet 0    Sig: TAKE 1 TABLET EVERY DAY     Analgesics:  COX2 Inhibitors Failed - 11/20/2020  1:12 PM      Failed - HGB in normal range and within 360 days    Hemoglobin  Date Value Ref Range Status  05/19/2020 12.8 (L) 13.0 - 17.7 g/dL Final         Passed - Cr in normal range and within 360 days    Creat  Date Value Ref Range Status  02/09/2019 1.05 0.70 - 1.25 mg/dL Final    Comment:    For patients >56 years of age, the reference limit for Creatinine is approximately 13% higher for people identified as African-American. .    Creatinine, Ser  Date Value Ref Range Status  03/31/2020 0.97 0.76 - 1.27 mg/dL Final         Passed - Patient is not pregnant      Passed - Valid encounter within last 12 months    Recent Outpatient Visits          4 months ago Gastroesophageal reflux disease, unspecified whether esophagitis present   [place], Megan P, DO   7 months ago Laryngeal carcinoma [facility])   [place], Megan P, DO   10 months ago Laryngeal carcinoma [facility])   [place], Megan P, DO   11 months ago Sore throat   [place], DO   1 year ago Dysfunction of both eustachian tubes   [facility] Valerie Roys, DO      Future Appointments            In 3 weeks  [place], [place]   In 1 month [place], Barb Merino, DO [employer]           . omeprazole (PRILOSEC) 20 MG capsule [Pharmacy Med Name: OMEPRAZOLE 20 MG Capsule Delayed Release] 90 capsule 0    Sig: TAKE 1 CAPSULE EVERY DAY BEFORE BREAKFAST     Gastroenterology: Proton Pump Inhibitors Passed - 11/20/2020  1:12 PM      Passed - Valid encounter within last 12 months    Recent Outpatient Visits          4 months ago  Gastroesophageal reflux disease, unspecified whether esophagitis present   [place], Megan P, DO   7 months ago Laryngeal carcinoma [facility])   [place], Megan P, DO   10 months ago Laryngeal carcinoma [facility])   [place], Megan P, DO   11 months ago Sore throat   [place], DO   1 year ago Dysfunction of both eustachian tubes   [facility] Valerie Roys, DO      Future Appointments            In 3 weeks  [place], [place]   In 1 month [place], Megan P, DO [employer]

## 2020-12-15 ENCOUNTER — Ambulatory Visit (INDEPENDENT_AMBULATORY_CARE_PROVIDER_SITE_OTHER): Payer: Medicare HMO | Admitting: *Deleted

## 2020-12-15 DIAGNOSIS — Z0000 Encounter for general adult medical examination without abnormal findings: Secondary | ICD-10-CM | POA: Diagnosis not present

## 2020-12-15 NOTE — Progress Notes (Signed)
Subjective:   RHYS LICHTY is a 70 y.o. male who presents for Medicare Annual/Subsequent preventive examination.  I connected with  [name] on 12/15/20 by a telephone enabled telemedicine application and verified that I am speaking with the correct person using two identifiers.   I discussed the limitations of evaluation and management by telemedicine. The patient expressed understanding and agreed to proceed.  Patient location: home  Provider location:   Tele-Health  not in office    Review of Systems     Cardiac Risk Factors include: advanced age (>44men, >30 women);male gender     Objective:    Today's Vitals   There is no height or weight on file to calculate BMI.  Advanced Directives 12/15/2020 12/14/2019 03/21/2019 03/09/2018 01/30/2018 10/17/2017 08/22/2017  Does Patient Have a Medical Advance Directive? Yes No No No No No No  Type of Advance Directive [place] in Chart? No - copy requested - - - - - -  Would patient like information on creating a medical advance directive? - - No - Patient declined No - Patient declined No - Patient declined - No - Patient declined    Current Medications (verified) Outpatient Encounter Medications as of 12/15/2020  Medication Sig   aspirin EC 81 MG tablet Take 1 tablet (81 mg total) by mouth daily. Swallow whole.   atorvastatin (LIPITOR) 80 MG tablet Take 80 mg by mouth daily.   Cyanocobalamin (B-12 PO) Take by mouth daily in the afternoon.   levothyroxine (SYNTHROID) 100 MCG tablet Take 1 tablet (100 mcg total) by mouth daily before breakfast.   meloxicam (MOBIC) 15 MG tablet TAKE 1 TABLET EVERY DAY   Multiple Vitamin (MULTIVITAMIN WITH MINERALS) TABS tablet Take 1 tablet by mouth daily. One-A-Day Multivitamin   omeprazole (PRILOSEC) 20 MG capsule TAKE 1 CAPSULE EVERY DAY BEFORE BREAKFAST   sucralfate (CARAFATE) 1 g tablet Take 1 g by mouth once.   Vitamin D,  Ergocalciferol, (DRISDOL) 1.25 MG (50000 UNIT) CAPS capsule Take 1 capsule (50,000 Units total) by mouth every 7 (seven) days.   vitamin E 180 MG (400 UNITS) capsule Take 400 Units by mouth daily.   zinc sulfate 220 (50 Zn) MG capsule Take 220 mg by mouth daily.   amoxicillin-clavulanate (AUGMENTIN) 875-125 MG tablet Take 1 tablet by mouth every 12 (twelve) hours.   No facility-administered encounter medications on file as of 12/15/2020.    Allergies (verified) Patient has no known allergies.   History: Past Medical History:  Diagnosis Date   Anxiety    Arthritis    Carcinoma metastatic to lymph node (HCC)    Cataract    right   COPD (chronic obstructive pulmonary disease) (HCC)    Depression    GERD (gastroesophageal reflux disease)    Hoarseness of voice    Personal history of tobacco use, presenting hazards to health February of 2001   Thyroid disease    Past Surgical History:  Procedure Laterality Date   BALLOON DILATION N/A 01/30/2018   Procedure: BALLOON DILATION;  Surgeon: Mauri Pole, MD;  Location: [facility] ENDOSCOPY;  Service: Endoscopy;  Laterality: N/A;   COLONOSCOPY     COLONOSCOPY WITH PROPOFOL N/A 11/29/2016   Procedure: COLONOSCOPY WITH PROPOFOL;  Surgeon: Manya Silvas, MD;  Location[facility] ENDOSCOPY;  Service: Endoscopy;  Laterality: N/A;   COLONOSCOPY WITH PROPOFOL N/A 08/22/2017   Procedure: COLONOSCOPY WITH PROPOFOL;  Surgeon: Jonathon Bellows, MD;  Location: [facility] ENDOSCOPY;  Service: Gastroenterology;  Laterality: N/A;   ESOPHAGEAL MANOMETRY N/A 10/05/2017   Procedure: ESOPHAGEAL MANOMETRY (EM);  Surgeon: Jonathon Bellows, MD;  Location: [facility] ENDOSCOPY;  Service: Gastroenterology;  Laterality: N/A;   ESOPHAGEAL MANOMETRY N/A 01/30/2018   Procedure: ESOPHAGEAL MANOMETRY (EM);  Surgeon: Mauri Pole, MD;  Location: [facility] ENDOSCOPY;  Service: Endoscopy;  Laterality: N/A;   ESOPHAGOGASTRODUODENOSCOPY N/A 03/09/2018   Procedure: ESOPHAGOGASTRODUODENOSCOPY (EGD);   Surgeon: Milus Banister, MD;  Location: [facility] END[facility];  Service: Endoscopy;  Laterality: N/A;   ESOPHAGOGASTRODUODENOSCOPY (EGD) WITH PROPOFOL N/A 11/29/2016   Procedure: ESOPHAGOGASTRODUODENOSCOPY (EGD) WITH PROPOFOL;  Surgeon: Manya Silvas, MD;  Location: [facility] ENDOSCOPY;  Service: Endoscopy;  Laterality: N/A;   ESOPHAGOGASTRODUODENOSCOPY (EGD) WITH PROPOFOL N/A 08/22/2017   Procedure: ESOPHAGOGASTRODUODENOSCOPY (EGD) WITH PROPOFOL;  Surgeon: Jonathon Bellows, MD;  Location[facility] ENDOSCOPY;  Service: Gastroenterology;  Laterality: N/A;   ESOPHAGOGASTRODUODENOSCOPY (EGD) WITH PROPOFOL N/A 01/30/2018   Procedure: ESOPHAGOGASTRODUODENOSCOPY (EGD) WITH PROPOFOL;  Surgeon: Mauri Pole, MD;  Location: [facility] ENDOSCOPY;  Service: Endoscopy;  Laterality: N/A;  Mano probe to be placed during EGD   EUS N/A 03/09/2018   Procedure: UPPER ENDOSCOPIC ULTRASOUND (EUS) RADIAL;  Surgeon: Milus Banister, MD;  Location: [facility] ENDOSCOPY;  Service: Endoscopy;  Laterality: N/A;   NECK SURGERY     status post diagnosis of cancer with chemo and radiation   SUBMUCOSAL INJECTION  03/09/2018   Procedure: SUBMUCOSAL INJECTION;  Surgeon: Milus Banister, MD;  Location: [facility] ENDOSCOPY;  Service: Endoscopy;;   TONSILLECTOMY     TOTAL LARYNGECTOMY  01/31/2020   Family History  Problem Relation Age of Onset   Breast cancer Mother    Heart disease Mother    Cancer Mother        breast, back, bladder   Heart attack Father    Heart disease Father    Social History   Socioeconomic History   Marital status: Single    Spouse name: Not on file   Number of children: 1   Years of education: High School   Highest education level: High school graduate  Occupational History   Not on file  Tobacco Use   Smoking status: Former    Years: 51.00    Types: Cigarettes    Start date: 1970   Smokeless tobacco: Former   Tobacco comments:    05/03/19- down to half a pack or less / day  Vaping Use   Vaping Use: Never used   Substance and Sexual Activity   Alcohol use: No   Drug use: No   Sexual activity: Not Currently  Other Topics Concern   Not on file  Social History Narrative   Not on file   Social Determinants of Health   Financial Resource Strain: Low Risk    Difficulty of Paying Living Expenses: Not hard at all  Food Insecurity: No Food Insecurity   Worried About Charity fundraiser in the Last Year: Never true   [place] in the Last Year: Never true  Transportation Needs: No Transportation Needs   Lack of Transportation (Medical): No   Lack of Transportation (Non-Medical): No  Physical Activity: Insufficiently Active   Days of Exercise per Week: 4 days   Minutes of Exercise per Session: 30 min  Stress: No Stress Concern Present   Feeling of Stress : Not at all  Social Connections: Unknown   Frequency of Communication with Friends and Family: More than  three times a week   Frequency of Social Gatherings with Friends and Family: More than three times a week   Attends Religious Services: Never   Marine scientist or Organizations: No   Attends Archivist Meetings: Never   Marital Status: Not on file    Tobacco Counseling Counseling given: Not Answered Tobacco comments: 05/03/19- down to half a pack or less / day   Clinical Intake:  Pre-visit preparation completed: Yes  Pain : No/denies pain     Nutritional Risks: None Diabetes: No  How often do you need to have someone help you when you read instructions, pamphlets, or other written materials from your doctor or pharmacy?: 1 - Never  Diabetic?  no  Interpreter Needed?: No  Information entered by :: Leroy Kennedy LPN   Activities of Daily Living In your present state of health, do you have any difficulty performing the following activities: 12/15/2020  Hearing? Y  Vision? N  Difficulty concentrating or making decisions? N  Walking or climbing stairs? N  Dressing or bathing? N  Doing errands,  shopping? N  Preparing Food and eating ? N  Using the Toilet? N  In the past six months, have you accidently leaked urine? N  Do you have problems with loss of bowel control? N  Managing your Medications? N  Managing your Finances? N  Housekeeping or managing your Housekeeping? N  Some recent data might be hidden    Patient Care Team: Valerie Roys, DO as PCP - General (Family Medicine) Kate Sable, MD as PCP - Cardiology (Cardiology) Vladimir Faster, [facility] (Pharmacist)  Indicate any recent Medical Services you may have received from other than [facility] providers in the past year (date may be approximate).     Assessment:   This is a routine wellness examination for [place].  Hearing/Vision screen Hearing Screening - Comments:: No hearing aids  Vision Screening - Comments:: Dr Ellin Mayhew Up to date  Dietary issues and exercise activities discussed: Current Exercise Habits: Home exercise routine, Type of exercise: walking, Time (Minutes): 35, Frequency (Times/Week): 5, Weekly Exercise (Minutes/Week): 175, Intensity: Mild   Goals Addressed             This Visit's Progress    Patient Stated       No goals       Depression Screen PHQ 2/9 Scores 12/15/2020 12/14/2019 10/12/2019 09/07/2019 05/10/2019 04/09/2019 03/28/2019  PHQ - 2 Score 0 0 1 0 4 5 0  PHQ- 9 Score - - 11 3 16 19  -  Exception Documentation - - - - - - -    Fall Risk Fall Risk  12/15/2020 12/25/2019 12/14/2019 11/13/2019 10/12/2019  Falls in the past year? 0 0 0 0 0  Number falls in past yr: 0 - - - -  Injury with Fall? 0 - - - -  Risk for fall due to : - - Medication side effect - -  Follow up Falls evaluation completed;Falls prevention discussed - Falls evaluation completed;Education provided;Falls prevention discussed - -    FALL RISK PREVENTION PERTAINING TO THE HOME:  Any stairs in or around the home? No  If so, are there any without handrails? No  Home free of loose throw rugs in walkways, pet beds,  electrical cords, etc? Yes  Adequate lighting in your home to reduce risk of falls? Yes   ASSISTIVE DEVICES UTILIZED TO PREVENT FALLS:  Life alert? No  Use of a cane, walker or w/c? No  Grab bars in the bathroom? No  Shower chair or bench in shower? No  Elevated toilet seat or a handicapped toilet? No   TIMED UP AND GO:  Was the test performed? No .    Cognitive Function:     6CIT Screen 12/15/2020 12/14/2019  What Year? 0 points 0 points  What month? 0 points 0 points  What time? 0 points 0 points  Count back from 20 0 points 2 points  Months in reverse 0 points 4 points  Repeat phrase 0 points 4 points  Total Score 0 10    Immunizations Immunization History  Administered Date(s) Administered   Fluad Quad(high Dose 65+) 10/05/2019   Influenza Inj Mdck Quad With Preservative 10/21/2016   Influenza,inj,Quad PF,6+ Mos 10/09/2015   Influenza-Unspecified 11/04/2017, 10/17/2019   Moderna Sars-Covid-2 Vaccination 05/30/2019   Pneumococcal Conjugate-13 02/16/2019   Pneumococcal Polysaccharide-23 07/02/2017   Zoster, Live 09/05/2013    TDAP status: Due, Education has been provided regarding the importance of this vaccine. Advised may receive this vaccine at local pharmacy or Health Dept. Aware to provide a copy of the vaccination record if obtained from local pharmacy or Health Dept. Verbalized acceptance and understanding.  Flu Vaccine status: Due, Education has been provided regarding the importance of this vaccine. Advised may receive this vaccine at local pharmacy or Health Dept. Aware to provide a copy of the vaccination record if obtained from local pharmacy or Health Dept. Verbalized acceptance and understanding.  Pneumococcal vaccine status: Up to date  Covid-19 vaccine status: Information provided on how to obtain vaccines.   Qualifies for Shingles Vaccine? Yes   Zostavax completed Yes   Shingrix Completed?: No.    Education has been provided regarding the  importance of this vaccine. Patient has been advised to call insurance company to determine out of pocket expense if they have not yet received this vaccine. Advised may also receive vaccine at local pharmacy or Health Dept. Verbalized acceptance and understanding.  Screening Tests Health Maintenance  Topic Date Due   Zoster Vaccines- Shingrix (1 of 2) Never done   COVID-19 Vaccine (2 - Moderna risk series) 06/27/2019   INFLUENZA VACCINE  08/11/2020   Pneumonia Vaccine 64+ Years old (3 - PPSV23 if available, else PCV20) 07/03/2022   TETANUS/TDAP  07/12/2022   COLONOSCOPY (Pts 45-58yrs Insurance coverage will need to be confirmed)  08/23/2022   Hepatitis C Screening  Completed   HPV VACCINES  Aged Out    Health Maintenance  Health Maintenance Due  Topic Date Due   Zoster Vaccines- Shingrix (1 of 2) Never done   COVID-19 Vaccine (2 - Moderna risk series) 06/27/2019   INFLUENZA VACCINE  08/11/2020    Colorectal cancer screening: Type of screening: Colonoscopy. Completed 2019. Repeat every 5 years  Lung Cancer Screening: (Low Dose CT Chest recommended if Age 11-80 years, 30 pack-year currently smoking OR have quit w/in 15years.) does qualify.   Lung Cancer Screening Referral:   will speak to [facility]- at appointment  Additional Screening:  Hepatitis C Screening: does not qualify; Completed 2019  Vision Screening: Recommended annual ophthalmology exams for early detection of glaucoma and other disorders of the eye. Is the patient up to date with their annual eye exam?  Yes  Who is the provider or what is the name of the office in which the patient attends annual eye exams? Dr. Ellin Mayhew If pt is not established with a provider, would they like to be referred to a provider to establish  care? No .   Dental Screening: Recommended annual dental exams for proper oral hygiene  Community Resource Referral / Chronic Care Management: CRR required this visit?  No   CCM required this  visit?  No      Plan:     I have personally reviewed and noted the following in the patient's chart:   Medical and social history Use of alcohol, tobacco or illicit drugs  Current medications and supplements including opioid prescriptions. Patient is not currently taking opioid prescriptions. Functional ability and status Nutritional status Physical activity Advanced directives List of other physicians Hospitalizations, surgeries, and ER visits in previous 12 months Vitals Screenings to include cognitive, depression, and falls Referrals and appointments  In addition, I have reviewed and discussed with patient certain preventive protocols, quality metrics, and best practice recommendations. A written personalized care plan for preventive services as well as general preventive health recommendations were provided to patient.     Leroy Kennedy, LPN   11/19/1476   Nurse Notes: Patient sister [name] assisted in appointment.

## 2020-12-15 NOTE — Patient Instructions (Signed)
[name] , Thank you for taking time to come for your Medicare Wellness Visit I appreciate your ongoing commitment to your health goals. Please review the following plan we discussed and let me know if I can assist you in the future.   Screening recommendations/referrals: Colonoscopy: up to date Recommended yearly ophthalmology/optometry visit for glaucoma screening and checkup Recommended yearly dental visit for hygiene and checkup  Vaccinations: Influenza vaccine: Education provided Pneumococcal vaccine: up to date Tdap vaccine: Education provided Shingles vaccine: Education provided    Advanced directives: copy requested   Conditions/risks identified:   Next appointment: 12-16-2020   @ 3:40  Luke [name]          12-31-2020 @ 9:40 Luke [name]   Preventive Care 68 Years and Older, Male Preventive care refers to lifestyle choices and visits with your health care provider that can promote health and wellness. What does preventive care include? A yearly physical exam. This is also called an annual well check. Dental exams once or twice a year. Routine eye exams. Ask your health care provider how often you should have your eyes checked. Personal lifestyle choices, including: Daily care of your teeth and gums. Regular physical activity. Eating a healthy diet. Avoiding tobacco and drug use. Limiting alcohol use. Practicing safe sex. Taking low doses of aspirin every day. Taking vitamin and mineral supplements as recommended by your health care provider. What happens during an annual well check? The services and screenings done by your health care provider during your annual well check will depend on your age, overall health, lifestyle risk factors, and family history of disease. Counseling  Your health care provider may ask you questions about your: Alcohol use. Tobacco use. Drug use. Emotional well-being. Home and relationship well-being. Sexual activity. Eating habits. History  of falls. Memory and ability to understand (cognition). Work and work Statistician. Screening  You may have the following tests or measurements: Height, weight, and BMI. Blood pressure. Lipid and cholesterol levels. These may be checked every 5 years, or more frequently if you are over 83 years old. Skin check. Lung cancer screening. You may have this screening every year starting at age 68 if you have a 30-pack-year history of smoking and currently smoke or have quit within the past 15 years. Fecal occult blood test (FOBT) of the stool. You may have this test every year starting at age 68. Flexible sigmoidoscopy or colonoscopy. You may have a sigmoidoscopy every 5 years or a colonoscopy every 10 years starting at age 68. Prostate cancer screening. Recommendations will vary depending on your family history and other risks. Hepatitis C blood test. Hepatitis B blood test. Sexually transmitted disease (STD) testing. Diabetes screening. This is done by checking your blood sugar (glucose) after you have not eaten for a while (fasting). You may have this done every 1-3 years. Abdominal aortic aneurysm (AAA) screening. You may need this if you are a current or former smoker. Osteoporosis. You may be screened starting at age 68 if you are at high risk. Talk with your health care provider about your test results, treatment options, and if necessary, the need for more tests. Vaccines  Your health care provider may recommend certain vaccines, such as: Influenza vaccine. This is recommended every year. Tetanus, diphtheria, and acellular pertussis (Tdap, Td) vaccine. You may need a Td booster every 10 years. Zoster vaccine. You may need this after age 68. Pneumococcal 13-valent conjugate (PCV13) vaccine. One dose is recommended after age 68. Pneumococcal polysaccharide (PPSV23) vaccine. One dose  is recommended after age 68. Talk to your health care provider about which screenings and vaccines you need  and how often you need them. This information is not intended to replace advice given to you by your health care provider. Make sure you discuss any questions you have with your health care provider. Document Released: 01/24/2015 Document Revised: 09/17/2015 Document Reviewed: 10/29/2014 Elsevier Interactive Patient Education  2017 [employer] Prevention in the Home Falls can cause injuries. They can happen to people of all ages. There are many things you can do to make your home safe and to help prevent falls. What can I do on the outside of my home? Regularly fix the edges of walkways and driveways and fix any cracks. Remove anything that might make you trip as you walk through a door, such as a raised step or threshold. Trim any bushes or trees on the path to your home. Use bright outdoor lighting. Clear any walking paths of anything that might make someone trip, such as rocks or tools. Regularly check to see if handrails are loose or broken. Make sure that both sides of any steps have handrails. Any raised decks and porches should have guardrails on the edges. Have any leaves, snow, or ice cleared regularly. Use sand or salt on walking paths during winter. Clean up any spills in your garage right away. This includes oil or grease spills. What can I do in the bathroom? Use night lights. Install grab bars by the toilet and in the tub and shower. Do not use towel bars as grab bars. Use non-skid mats or decals in the tub or shower. If you need to sit down in the shower, use a plastic, non-slip stool. Keep the floor dry. Clean up any water that spills on the floor as soon as it happens. Remove soap buildup in the tub or shower regularly. Attach bath mats securely with double-sided non-slip rug tape. Do not have throw rugs and other things on the floor that can make you trip. What can I do in the bedroom? Use night lights. Make sure that you have a light by your bed that is easy  to reach. Do not use any sheets or blankets that are too big for your bed. They should not hang down onto the floor. Have a firm chair that has side arms. You can use this for support while you get dressed. Do not have throw rugs and other things on the floor that can make you trip. What can I do in the kitchen? Clean up any spills right away. Avoid walking on wet floors. Keep items that you use a lot in easy-to-reach places. If you need to reach something above you, use a strong step stool that has a grab bar. Keep electrical cords out of the way. Do not use floor polish or wax that makes floors slippery. If you must use wax, use non-skid floor wax. Do not have throw rugs and other things on the floor that can make you trip. What can I do with my stairs? Do not leave any items on the stairs. Make sure that there are handrails on both sides of the stairs and use them. Fix handrails that are broken or loose. Make sure that handrails are as long as the stairways. Check any carpeting to make sure that it is firmly attached to the stairs. Fix any carpet that is loose or worn. Avoid having throw rugs at the top or bottom of the stairs.  If you do have throw rugs, attach them to the floor with carpet tape. Make sure that you have a light switch at the top of the stairs and the bottom of the stairs. If you do not have them, ask someone to add them for you. What else can I do to help prevent falls? Wear shoes that: Do not have high heels. Have rubber bottoms. Are comfortable and fit you well. Are closed at the toe. Do not wear sandals. If you use a stepladder: Make sure that it is fully opened. Do not climb a closed stepladder. Make sure that both sides of the stepladder are locked into place. Ask someone to hold it for you, if possible. Clearly mark and make sure that you can see: Any grab bars or handrails. First and last steps. Where the edge of each step is. Use tools that help you move  around (mobility aids) if they are needed. These include: Canes. Walkers. Scooters. Crutches. Turn on the lights when you go into a dark area. Replace any light bulbs as soon as they burn out. Set up your furniture so you have a clear path. Avoid moving your furniture around. If any of your floors are uneven, fix them. If there are any pets around you, be aware of where they are. Review your medicines with your doctor. Some medicines can make you feel dizzy. This can increase your chance of falling. Ask your doctor what other things that you can do to help prevent falls. This information is not intended to replace advice given to you by your health care provider. Make sure you discuss any questions you have with your health care provider. Document Released: 10/24/2008 Document Revised: 06/05/2015 Document Reviewed: 02/01/2014 Elsevier Interactive Patient Education  2017 [employer].

## 2020-12-16 ENCOUNTER — Encounter: Payer: Self-pay | Admitting: Family Medicine

## 2020-12-16 ENCOUNTER — Other Ambulatory Visit: Payer: Self-pay

## 2020-12-16 ENCOUNTER — Ambulatory Visit (INDEPENDENT_AMBULATORY_CARE_PROVIDER_SITE_OTHER): Payer: Medicare HMO | Admitting: Family Medicine

## 2020-12-16 VITALS — BP 89/59 | HR 65 | Temp 98.6°F | Wt 205.0 lb

## 2020-12-16 DIAGNOSIS — E559 Vitamin D deficiency, unspecified: Secondary | ICD-10-CM | POA: Diagnosis not present

## 2020-12-16 DIAGNOSIS — Z113 Encounter for screening for infections with a predominantly sexual mode of transmission: Secondary | ICD-10-CM

## 2020-12-16 DIAGNOSIS — E7800 Pure hypercholesterolemia, unspecified: Secondary | ICD-10-CM

## 2020-12-16 DIAGNOSIS — R7301 Impaired fasting glucose: Secondary | ICD-10-CM | POA: Insufficient documentation

## 2020-12-16 DIAGNOSIS — R5382 Chronic fatigue, unspecified: Secondary | ICD-10-CM | POA: Diagnosis not present

## 2020-12-16 DIAGNOSIS — E039 Hypothyroidism, unspecified: Secondary | ICD-10-CM

## 2020-12-16 DIAGNOSIS — J432 Centrilobular emphysema: Secondary | ICD-10-CM

## 2020-12-16 DIAGNOSIS — I959 Hypotension, unspecified: Secondary | ICD-10-CM | POA: Diagnosis not present

## 2020-12-16 DIAGNOSIS — R29898 Other symptoms and signs involving the musculoskeletal system: Secondary | ICD-10-CM

## 2020-12-16 DIAGNOSIS — C329 Malignant neoplasm of larynx, unspecified: Secondary | ICD-10-CM

## 2020-12-16 DIAGNOSIS — I7121 Aneurysm of the ascending aorta, without rupture: Secondary | ICD-10-CM

## 2020-12-16 DIAGNOSIS — E538 Deficiency of other specified B group vitamins: Secondary | ICD-10-CM | POA: Diagnosis not present

## 2020-12-16 DIAGNOSIS — Z23 Encounter for immunization: Secondary | ICD-10-CM

## 2020-12-16 DIAGNOSIS — I714 Abdominal aortic aneurysm, without rupture, unspecified: Secondary | ICD-10-CM

## 2020-12-16 NOTE — Assessment & Plan Note (Signed)
Continue to monitor. Call with any concerns.  

## 2020-12-16 NOTE — Assessment & Plan Note (Signed)
Stable. Continue to follow with oncology. Call with any concerns.  ?

## 2020-12-16 NOTE — Progress Notes (Signed)
BP (!) 89/59   Pulse 65   Temp 98.6 F (37 C)   Wt 205 lb (93 kg)   SpO2 92%   BMI 27.05 kg/m    Subjective:    Patient ID: Luke [name], male    DOB: 05-23-1950, 70 y.o.   MRN: [id number]  HPI: Luke [name] is a 70 y.o. male  Chief Complaint  Patient presents with   Fatigue    Patient states he has been feeling extremely tired lately. Patient states he wakes up every morning and feels tired to where he feels he just wants to lay around the house all day. Patient states he has been dealing with this issue for the past 3 months. Patient wife would like to discuss with provider if patient's cholesterol medication could have an effect on patient feeling tired.    Immunizations    Patient would like to discuss with provider if he can have his Shingles vaccine here.   FATIGUE Duration:  3 months Severity: moderate  Onset: sudden Context when symptoms started:  unknown Symptoms improve with rest: no  Depressive symptoms: no Stress/anxiety: no Insomnia: no  Snoring: no Observed apnea by bed partner: no Daytime hypersomnolence:no Wakes feeling refreshed: no History of sleep study: no Dysnea on exertion:  no Orthopnea/PND: yes Chest pain: no Chronic cough: yes Lower extremity edema: no Arthralgias:no Myalgias: yes Weakness: yes Rash: no  Relevant past medical, surgical, family and social history reviewed and updated as indicated. Interim medical history since our last visit reviewed. Allergies and medications reviewed and updated.  Review of Systems  Constitutional:  Positive for fatigue. Negative for activity change, appetite change, chills, diaphoresis, fever and unexpected weight change.  HENT: Negative.    Respiratory: Negative.    Cardiovascular: Negative.   Gastrointestinal: Negative.   Musculoskeletal: Negative.   Neurological: Negative.   Psychiatric/Behavioral: Negative.     Per HPI unless specifically indicated above     Objective:    BP (!)  89/59   Pulse 65   Temp 98.6 F (37 C)   Wt 205 lb (93 kg)   SpO2 92%   BMI 27.05 kg/m   Wt Readings from Last 3 Encounters:  12/16/20 205 lb (93 kg)  10/23/20 205 lb (93 kg)  09/08/20 204 lb 8 oz (92.8 kg)    Physical Exam Vitals and nursing note reviewed.  Constitutional:      General: He is not in acute distress.    Appearance: Normal appearance. He is not ill-appearing, toxic-appearing or diaphoretic.  HENT:     Head: Normocephalic and atraumatic.     Comments: Neck stoma in place    Right Ear: External ear normal.     Left Ear: External ear normal.     Nose: Nose normal.     Mouth/Throat:     Mouth: Mucous membranes are moist.     Pharynx: Oropharynx is clear.  Eyes:     General: No scleral icterus.       Right eye: No discharge.        Left eye: No discharge.     Extraocular Movements: Extraocular movements intact.     Conjunctiva/sclera: Conjunctivae normal.     Pupils: Pupils are equal, round, and reactive to light.  Cardiovascular:     Rate and Rhythm: Normal rate and regular rhythm.     Pulses: Normal pulses.     Heart sounds: Normal heart sounds. No murmur heard.   No friction rub. No  gallop.  Pulmonary:     Effort: Pulmonary effort is normal. No respiratory distress.     Breath sounds: Normal breath sounds. No stridor. No wheezing, rhonchi or rales.  Chest:     Chest wall: No tenderness.  Musculoskeletal:        General: Normal range of motion.     Cervical back: Normal range of motion and neck supple.  Skin:    General: Skin is warm and dry.     Capillary Refill: Capillary refill takes less than 2 seconds.     Coloration: Skin is not jaundiced or pale.     Findings: No bruising, erythema, lesion or rash.  Neurological:     General: No focal deficit present.     Mental Status: He is alert and oriented to person, place, and time. Mental status is at baseline.  Psychiatric:        Mood and Affect: Mood normal.        Behavior: Behavior normal.         Thought Content: Thought content normal.        Judgment: Judgment normal.    Results for orders placed or performed in visit on 05/19/20  TSH  Result Value Ref Range   TSH 2.440 0.450 - 4.500 uIU/mL  CBC with Differential/Platelet  Result Value Ref Range   WBC 5.8 3.4 - 10.8 x10E3/uL   RBC 4.31 4.14 - 5.80 x10E6/uL   Hemoglobin 12.8 (L) 13.0 - 17.7 g/dL   Hematocrit 39.8 37.5 - 51.0 %   MCV 92 79 - 97 fL   MCH 29.7 26.6 - 33.0 pg   MCHC 32.2 31.5 - 35.7 g/dL   RDW 13.1 11.6 - 15.4 %   Platelets 246 150 - 450 x10E3/uL   Neutrophils 65 Not Estab. %   Lymphs 20 Not Estab. %   Monocytes 8 Not Estab. %   Eos 6 Not Estab. %   Basos 1 Not Estab. %   Neutrophils Absolute 3.8 1.4 - 7.0 x10E3/uL   Lymphocytes Absolute 1.2 0.7 - 3.1 x10E3/uL   Monocytes Absolute 0.5 0.1 - 0.9 x10E3/uL   EOS (ABSOLUTE) 0.3 0.0 - 0.4 x10E3/uL   Basophils Absolute 0.0 0.0 - 0.2 x10E3/uL   Immature Granulocytes 0 Not Estab. %   Immature Grans (Abs) 0.0 0.0 - 0.1 x10E3/uL      Assessment & Plan:   Problem List Items Addressed This Visit       Cardiovascular and Mediastinum   AAA (abdominal aortic aneurysm) without rupture    Continue to monitor. Call with any concerns.       Thoracic ascending aortic aneurysm    Continue to monitor. Call with any concerns.         Respiratory   Centrilobular emphysema ([place])    Under good control on current regimen. Continue current regimen. Continue to monitor. Call with any concerns. Refills given. Labs drawn today.        Laryngeal carcinoma (HCC)    Stable. Continue to follow with oncology. Call with any concerns.       Relevant Orders   CBC with Differential/Platelet   Comprehensive metabolic panel     Endocrine   Hypothyroidism    Rechecking labs today. Await results. Treat as needed.       Relevant Orders   Comprehensive metabolic panel   TSH   IFG (impaired fasting glucose)    Rechecking labs today. Await results. Call with any  concerns.  Relevant Orders   Comprehensive metabolic panel   Microalbumin, Urine Waived   Urinalysis, Routine w reflex microscopic   Bayer DCA Hb A1c Waived     Nervous and Auditory   Weakness of both lower extremities    Rechecking labs today. Await results. Treat as needed.         Other   Pure hypercholesterolemia    Will hold cholesterol medicine for the next 2 weeks. Continue to monitor.        Relevant Orders   Comprehensive metabolic panel   Lipid Panel w/o Chol/HDL Ratio   Other Visit Diagnoses     Chronic fatigue    -  Primary   Likely multifactorial. Will start with checking labs. Await results. Encouraged better hydration given hypotension. Conitnue to monitor.    Relevant Orders   Lyme Disease Serology w/Reflex   Rocky mtn spotted fvr abs pnl(IgG+IgM)   Babesia microti Antibody Panel   Ehrlichia Antibody Panel   Hypotension, unspecified hypotension type       Encouraged hydration. Continue to monitor. Call with any concerns.    Vitamin D deficiency       Labs drawn today. Await results.   Relevant Orders   Comprehensive metabolic panel   VITAMIN D 25 Hydroxy (Vit-D Deficiency, Fractures)   B12 deficiency       Labs drawn today. Await results.   Relevant Orders   CBC with Differential/Platelet   Comprehensive metabolic panel   U54   Routine screening for STI (sexually transmitted infection)       Labs drawn today. Await results.    Relevant Orders   HIV Antibody (routine testing w rflx)   RPR   GC/Chlamydia Probe Amp   HSV(herpes simplex vrs) 1+2 ab-IgG   Acute Viral Hepatitis (HAV, HBV, HCV)   Needs flu shot       Flu shot given today.    Relevant Orders   Flu Vaccine QUAD High Dose(Fluad) (Completed)        Follow up plan: Return as scheduled.

## 2020-12-16 NOTE — Assessment & Plan Note (Signed)
Rechecking labs today. Await results. Call with any concerns.  

## 2020-12-16 NOTE — Assessment & Plan Note (Signed)
Under good control on current regimen. Continue current regimen. Continue to monitor. Call with any concerns. Refills given. Labs drawn today.   

## 2020-12-16 NOTE — Assessment & Plan Note (Addendum)
Will hold cholesterol medicine for the next 2 weeks. Continue to monitor.

## 2020-12-16 NOTE — Assessment & Plan Note (Signed)
Rechecking labs today. Await results. Treat as needed.  °

## 2020-12-17 LAB — URINALYSIS, ROUTINE W REFLEX MICROSCOPIC
Bilirubin, UA: NEGATIVE
Glucose, UA: NEGATIVE
Leukocytes,UA: NEGATIVE
Nitrite, UA: NEGATIVE
RBC, UA: NEGATIVE
Specific Gravity, UA: >1.03 — ABNORMAL HIGH (ref 1.005–1.030)
Urobilinogen, Ur: 0.2 mg/dL (ref 0.2–1.0)
pH, UA: 5.5 (ref 5.0–7.5)

## 2020-12-17 LAB — MICROSCOPIC EXAMINATION

## 2020-12-17 LAB — HCV INTERPRETATION

## 2020-12-17 LAB — ACUTE VIRAL HEPATITIS (HAV, HBV, HCV)
HCV Ab: <0.1 s/co ratio (ref 0.0–0.9)
Hep A IgM: NEGATIVE
Hep B C IgM: NEGATIVE
Hepatitis B Surface Ag: NEGATIVE

## 2020-12-17 LAB — MICROALBUMIN, URINE WAIVED
Creatinine, Urine Waived: 300 mg/dL (ref 10–300)
Microalb, Ur Waived: 150 mg/L — ABNORMAL HIGH (ref 0–19)

## 2020-12-17 LAB — LYME DISEASE SEROLOGY W/REFLEX: Lyme Total Antibody EIA: NEGATIVE

## 2020-12-17 LAB — BAYER DCA HB A1C WAIVED: HB A1C (BAYER DCA - WAIVED): 6.2 % — ABNORMAL HIGH (ref 4.8–5.6)

## 2020-12-19 LAB — GC/CHLAMYDIA PROBE AMP
Chlamydia trachomatis, NAA: NEGATIVE
Neisseria Gonorrhoeae by PCR: NEGATIVE

## 2020-12-22 LAB — CBC WITH DIFFERENTIAL/PLATELET
Basophils Absolute: 0 10*3/uL (ref 0.0–0.2)
Basos: 0 %
EOS (ABSOLUTE): 0.2 10*3/uL (ref 0.0–0.4)
Eos: 2 %
Hematocrit: 41.3 % (ref 37.5–51.0)
Hemoglobin: 13.7 g/dL (ref 13.0–17.7)
Immature Grans (Abs): 0 10*3/uL (ref 0.0–0.1)
Immature Granulocytes: 0 %
Lymphocytes Absolute: 1.6 10*3/uL (ref 0.7–3.1)
Lymphs: 16 %
MCH: 30.6 pg (ref 26.6–33.0)
MCHC: 33.2 g/dL (ref 31.5–35.7)
MCV: 92 fL (ref 79–97)
Monocytes Absolute: 0.5 10*3/uL (ref 0.1–0.9)
Monocytes: 5 %
Neutrophils Absolute: 7.6 10*3/uL — ABNORMAL HIGH (ref 1.4–7.0)
Neutrophils: 77 %
Platelets: 293 10*3/uL (ref 150–450)
RBC: 4.47 x10E6/uL (ref 4.14–5.80)
RDW: 12.7 % (ref 11.6–15.4)
WBC: 10 10*3/uL (ref 3.4–10.8)

## 2020-12-22 LAB — COMPREHENSIVE METABOLIC PANEL
ALT: 26 IU/L (ref 0–44)
AST: 32 IU/L (ref 0–40)
Albumin/Globulin Ratio: 2.2 (ref 1.2–2.2)
Albumin: 4.8 g/dL (ref 3.8–4.8)
Alkaline Phosphatase: 138 IU/L — ABNORMAL HIGH (ref 44–121)
BUN/Creatinine Ratio: 15 (ref 10–24)
BUN: 19 mg/dL (ref 8–27)
Bilirubin Total: 0.5 mg/dL (ref 0.0–1.2)
CO2: 26 mmol/L (ref 20–29)
Calcium: 9.8 mg/dL (ref 8.6–10.2)
Chloride: 101 mmol/L (ref 96–106)
Creatinine, Ser: 1.3 mg/dL — ABNORMAL HIGH (ref 0.76–1.27)
Globulin, Total: 2.2 g/dL (ref 1.5–4.5)
Glucose: 142 mg/dL — ABNORMAL HIGH (ref 70–99)
Potassium: 4 mmol/L (ref 3.5–5.2)
Sodium: 141 mmol/L (ref 134–144)
Total Protein: 7 g/dL (ref 6.0–8.5)
eGFR: 60 mL/min/{1.73_m2} (ref >59)

## 2020-12-22 LAB — HIV ANTIBODY (ROUTINE TESTING W REFLEX): HIV Screen 4th Generation wRfx: NONREACTIVE

## 2020-12-22 LAB — EHRLICHIA ANTIBODY PANEL
E. Chaffeensis (HME) IgM Titer: NEGATIVE
E.Chaffeensis (HME) IgG: NEGATIVE
HGE IgG Titer: NEGATIVE
HGE IgM Titer: NEGATIVE

## 2020-12-22 LAB — BABESIA MICROTI ANTIBODY PANEL
Babesia microti IgG: <1:10 {titer}
Babesia microti IgM: <1:10 {titer}

## 2020-12-22 LAB — VITAMIN B12: Vitamin B-12: 825 pg/mL (ref 232–1245)

## 2020-12-22 LAB — RMSF, IGG, IFA: RMSF, IGG, IFA: <1:64 {titer}

## 2020-12-22 LAB — ROCKY MTN SPOTTED FVR ABS PNL(IGG+IGM)
RMSF IgG: POSITIVE — AB
RMSF IgM: 0.62 index (ref 0.00–0.89)

## 2020-12-22 LAB — HSV(HERPES SIMPLEX VRS) I + II AB-IGG
HSV 1 Glycoprotein G Ab, IgG: 1.15 index — ABNORMAL HIGH (ref 0.00–0.90)
HSV 2 IgG, Type Spec: <0.91 index (ref 0.00–0.90)

## 2020-12-22 LAB — VITAMIN D 25 HYDROXY (VIT D DEFICIENCY, FRACTURES): Vit D, 25-Hydroxy: 41.1 ng/mL (ref 30.0–100.0)

## 2020-12-22 LAB — LIPID PANEL W/O CHOL/HDL RATIO
Cholesterol, Total: 141 mg/dL (ref 100–199)
HDL: 33 mg/dL — ABNORMAL LOW (ref >39)
LDL Chol Calc (NIH): 64 mg/dL (ref 0–99)
Triglycerides: 274 mg/dL — ABNORMAL HIGH (ref 0–149)
VLDL Cholesterol Cal: 44 mg/dL — ABNORMAL HIGH (ref 5–40)

## 2020-12-22 LAB — RPR: RPR Ser Ql: NONREACTIVE

## 2020-12-22 LAB — TSH: TSH: 3.78 u[IU]/mL (ref 0.450–4.500)

## 2020-12-30 ENCOUNTER — Other Ambulatory Visit: Payer: Self-pay

## 2020-12-30 ENCOUNTER — Encounter: Payer: Self-pay | Admitting: Family Medicine

## 2020-12-30 ENCOUNTER — Ambulatory Visit (INDEPENDENT_AMBULATORY_CARE_PROVIDER_SITE_OTHER): Payer: Medicare HMO | Admitting: Family Medicine

## 2020-12-30 VITALS — BP 138/90 | HR 67 | Temp 98.4°F | Ht 72.99 in | Wt 213.8 lb

## 2020-12-30 DIAGNOSIS — E7800 Pure hypercholesterolemia, unspecified: Secondary | ICD-10-CM

## 2020-12-30 DIAGNOSIS — R6883 Chills (without fever): Secondary | ICD-10-CM

## 2020-12-30 DIAGNOSIS — R5382 Chronic fatigue, unspecified: Secondary | ICD-10-CM | POA: Diagnosis not present

## 2020-12-30 MED ORDER — ROSUVASTATIN CALCIUM 5 MG PO TABS: 5.0000 mg | ORAL_TABLET | ORAL | 0 refills | Status: DC

## 2020-12-30 NOTE — Assessment & Plan Note (Signed)
Will change to 5mg  crestor 2x a week to see if he tolerates it. Rx sent to his pharmacy. Call with any concerns.

## 2020-12-30 NOTE — Progress Notes (Signed)
BP 138/90    Pulse 67    Temp 98.4 F (36.9 C) (Oral)    Ht 6' 0.99" (1.854 m)    Wt 213 lb 12.8 oz (97 kg)    SpO2 94%    BMI 28.21 kg/m    Subjective:    Patient ID: Luke [name], male    DOB: 07-Nov-1950, 70 y.o.   MRN: [id number]  HPI: BRION SOSSAMON is a 70 y.o. male  Chief Complaint  Patient presents with   Hyperlipidemia   chronic fatigue   HYPERLIPIDEMIA Hyperlipidemia status: stable Satisfied with current treatment?  yes Side effects:  no Medication compliance: excellent compliance Past cholesterol meds: crestor Supplements: none Aspirin:  no The ASCVD Risk score (Arnett DK, et al., 2019) failed to calculate for the following reasons:   The patient has a prior MI or stroke diagnosis Chest pain:  no Coronary artery disease:  yes  Relevant past medical, surgical, family and social history reviewed and updated as indicated. Interim medical history since our last visit reviewed. Allergies and medications reviewed and updated.  Review of Systems  Constitutional: Negative.   Respiratory: Negative.    Cardiovascular: Negative.   Musculoskeletal: Negative.   Neurological: Negative.   Psychiatric/Behavioral: Negative.     Per HPI unless specifically indicated above     Objective:    BP 138/90    Pulse 67    Temp 98.4 F (36.9 C) (Oral)    Ht 6' 0.99" (1.854 m)    Wt 213 lb 12.8 oz (97 kg)    SpO2 94%    BMI 28.21 kg/m   Wt Readings from Last 3 Encounters:  12/30/20 213 lb 12.8 oz (97 kg)  12/16/20 205 lb (93 kg)  10/23/20 205 lb (93 kg)    Physical Exam Vitals and nursing note reviewed.  Constitutional:      General: He is not in acute distress.    Appearance: Normal appearance. He is not ill-appearing, toxic-appearing or diaphoretic.  HENT:     Head: Normocephalic and atraumatic.     Right Ear: External ear normal.     Left Ear: External ear normal.     Nose: Nose normal.     Mouth/Throat:     Mouth: Mucous membranes are moist.     Pharynx:  Oropharynx is clear.  Eyes:     General: No scleral icterus.       Right eye: No discharge.        Left eye: No discharge.     Extraocular Movements: Extraocular movements intact.     Conjunctiva/sclera: Conjunctivae normal.     Pupils: Pupils are equal, round, and reactive to light.  Cardiovascular:     Rate and Rhythm: Normal rate and regular rhythm.     Pulses: Normal pulses.     Heart sounds: Normal heart sounds. No murmur heard.   No friction rub. No gallop.  Pulmonary:     Effort: Pulmonary effort is normal. No respiratory distress.     Breath sounds: Normal breath sounds. No stridor. No wheezing, rhonchi or rales.  Chest:     Chest wall: No tenderness.  Musculoskeletal:        General: Normal range of motion.     Cervical back: Normal range of motion and neck supple.  Skin:    General: Skin is warm and dry.     Capillary Refill: Capillary refill takes less than 2 seconds.     Coloration: Skin is not  jaundiced or pale.     Findings: No bruising, erythema, lesion or rash.  Neurological:     General: No focal deficit present.     Mental Status: He is alert and oriented to person, place, and time. Mental status is at baseline.  Psychiatric:        Mood and Affect: Mood normal.        Behavior: Behavior normal.        Thought Content: Thought content normal.        Judgment: Judgment normal.    Results for orders placed or performed in visit on 12/16/20  GC/Chlamydia Probe Amp   Specimen: Urine   UR  Result Value Ref Range   Chlamydia trachomatis, NAA Negative Negative   Neisseria Gonorrhoeae by PCR Negative Negative  Microscopic Examination   Urine  Result Value Ref Range   WBC, UA 0-5 0 - 5 /hpf   RBC 0-2 0 - 2 /hpf   Epithelial Cells (non renal) 0-10 0 - 10 /hpf   Casts Present (A) None seen /lpf   Cast Type Hyaline casts N/A   Crystals Present (A) N/A   Crystal Type Calcium Oxalate N/A   Mucus, UA Present (A) Not Estab.   Bacteria, UA Few None seen/Few   CBC with Differential/Platelet  Result Value Ref Range   WBC 10.0 3.4 - 10.8 x10E3/uL   RBC 4.47 4.14 - 5.80 x10E6/uL   Hemoglobin 13.7 13.0 - 17.7 g/dL   Hematocrit 41.3 37.5 - 51.0 %   MCV 92 79 - 97 fL   MCH 30.6 26.6 - 33.0 pg   MCHC 33.2 31.5 - 35.7 g/dL   RDW 12.7 11.6 - 15.4 %   Platelets 293 150 - 450 x10E3/uL   Neutrophils 77 Not Estab. %   Lymphs 16 Not Estab. %   Monocytes 5 Not Estab. %   Eos 2 Not Estab. %   Basos 0 Not Estab. %   Neutrophils Absolute 7.6 (H) 1.4 - 7.0 x10E3/uL   Lymphocytes Absolute 1.6 0.7 - 3.1 x10E3/uL   Monocytes Absolute 0.5 0.1 - 0.9 x10E3/uL   EOS (ABSOLUTE) 0.2 0.0 - 0.4 x10E3/uL   Basophils Absolute 0.0 0.0 - 0.2 x10E3/uL   Immature Granulocytes 0 Not Estab. %   Immature Grans (Abs) 0.0 0.0 - 0.1 x10E3/uL  Comprehensive metabolic panel  Result Value Ref Range   Glucose 142 (H) 70 - 99 mg/dL   BUN 19 8 - 27 mg/dL   Creatinine, Ser 1.30 (H) 0.76 - 1.27 mg/dL   eGFR 60 >59 mL/min/1.73   BUN/Creatinine Ratio 15 10 - 24   Sodium 141 134 - 144 mmol/L   Potassium 4.0 3.5 - 5.2 mmol/L   Chloride 101 96 - 106 mmol/L   CO2 26 20 - 29 mmol/L   Calcium 9.8 8.6 - 10.2 mg/dL   Total Protein 7.0 6.0 - 8.5 g/dL   Albumin 4.8 3.8 - 4.8 g/dL   Globulin, Total 2.2 1.5 - 4.5 g/dL   Albumin/Globulin Ratio 2.2 1.2 - 2.2   Bilirubin Total 0.5 0.0 - 1.2 mg/dL   Alkaline Phosphatase 138 (H) 44 - 121 IU/L   AST 32 0 - 40 IU/L   ALT 26 0 - 44 IU/L  Lipid Panel w/o Chol/HDL Ratio  Result Value Ref Range   Cholesterol, Total 141 100 - 199 mg/dL   Triglycerides 274 (H) 0 - 149 mg/dL   HDL 33 (L) >39 mg/dL   VLDL Cholesterol Cal 44 (  H) 5 - 40 mg/dL   LDL Chol Calc (NIH) 64 0 - 99 mg/dL  Microalbumin, Urine Waived  Result Value Ref Range   Microalb, Ur Waived 150 (H) 0 - 19 mg/L   Creatinine, Urine Waived 300 10 - 300 mg/dL   Microalb/Creat Ratio 30-300 (H) <30 mg/g  TSH  Result Value Ref Range   TSH 3.780 0.450 - 4.500 uIU/mL  VITAMIN D 25 Hydroxy  (Vit-D Deficiency, Fractures)  Result Value Ref Range   Vit D, 25-Hydroxy 41.1 30.0 - 100.0 ng/mL  Urinalysis, Routine w reflex microscopic  Result Value Ref Range   Specific Gravity, UA >1.030 (H) 1.005 - 1.030   pH, UA 5.5 5.0 - 7.5   Color, UA Orange Yellow   Appearance Ur Cloudy (A) Clear   Leukocytes,UA Negative Negative   Protein,UA 2+ (A) Negative/Trace   Glucose, UA Negative Negative   Ketones, UA 1+ (A) Negative   RBC, UA Negative Negative   Bilirubin, UA Negative Negative   Urobilinogen, Ur 0.2 0.2 - 1.0 mg/dL   Nitrite, UA Negative Negative   Microscopic Examination See below:   B12  Result Value Ref Range   Vitamin B-12 825 232 - 1,245 pg/mL  Lyme Disease Serology w/Reflex  Result Value Ref Range   Lyme Total Antibody EIA Negative Negative  Rocky mtn spotted fvr abs pnl(IgG+IgM)  Result Value Ref Range   RMSF IgG Positive (A) Negative   RMSF IgM 0.62 0.00 - 0.89 index  Babesia microti Antibody Panel  Result Value Ref Range   Babesia microti IgM <1:10 Neg:<1:10   Babesia microti IgG <7:51 [facility]:<8:52  Ehrlichia Antibody Panel  Result Value Ref Range   E.Chaffeensis (HME) IgG Negative Neg:<1:64   E. Chaffeensis (HME) IgM Titer Negative Neg:<1:20   HGE IgG Titer Negative Neg:<1:64   HGE IgM Titer Negative Neg:<1:20  HIV Antibody (routine testing w rflx)  Result Value Ref Range   HIV Screen 4th Generation wRfx Non Reactive Non Reactive  RPR  Result Value Ref Range   RPR Ser Ql Non Reactive Non Reactive  HSV(herpes simplex vrs) 1+2 ab-IgG  Result Value Ref Range   HSV 1 Glycoprotein G Ab, IgG 1.15 (H) 0.00 - 0.90 index   HSV 2 IgG, Type Spec <0.91 0.00 - 0.90 index  Acute Viral Hepatitis (HAV, HBV, HCV)  Result Value Ref Range   Hep A IgM Negative Negative   Hepatitis B Surface Ag Negative Negative   Hep B C IgM Negative Negative   HCV Ab <0.1 0.0 - 0.9 s/co ratio  Bayer DCA Hb A1c Waived  Result Value Ref Range   HB A1C (BAYER DCA - WAIVED) 6.2 (H) 4.8 -  5.6 %  Interpretation:  Result Value Ref Range   HCV Interp 1: Comment   RMSF, IgG, IFA  Result Value Ref Range   RMSF, IGG, IFA <1:64 Neg <1:64      Assessment & Plan:   Problem List Items Addressed This Visit       Other   Pure hypercholesterolemia    Will change to 45m crestor 2x a week to see if he tolerates it. Rx sent to his pharmacy. Call with any concerns.       Relevant Medications   rosuvastatin (CRESTOR) 5 MG tablet (Start on 01/01/2021)   Other Visit Diagnoses     Chronic fatigue    -  Primary   Doing much better off the cholesterol medicine. Continue to monitor. Call with any concerns.  Chills       Labs normal 2 weeks ago. Will check urine again. Await results. Treat as needed.    Relevant Orders   Urinalysis, Routine w reflex microscopic        Follow up plan: No follow-ups on file.

## 2020-12-31 ENCOUNTER — Ambulatory Visit: Payer: Medicare HMO | Admitting: Family Medicine

## 2021-01-02 DIAGNOSIS — Z8579 Personal history of other malignant neoplasms of lymphoid, hematopoietic and related tissues: Secondary | ICD-10-CM | POA: Diagnosis not present

## 2021-01-02 DIAGNOSIS — Z923 Personal history of irradiation: Secondary | ICD-10-CM | POA: Diagnosis not present

## 2021-01-02 DIAGNOSIS — Z9002 Acquired absence of larynx: Secondary | ICD-10-CM | POA: Diagnosis not present

## 2021-01-02 DIAGNOSIS — R491 Aphonia: Secondary | ICD-10-CM | POA: Diagnosis not present

## 2021-01-22 DIAGNOSIS — C760 Malignant neoplasm of head, face and neck: Secondary | ICD-10-CM | POA: Diagnosis not present

## 2021-01-22 DIAGNOSIS — Z8521 Personal history of malignant neoplasm of larynx: Secondary | ICD-10-CM | POA: Diagnosis not present

## 2021-01-22 DIAGNOSIS — Z87891 Personal history of nicotine dependence: Secondary | ICD-10-CM | POA: Diagnosis not present

## 2021-01-22 DIAGNOSIS — Z720 Tobacco use: Secondary | ICD-10-CM | POA: Diagnosis not present

## 2021-01-22 DIAGNOSIS — Z923 Personal history of irradiation: Secondary | ICD-10-CM | POA: Diagnosis not present

## 2021-01-22 DIAGNOSIS — C321 Malignant neoplasm of supraglottis: Secondary | ICD-10-CM | POA: Diagnosis not present

## 2021-01-22 DIAGNOSIS — R918 Other nonspecific abnormal finding of lung field: Secondary | ICD-10-CM | POA: Diagnosis not present

## 2021-01-22 DIAGNOSIS — C329 Malignant neoplasm of larynx, unspecified: Secondary | ICD-10-CM | POA: Diagnosis not present

## 2021-01-22 DIAGNOSIS — E039 Hypothyroidism, unspecified: Secondary | ICD-10-CM | POA: Diagnosis not present

## 2021-01-27 DIAGNOSIS — Z923 Personal history of irradiation: Secondary | ICD-10-CM | POA: Insufficient documentation

## 2021-02-20 IMAGING — CT CT CHEST LUNG CANCER SCREENING LOW DOSE W/O CM
2 of 5 series · 14 of 40 positions shown, 17 images · non-contrast
Comparison: Low-dose lung cancer screening chest CT 11/30/2017.

CLINICAL DATA: 68-year-old male current smoker with 93 pack-year
history of smoking. Lung cancer screening examination.

EXAM:
CT CHEST WITHOUT CONTRAST LOW-DOSE FOR LUNG CANCER SCREENING
TECHNIQUE: Multidetector CT imaging of the chest was performed following the
standard protocol without IV contrast.

[Series 3: lung 1.00 · axial · 0.77mm/px · z∈[-1191,-883]mm · 11 of 344 slices shown, 14 images]
[im 18/344  mediastinal]
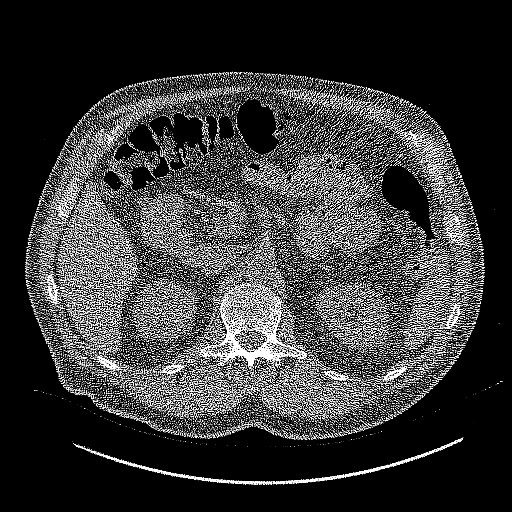
[im 18/344  lung]
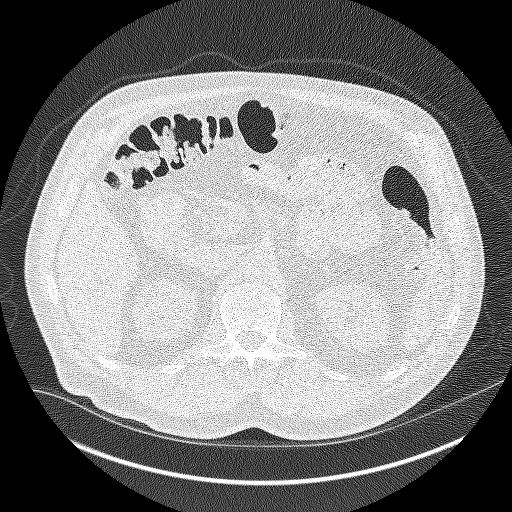
[im 52/344  lung]
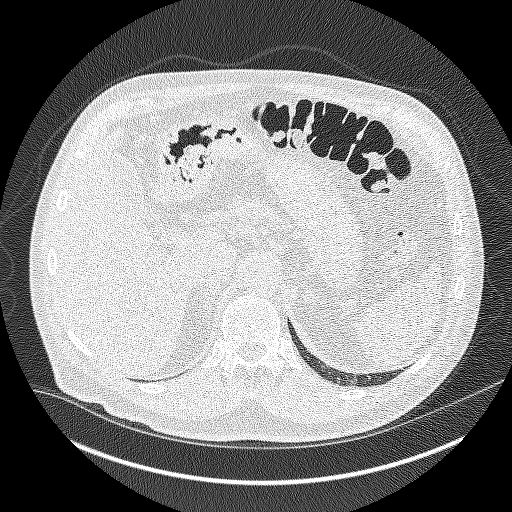
[im 86/344  lung]
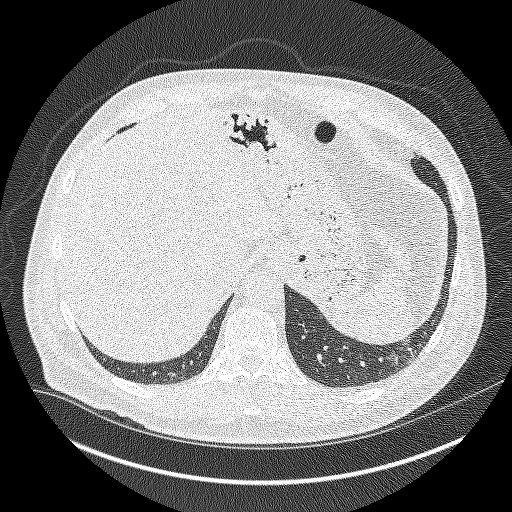
[im 121/344  lung]
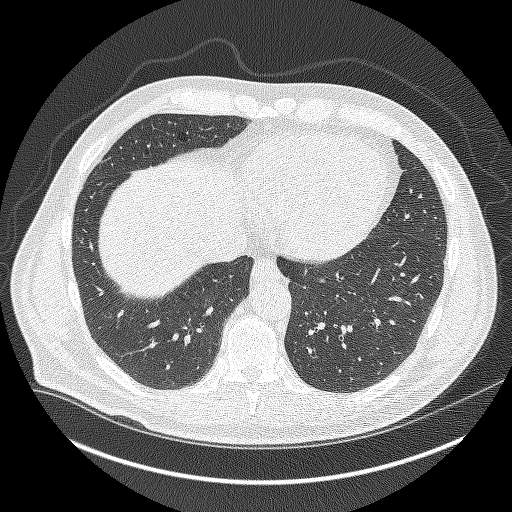
[im 138/344  mediastinal]
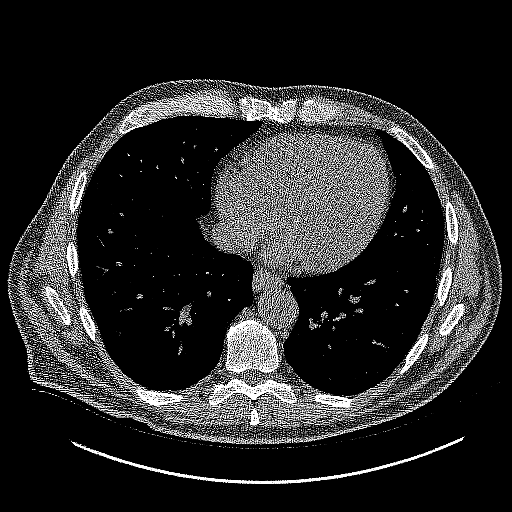
[im 138/344  lung]
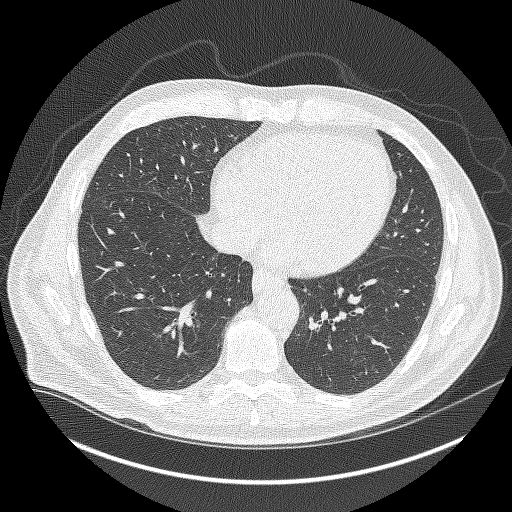
[im 172/344  lung]
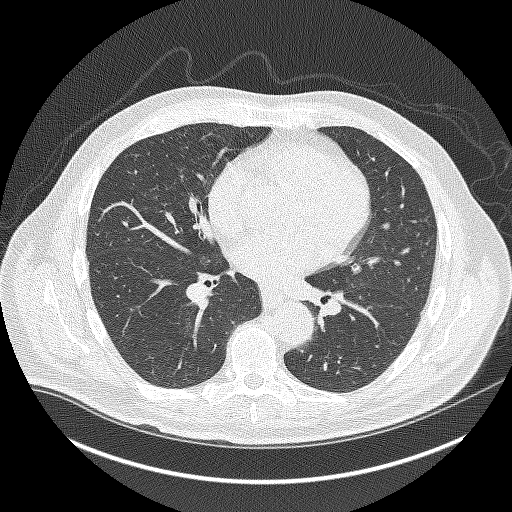
[im 206/344  lung]
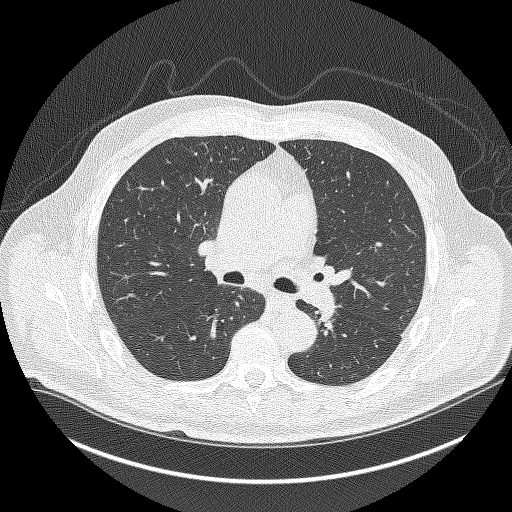
[im 223/344  lung]
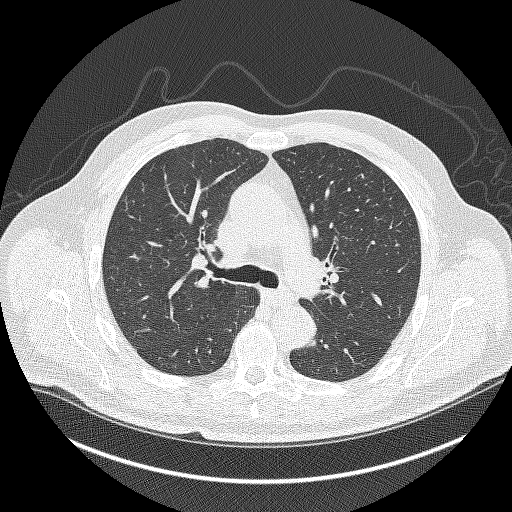
[im 258/344  mediastinal]
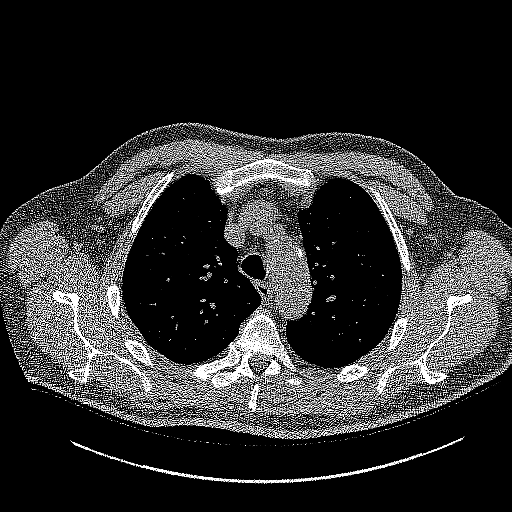
[im 258/344  lung]
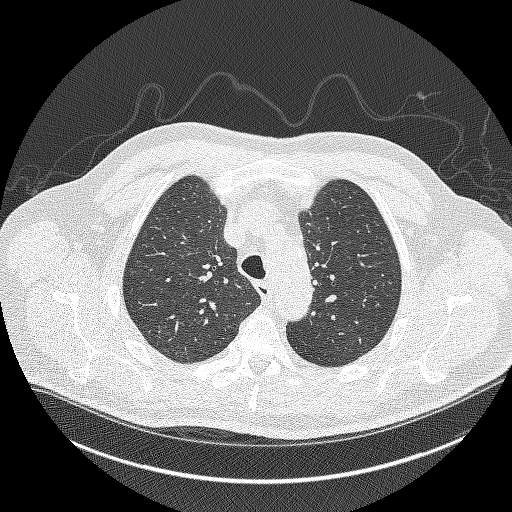
[im 292/344  lung]
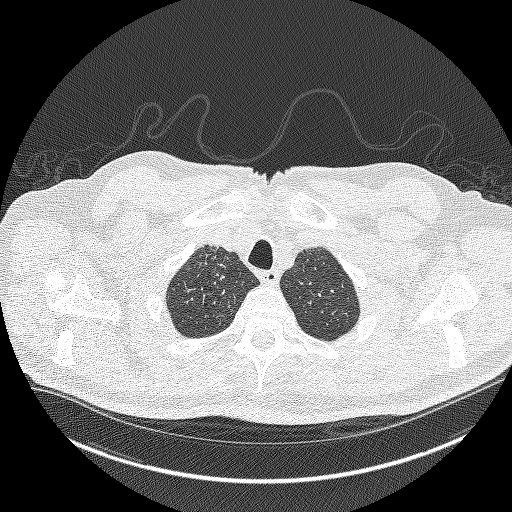
[im 326/344  lung]
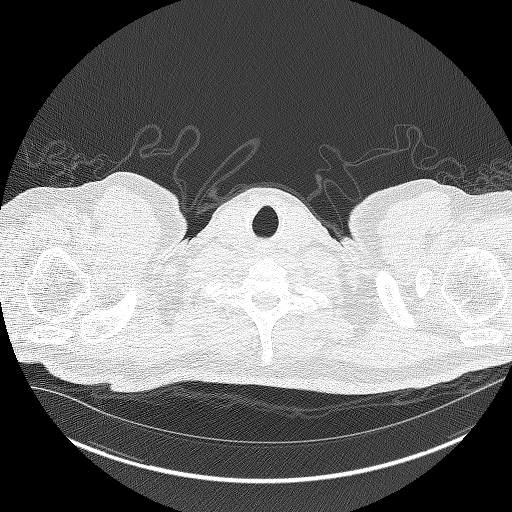

[Series 4: coronals lung 1.00 cor · coronal · 0.67mm/px · 3 of 355 slices shown]
[im 71/355  lung]
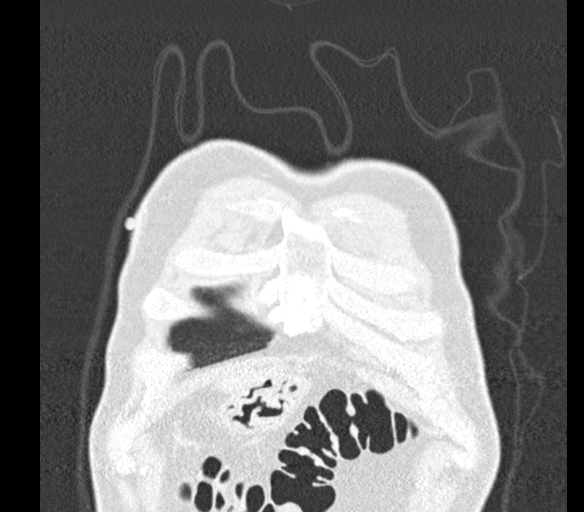
[im 142/355  lung]
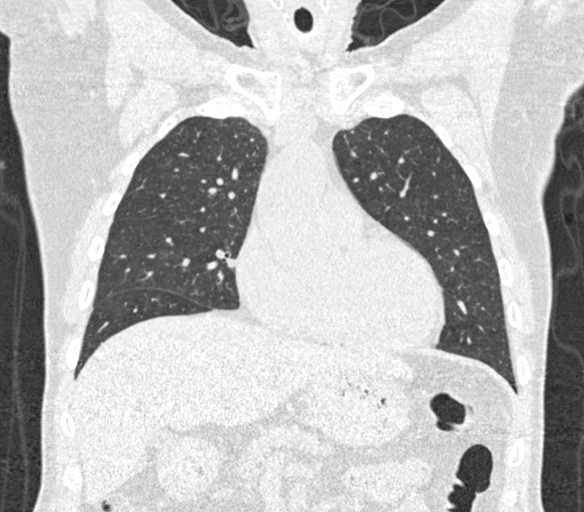
[im 213/355  lung]
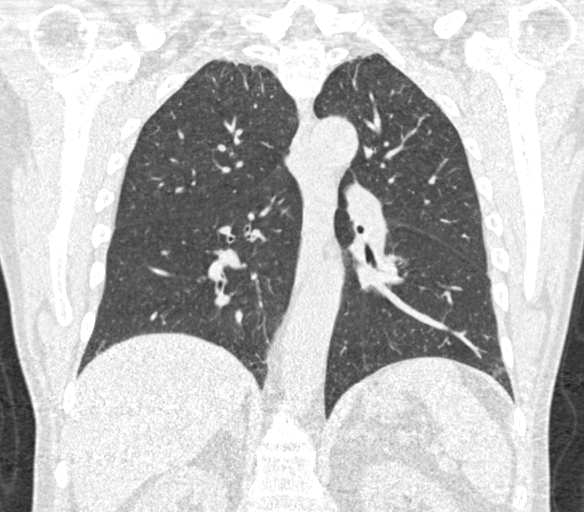

[14 of 40 positions shown; findings below may reference images not displayed]

FINDINGS: Cardiovascular: Heart size is normal. There is no significant
pericardial fluid, thickening or pericardial calcification. There is
aortic atherosclerosis, as well as atherosclerosis of the great
vessels of the mediastinum and the coronary arteries, including
calcified atherosclerotic plaque in the left main, left anterior
descending, left circumflex and right coronary arteries. Ectasia of
the ascending thoracic aorta (4.3 cm in diameter).

Mediastinum/Nodes: No pathologically enlarged mediastinal or hilar
lymph nodes. Please note that accurate exclusion of hilar adenopathy
is limited on noncontrast CT scans. Esophagus is unremarkable in
appearance. No axillary lymphadenopathy.

Lungs/Pleura: No suspicious appearing pulmonary nodules or masses
are noted. No acute consolidative airspace disease. No pleural
effusions. Mild diffuse bronchial wall thickening with mild
centrilobular and paraseptal emphysema. Mild bilateral apical
nodular pleuroparenchymal thickening and architectural distortion,
most compatible with areas of chronic post infectious or
inflammatory scarring.

Upper Abdomen: Aortic atherosclerosis.

Musculoskeletal: There are no aggressive appearing lytic or blastic
lesions noted in the visualized portions of the skeleton.
IMPRESSION: 1. Lung-RADS 1S, negative. Continue annual screening with low-dose
chest CT without contrast in 12 months.
2. The "S" modifier above refers to potentially clinically
significant non lung cancer related findings. Specifically, there is
aortic atherosclerosis, in addition to left main and 3 vessel
coronary artery disease. Please note that although the presence of
coronary artery calcium documents the presence of coronary artery
disease, the severity of this disease and any potential stenosis
cannot be assessed on this non-gated CT examination. Assessment for
potential risk factor modification, dietary therapy or pharmacologic
therapy may be warranted, if clinically indicated.
3. Ectasia of ascending thoracic aorta (4.3 cm in diameter).
Attention at time of repeat annual lung cancer screening chest CT is
recommended to ensure continued stability.
4. Mild diffuse bronchial wall thickening with mild centrilobular
and paraseptal emphysema; imaging findings suggestive of underlying
COPD.

Aortic Atherosclerosis (DB13U-EMD.D) and Emphysema (DB13U-ZBZ.P).

## 2021-02-27 DIAGNOSIS — J0410 Acute tracheitis without obstruction: Secondary | ICD-10-CM | POA: Diagnosis not present

## 2021-02-27 DIAGNOSIS — C329 Malignant neoplasm of larynx, unspecified: Secondary | ICD-10-CM | POA: Diagnosis not present

## 2021-02-27 DIAGNOSIS — Z9002 Acquired absence of larynx: Secondary | ICD-10-CM | POA: Diagnosis not present

## 2021-03-02 DIAGNOSIS — R491 Aphonia: Secondary | ICD-10-CM | POA: Diagnosis not present

## 2021-03-02 DIAGNOSIS — Z8521 Personal history of malignant neoplasm of larynx: Secondary | ICD-10-CM | POA: Diagnosis not present

## 2021-03-02 DIAGNOSIS — Z9002 Acquired absence of larynx: Secondary | ICD-10-CM | POA: Diagnosis not present

## 2021-03-19 DIAGNOSIS — C329 Malignant neoplasm of larynx, unspecified: Secondary | ICD-10-CM | POA: Diagnosis not present

## 2021-03-19 DIAGNOSIS — Z8521 Personal history of malignant neoplasm of larynx: Secondary | ICD-10-CM | POA: Diagnosis not present

## 2021-03-19 DIAGNOSIS — R491 Aphonia: Secondary | ICD-10-CM | POA: Diagnosis not present

## 2021-03-24 DIAGNOSIS — R491 Aphonia: Secondary | ICD-10-CM | POA: Diagnosis not present

## 2021-03-24 DIAGNOSIS — J0410 Acute tracheitis without obstruction: Secondary | ICD-10-CM | POA: Diagnosis not present

## 2021-04-09 DIAGNOSIS — L570 Actinic keratosis: Secondary | ICD-10-CM | POA: Diagnosis not present

## 2021-04-15 DIAGNOSIS — C329 Malignant neoplasm of larynx, unspecified: Secondary | ICD-10-CM | POA: Diagnosis not present

## 2021-04-15 DIAGNOSIS — L0291 Cutaneous abscess, unspecified: Secondary | ICD-10-CM | POA: Diagnosis not present

## 2021-04-27 ENCOUNTER — Other Ambulatory Visit: Payer: Self-pay | Admitting: Family Medicine

## 2021-04-27 DIAGNOSIS — K219 Gastro-esophageal reflux disease without esophagitis: Secondary | ICD-10-CM

## 2021-04-27 DIAGNOSIS — Z8572 Personal history of non-Hodgkin lymphomas: Secondary | ICD-10-CM

## 2021-04-27 NOTE — Telephone Encounter (Signed)
Requested Prescriptions  ?Pending Prescriptions Disp Refills  ?? omeprazole (PRILOSEC) 20 MG capsule [Pharmacy Med Name: OMEPRAZOLE 20 MG Capsule Delayed Release] 90 capsule 2  ?  Sig: TAKE 1 CAPSULE EVERY DAY BEFORE BREAKFAST  ?  ? Gastroenterology: Proton Pump Inhibitors Passed - 04/27/2021 10:42 AM  ?  ?  Passed - Valid encounter within last 12 months  ?  Recent Outpatient Visits   ?      ? 3 months ago Chronic fatigue  ? [place], Megan P, DO  ? 4 months ago Chronic fatigue  ? [place], Megan P, DO  ? 10 months ago Gastroesophageal reflux disease, unspecified whether esophagitis present  ? [place], [place] P, DO  ? 1 year ago Laryngeal carcinoma [facility])  ? [place], [place] P, DO  ? 1 year ago Laryngeal carcinoma [facility])  ? [place], [place] P, DO  ?  ?  ?Future Appointments   ?        ? In 1 month Johnson, Megan P, DO [facility]  ?  ? ?  ?  ?  ?? meloxicam (MOBIC) 15 MG tablet [Pharmacy Med Name: MELOXICAM 15 MG Tablet] 90 tablet 2  ?  Sig: TAKE 1 TABLET EVERY DAY  ?  ? Analgesics:  COX2 Inhibitors Failed - 04/27/2021 10:42 AM  ?  ?  Failed - Manual Review: Labs are only required if the patient has taken medication for more than 8 weeks.  ?  ?  Failed - Cr in normal range and within 360 days  ?  Creat  ?Date Value Ref Range Status  ?02/09/2019 1.05 0.70 - 1.25 mg/dL Final  ?  Comment:  ?  For patients >56 years of age, the reference limit ?for Creatinine is approximately 13% higher for people ?identified as African-American. ?. ?  ? ?Creatinine, Ser  ?Date Value Ref Range Status  ?12/16/2020 1.30 (H) 0.76 - 1.27 mg/dL Final  ?   ?  ?  Passed - HGB in normal range and within 360 days  ?  Hemoglobin  ?Date Value Ref Range Status  ?12/16/2020 13.7 13.0 - 17.7 g/dL Final  ?   ?  ?  Passed - HCT in normal range and within 360 days  ?  Hematocrit  ?Date Value Ref Range Status  ?12/16/2020  41.3 37.5 - 51.0 % Final  ?   ?  ?  Passed - AST in normal range and within 360 days  ?  AST  ?Date Value Ref Range Status  ?12/16/2020 32 0 - 40 IU/L Final  ? ?SGOT(AST)  ?Date Value Ref Range Status  ?08/20/2013 29 15 - 37 Unit/L Final  ?   ?  ?  Passed - ALT in normal range and within 360 days  ?  ALT  ?Date Value Ref Range Status  ?12/16/2020 26 0 - 44 IU/L Final  ? ?SGPT (ALT)  ?Date Value Ref Range Status  ?08/20/2013 34 U/L Final  ?  Comment:  ?  14-63 ?NOTE: New Reference Range ?07/31/13 ?  ?   ?  ?  Passed - eGFR is 30 or above and within 360 days  ?  GFR, Est African American  ?Date Value Ref Range Status  ?02/09/2019 85 > OR = 60 mL/min/1.61m Final  ? ?GFR calc Af Amer  ?Date Value Ref Range Status  ?09/07/2019 52 (L) >59 mL/min/1.73 Final  ?  Comment:  ?  **  [facility] currently reports eGFR in compliance with the current** ?  recommendations of the [employer]. [facility] will ?  update reporting as new guidelines are published from the NKF-ASN ?  Task force. ?  ? ?GFR, Est Non African American  ?Date Value Ref Range Status  ?02/09/2019 74 > OR = 60 mL/min/1.43m Final  ? ?GFR calc non Af Amer  ?Date Value Ref Range Status  ?09/07/2019 45 (L) >59 mL/min/1.73 Final  ? ?eGFR  ?Date Value Ref Range Status  ?12/16/2020 60 >59 mL/min/1.73 Final  ?   ?  ?  Passed - Patient is not pregnant  ?  ?  Passed - Valid encounter within last 12 months  ?  Recent Outpatient Visits   ?      ? 3 months ago Chronic fatigue  ? [place] Megan P, DO  ? 4 months ago Chronic fatigue  ? [place] Megan P, DO  ? 10 months ago Gastroesophageal reflux disease, unspecified whether esophagitis present  ? [place], [place]  ? 1 year ago Laryngeal carcinoma ([facility]  ? [place]  ? 1 year ago [facility]aryngeal carcinoma ([facility]  ? [place], [place]  ?  ?  ?Future Appointments   ?        ? In 1 month  Johnson, MBarb Merino DO [facility]  ?  ? ?  ?  ?  ? ?

## 2021-05-19 DIAGNOSIS — Z0120 Encounter for dental examination and cleaning without abnormal findings: Secondary | ICD-10-CM | POA: Diagnosis not present

## 2021-05-19 DIAGNOSIS — C329 Malignant neoplasm of larynx, unspecified: Secondary | ICD-10-CM | POA: Diagnosis not present

## 2021-05-19 DIAGNOSIS — Z923 Personal history of irradiation: Secondary | ICD-10-CM | POA: Diagnosis not present

## 2021-05-19 DIAGNOSIS — K1329 Other disturbances of oral epithelium, including tongue: Secondary | ICD-10-CM | POA: Diagnosis not present

## 2021-05-19 DIAGNOSIS — K029 Dental caries, unspecified: Secondary | ICD-10-CM | POA: Diagnosis not present

## 2021-05-19 DIAGNOSIS — Z0121 Encounter for dental examination and cleaning with abnormal findings: Secondary | ICD-10-CM | POA: Diagnosis not present

## 2021-05-19 DIAGNOSIS — Z87891 Personal history of nicotine dependence: Secondary | ICD-10-CM | POA: Diagnosis not present

## 2021-06-01 ENCOUNTER — Encounter: Payer: Self-pay | Admitting: Family Medicine

## 2021-06-01 ENCOUNTER — Ambulatory Visit
Admission: RE | Admit: 2021-06-01 | Discharge: 2021-06-01 | Disposition: A | Discharge status: Home / Self Care | Payer: Medicare HMO | Source: Ambulatory Visit | Attending: Family Medicine | Admitting: Family Medicine

## 2021-06-01 ENCOUNTER — Ambulatory Visit (INDEPENDENT_AMBULATORY_CARE_PROVIDER_SITE_OTHER): Payer: Medicare HMO | Admitting: Family Medicine

## 2021-06-01 ENCOUNTER — Ambulatory Visit
Admission: RE | Admit: 2021-06-01 | Discharge: 2021-06-01 | Disposition: A | Discharge status: Home / Self Care | Payer: Medicare HMO | Attending: Family Medicine | Admitting: Family Medicine

## 2021-06-01 VITALS — BP 131/84 | HR 58 | Temp 98.5°F | Wt 205.8 lb

## 2021-06-01 DIAGNOSIS — K219 Gastro-esophageal reflux disease without esophagitis: Secondary | ICD-10-CM

## 2021-06-01 DIAGNOSIS — M545 Low back pain, unspecified: Secondary | ICD-10-CM

## 2021-06-01 DIAGNOSIS — D692 Other nonthrombocytopenic purpura: Secondary | ICD-10-CM

## 2021-06-01 DIAGNOSIS — E039 Hypothyroidism, unspecified: Secondary | ICD-10-CM | POA: Diagnosis not present

## 2021-06-01 DIAGNOSIS — I10 Essential (primary) hypertension: Secondary | ICD-10-CM

## 2021-06-01 DIAGNOSIS — I69359 Hemiplegia and hemiparesis following cerebral infarction affecting unspecified side: Secondary | ICD-10-CM

## 2021-06-01 DIAGNOSIS — Z720 Tobacco use: Secondary | ICD-10-CM

## 2021-06-01 DIAGNOSIS — I7121 Aneurysm of the ascending aorta, without rupture: Secondary | ICD-10-CM | POA: Diagnosis not present

## 2021-06-01 DIAGNOSIS — E559 Vitamin D deficiency, unspecified: Secondary | ICD-10-CM | POA: Diagnosis not present

## 2021-06-01 DIAGNOSIS — E7800 Pure hypercholesterolemia, unspecified: Secondary | ICD-10-CM | POA: Diagnosis not present

## 2021-06-01 DIAGNOSIS — Z0000 Encounter for general adult medical examination without abnormal findings: Secondary | ICD-10-CM

## 2021-06-01 DIAGNOSIS — C329 Malignant neoplasm of larynx, unspecified: Secondary | ICD-10-CM

## 2021-06-01 DIAGNOSIS — I714 Abdominal aortic aneurysm, without rupture, unspecified: Secondary | ICD-10-CM

## 2021-06-01 DIAGNOSIS — I70213 Atherosclerosis of native arteries of extremities with intermittent claudication, bilateral legs: Secondary | ICD-10-CM | POA: Diagnosis not present

## 2021-06-01 DIAGNOSIS — G8929 Other chronic pain: Secondary | ICD-10-CM

## 2021-06-01 DIAGNOSIS — R3911 Hesitancy of micturition: Secondary | ICD-10-CM

## 2021-06-01 DIAGNOSIS — J432 Centrilobular emphysema: Secondary | ICD-10-CM | POA: Diagnosis not present

## 2021-06-01 DIAGNOSIS — R7301 Impaired fasting glucose: Secondary | ICD-10-CM | POA: Diagnosis not present

## 2021-06-01 LAB — MICROSCOPIC EXAMINATION
Bacteria, UA: NONE SEEN
RBC, Urine: NONE SEEN /hpf (ref 0–2)
WBC, UA: NONE SEEN /hpf (ref 0–5)

## 2021-06-01 LAB — MICROALBUMIN, URINE WAIVED
Creatinine, Urine Waived: 300 mg/dL (ref 10–300)
Microalb, Ur Waived: 80 mg/L — ABNORMAL HIGH (ref 0–19)

## 2021-06-01 LAB — URINALYSIS, ROUTINE W REFLEX MICROSCOPIC
Bilirubin, UA: NEGATIVE
Glucose, UA: NEGATIVE
Leukocytes,UA: NEGATIVE
Nitrite, UA: NEGATIVE
RBC, UA: NEGATIVE
Specific Gravity, UA: >1.03 — ABNORMAL HIGH (ref 1.005–1.030)
Urobilinogen, Ur: 1 mg/dL (ref 0.2–1.0)
pH, UA: 5.5 (ref 5.0–7.5)

## 2021-06-01 LAB — BAYER DCA HB A1C WAIVED: HB A1C (BAYER DCA - WAIVED): 6 % — ABNORMAL HIGH (ref 4.8–5.6)

## 2021-06-01 MED ORDER — DICLOFENAC SODIUM 1 % EX GEL: 4.0000 g | Freq: Four times a day (QID) | CUTANEOUS | 12 refills | Status: DC

## 2021-06-01 NOTE — Progress Notes (Signed)
BP 131/84   Pulse (!) 58   Temp 98.5 F (36.9 C)   Wt 205 lb 12.8 oz (93.4 kg)   SpO2 96%   BMI 27.16 kg/m    Subjective:    Patient ID: Luke [name], male    DOB: March 21, 1950, 71 y.o.   MRN: [id number]  HPI: Luke DEGROOTE is a 71 y.o. male presenting on 06/01/2021 for comprehensive medical examination. Current medical complaints include:  HYPERTENSION / HYPERLIPIDEMIA Satisfied with current treatment? {Blank single:19197::"yes","no"} Duration of hypertension: {Blank single:19197::"chronic","months","years"} BP monitoring frequency: {Blank single:19197::"not checking","rarely","daily","weekly","monthly","a few times a day","a few times a week","a few times a month"} BP range:  BP medication side effects: {Blank single:19197::"yes","no"} Past BP meds: {Blank MWUXLKGM:01027::"OZDG","UYQIHKVQQV","ZDGLOVFIEP/PIRJJOACZY","SAYTKZSW","FUXNATFTDD","UKGURKYHCW/CBJS","EGBTDVVOHY (bystolic)","carvedilol","chlorthalidone","clonidine","diltiazem","exforge HCT","HCTZ","irbesartan (avapro)","labetalol","lisinopril","lisinopril-HCTZ","losartan (cozaar)","methyldopa","nifedipine","olmesartan (benicar)","olmesartan-HCTZ","quinapril","ramipril","spironalactone","tekturna","valsartan","valsartan-HCTZ","verapamil"} Duration of hyperlipidemia: {Blank single:19197::"chronic","months","years"} Cholesterol medication side effects: {Blank single:19197::"yes","no"} Cholesterol supplements: {Blank multiple:19196::"none","fish oil","niacin","red yeast rice"} Past cholesterol medications: {Blank multiple:19196::"none","atorvastain (lipitor)","lovastatin (mevacor)","pravastatin (pravachol)","rosuvastatin (crestor)","simvastatin (zocor)","vytorin","fenofibrate (tricor)","gemfibrozil","ezetimide (zetia)","niaspan","lovaza"} Medication compliance: {Blank single:19197::"excellent compliance","good compliance","fair compliance","poor compliance"} Aspirin: {Blank single:19197::"yes","no"} Recent stressors: {Blank  single:19197::"yes","no"} Recurrent headaches: {Blank single:19197::"yes","no"} Visual changes: {Blank single:19197::"yes","no"} Palpitations: {Blank single:19197::"yes","no"} Dyspnea: {Blank single:19197::"yes","no"} Chest pain: {Blank single:19197::"yes","no"} Lower extremity edema: {Blank single:19197::"yes","no"} Dizzy/lightheaded: {Blank single:19197::"yes","no"}  HYPOTHYROIDISM Thyroid control status:{Blank single:19197::"controlled","uncontrolled","better","worse","exacerbated","stable"} Satisfied with current treatment? {Blank single:19197::"yes","no"} Medication side effects: {Blank single:19197::"yes","no"} Medication compliance: {Blank single:19197::"excellent compliance","good compliance","fair compliance","poor compliance"} Etiology of hypothyroidism:  Recent dose adjustment:{Blank single:19197::"yes","no"} Fatigue: {Blank single:19197::"yes","no"} Cold intolerance: {Blank single:19197::"yes","no"} Heat intolerance: {Blank single:19197::"yes","no"} Weight gain: {Blank single:19197::"yes","no"} Weight loss: {Blank single:19197::"yes","no"} Constipation: {Blank single:19197::"yes","no"} Diarrhea/loose stools: {Blank single:19197::"yes","no"} Palpitations: {Blank single:19197::"yes","no"} Lower extremity edema: {Blank single:19197::"yes","no"} Anxiety/depressed mood: {Blank single:19197::"yes","no"}  Impaired Fasting Glucose HbA1C:  Lab Results  Component Value Date   HGBA1C 6.2 (H) 12/16/2020   Duration of elevated blood sugar:  Polydipsia: {Blank single:19197::"yes","no"} Polyuria: {Blank single:19197::"yes","no"} Weight change: {Blank single:19197::"yes","no"} Visual disturbance: {Blank single:19197::"yes","no"} Glucose Monitoring: {Blank single:19197::"yes","no"}    Accucheck frequency: {Blank single:19197::"Not Checking","Daily","BID","TID"}    Fasting glucose:     Post prandial:  Diabetic Education: {Blank single:19197::"Completed","Not Completed"} Family  history of diabetes: {Blank single:19197::"yes","no"}  BACK PAIN Duration: {Blank single:19197::"days","weeks","months"} Mechanism of injury: {Blank single:19197::"lifting","MVA","no trauma","unknown"} Location: {Blank multiple:19196::"Right","Left","R>L","L>R","midline","bilateral","low back","upper back"} Onset: {Blank single:19197::"sudden","gradual"} Severity: {Blank single:19197::"mild","moderate","severe","1/10","2/10","3/10","4/10","5/10","6/10","7/10","8/10","9/10","10/10"} Quality: dull aching Frequency: constant Radiation: none Aggravating factors: first thing in the AM Alleviating factors: tylenol Status: worse Treatments attempted: {Blank multiple:19196::"none","rest","ice","heat","APAP","ibuprofen","aleve","physical therapy","HEP","OMM"}  Relief with NSAIDs?: {Blank single:19197::"No NSAIDs Taken","no","mild","moderate","significant"} Nighttime pain:  {Blank single:19197::"yes","no"} Paresthesias / decreased sensation:  {Blank single:19197::"yes","no"} Bowel / bladder incontinence:  {Blank single:19197::"yes","no"} Fevers:  {Blank single:19197::"yes","no"} Dysuria / urinary frequency:  {Blank single:19197::"yes","no"}   He currently lives with: Interim Problems from his last visit: {Blank single:19197::"yes","no"}  Depression Screen done today and results listed below:     06/01/2021   10:17 AM 12/30/2020   11:04 AM 12/15/2020   12:56 PM 12/14/2019   11:23 AM 10/12/2019   10:31 AM  Depression screen PHQ 2/9  Decreased Interest 0 0 0 0 1  Down, Depressed, Hopeless 0 0 0 0 0  PHQ - 2 Score 0 0 0 0 1  Altered sleeping 0 0   3  Tired, decreased energy 0    3  Change in appetite 0 0   0  Feeling bad or failure about yourself  0 0   0  Trouble concentrating 0 0   3  Moving slowly or fidgety/restless 0 0   1  Suicidal thoughts 0 0   0  PHQ-9 Score 0 0   11  Difficult doing work/chores Not difficult at all  Somewhat difficult   Past Medical History:  Past Medical  History:  Diagnosis Date  . Anxiety   . Arthritis   . Carcinoma metastatic to lymph node ([place])   . Cataract    right  . COPD (chronic obstructive pulmonary disease) ([place])   . Depression   . GERD (gastroesophageal reflux disease)   . Hoarseness of voice   . Personal history of tobacco use, presenting hazards to health February of 2001  . Thyroid disease     Surgical History:  Past Surgical History:  Procedure Laterality Date  . BALLOON DILATION N/A 01/30/2018   Procedure: BALLOON DILATION;  Surgeon: Mauri Pole, MD;  Location: [facility] ENDOSCOPY;  Service: Endoscopy;  Laterality: N/A;  . COLONOSCOPY    . COLONOSCOPY WITH PROPOFOL N/A 11/29/2016   Procedure: COLONOSCOPY WITH PROPOFOL;  Surgeon: Manya Silvas, MD;  Location: [facility] ENDOSCOPY;  Service: Endoscopy;  Laterality: N/A;  . COLONOSCOPY WITH PROPOFOL N/A 08/22/2017   Procedure: COLONOSCOPY WITH PROPOFOL;  Surgeon: Jonathon Bellows, MD;  Location: [facility] ENDOSCOPY;  Service: Gastroenterology;  Laterality: N/A;  . ESOPHAGEAL MANOMETRY N/A 10/05/2017   Procedure: ESOPHAGEAL MANOMETRY (EM);  Surgeon: Jonathon Bellows, MD;  Location: [facility] ENDOSCOPY;  Service: Gastroenterology;  Laterality: N/A;  . ESOPHAGEAL MANOMETRY N/A 01/30/2018   Procedure: ESOPHAGEAL MANOMETRY (EM);  Surgeon: Mauri Pole, MD;  Location: [facility] ENDOSCOPY;  Service: Endoscopy;  Laterality: N/A;  . ESOPHAGOGASTRODUODENOSCOPY N/A 03/09/2018   Procedure: ESOPHAGOGASTRODUODENOSCOPY (EGD);  Surgeon: Milus Banister, MD;  Location: [facility] END[facility];  Service: Endoscopy;  Laterality: N/A;  . ESOPHAGOGASTRODUODENOSCOPY (EGD) WITH PROPOFOL N/A 11/29/2016   Procedure: ESOPHAGOGASTRODUODENOSCOPY (EGD) WITH PROPOFOL;  Surgeon: Manya Silvas, MD;  Location: [facility] ENDOSCOPY;  Service: Endoscopy;  Laterality: N/A;  . ESOPHAGOGASTRODUODENOSCOPY (EGD) WITH PROPOFOL N/A 08/22/2017   Procedure: ESOPHAGOGASTRODUODENOSCOPY (EGD) WITH PROPOFOL;  Surgeon: Jonathon Bellows, MD;  Location: [facility]  ENDOSCOPY;  Service: Gastroenterology;  Laterality: N/A;  . ESOPHAGOGASTRODUODENOSCOPY (EGD) WITH PROPOFOL N/A 01/30/2018   Procedure: ESOPHAGOGASTRODUODENOSCOPY (EGD) WITH PROPOFOL;  Surgeon: Mauri Pole, MD;  Location: [facility] ENDOSCOPY;  Service: Endoscopy;  Laterality: N/A;  Mano probe to be placed during EGD  . EUS N/A 03/09/2018   Procedure: UPPER ENDOSCOPIC ULTRASOUND (EUS) RADIAL;  Surgeon: Milus Banister, MD;  Location: [facility] ENDOSCOPY;  Service: Endoscopy;  Laterality: N/A;  . NECK SURGERY     status post diagnosis of cancer with chemo and radiation  . SUBMUCOSAL INJECTION  03/09/2018   Procedure: SUBMUCOSAL INJECTION;  Surgeon: Milus Banister, MD;  Location: [facility] ENDOSCOPY;  Service: Endoscopy;;  . TONSILLECTOMY    . TOTAL LARYNGECTOMY  01/31/2020    Medications:  Current Outpatient Medications on File Prior to Visit  Medication Sig  . aspirin EC 81 MG tablet Take 1 tablet (81 mg total) by mouth daily. Swallow whole.  . levothyroxine (SYNTHROID) 100 MCG tablet Take 1 tablet (100 mcg total) by mouth daily before breakfast.  . meloxicam (MOBIC) 15 MG tablet TAKE 1 TABLET EVERY DAY  . Multiple Vitamin (MULTIVITAMIN WITH MINERALS) TABS tablet Take 1 tablet by mouth daily. One-A-Day Multivitamin  . omeprazole (PRILOSEC) 20 MG capsule TAKE 1 CAPSULE EVERY DAY BEFORE BREAKFAST  . rosuvastatin (CRESTOR) 5 MG tablet Take 1 tablet (5 mg total) by mouth 2 (two) times a week.  . sucralfate (CARAFATE) 1 g tablet Take 1 g by mouth once.   No current facility-administered medications on file prior to visit.    Allergies:  No Known Allergies  Social History:  Social History   Socioeconomic History  . Marital status: Single    Spouse name: Not on file  . Number of children: 1  . Years of education: [employer]  . Highest education level: High school graduate  Occupational History  . Not on file  Tobacco Use  . Smoking status: Former    Years: 51.00    Types: Cigarettes    Start  date: 75  . Smokeless tobacco: Former  . Tobacco comments:    05/03/19- down to half a pack or less / day  Vaping Use  . Vaping Use: Never used  Substance and Sexual Activity  . Alcohol use: No  . Drug use: No  . Sexual activity: Not Currently  Other Topics Concern  . Not on file  Social History Narrative  . Not on file   Social Determinants of Health   Financial Resource Strain: Low Risk   . Difficulty of Paying Living Expenses: Not hard at all  Food Insecurity: No Food Insecurity  . Worried About Charity fundraiser in the Last Year: Never true  . Ran Out of Food in the Last Year: Never true  Transportation Needs: No Transportation Needs  . Lack of Transportation (Medical): No  . Lack of Transportation (Non-Medical): No  Physical Activity: Insufficiently Active  . Days of Exercise per Week: 4 days  . Minutes of Exercise per Session: 30 min  Stress: No Stress Concern Present  . Feeling of Stress : Not at all  Social Connections: Unknown  . Frequency of Communication with Friends and Family: More than three times a week  . Frequency of Social Gatherings with Friends and Family: More than three times a week  . Attends Religious Services: Never  . Active Member of Clubs or Organizations: No  . Attends Archivist Meetings: Never  . Marital Status: Not on file  Intimate Partner Violence: Not At Risk  . Fear of Current or Ex-Partner: No  . Emotionally Abused: No  . Physically Abused: No  . Sexually Abused: No   Social History   Tobacco Use  Smoking Status Former  . Years: 51.00  . Types: Cigarettes  . Start date: 9  Smokeless Tobacco Former  Tobacco Comments   05/03/19- down to half a pack or less / day   Social History   Substance and Sexual Activity  Alcohol Use No    Family History:  Family History  Problem Relation Age of Onset  . Breast cancer Mother   . Heart disease Mother   . Cancer Mother        breast, back, bladder  . Heart attack  Father   . Heart disease Father     Past medical history, surgical history, medications, allergies, family history and social history reviewed with patient today and changes made to appropriate areas of the chart.   Review of Systems  Constitutional:  Positive for chills. Negative for diaphoresis, fever, malaise/fatigue and weight loss.  HENT:  Positive for tinnitus. Negative for congestion, ear discharge, ear pain, hearing loss, nosebleeds, sinus pain and sore throat.        Needs to have voice box replaced  Eyes:  Positive for blurred vision. Negative for double vision, photophobia, pain, discharge and redness.  Respiratory:  Positive for cough. Negative for hemoptysis, sputum production, shortness of breath, wheezing and stridor.   Cardiovascular: Negative.   Gastrointestinal:  Positive for constipation. Negative for abdominal pain, blood in stool, diarrhea, heartburn, melena, nausea  and vomiting.  Genitourinary: Negative.   Musculoskeletal:  Positive for back pain. Negative for falls, joint pain, myalgias and neck pain.  Skin: Negative.   Neurological:  Positive for dizziness. Negative for tingling, tremors, sensory change, speech change, focal weakness, seizures, loss of consciousness, weakness and headaches.  Endo/Heme/Allergies:  Negative for environmental allergies and polydipsia. Bruises/bleeds easily.  Psychiatric/Behavioral: Negative.    All other ROS negative except what is listed above and in the HPI.      Objective:    BP 131/84   Pulse (!) 58   Temp 98.5 F (36.9 C)   Wt 205 lb 12.8 oz (93.4 kg)   SpO2 96%   BMI 27.16 kg/m   Wt Readings from Last 3 Encounters:  06/01/21 205 lb 12.8 oz (93.4 kg)  12/30/20 213 lb 12.8 oz (97 kg)  12/16/20 205 lb (93 kg)    Physical Exam  Results for orders placed or performed in visit on 12/16/20  GC/Chlamydia Probe Amp   Specimen: Urine   UR  Result Value Ref Range   Chlamydia trachomatis, NAA Negative Negative   Neisseria  Gonorrhoeae by PCR Negative Negative  Microscopic Examination   Urine  Result Value Ref Range   WBC, UA 0-5 0 - 5 /hpf   RBC 0-2 0 - 2 /hpf   Epithelial Cells (non renal) 0-10 0 - 10 /hpf   Casts Present (A) None seen /lpf   Cast Type Hyaline casts N/A   Crystals Present (A) N/A   Crystal Type Calcium Oxalate N/A   Mucus, UA Present (A) Not Estab.   Bacteria, UA Few None seen/Few  CBC with Differential/Platelet  Result Value Ref Range   WBC 10.0 3.4 - 10.8 x10E3/uL   RBC 4.47 4.14 - 5.80 x10E6/uL   Hemoglobin 13.7 13.0 - 17.7 g/dL   Hematocrit 41.3 37.5 - 51.0 %   MCV 92 79 - 97 fL   MCH 30.6 26.6 - 33.0 pg   MCHC 33.2 31.5 - 35.7 g/dL   RDW 12.7 11.6 - 15.4 %   Platelets 293 150 - 450 x10E3/uL   Neutrophils 77 Not Estab. %   Lymphs 16 Not Estab. %   Monocytes 5 Not Estab. %   Eos 2 Not Estab. %   Basos 0 Not Estab. %   Neutrophils Absolute 7.6 (H) 1.4 - 7.0 x10E3/uL   Lymphocytes Absolute 1.6 0.7 - 3.1 x10E3/uL   Monocytes Absolute 0.5 0.1 - 0.9 x10E3/uL   EOS (ABSOLUTE) 0.2 0.0 - 0.4 x10E3/uL   Basophils Absolute 0.0 0.0 - 0.2 x10E3/uL   Immature Granulocytes 0 Not Estab. %   Immature Grans (Abs) 0.0 0.0 - 0.1 x10E3/uL  Comprehensive metabolic panel  Result Value Ref Range   Glucose 142 (H) 70 - 99 mg/dL   BUN 19 8 - 27 mg/dL   Creatinine, Ser 1.30 (H) 0.76 - 1.27 mg/dL   eGFR 60 >59 mL/min/1.73   BUN/Creatinine Ratio 15 10 - 24   Sodium 141 134 - 144 mmol/L   Potassium 4.0 3.5 - 5.2 mmol/L   Chloride 101 96 - 106 mmol/L   CO2 26 20 - 29 mmol/L   Calcium 9.8 8.6 - 10.2 mg/dL   Total Protein 7.0 6.0 - 8.5 g/dL   Albumin 4.8 3.8 - 4.8 g/dL   Globulin, Total 2.2 1.5 - 4.5 g/dL   Albumin/Globulin Ratio 2.2 1.2 - 2.2   Bilirubin Total 0.5 0.0 - 1.2 mg/dL   Alkaline Phosphatase 138 (H) 44 -  121 IU/L   AST 32 0 - 40 IU/L   ALT 26 0 - 44 IU/L  Lipid Panel w/o Chol/HDL Ratio  Result Value Ref Range   Cholesterol, Total 141 100 - 199 mg/dL   Triglycerides 274 (H) 0  - 149 mg/dL   HDL 33 (L) >39 mg/dL   VLDL Cholesterol Cal 44 (H) 5 - 40 mg/dL   LDL Chol Calc (NIH) 64 0 - 99 mg/dL  Microalbumin, Urine Waived  Result Value Ref Range   Microalb, Ur Waived 150 (H) 0 - 19 mg/L   Creatinine, Urine Waived 300 10 - 300 mg/dL   Microalb/Creat Ratio 30-300 (H) <30 mg/g  TSH  Result Value Ref Range   TSH 3.780 0.450 - 4.500 uIU/mL  VITAMIN D 25 Hydroxy (Vit-D Deficiency, Fractures)  Result Value Ref Range   Vit D, 25-Hydroxy 41.1 30.0 - 100.0 ng/mL  Urinalysis, Routine w reflex microscopic  Result Value Ref Range   Specific Gravity, UA >1.030 (H) 1.005 - 1.030   pH, UA 5.5 5.0 - 7.5   Color, UA Orange Yellow   Appearance Ur Cloudy (A) Clear   Leukocytes,UA Negative Negative   Protein,UA 2+ (A) Negative/Trace   Glucose, UA Negative Negative   Ketones, UA 1+ (A) Negative   RBC, UA Negative Negative   Bilirubin, UA Negative Negative   Urobilinogen, Ur 0.2 0.2 - 1.0 mg/dL   Nitrite, UA Negative Negative   Microscopic Examination See below:   B12  Result Value Ref Range   Vitamin B-12 825 232 - 1,245 pg/mL  Lyme Disease Serology w/Reflex  Result Value Ref Range   Lyme Total Antibody EIA Negative Negative  Rocky mtn spotted fvr abs pnl(IgG+IgM)  Result Value Ref Range   RMSF IgG Positive (A) Negative   RMSF IgM 0.62 0.00 - 0.89 index  Babesia microti Antibody Panel  Result Value Ref Range   Babesia microti IgM <1:10 Neg:<1:10   Babesia microti IgG <3:81 OFB:<5:10  Ehrlichia Antibody Panel  Result Value Ref Range   E.Chaffeensis (HME) IgG Negative Neg:<1:64   E. Chaffeensis (HME) IgM Titer Negative Neg:<1:20   HGE IgG Titer Negative Neg:<1:64   HGE IgM Titer Negative Neg:<1:20  HIV Antibody (routine testing w rflx)  Result Value Ref Range   HIV Screen 4th Generation wRfx Non Reactive Non Reactive  RPR  Result Value Ref Range   RPR Ser Ql Non Reactive Non Reactive  HSV(herpes simplex vrs) 1+2 ab-IgG  Result Value Ref Range   HSV 1  Glycoprotein G Ab, IgG 1.15 (H) 0.00 - 0.90 index   HSV 2 IgG, Type Spec <0.91 0.00 - 0.90 index  Acute Viral Hepatitis (HAV, HBV, HCV)  Result Value Ref Range   Hep A IgM Negative Negative   Hepatitis B Surface Ag Negative Negative   Hep B C IgM Negative Negative   HCV Ab <0.1 0.0 - 0.9 s/co ratio  Bayer DCA Hb A1c Waived  Result Value Ref Range   HB A1C (BAYER DCA - WAIVED) 6.2 (H) 4.8 - 5.6 %  Interpretation:  Result Value Ref Range   HCV Interp 1: Comment   RMSF, IgG, IFA  Result Value Ref Range   RMSF, IGG, IFA <1:64 Neg <1:64      Assessment & Plan:   Problem List Items Addressed This Visit       Cardiovascular and Mediastinum   Benign essential hypertension - Primary   Relevant Orders   Comprehensive metabolic panel   CBC  with Differential/Platelet   Microalbumin, Urine Waived   Atherosclerosis of native arteries of extremity with intermittent claudication (HCC)   Relevant Orders   Comprehensive metabolic panel   CBC with Differential/Platelet     Endocrine   Hypothyroidism   Relevant Orders   Comprehensive metabolic panel   CBC with Differential/Platelet   TSH   IFG (impaired fasting glucose)   Relevant Orders   Comprehensive metabolic panel   CBC with Differential/Platelet   Microalbumin, Urine Waived   Bayer DCA Hb A1c Waived     Other   Pure hypercholesterolemia   Relevant Orders   Comprehensive metabolic panel   CBC with Differential/Platelet   Lipid Panel w/o Chol/HDL Ratio   Tobacco abuse   Relevant Orders   Comprehensive metabolic panel   CBC with Differential/Platelet   Urinalysis, Routine w reflex microscopic   Vitamin D deficiency   Relevant Orders   Comprehensive metabolic panel   CBC with Differential/Platelet   VITAMIN D 25 Hydroxy (Vit-D Deficiency, Fractures)   Other Visit Diagnoses     Hesitancy       Relevant Orders   PSA   Senile purpura ([place])           LABORATORY TESTING:  Health maintenance labs ordered today as  discussed above.   The natural history of prostate cancer and ongoing controversy regarding screening and potential treatment outcomes of prostate cancer has been discussed with the patient. The meaning of a false positive PSA and a false negative PSA has been discussed. He indicates understanding of the limitations of this screening test and wishes  to proceed with screening PSA testing.   IMMUNIZATIONS:   - Tdap: Tetanus vaccination status reviewed: last tetanus booster within 10 years. - Influenza: Up to date - Pneumovax: Up to date - Prevnar: Up to date - COVID: Up to date - HPV: Not applicable - Shingrix vaccine: Refused  SCREENING: - Colonoscopy: Up to date  Discussed with patient purpose of the colonoscopy is to detect colon cancer at curable precancerous or early stages   PATIENT COUNSELING:    Sexuality: Discussed sexually transmitted diseases, partner selection, use of condoms, avoidance of unintended pregnancy  and contraceptive alternatives.   Advised to avoid cigarette smoking.  I discussed with the patient that most people either abstain from alcohol or drink within safe limits (<=14/week and <=4 drinks/occasion for males, <=7/weeks and <= 3 drinks/occasion for females) and that the risk for alcohol disorders and other health effects rises proportionally with the number of drinks per week and how often a drinker exceeds daily limits.  Discussed cessation/primary prevention of drug use and availability of treatment for abuse.   Diet: Encouraged to adjust caloric intake to maintain  or achieve ideal body weight, to reduce intake of dietary saturated fat and total fat, to limit sodium intake by avoiding high sodium foods and not adding table salt, and to maintain adequate dietary potassium and calcium preferably from fresh fruits, vegetables, and low-fat dairy products.    stressed the importance of regular exercise  Injury prevention: Discussed safety belts, safety  helmets, smoke detector, smoking near bedding or upholstery.   Dental health: Discussed importance of regular tooth brushing, flossing, and dental visits.   Follow up plan: NEXT PREVENTATIVE PHYSICAL DUE IN 1 YEAR. No follow-ups on file.

## 2021-06-02 LAB — LIPID PANEL W/O CHOL/HDL RATIO
Cholesterol, Total: 202 mg/dL — ABNORMAL HIGH (ref 100–199)
HDL: 33 mg/dL — ABNORMAL LOW (ref >39)
LDL Chol Calc (NIH): 141 mg/dL — ABNORMAL HIGH (ref 0–99)
Triglycerides: 155 mg/dL — ABNORMAL HIGH (ref 0–149)
VLDL Cholesterol Cal: 28 mg/dL (ref 5–40)

## 2021-06-02 LAB — COMPREHENSIVE METABOLIC PANEL
ALT: 14 IU/L (ref 0–44)
AST: 24 IU/L (ref 0–40)
Albumin/Globulin Ratio: 2 (ref 1.2–2.2)
Albumin: 4.4 g/dL (ref 3.8–4.8)
Alkaline Phosphatase: 108 IU/L (ref 44–121)
BUN/Creatinine Ratio: 16 (ref 10–24)
BUN: 18 mg/dL (ref 8–27)
Bilirubin Total: 0.4 mg/dL (ref 0.0–1.2)
CO2: 26 mmol/L (ref 20–29)
Calcium: 9.2 mg/dL (ref 8.6–10.2)
Chloride: 104 mmol/L (ref 96–106)
Creatinine, Ser: 1.11 mg/dL (ref 0.76–1.27)
Globulin, Total: 2.2 g/dL (ref 1.5–4.5)
Glucose: 96 mg/dL (ref 70–99)
Potassium: 4.4 mmol/L (ref 3.5–5.2)
Sodium: 142 mmol/L (ref 134–144)
Total Protein: 6.6 g/dL (ref 6.0–8.5)
eGFR: 72 mL/min/{1.73_m2} (ref >59)

## 2021-06-02 LAB — PSA: Prostate Specific Ag, Serum: 0.4 ng/mL (ref 0.0–4.0)

## 2021-06-02 LAB — CBC WITH DIFFERENTIAL/PLATELET
Basophils Absolute: 0 10*3/uL (ref 0.0–0.2)
Basos: 1 %
EOS (ABSOLUTE): 0.2 10*3/uL (ref 0.0–0.4)
Eos: 4 %
Hematocrit: 40.6 % (ref 37.5–51.0)
Hemoglobin: 13.7 g/dL (ref 13.0–17.7)
Immature Grans (Abs): 0 10*3/uL (ref 0.0–0.1)
Immature Granulocytes: 0 %
Lymphocytes Absolute: 0.9 10*3/uL (ref 0.7–3.1)
Lymphs: 20 %
MCH: 30.6 pg (ref 26.6–33.0)
MCHC: 33.7 g/dL (ref 31.5–35.7)
MCV: 91 fL (ref 79–97)
Monocytes Absolute: 0.3 10*3/uL (ref 0.1–0.9)
Monocytes: 8 %
Neutrophils Absolute: 2.9 10*3/uL (ref 1.4–7.0)
Neutrophils: 67 %
Platelets: 224 10*3/uL (ref 150–450)
RBC: 4.47 x10E6/uL (ref 4.14–5.80)
RDW: 13.1 % (ref 11.6–15.4)
WBC: 4.4 10*3/uL (ref 3.4–10.8)

## 2021-06-02 LAB — TSH: TSH: 2.4 u[IU]/mL (ref 0.450–4.500)

## 2021-06-02 LAB — VITAMIN D 25 HYDROXY (VIT D DEFICIENCY, FRACTURES): Vit D, 25-Hydroxy: 31.5 ng/mL (ref 30.0–100.0)

## 2021-06-04 DIAGNOSIS — D692 Other nonthrombocytopenic purpura: Secondary | ICD-10-CM | POA: Insufficient documentation

## 2021-06-04 NOTE — Assessment & Plan Note (Signed)
Under good control on current regimen. Continue current regimen. Continue to monitor. Call with any concerns. Refills given. Labs drawn today.   

## 2021-06-04 NOTE — Assessment & Plan Note (Signed)
Checking labs today. Await results.  

## 2021-06-04 NOTE — Assessment & Plan Note (Signed)
Continue to follow with oncology. Call with any concerns. Continue to monitor.  

## 2021-06-04 NOTE — Assessment & Plan Note (Signed)
Stable. Continue to monitor. Call with any concerns.  ?

## 2021-06-04 NOTE — Assessment & Plan Note (Signed)
Stable with A1c of 6.2. Continue to monitor. Call with any concerns. Continue to monitor.

## 2021-06-04 NOTE — Assessment & Plan Note (Signed)
Rechecking labs today. Await results. Treat as needed.  °

## 2021-06-04 NOTE — Assessment & Plan Note (Signed)
Stable. Doing well. Continue to monitor.

## 2021-06-17 DIAGNOSIS — R1312 Dysphagia, oropharyngeal phase: Secondary | ICD-10-CM | POA: Diagnosis not present

## 2021-06-17 DIAGNOSIS — Z923 Personal history of irradiation: Secondary | ICD-10-CM | POA: Diagnosis not present

## 2021-06-17 DIAGNOSIS — C329 Malignant neoplasm of larynx, unspecified: Secondary | ICD-10-CM | POA: Diagnosis not present

## 2021-06-26 ENCOUNTER — Other Ambulatory Visit: Payer: Self-pay | Admitting: Family Medicine

## 2021-06-26 DIAGNOSIS — Z8572 Personal history of non-Hodgkin lymphomas: Secondary | ICD-10-CM

## 2021-06-26 NOTE — Telephone Encounter (Signed)
Requested medication (s) are due for refill today: Yes  Requested medication (s) are on the active medication list: Yes  Last refill:  05/20/20  Future visit scheduled: Yes  Notes to clinic:  Prescription has expired.    Requested Prescriptions  Pending Prescriptions Disp Refills   levothyroxine (SYNTHROID) 100 MCG tablet [Pharmacy Med Name: LEVOTHYROXINE 0.'100MG'$  (100MCG) TAB] 90 tablet 3    Sig: TAKE 1 TABLET(100 MCG) BY MOUTH DAILY BEFORE BREAKFAST     Endocrinology:  Hypothyroid Agents Passed - 06/26/2021  2:49 PM      Passed - TSH in normal range and within 360 days    TSH  Date Value Ref Range Status  06/01/2021 2.400 0.450 - 4.500 uIU/mL Final         Passed - Valid encounter within last 12 months    Recent Outpatient Visits           3 weeks ago Routine general medical examination at a health care facility   [facility] [place], [place] P, DO   5 months ago Chronic fatigue   [facility], [place], DO   6 months ago Chronic fatigue   [facility], [place], DO   12 months ago Gastroesophageal reflux disease, unspecified whether esophagitis present   [place], Megan P, DO   1 year ago Laryngeal carcinoma [employer])   [place], [place], DO       Future Appointments             In 3 days [name], Barb Merino, DO [place], [facility]

## 2021-06-29 ENCOUNTER — Ambulatory Visit (INDEPENDENT_AMBULATORY_CARE_PROVIDER_SITE_OTHER): Payer: Medicare HMO | Admitting: Family Medicine

## 2021-06-29 ENCOUNTER — Encounter: Payer: Self-pay | Admitting: Family Medicine

## 2021-06-29 VITALS — BP 118/76 | HR 66 | Temp 97.8°F | Wt 203.0 lb

## 2021-06-29 DIAGNOSIS — K120 Recurrent oral aphthae: Secondary | ICD-10-CM

## 2021-06-29 DIAGNOSIS — G8929 Other chronic pain: Secondary | ICD-10-CM

## 2021-06-29 DIAGNOSIS — M545 Low back pain, unspecified: Secondary | ICD-10-CM

## 2021-06-29 MED ORDER — MAGIC MOUTHWASH: 5.0000 mL | Freq: Three times a day (TID) | ORAL | 3 refills | Status: DC | PRN

## 2021-06-29 MED ORDER — BACLOFEN 10 MG PO TABS: 5.0000 mg | ORAL_TABLET | Freq: Every evening | ORAL | 3 refills | Status: DC | PRN

## 2021-06-29 NOTE — Progress Notes (Signed)
BP 118/76   Pulse 66   Temp 97.8 F (36.6 C)   Wt 203 lb (92.1 kg)   SpO2 97%   BMI 26.79 kg/m    Subjective:    Patient ID: Luke [name], male    DOB: [birth year], 71 y.o.   MRN: [id number]  HPI: Luke [name] is a 71 y.o. male  Chief Complaint  Patient presents with   Back Pain    Patient here for follow up on back pain, states pain is about the same. Patient would not like to do PT. Patient states meloxicam helps but pain comes back.  Patient requesting Handicap placard   mouth pain    Patient states the inside of his mouth has been sore for about three weeks.    Back is doing a little better. He notes that the heat has been helping a lot in the shower. He doesn't want to do PT. He does note that he is not having as much pain, but it does still feel quite tight. No numbness or tingling.   ORAL PAIN Duration: about 3 weeks Involved teeth: upper and lower gums  Dentist evaluation: no Mechanism of injury:  no trauma Onset: sudden Severity: mild to moderate Quality: aching and sore Frequency: constant Radiation: none Aggravating factors: nothing Alleviating factors: nothing Status: worse Treatments attempted: nothing Relief with NSAIDs?: no Fevers: no Swelling: no Redness: no Paresthesias / decreased sensation: no Sinus pressure: no  Relevant past medical, surgical, family and social history reviewed and updated as indicated. Interim medical history since our last visit reviewed. Allergies and medications reviewed and updated.  Review of Systems  Constitutional: Negative.   HENT: Negative.    Respiratory: Negative.    Cardiovascular: Negative.   Gastrointestinal: Negative.   Musculoskeletal:  Positive for back pain and myalgias. Negative for arthralgias, gait problem, joint swelling, neck pain and neck stiffness.  Skin: Negative.   Neurological: Negative.   Psychiatric/Behavioral: Negative.      Per HPI unless specifically indicated above      Objective:    BP 118/76   Pulse 66   Temp 97.8 F (36.6 C)   Wt 203 lb (92.1 kg)   SpO2 97%   BMI 26.79 kg/m   Wt Readings from Last 3 Encounters:  06/29/21 203 lb (92.1 kg)  06/01/21 205 lb 12.8 oz (93.4 kg)  12/30/20 213 lb 12.8 oz (97 kg)    Physical Exam Vitals and nursing note reviewed.  Constitutional:      General: He is not in acute distress.    Appearance: Normal appearance. He is not ill-appearing, toxic-appearing or diaphoretic.  HENT:     Head: Normocephalic and atraumatic.     Right Ear: External ear normal.     Left Ear: External ear normal.     Nose: Nose normal.     Mouth/Throat:     Mouth: Mucous membranes are moist.     Pharynx: Oropharynx is clear.     Comments: No thrush, apthous ulcers along gums near all teeth Eyes:     General: No scleral icterus.       Right eye: No discharge.        Left eye: No discharge.     Extraocular Movements: Extraocular movements intact.     Conjunctiva/sclera: Conjunctivae normal.     Pupils: Pupils are equal, round, and reactive to light.  Cardiovascular:     Rate and Rhythm: Normal rate and regular rhythm.  Pulses: Normal pulses.     Heart sounds: Normal heart sounds. No murmur heard.    No friction rub. No gallop.  Pulmonary:     Effort: Pulmonary effort is normal. No respiratory distress.     Breath sounds: Normal breath sounds. No stridor. No wheezing, rhonchi or rales.  Chest:     Chest wall: No tenderness.  Musculoskeletal:        General: Normal range of motion.  Skin:    General: Skin is warm and dry.     Capillary Refill: Capillary refill takes less than 2 seconds.     Coloration: Skin is not jaundiced or pale.     Findings: No bruising, erythema, lesion or rash.  Neurological:     General: No focal deficit present.     Mental Status: He is alert and oriented to person, place, and time. Mental status is at baseline.  Psychiatric:        Mood and Affect: Mood normal.        Behavior: Behavior  normal.        Thought Content: Thought content normal.        Judgment: Judgment normal.     Results for orders placed or performed in visit on 06/01/21  Microscopic Examination   Urine  Result Value Ref Range   WBC, UA None seen 0 - 5 /hpf   RBC None seen 0 - 2 /hpf   Epithelial Cells (non renal) 0-10 0 - 10 /hpf   Casts Present (A) None seen /lpf   Cast Type Hyaline casts N/A   Mucus, UA Present (A) Not Estab.   Bacteria, UA None seen None seen/Few  Comprehensive metabolic panel  Result Value Ref Range   Glucose 96 70 - 99 mg/dL   BUN 18 8 - 27 mg/dL   Creatinine, Ser 1.11 0.76 - 1.27 mg/dL   eGFR 72 >59 mL/min/1.73   BUN/Creatinine Ratio 16 10 - 24   Sodium 142 134 - 144 mmol/L   Potassium 4.4 3.5 - 5.2 mmol/L   Chloride 104 96 - 106 mmol/L   CO2 26 20 - 29 mmol/L   Calcium 9.2 8.6 - 10.2 mg/dL   Total Protein 6.6 6.0 - 8.5 g/dL   Albumin 4.4 3.8 - 4.8 g/dL   Globulin, Total 2.2 1.5 - 4.5 g/dL   Albumin/Globulin Ratio 2.0 1.2 - 2.2   Bilirubin Total 0.4 0.0 - 1.2 mg/dL   Alkaline Phosphatase 108 44 - 121 IU/L   AST 24 0 - 40 IU/L   ALT 14 0 - 44 IU/L  CBC with Differential/Platelet  Result Value Ref Range   WBC 4.4 3.4 - 10.8 x10E3/uL   RBC 4.47 4.14 - 5.80 x10E6/uL   Hemoglobin 13.7 13.0 - 17.7 g/dL   Hematocrit 40.6 37.5 - 51.0 %   MCV 91 79 - 97 fL   MCH 30.6 26.6 - 33.0 pg   MCHC 33.7 31.5 - 35.7 g/dL   RDW 13.1 11.6 - 15.4 %   Platelets 224 150 - 450 x10E3/uL   Neutrophils 67 Not Estab. %   Lymphs 20 Not Estab. %   Monocytes 8 Not Estab. %   Eos 4 Not Estab. %   Basos 1 Not Estab. %   Neutrophils Absolute 2.9 1.4 - 7.0 x10E3/uL   Lymphocytes Absolute 0.9 0.7 - 3.1 x10E3/uL   Monocytes Absolute 0.3 0.1 - 0.9 x10E3/uL   EOS (ABSOLUTE) 0.2 0.0 - 0.4 x10E3/uL   Basophils Absolute  0.0 0.0 - 0.2 x10E3/uL   Immature Granulocytes 0 Not Estab. %   Immature Grans (Abs) 0.0 0.0 - 0.1 x10E3/uL  Lipid Panel w/o Chol/HDL Ratio  Result Value Ref Range    Cholesterol, Total 202 (H) 100 - 199 mg/dL   Triglycerides 155 (H) 0 - 149 mg/dL   HDL 33 (L) >39 mg/dL   VLDL Cholesterol Cal 28 5 - 40 mg/dL   LDL Chol Calc (NIH) 141 (H) 0 - 99 mg/dL  PSA  Result Value Ref Range   Prostate Specific Ag, Serum 0.4 0.0 - 4.0 ng/mL  TSH  Result Value Ref Range   TSH 2.400 0.450 - 4.500 uIU/mL  Urinalysis, Routine w reflex microscopic  Result Value Ref Range   Specific Gravity, UA >1.030 (H) 1.005 - 1.030   pH, UA 5.5 5.0 - 7.5   Color, UA Yellow Yellow   Appearance Ur Clear Clear   Leukocytes,UA Negative Negative   Protein,UA 1+ (A) Negative/Trace   Glucose, UA Negative Negative   Ketones, UA Trace (A) Negative   RBC, UA Negative Negative   Bilirubin, UA Negative Negative   Urobilinogen, Ur 1.0 0.2 - 1.0 mg/dL   Nitrite, UA Negative Negative   Microscopic Examination See below:   Microalbumin, Urine Waived  Result Value Ref Range   Microalb, Ur Waived 80 (H) 0 - 19 mg/L   Creatinine, Urine Waived 300 10 - 300 mg/dL   Microalb/Creat Ratio 30-300 (H) <30 mg/g  VITAMIN D 25 Hydroxy (Vit-D Deficiency, Fractures)  Result Value Ref Range   Vit D, 25-Hydroxy 31.5 30.0 - 100.0 ng/mL  Bayer DCA Hb A1c Waived  Result Value Ref Range   HB A1C (BAYER DCA - WAIVED) 6.0 (H) 4.8 - 5.6 %      Assessment & Plan:   Problem List Items Addressed This Visit       Other   Chronic bilateral low back pain without sciatica   Relevant Medications   baclofen (LIORESAL) 10 MG tablet   Other Visit Diagnoses     Aphthous ulcer of mouth    -  Primary   Will treat with magic mouth wash. Encouraged patient to follow up with dentristy given severity of apthous ulcers around all his teeth.        Follow up plan: Return in about 5 months (around 11/29/2021).

## 2021-06-30 ENCOUNTER — Telehealth: Payer: Self-pay

## 2021-06-30 NOTE — Telephone Encounter (Signed)
-----   Message from Valerie Roys, DO sent at 06/29/2021 11:12 AM EDT ----- Can we please call in magic mouthwash for him- can't escribe. Recipe is on Rx

## 2021-06-30 NOTE — Telephone Encounter (Signed)
RX called into [facility] for the patient.

## 2021-07-23 DIAGNOSIS — C329 Malignant neoplasm of larynx, unspecified: Secondary | ICD-10-CM | POA: Diagnosis not present

## 2021-07-23 DIAGNOSIS — Z923 Personal history of irradiation: Secondary | ICD-10-CM | POA: Diagnosis not present

## 2021-07-23 DIAGNOSIS — Z8521 Personal history of malignant neoplasm of larynx: Secondary | ICD-10-CM | POA: Diagnosis not present

## 2021-07-23 DIAGNOSIS — Z87891 Personal history of nicotine dependence: Secondary | ICD-10-CM | POA: Diagnosis not present

## 2021-07-23 DIAGNOSIS — R918 Other nonspecific abnormal finding of lung field: Secondary | ICD-10-CM | POA: Diagnosis not present

## 2021-07-23 DIAGNOSIS — C760 Malignant neoplasm of head, face and neck: Secondary | ICD-10-CM | POA: Diagnosis not present

## 2021-07-30 DIAGNOSIS — Z8521 Personal history of malignant neoplasm of larynx: Secondary | ICD-10-CM | POA: Diagnosis not present

## 2021-07-30 DIAGNOSIS — Z9002 Acquired absence of larynx: Secondary | ICD-10-CM | POA: Diagnosis not present

## 2021-07-30 DIAGNOSIS — R491 Aphonia: Secondary | ICD-10-CM | POA: Diagnosis not present

## 2021-08-01 DIAGNOSIS — T85628A Displacement of other specified internal prosthetic devices, implants and grafts, initial encounter: Secondary | ICD-10-CM | POA: Diagnosis not present

## 2021-08-01 DIAGNOSIS — Z87891 Personal history of nicotine dependence: Secondary | ICD-10-CM | POA: Diagnosis not present

## 2021-08-01 DIAGNOSIS — Z930 Tracheostomy status: Secondary | ICD-10-CM | POA: Diagnosis not present

## 2021-08-01 DIAGNOSIS — J9503 Malfunction of tracheostomy stoma: Secondary | ICD-10-CM | POA: Diagnosis not present

## 2021-08-02 DIAGNOSIS — Z930 Tracheostomy status: Secondary | ICD-10-CM | POA: Diagnosis not present

## 2021-08-02 DIAGNOSIS — J9503 Malfunction of tracheostomy stoma: Secondary | ICD-10-CM | POA: Diagnosis not present

## 2021-08-04 ENCOUNTER — Telehealth: Payer: Self-pay | Admitting: *Deleted

## 2021-08-04 DIAGNOSIS — Z923 Personal history of irradiation: Secondary | ICD-10-CM | POA: Diagnosis not present

## 2021-08-04 DIAGNOSIS — Z8521 Personal history of malignant neoplasm of larynx: Secondary | ICD-10-CM | POA: Diagnosis not present

## 2021-08-04 DIAGNOSIS — Z8572 Personal history of non-Hodgkin lymphomas: Secondary | ICD-10-CM | POA: Diagnosis not present

## 2021-08-04 DIAGNOSIS — Z9002 Acquired absence of larynx: Secondary | ICD-10-CM | POA: Diagnosis not present

## 2021-08-04 DIAGNOSIS — R491 Aphonia: Secondary | ICD-10-CM | POA: Diagnosis not present

## 2021-08-04 NOTE — Telephone Encounter (Signed)
Transition Care Management Unsuccessful Follow-up Telephone Call  Date of discharge and from where:  [facility] 08-02-2021  Attempts:  1st Attempt  Reason for unsuccessful TCM follow-up call:  Left voice message

## 2021-08-05 NOTE — Telephone Encounter (Signed)
Transition Care Management Unsuccessful Follow-up Telephone Call  Date of discharge and from where:  [facility] 7-23  Attempts:  2nd Attempt  Reason for unsuccessful TCM follow-up call:  Left voice message

## 2021-09-08 ENCOUNTER — Other Ambulatory Visit: Payer: Medicare HMO

## 2021-09-08 DIAGNOSIS — J029 Acute pharyngitis, unspecified: Secondary | ICD-10-CM | POA: Diagnosis not present

## 2021-09-08 DIAGNOSIS — Z9002 Acquired absence of larynx: Secondary | ICD-10-CM | POA: Diagnosis not present

## 2021-09-08 DIAGNOSIS — R491 Aphonia: Secondary | ICD-10-CM | POA: Diagnosis not present

## 2021-09-08 DIAGNOSIS — Z8521 Personal history of malignant neoplasm of larynx: Secondary | ICD-10-CM | POA: Diagnosis not present

## 2021-09-08 DIAGNOSIS — Z923 Personal history of irradiation: Secondary | ICD-10-CM | POA: Diagnosis not present

## 2021-09-16 DIAGNOSIS — C329 Malignant neoplasm of larynx, unspecified: Secondary | ICD-10-CM | POA: Diagnosis not present

## 2021-09-16 DIAGNOSIS — R1312 Dysphagia, oropharyngeal phase: Secondary | ICD-10-CM | POA: Diagnosis not present

## 2021-09-16 DIAGNOSIS — Z923 Personal history of irradiation: Secondary | ICD-10-CM | POA: Diagnosis not present

## 2021-09-16 DIAGNOSIS — Z9002 Acquired absence of larynx: Secondary | ICD-10-CM | POA: Diagnosis not present

## 2021-10-26 DIAGNOSIS — R1312 Dysphagia, oropharyngeal phase: Secondary | ICD-10-CM | POA: Diagnosis not present

## 2021-10-26 DIAGNOSIS — Z9002 Acquired absence of larynx: Secondary | ICD-10-CM | POA: Diagnosis not present

## 2021-10-26 DIAGNOSIS — C329 Malignant neoplasm of larynx, unspecified: Secondary | ICD-10-CM | POA: Diagnosis not present

## 2021-10-26 DIAGNOSIS — Z923 Personal history of irradiation: Secondary | ICD-10-CM | POA: Diagnosis not present

## 2021-10-26 DIAGNOSIS — R491 Aphonia: Secondary | ICD-10-CM | POA: Diagnosis not present

## 2021-11-02 DIAGNOSIS — R491 Aphonia: Secondary | ICD-10-CM | POA: Diagnosis not present

## 2021-11-02 DIAGNOSIS — Z08 Encounter for follow-up examination after completed treatment for malignant neoplasm: Secondary | ICD-10-CM | POA: Diagnosis not present

## 2021-11-02 DIAGNOSIS — Z87891 Personal history of nicotine dependence: Secondary | ICD-10-CM | POA: Diagnosis not present

## 2021-11-02 DIAGNOSIS — Z9002 Acquired absence of larynx: Secondary | ICD-10-CM | POA: Diagnosis not present

## 2021-11-02 DIAGNOSIS — Z8521 Personal history of malignant neoplasm of larynx: Secondary | ICD-10-CM | POA: Diagnosis not present

## 2021-11-02 DIAGNOSIS — R1310 Dysphagia, unspecified: Secondary | ICD-10-CM | POA: Diagnosis not present

## 2021-11-02 DIAGNOSIS — Z9221 Personal history of antineoplastic chemotherapy: Secondary | ICD-10-CM | POA: Diagnosis not present

## 2021-11-02 DIAGNOSIS — Z923 Personal history of irradiation: Secondary | ICD-10-CM | POA: Diagnosis not present

## 2021-11-05 ENCOUNTER — Telehealth: Payer: Self-pay | Admitting: Family Medicine

## 2021-11-05 NOTE — Telephone Encounter (Signed)
Caller needing updated order w/ pts current oxygen liter flow  Pt is switching from [facility] to [facility]  Fax [phone]

## 2021-11-09 ENCOUNTER — Encounter (INDEPENDENT_AMBULATORY_CARE_PROVIDER_SITE_OTHER): Payer: Self-pay

## 2021-11-09 NOTE — Telephone Encounter (Signed)
Please advise, should pulmonologist write updated order?

## 2021-11-09 NOTE — Telephone Encounter (Signed)
Yes please

## 2021-11-11 NOTE — Telephone Encounter (Signed)
Returned call to [facility], left message advising to reach out to pulmonology for updated Oxygen order.

## 2021-12-01 ENCOUNTER — Ambulatory Visit: Payer: Medicare HMO | Admitting: Family Medicine

## 2021-12-09 ENCOUNTER — Ambulatory Visit: Payer: Medicare HMO | Admitting: Family Medicine

## 2021-12-11 DIAGNOSIS — Z9002 Acquired absence of larynx: Secondary | ICD-10-CM | POA: Diagnosis not present

## 2021-12-11 DIAGNOSIS — R491 Aphonia: Secondary | ICD-10-CM | POA: Diagnosis not present

## 2021-12-11 DIAGNOSIS — R1310 Dysphagia, unspecified: Secondary | ICD-10-CM | POA: Diagnosis not present

## 2021-12-11 DIAGNOSIS — Z923 Personal history of irradiation: Secondary | ICD-10-CM | POA: Diagnosis not present

## 2021-12-16 DIAGNOSIS — Z923 Personal history of irradiation: Secondary | ICD-10-CM | POA: Diagnosis not present

## 2021-12-16 DIAGNOSIS — Z9002 Acquired absence of larynx: Secondary | ICD-10-CM | POA: Diagnosis not present

## 2021-12-16 DIAGNOSIS — R1312 Dysphagia, oropharyngeal phase: Secondary | ICD-10-CM | POA: Diagnosis not present

## 2021-12-16 DIAGNOSIS — R491 Aphonia: Secondary | ICD-10-CM | POA: Diagnosis not present

## 2021-12-16 DIAGNOSIS — Z8521 Personal history of malignant neoplasm of larynx: Secondary | ICD-10-CM | POA: Diagnosis not present

## 2022-01-08 ENCOUNTER — Ambulatory Visit (INDEPENDENT_AMBULATORY_CARE_PROVIDER_SITE_OTHER): Payer: Medicare HMO | Admitting: Family Medicine

## 2022-01-08 ENCOUNTER — Encounter: Payer: Self-pay | Admitting: Family Medicine

## 2022-01-08 VITALS — BP 135/81 | HR 63 | Temp 98.5°F | Wt 207.0 lb

## 2022-01-08 DIAGNOSIS — J432 Centrilobular emphysema: Secondary | ICD-10-CM

## 2022-01-08 DIAGNOSIS — I70213 Atherosclerosis of native arteries of extremities with intermittent claudication, bilateral legs: Secondary | ICD-10-CM

## 2022-01-08 DIAGNOSIS — D692 Other nonthrombocytopenic purpura: Secondary | ICD-10-CM | POA: Diagnosis not present

## 2022-01-08 DIAGNOSIS — K219 Gastro-esophageal reflux disease without esophagitis: Secondary | ICD-10-CM

## 2022-01-08 DIAGNOSIS — Z23 Encounter for immunization: Secondary | ICD-10-CM | POA: Diagnosis not present

## 2022-01-08 DIAGNOSIS — M5442 Lumbago with sciatica, left side: Secondary | ICD-10-CM

## 2022-01-08 DIAGNOSIS — E039 Hypothyroidism, unspecified: Secondary | ICD-10-CM

## 2022-01-08 DIAGNOSIS — E559 Vitamin D deficiency, unspecified: Secondary | ICD-10-CM | POA: Diagnosis not present

## 2022-01-08 DIAGNOSIS — Z0000 Encounter for general adult medical examination without abnormal findings: Secondary | ICD-10-CM | POA: Diagnosis not present

## 2022-01-08 DIAGNOSIS — S61412A Laceration without foreign body of left hand, initial encounter: Secondary | ICD-10-CM

## 2022-01-08 DIAGNOSIS — I10 Essential (primary) hypertension: Secondary | ICD-10-CM | POA: Diagnosis not present

## 2022-01-08 DIAGNOSIS — R7301 Impaired fasting glucose: Secondary | ICD-10-CM | POA: Diagnosis not present

## 2022-01-08 LAB — BAYER DCA HB A1C WAIVED: HB A1C (BAYER DCA - WAIVED): 6.3 % — ABNORMAL HIGH (ref 4.8–5.6)

## 2022-01-08 MED ORDER — TRAMADOL HCL 50 MG PO TABS: 50.0000 mg | ORAL_TABLET | Freq: Three times a day (TID) | ORAL | 0 refills | Status: AC | PRN

## 2022-01-08 MED ORDER — KETOROLAC TROMETHAMINE 60 MG/2ML IM SOLN
60.0000 mg | Freq: Once | INTRAMUSCULAR | Status: AC
  Administered 2022-01-08: 60 mg via INTRAMUSCULAR

## 2022-01-08 MED ORDER — ROSUVASTATIN CALCIUM 5 MG PO TABS: 5.0000 mg | ORAL_TABLET | ORAL | 0 refills | Status: DC

## 2022-01-08 MED ORDER — OMEPRAZOLE 20 MG PO CPDR: 20.0000 mg | DELAYED_RELEASE_CAPSULE | Freq: Every day | ORAL | 2 refills | Status: DC

## 2022-01-08 MED ORDER — BACLOFEN 10 MG PO TABS: 5.0000 mg | ORAL_TABLET | Freq: Every evening | ORAL | 3 refills | Status: DC | PRN

## 2022-01-08 MED ORDER — GABAPENTIN 100 MG PO CAPS: 100.0000 mg | ORAL_CAPSULE | Freq: Every day | ORAL | 3 refills | Status: DC

## 2022-01-08 NOTE — Assessment & Plan Note (Signed)
Doing well off medicine. Continue to monitor. Call with any concerns.  

## 2022-01-08 NOTE — Assessment & Plan Note (Signed)
Will keep BP and cholesterol under good control. Continue to monitor. Call with any concerns.  

## 2022-01-08 NOTE — Patient Instructions (Signed)
Preventative Services:  Health Risk Assessment and Personalized Prevention Plan: Done today Bone Mass Measurements: N/A CVD Screening: done today Colon Cancer Screening: Up to date Depression Screening: Done today Diabetes Screening: Done today Glaucoma Screening: see your eye doctor Hepatitis B vaccine: N/A Hepatitis C screening: Up to date HIV Screening: Up to date Flu Vaccine: Done today Lung cancer Screening: up to date Obesity Screening:  done today Pneumonia Vaccines (2): up to date STI Screening: N/A PSA screening: Done today.

## 2022-01-08 NOTE — Progress Notes (Signed)
BP 135/81   Pulse 63   Temp 98.5 F (36.9 C) (Oral)   Wt 207 lb (93.9 kg)   SpO2 93%   BMI 27.32 kg/m    Subjective:    Patient ID: Luke [name], male    DOB: [birth year], 71 y.o.   MRN: [id number]  HPI: Luke [name] is a 71 y.o. male presenting on 01/08/2022 for comprehensive medical examination. Current medical complaints include:  BACK PAIN Duration: about a week Mechanism of injury: unknown Location: low back Onset: sudden Severity: severe Quality: shooting, aching, burning Frequency: intermittent Radiation: L leg below the knee Aggravating factors: at night Alleviating factors: rest, ice, heat, laying, NSAIDs, and muscle relaxer Status: worse Treatments attempted:  meloxicam, muscle relaxer, rest, ice, heat, and APAP  Relief with NSAIDs?: no Nighttime pain:  yes Paresthesias / decreased sensation:  yes Bowel / bladder incontinence:  no Fevers:  no Dysuria / urinary frequency:  no  HYPERTENSION / HYPERLIPIDEMIA Satisfied with current treatment? yes Duration of hypertension: chronic BP monitoring frequency: not checking BP medication side effects: no Past BP meds: none Duration of hyperlipidemia: chronic Cholesterol medication side effects: no Cholesterol supplements: none Past cholesterol medications: crestor Medication compliance: excellent compliance Aspirin: yes Recent stressors: no Recurrent headaches: no Visual changes: no Palpitations: no Dyspnea: no Chest pain: no Lower extremity edema: no Dizzy/lightheaded: no  Impaired Fasting Glucose HbA1C:  Lab Results  Component Value Date   HGBA1C 6.3 (H) 01/08/2022   Duration of elevated blood sugar: chronic Polydipsia: no Polyuria: no Weight change: no Visual disturbance: no Glucose Monitoring: no    Accucheck frequency: Not Checking Diabetic Education: Completed Family history of diabetes: yes  HYPOTHYROIDISM Thyroid control status:controlled Satisfied with current treatment?  yes Medication side effects: no Medication compliance: excellent compliance Recent dose adjustment:no Fatigue: no Cold intolerance: no Heat intolerance: no Weight gain: no Weight loss: no Constipation: no Diarrhea/loose stools: no Palpitations: no Lower extremity edema: no Anxiety/depressed mood: no  Interim Problems from his last visit: no  Functional Status Survey: Is the patient deaf or have difficulty hearing?: No Does the patient have difficulty seeing, even when wearing glasses/contacts?: No Does the patient have difficulty concentrating, remembering, or making decisions?: No Does the patient have difficulty walking or climbing stairs?: No Does the patient have difficulty dressing or bathing?: No Does the patient have difficulty doing errands alone such as visiting a doctor's office or shopping?: No  FALL RISK:    01/08/2022    3:36 PM 06/29/2021   10:20 AM 12/30/2020   10:47 AM 12/15/2020   12:52 PM 12/25/2019    9:56 AM  Fall Risk   Falls in the past year? 0 0 0 0 0  Number falls in past yr: 0 0 0 0   Injury with Fall? 0 0 0 0   Risk for fall due to : No Fall Risks No Fall Risks No Fall Risks    Follow up Falls evaluation completed Falls evaluation completed Falls evaluation completed Falls evaluation completed;Falls prevention discussed     Depression Screen    01/08/2022    3:36 PM 06/29/2021   10:19 AM 06/01/2021   10:17 AM 12/30/2020   11:04 AM 12/15/2020   12:56 PM  Depression screen PHQ 2/9  Decreased Interest 0 0 0 0 0  Down, Depressed, Hopeless 0 0 0 0 0  PHQ - 2 Score 0 0 0 0 0  Altered sleeping 0 0 0 0   Tired, decreased energy 0  0 0    Change in appetite 0 0 0 0   Feeling bad or failure about yourself  0 0 0 0   Trouble concentrating 0 0 0 0   Moving slowly or fidgety/restless 0 0 0 0   Suicidal thoughts 0 0 0 0   PHQ-9 Score 0 0 0 0   Difficult doing work/chores Not difficult at all Not difficult at all Not difficult at all      Advanced  Directives <no information>  Past Medical History:  Past Medical History:  Diagnosis Date   Anxiety    Arthritis    Carcinoma metastatic to lymph node (HCC)    Cataract    right   COPD (chronic obstructive pulmonary disease) (HCC)    Depression    GERD (gastroesophageal reflux disease)    Hoarseness of voice    Personal history of tobacco use, presenting hazards to health February of 2001   Thyroid disease     Surgical History:  Past Surgical History:  Procedure Laterality Date   BALLOON DILATION N/A 01/30/2018   Procedure: BALLOON DILATION;  Surgeon: Mauri Pole, MD;  Location: [facility] ENDOSCOPY;  Service: Endoscopy;  Laterality: N/A;   COLONOSCOPY     COLONOSCOPY WITH PROPOFOL N/A 11/29/2016   Procedure: COLONOSCOPY WITH PROPOFOL;  Surgeon: Manya Silvas, MD;  Location: [facility] ENDOSCOPY;  Service: Endoscopy;  Laterality: N/A;   COLONOSCOPY WITH PROPOFOL N/A 08/22/2017   Procedure: COLONOSCOPY WITH PROPOFOL;  Surgeon: Jonathon Bellows, MD;  Location: [facility] ENDOSCOPY;  Service: Gastroenterology;  Laterality: N/A;   ESOPHAGEAL MANOMETRY N/A 10/05/2017   Procedure: ESOPHAGEAL MANOMETRY (EM);  Surgeon: Jonathon Bellows, MD;  Location: [facility] ENDOSCOPY;  Service: Gastroenterology;  Laterality: N/A;   ESOPHAGEAL MANOMETRY N/A 01/30/2018   Procedure: ESOPHAGEAL MANOMETRY (EM);  Surgeon: Mauri Pole, MD;  Location: [facility] ENDOSCOPY;  Service: Endoscopy;  Laterality: N/A;   ESOPHAGOGASTRODUODENOSCOPY N/A 03/09/2018   Procedure: ESOPHAGOGASTRODUODENOSCOPY (EGD);  Surgeon: Milus Banister, MD;  Location: [facility] ENDOSCOPY;  Service: Endoscopy;  Laterality: N/A;   ESOPHAGOGASTRODUODENOSCOPY (EGD) WITH PROPOFOL N/A 11/29/2016   Procedure: ESOPHAGOGASTRODUODENOSCOPY (EGD) WITH PROPOFOL;  Surgeon: Manya Silvas, MD;  Location: [facility] ENDOSCOPY;  Service: Endoscopy;  Laterality: N/A;   ESOPHAGOGASTRODUODENOSCOPY (EGD) WITH PROPOFOL N/A 08/22/2017   Procedure: ESOPHAGOGASTRODUODENOSCOPY (EGD) WITH  PROPOFOL;  Surgeon: Jonathon Bellows, MD;  Location[facility] ENDOSCOPY;  Service: Gastroenterology;  Laterality: N/A;   ESOPHAGOGASTRODUODENOSCOPY (EGD) WITH PROPOFOL N/A 01/30/2018   Procedure: ESOPHAGOGASTRODUODENOSCOPY (EGD) WITH PROPOFOL;  Surgeon: Mauri Pole, MD;  Location: [facility] ENDOSCOPY;  Service: Endoscopy;  Laterality: N/A;  Mano probe to be placed during EGD   EUS N/A 03/09/2018   Procedure: UPPER ENDOSCOPIC ULTRASOUND (EUS) RADIAL;  Surgeon: Milus Banister, MD;  Location: [facility] ENDOSCOPY;  Service: Endoscopy;  Laterality: N/A;   NECK SURGERY     status post diagnosis of cancer with chemo and radiation   SUBMUCOSAL INJECTION  03/09/2018   Procedure: SUBMUCOSAL INJECTION;  Surgeon: Milus Banister, MD;  Location: [facility] ENDOSCOPY;  Service: Endoscopy;;   TONSILLECTOMY     TOTAL LARYNGECTOMY  01/31/2020    Medications:  Current Outpatient Medications on File Prior to Visit  Medication Sig   aspirin EC 81 MG tablet Take 1 tablet (81 mg total) by mouth daily. Swallow whole.   diclofenac Sodium (VOLTAREN) 1 % GEL Apply 4 g topically 4 (four) times daily.   levothyroxine (SYNTHROID) 100 MCG tablet TAKE 1 TABLET(100 MCG) BY MOUTH DAILY BEFORE BREAKFAST   magic  mouthwash SOLN Take 5 mLs by mouth 3 (three) times daily as needed for mouth pain.   meloxicam (MOBIC) 15 MG tablet TAKE 1 TABLET EVERY DAY   Multiple Vitamin (MULTIVITAMIN WITH MINERALS) TABS tablet Take 1 tablet by mouth daily. One-A-Day Multivitamin   sucralfate (CARAFATE) 1 g tablet Take 1 g by mouth once.   No current facility-administered medications on file prior to visit.    Allergies:  No Known Allergies  Social History:  Social History   Socioeconomic History   Marital status: Single    Spouse name: Not on file   Number of children: 1   Years of education: High School   Highest education level: High school graduate  Occupational History   Not on file  Tobacco Use   Smoking status: Former    Years: 51.00     Types: Cigarettes    Start date: 1970   Smokeless tobacco: Former   Tobacco comments:    05/03/19- down to half a pack or less / day  Vaping Use   Vaping Use: Never used  Substance and Sexual Activity   Alcohol use: No   Drug use: No   Sexual activity: Not Currently  Other Topics Concern   Not on file  Social History Narrative   Not on file   Social Determinants of Health   Financial Resource Strain: Low Risk  (12/15/2020)   Overall Financial Resource Strain (CARDIA)    Difficulty of Paying Living Expenses: Not hard at all  Food Insecurity: No Food Insecurity (12/15/2020)   Hunger Vital Sign    Worried About Running Out of Food in the Last Year: Never true    [place] in the Last Year: Never true  Transportation Needs: No Transportation Needs (12/15/2020)   PRAPARE - Hydrologist (Medical): No    Lack of Transportation (Non-Medical): No  Physical Activity: Insufficiently Active (12/15/2020)   Exercise Vital Sign    Days of Exercise per Week: 4 days    Minutes of Exercise per Session: 30 min  Stress: No Stress Concern Present (12/15/2020)   [place]    Feeling of Stress : Not at all  Social Connections: Unknown (12/15/2020)   Social Connection and Isolation Panel [NHANES]    Frequency of Communication with Friends and Family: More than three times a week    Frequency of Social Gatherings with Friends and Family: More than three times a week    Attends Religious Services: Never    Marine scientist or Organizations: No    Attends Archivist Meetings: Never    Marital Status: Not on file  Intimate Partner Violence: Not At Risk (12/15/2020)   Humiliation, Afraid, Rape, and Kick questionnaire    Fear of Current or Ex-Partner: No    Emotionally Abused: No    Physically Abused: No    Sexually Abused: No   Social History   Tobacco Use  Smoking Status Former    Years: 51.00   Types: Cigarettes   Start date: 1970  Smokeless Tobacco Former  Tobacco Comments   05/03/19- down to half a pack or less / day   Social History   Substance and Sexual Activity  Alcohol Use No    Family History:  Family History  Problem Relation Age of Onset   Breast cancer Mother    Heart disease Mother    Cancer Mother  breast, back, bladder   Heart attack Father    Heart disease Father     Past medical history, surgical history, medications, allergies, family history and social history reviewed with patient today and changes made to appropriate areas of the chart.   Review of Systems  Constitutional: Negative.   HENT: Negative.    Eyes: Negative.   Respiratory: Negative.    Cardiovascular: Negative.   Gastrointestinal: Negative.   Genitourinary: Negative.   Musculoskeletal:  Positive for back pain and myalgias. Negative for falls, joint pain and neck pain.  Skin: Negative.   Neurological: Negative.   Endo/Heme/Allergies: Negative.   Psychiatric/Behavioral: Negative.     All other ROS negative except what is listed above and in the HPI.      Objective:    BP 135/81   Pulse 63   Temp 98.5 F (36.9 C) (Oral)   Wt 207 lb (93.9 kg)   SpO2 93%   BMI 27.32 kg/m   Wt Readings from Last 3 Encounters:  01/08/22 207 lb (93.9 kg)  06/29/21 203 lb (92.1 kg)  06/01/21 205 lb 12.8 oz (93.4 kg)    No results found.  Physical Exam Vitals and nursing note reviewed.  Constitutional:      General: He is not in acute distress.    Appearance: Normal appearance. He is normal weight. He is not ill-appearing, toxic-appearing or diaphoretic.  HENT:     Head: Normocephalic and atraumatic.     Comments: Trach in place    Right Ear: Tympanic membrane, ear canal and external ear normal. There is no impacted cerumen.     Left Ear: Tympanic membrane, ear canal and external ear normal. There is no impacted cerumen.     Nose: Nose normal. No congestion or  rhinorrhea.     Mouth/Throat:     Mouth: Mucous membranes are moist.     Pharynx: Oropharynx is clear. No oropharyngeal exudate or posterior oropharyngeal erythema.  Eyes:     General: No scleral icterus.       Right eye: No discharge.        Left eye: No discharge.     Extraocular Movements: Extraocular movements intact.     Conjunctiva/sclera: Conjunctivae normal.     Pupils: Pupils are equal, round, and reactive to light.  Neck:     Vascular: No carotid bruit.  Cardiovascular:     Rate and Rhythm: Normal rate and regular rhythm.     Pulses: Normal pulses.     Heart sounds: No murmur heard.    No friction rub. No gallop.  Pulmonary:     Effort: Pulmonary effort is normal. No respiratory distress.     Breath sounds: Normal breath sounds. No stridor. No wheezing, rhonchi or rales.  Chest:     Chest wall: No tenderness.  Abdominal:     General: Abdomen is flat. Bowel sounds are normal. There is no distension.     Palpations: Abdomen is soft. There is no mass.     Tenderness: There is no abdominal tenderness. There is no right CVA tenderness, left CVA tenderness, guarding or rebound.     Hernia: No hernia is present.  Genitourinary:    Comments: Genital exam deferred with shared decision making Musculoskeletal:        General: Deformity (of head and neck) present. No swelling, tenderness or signs of injury.     Cervical back: Normal range of motion and neck supple. No rigidity. No muscular tenderness.  Right lower leg: No edema.     Left lower leg: No edema.  Lymphadenopathy:     Cervical: No cervical adenopathy.  Skin:    General: Skin is warm and dry.     Capillary Refill: Capillary refill takes less than 2 seconds.     Coloration: Skin is not jaundiced or pale.     Findings: No bruising, erythema, lesion or rash.  Neurological:     General: No focal deficit present.     Mental Status: He is alert and oriented to person, place, and time.     Cranial Nerves: No cranial  nerve deficit.     Sensory: No sensory deficit.     Motor: No weakness.     Coordination: Coordination normal.     Gait: Gait normal.     Deep Tendon Reflexes: Reflexes normal.  Psychiatric:        Mood and Affect: Mood normal.        Behavior: Behavior normal.        Thought Content: Thought content normal.        Judgment: Judgment normal.        01/08/2022    3:53 PM 12/15/2020   12:55 PM 12/14/2019   11:25 AM  6CIT Screen  What Year? 0 points 0 points 0 points  What month? 0 points 0 points 0 points  What time? 0 points 0 points 0 points  Count back from 20 0 points 0 points 2 points  Months in reverse 4 points 0 points 4 points  Repeat phrase 6 points 0 points 4 points  Total Score 10 points 0 points 10 points    Results for orders placed or performed in visit on 01/08/22  Bayer DCA Hb A1c Waived  Result Value Ref Range   HB A1C (BAYER DCA - WAIVED) 6.3 (H) 4.8 - 5.6 %      Assessment & Plan:   Problem List Items Addressed This Visit       Cardiovascular and Mediastinum   Benign essential hypertension    Doing well off medicine. Continue to monitor. Call with any concerns.       Relevant Medications   rosuvastatin (CRESTOR) 5 MG tablet (Start on 01/11/2022)   Other Relevant Orders   CBC with Differential/Platelet   Comprehensive metabolic panel   Atherosclerosis of native arteries of extremity with intermittent claudication ([place])    Will keep BP and cholesterol under good control. Continue to monitor. Call with any concerns.      Relevant Medications   gabapentin (NEURONTIN) 100 MG capsule   traMADol (ULTRAM) 50 MG tablet   baclofen (LIORESAL) 10 MG tablet   rosuvastatin (CRESTOR) 5 MG tablet (Start on 01/11/2022)   Other Relevant Orders   CBC with Differential/Platelet   Comprehensive metabolic panel   Lipid Panel w/o Chol/HDL Ratio   Senile purpura ([place])    Reassured patient. Continue to monitor.       Relevant Medications   rosuvastatin (CRESTOR)  5 MG tablet (Start on 01/11/2022)   Other Relevant Orders   CBC with Differential/Platelet   Comprehensive metabolic panel     Respiratory   Centrilobular emphysema ([place])    Under good control on current regimen. Continue current regimen. Continue to monitor. Call with any concerns. Refills given.        Relevant Orders   CBC with Differential/Platelet   Comprehensive metabolic panel     Digestive   Gastroesophageal reflux disease   Relevant Medications  omeprazole (PRILOSEC) 20 MG capsule     Endocrine   Hypothyroidism   Relevant Orders   CBC with Differential/Platelet   Comprehensive metabolic panel   TSH   IFG (impaired fasting glucose)   Relevant Orders   CBC with Differential/Platelet   Bayer DCA Hb A1c Waived (Completed)   Comprehensive metabolic panel     Other   Vitamin D deficiency    Rechecking labs today. Await results. Treat as needed.       Relevant Orders   CBC with Differential/Platelet   Comprehensive metabolic panel   VITAMIN D 25 Hydroxy (Vit-D Deficiency, Fractures)   Other Visit Diagnoses     Encounter for Medicare annual wellness exam    -  Primary   Preventative care discussed today as below.   Acute left-sided low back pain with left-sided sciatica       Will treat with tramadol, toradol, gabapentin and baclofen. Referral to PT placed today. Await results.   Relevant Medications   ketorolac (TORADOL) injection 60 mg (Completed)   gabapentin (NEURONTIN) 100 MG capsule   traMADol (ULTRAM) 50 MG tablet   baclofen (LIORESAL) 10 MG tablet   Other Relevant Orders   Ambulatory referral to Physical Therapy   Laceration of left hand, foreign body presence unspecified, initial encounter       Healing well. Td given today.   Relevant Orders   Td : Tetanus/diphtheria >7yo Preservative  free (Completed)   Needs flu shot       Flu shot given today.   Relevant Orders   Flu Vaccine QUAD High Dose(Fluad) (Completed)        Preventative Services:   Health Risk Assessment and Personalized Prevention Plan: Done today Bone Mass Measurements: N/A CVD Screening: done today Colon Cancer Screening: Up to date Depression Screening: Done today Diabetes Screening: Done today Glaucoma Screening: see your eye doctor Hepatitis B vaccine: N/A Hepatitis C screening: Up to date HIV Screening: Up to date Flu Vaccine: Done today Lung cancer Screening: up to date Obesity Screening:  done today Pneumonia Vaccines (2): up to date STI Screening: N/A PSA screening: Done today.  Discussed aspirin prophylaxis for myocardial infarction prevention and decision was made to continue ASA  LABORATORY TESTING:  Health maintenance labs ordered today as discussed above.   The natural history of prostate cancer and ongoing controversy regarding screening and potential treatment outcomes of prostate cancer has been discussed with the patient. The meaning of a false positive PSA and a false negative PSA has been discussed. He indicates understanding of the limitations of this screening test and wishes to proceed with screening PSA testing.   IMMUNIZATIONS:   - Tdap: Tetanus vaccination status reviewed: Td vaccination indicated and given today. - Influenza: Administered today - Pneumovax: Up to date - Prevnar: Up to date - Zostavax vaccine: Up to date  SCREENING: - Colonoscopy: Up to date  Discussed with patient purpose of the colonoscopy is to detect colon cancer at curable precancerous or early stages   PATIENT COUNSELING:    Sexuality: Discussed sexually transmitted diseases, partner selection, use of condoms, avoidance of unintended pregnancy  and contraceptive alternatives.   Advised to avoid cigarette smoking.  I discussed with the patient that most people either abstain from alcohol or drink within safe limits (<=14/week and <=4 drinks/occasion for males, <=7/weeks and <= 3 drinks/occasion for females) and that the risk for alcohol disorders and  other health effects rises proportionally with the number of drinks per week and  how often a drinker exceeds daily limits.  Discussed cessation/primary prevention of drug use and availability of treatment for abuse.   Diet: Encouraged to adjust caloric intake to maintain  or achieve ideal body weight, to reduce intake of dietary saturated fat and total fat, to limit sodium intake by avoiding high sodium foods and not adding table salt, and to maintain adequate dietary potassium and calcium preferably from fresh fruits, vegetables, and low-fat dairy products.    stressed the importance of regular exercise  Injury prevention: Discussed safety belts, safety helmets, smoke detector, smoking near bedding or upholstery.   Dental health: Discussed importance of regular tooth brushing, flossing, and dental visits.   Follow up plan: NEXT PREVENTATIVE PHYSICAL DUE IN 1 YEAR. Return in about 6 months (around 07/10/2022).

## 2022-01-08 NOTE — Assessment & Plan Note (Signed)
Under good control on current regimen. Continue current regimen. Continue to monitor. Call with any concerns. Refills given.   

## 2022-01-08 NOTE — Assessment & Plan Note (Signed)
Reassured patient. Continue to monitor.  

## 2022-01-08 NOTE — Assessment & Plan Note (Signed)
Rechecking labs today. Await results. Treat as needed.  °

## 2022-01-09 LAB — COMPREHENSIVE METABOLIC PANEL
ALT: 19 IU/L (ref 0–44)
AST: 24 IU/L (ref 0–40)
Albumin/Globulin Ratio: 2.3 — ABNORMAL HIGH (ref 1.2–2.2)
Albumin: 4.3 g/dL (ref 3.9–4.9)
Alkaline Phosphatase: 106 IU/L (ref 44–121)
BUN/Creatinine Ratio: 22 (ref 10–24)
BUN: 26 mg/dL (ref 8–27)
Bilirubin Total: 0.3 mg/dL (ref 0.0–1.2)
CO2: 26 mmol/L (ref 20–29)
Calcium: 9.2 mg/dL (ref 8.6–10.2)
Chloride: 103 mmol/L (ref 96–106)
Creatinine, Ser: 1.2 mg/dL (ref 0.76–1.27)
Globulin, Total: 1.9 g/dL (ref 1.5–4.5)
Glucose: 99 mg/dL (ref 70–99)
Potassium: 4.2 mmol/L (ref 3.5–5.2)
Sodium: 141 mmol/L (ref 134–144)
Total Protein: 6.2 g/dL (ref 6.0–8.5)
eGFR: 65 mL/min/{1.73_m2} (ref >59)

## 2022-01-09 LAB — LIPID PANEL W/O CHOL/HDL RATIO
Cholesterol, Total: 199 mg/dL (ref 100–199)
HDL: 30 mg/dL — ABNORMAL LOW (ref >39)
LDL Chol Calc (NIH): 128 mg/dL — ABNORMAL HIGH (ref 0–99)
Triglycerides: 231 mg/dL — ABNORMAL HIGH (ref 0–149)
VLDL Cholesterol Cal: 41 mg/dL — ABNORMAL HIGH (ref 5–40)

## 2022-01-09 LAB — CBC WITH DIFFERENTIAL/PLATELET
Basophils Absolute: 0 10*3/uL (ref 0.0–0.2)
Basos: 0 %
EOS (ABSOLUTE): 0.2 10*3/uL (ref 0.0–0.4)
Eos: 2 %
Hematocrit: 38.8 % (ref 37.5–51.0)
Hemoglobin: 13.1 g/dL (ref 13.0–17.7)
Immature Grans (Abs): 0 10*3/uL (ref 0.0–0.1)
Immature Granulocytes: 0 %
Lymphocytes Absolute: 1.3 10*3/uL (ref 0.7–3.1)
Lymphs: 14 %
MCH: 30.3 pg (ref 26.6–33.0)
MCHC: 33.8 g/dL (ref 31.5–35.7)
MCV: 90 fL (ref 79–97)
Monocytes Absolute: 0.6 10*3/uL (ref 0.1–0.9)
Monocytes: 6 %
Neutrophils Absolute: 7.6 10*3/uL — ABNORMAL HIGH (ref 1.4–7.0)
Neutrophils: 78 %
Platelets: 276 10*3/uL (ref 150–450)
RBC: 4.33 x10E6/uL (ref 4.14–5.80)
RDW: 11.9 % (ref 11.6–15.4)
WBC: 9.8 10*3/uL (ref 3.4–10.8)

## 2022-01-09 LAB — TSH: TSH: 1.84 u[IU]/mL (ref 0.450–4.500)

## 2022-01-09 LAB — VITAMIN D 25 HYDROXY (VIT D DEFICIENCY, FRACTURES): Vit D, 25-Hydroxy: 32.6 ng/mL (ref 30.0–100.0)

## 2022-01-14 DIAGNOSIS — D485 Neoplasm of uncertain behavior of skin: Secondary | ICD-10-CM | POA: Diagnosis not present

## 2022-01-14 DIAGNOSIS — C44311 Basal cell carcinoma of skin of nose: Secondary | ICD-10-CM | POA: Diagnosis not present

## 2022-01-28 DIAGNOSIS — Z8521 Personal history of malignant neoplasm of larynx: Secondary | ICD-10-CM | POA: Diagnosis not present

## 2022-01-28 DIAGNOSIS — C329 Malignant neoplasm of larynx, unspecified: Secondary | ICD-10-CM | POA: Diagnosis not present

## 2022-01-28 DIAGNOSIS — Z923 Personal history of irradiation: Secondary | ICD-10-CM | POA: Diagnosis not present

## 2022-01-28 DIAGNOSIS — Z87891 Personal history of nicotine dependence: Secondary | ICD-10-CM | POA: Diagnosis not present

## 2022-01-28 DIAGNOSIS — E038 Other specified hypothyroidism: Secondary | ICD-10-CM | POA: Diagnosis not present

## 2022-02-10 DIAGNOSIS — M4726 Other spondylosis with radiculopathy, lumbar region: Secondary | ICD-10-CM | POA: Diagnosis not present

## 2022-02-15 DIAGNOSIS — M4726 Other spondylosis with radiculopathy, lumbar region: Secondary | ICD-10-CM | POA: Diagnosis not present

## 2022-02-18 DIAGNOSIS — M4726 Other spondylosis with radiculopathy, lumbar region: Secondary | ICD-10-CM | POA: Diagnosis not present

## 2022-02-19 ENCOUNTER — Telehealth: Payer: Self-pay

## 2022-02-19 NOTE — Progress Notes (Cosign Needed)
Care Management & Coordination Services Pharmacy Team  Reason for Encounter: General adherence update   Contacted patient for general health update and medication adherence call.  Unsuccessful outreach. Left voicemail for patient to return call.   Recent office visits:  01/08/22-Mega Annia Friendly, DO (PCP) Seen for comprehensive medical examination. Labs ordered. Toradol 60 mg injection given. Ambulatory referral to Physical Therapy. Td given and flu vaccine given as well. Follow up in 6 months.   Recent consult visits:  12/16/21-Russel Nicholes Mango (Otolaryngology) Notes not available.  12/11/21-Mohit Bansal (Internal medicine) Notes not available.  11/02/21-Lauren Ivin Booty (Otolaryngology) Notes not available.  09/16/21-Kahmke, Jeanella Flattery, MD (Otolaryngology) Seen for routine caner surveillance. Follow up in 3 months.  09/08/21-Walter Ulysees Barns (Otolaryngology)   Hospital visits:  None in previous 6 months  Medications: Outpatient Encounter Medications as of 02/19/2022  Medication Sig   aspirin EC 81 MG tablet Take 1 tablet (81 mg total) by mouth daily. Swallow whole.   baclofen (LIORESAL) 10 MG tablet Take 0.5-1 tablets (5-10 mg total) by mouth at bedtime as needed for muscle spasms.   diclofenac Sodium (VOLTAREN) 1 % GEL Apply 4 g topically 4 (four) times daily.   gabapentin (NEURONTIN) 100 MG capsule Take 1 capsule (100 mg total) by mouth at bedtime.   levothyroxine (SYNTHROID) 100 MCG tablet TAKE 1 TABLET(100 MCG) BY MOUTH DAILY BEFORE BREAKFAST   magic mouthwash SOLN Take 5 mLs by mouth 3 (three) times daily as needed for mouth pain.   meloxicam (MOBIC) 15 MG tablet TAKE 1 TABLET EVERY DAY   Multiple Vitamin (MULTIVITAMIN WITH MINERALS) TABS tablet Take 1 tablet by mouth daily. One-A-Day Multivitamin   omeprazole (PRILOSEC) 20 MG capsule Take 1 capsule (20 mg total) by mouth daily.   rosuvastatin (CRESTOR) 5 MG tablet Take 1 tablet (5 mg total) by mouth 2 (two) times a  week.   sucralfate (CARAFATE) 1 g tablet Take 1 g by mouth once.   No facility-administered encounter medications on file as of 02/19/2022.    Recent vitals BP Readings from Last 3 Encounters:  01/08/22 135/81  06/29/21 118/76  06/01/21 131/84   Pulse Readings from Last 3 Encounters:  01/08/22 63  06/29/21 66  06/01/21 (!) 58   Wt Readings from Last 3 Encounters:  01/08/22 207 lb (93.9 kg)  06/29/21 203 lb (92.1 kg)  06/01/21 205 lb 12.8 oz (93.4 kg)   BMI Readings from Last 3 Encounters:  01/08/22 27.32 kg/m  06/29/21 26.79 kg/m  06/01/21 27.16 kg/m    Recent lab results    Component Value Date/Time   NA 141 01/08/2022 1530   NA 141 08/20/2013 1504   K 4.2 01/08/2022 1530   K 4.1 08/20/2013 1504   CL 103 01/08/2022 1530   CL 105 08/20/2013 1504   CO2 26 01/08/2022 1530   CO2 29 08/20/2013 1504   GLUCOSE 99 01/08/2022 1530   GLUCOSE 97 03/21/2019 1238   GLUCOSE 123 (H) 08/20/2013 1504   BUN 26 01/08/2022 1530   BUN 29 (H) 08/20/2013 1504   CREATININE 1.20 01/08/2022 1530   CREATININE 1.05 02/09/2019 1003   CALCIUM 9.2 01/08/2022 1530   CALCIUM 8.8 08/20/2013 1504    Lab Results  Component Value Date   CREATININE 1.20 01/08/2022   EGFR 65 01/08/2022   GFRNONAA 45 (L) 09/07/2019   GFRAA 52 (L) 09/07/2019   Lab Results  Component Value Date/Time   HGBA1C 6.3 (H) 01/08/2022 03:28 PM   HGBA1C 6.0 (H)  06/01/2021 10:37 AM   MICROALBUR 80 (H) 06/01/2021 10:37 AM   MICROALBUR 150 (H) 12/16/2020 04:22 PM    Lab Results  Component Value Date   CHOL 199 01/08/2022   HDL 30 (L) 01/08/2022   LDLCALC 128 (H) 01/08/2022   TRIG 231 (H) 01/08/2022   CHOLHDL 4.9 02/09/2019    Care Gaps: Annual wellness visit in last year? Yes   Star Rating Drugs:  Rosuvastatin 5 mg Last filled:  Corrie Mckusick, RMA

## 2022-02-22 DIAGNOSIS — M4726 Other spondylosis with radiculopathy, lumbar region: Secondary | ICD-10-CM | POA: Diagnosis not present

## 2022-02-25 DIAGNOSIS — M4726 Other spondylosis with radiculopathy, lumbar region: Secondary | ICD-10-CM | POA: Diagnosis not present

## 2022-03-01 DIAGNOSIS — M4726 Other spondylosis with radiculopathy, lumbar region: Secondary | ICD-10-CM | POA: Diagnosis not present

## 2022-03-03 DIAGNOSIS — R491 Aphonia: Secondary | ICD-10-CM | POA: Diagnosis not present

## 2022-03-04 DIAGNOSIS — M4726 Other spondylosis with radiculopathy, lumbar region: Secondary | ICD-10-CM | POA: Diagnosis not present

## 2022-03-08 DIAGNOSIS — M4726 Other spondylosis with radiculopathy, lumbar region: Secondary | ICD-10-CM | POA: Diagnosis not present

## 2022-03-11 DIAGNOSIS — M4726 Other spondylosis with radiculopathy, lumbar region: Secondary | ICD-10-CM | POA: Diagnosis not present

## 2022-03-16 DIAGNOSIS — M4726 Other spondylosis with radiculopathy, lumbar region: Secondary | ICD-10-CM | POA: Diagnosis not present

## 2022-03-17 DIAGNOSIS — Z9002 Acquired absence of larynx: Secondary | ICD-10-CM | POA: Diagnosis not present

## 2022-03-17 DIAGNOSIS — R1312 Dysphagia, oropharyngeal phase: Secondary | ICD-10-CM | POA: Diagnosis not present

## 2022-03-17 DIAGNOSIS — Z8521 Personal history of malignant neoplasm of larynx: Secondary | ICD-10-CM | POA: Diagnosis not present

## 2022-03-17 DIAGNOSIS — Z08 Encounter for follow-up examination after completed treatment for malignant neoplasm: Secondary | ICD-10-CM | POA: Diagnosis not present

## 2022-03-17 DIAGNOSIS — Z923 Personal history of irradiation: Secondary | ICD-10-CM | POA: Diagnosis not present

## 2022-03-19 DIAGNOSIS — M4726 Other spondylosis with radiculopathy, lumbar region: Secondary | ICD-10-CM | POA: Diagnosis not present

## 2022-03-23 DIAGNOSIS — M4726 Other spondylosis with radiculopathy, lumbar region: Secondary | ICD-10-CM | POA: Diagnosis not present

## 2022-03-26 DIAGNOSIS — M4726 Other spondylosis with radiculopathy, lumbar region: Secondary | ICD-10-CM | POA: Diagnosis not present

## 2022-03-30 DIAGNOSIS — M4726 Other spondylosis with radiculopathy, lumbar region: Secondary | ICD-10-CM | POA: Diagnosis not present

## 2022-03-31 DIAGNOSIS — Z85828 Personal history of other malignant neoplasm of skin: Secondary | ICD-10-CM | POA: Insufficient documentation

## 2022-03-31 DIAGNOSIS — C44311 Basal cell carcinoma of skin of nose: Secondary | ICD-10-CM | POA: Diagnosis not present

## 2022-04-02 DIAGNOSIS — M4726 Other spondylosis with radiculopathy, lumbar region: Secondary | ICD-10-CM | POA: Diagnosis not present

## 2022-04-19 ENCOUNTER — Telehealth: Payer: Self-pay

## 2022-04-19 NOTE — Progress Notes (Cosign Needed)
Care Management & Coordination Services Pharmacy Team  Reason for Encounter: General adherence update   Contacted patient for general health update and medication adherence call.  Unsuccessful outreach. Left voicemail for patient to return call.   Recent office visits:  01/08/22-Mega Luke BodilyP. Johnson, DO (PCP) Seen for comprehensive medical examination. Labs ordered. Toradol 60 mg injection given. Ambulatory referral to Physical Therapy. Td given and flu vaccine given as well. Follow up in 6 months.   Recent consult visits:  01/14/22-Ana Benitez-Graham (Dermatology) Notes not available.  02/10/22-Kristen Graciella BeltonYager (Physical Therapy) Notes not available.  02/15/22-Kristen Graciella BeltonYager (Physical Therapy) Notes not available.  12/16/21-Russel Theadora Ramaoy [name] (Otolaryngology) Notes not available.  12/11/21-Mohit Bansal (Internal medicine) Notes not available.  11/02/21-Lauren Sherrilyn RistElizabeth Schilke (Otolaryngology) Notes not available.  09/16/21-[name], Luke Braceussel Roy, MD (Otolaryngology) Seen for routine caner surveillance. Follow up in 3 months.  09/08/21-Walter Renelda Lomasong [name] (Otolaryngology)   Hospital visits:  None in previous 6 months  Medications: Outpatient Encounter Medications as of 04/19/2022  Medication Sig   aspirin EC 81 MG tablet Take 1 tablet (81 mg total) by mouth daily. Swallow whole.   baclofen (LIORESAL) 10 MG tablet Take 0.5-1 tablets (5-10 mg total) by mouth at bedtime as needed for muscle spasms.   diclofenac Sodium (VOLTAREN) 1 % GEL Apply 4 g topically 4 (four) times daily.   gabapentin (NEURONTIN) 100 MG capsule Take 1 capsule (100 mg total) by mouth at bedtime.   levothyroxine (SYNTHROID) 100 MCG tablet TAKE 1 TABLET(100 MCG) BY MOUTH DAILY BEFORE BREAKFAST   magic mouthwash SOLN Take 5 mLs by mouth 3 (three) times daily as needed for mouth pain.   meloxicam (MOBIC) 15 MG tablet TAKE 1 TABLET EVERY DAY   Multiple Vitamin (MULTIVITAMIN WITH MINERALS) TABS tablet Take 1 tablet by mouth daily.  One-A-Day Multivitamin   omeprazole (PRILOSEC) 20 MG capsule Take 1 capsule (20 mg total) by mouth daily.   rosuvastatin (CRESTOR) 5 MG tablet Take 1 tablet (5 mg total) by mouth 2 (two) times a week.   sucralfate (CARAFATE) 1 g tablet Take 1 g by mouth once.   No facility-administered encounter medications on file as of 04/19/2022.    Recent vitals BP Readings from Last 3 Encounters:  01/08/22 135/81  06/29/21 118/76  06/01/21 131/84   Pulse Readings from Last 3 Encounters:  01/08/22 63  06/29/21 66  06/01/21 (!) 58   Wt Readings from Last 3 Encounters:  01/08/22 207 lb (93.9 kg)  06/29/21 203 lb (92.1 kg)  06/01/21 205 lb 12.8 oz (93.4 kg)   BMI Readings from Last 3 Encounters:  01/08/22 27.32 kg/m  06/29/21 26.79 kg/m  06/01/21 27.16 kg/m    Recent lab results    Component Value Date/Time   NA 141 01/08/2022 1530   NA 141 08/20/2013 1504   K 4.2 01/08/2022 1530   K 4.1 08/20/2013 1504   CL 103 01/08/2022 1530   CL 105 08/20/2013 1504   CO2 26 01/08/2022 1530   CO2 29 08/20/2013 1504   GLUCOSE 99 01/08/2022 1530   GLUCOSE 97 03/21/2019 1238   GLUCOSE 123 (H) 08/20/2013 1504   BUN 26 01/08/2022 1530   BUN 29 (H) 08/20/2013 1504   CREATININE 1.20 01/08/2022 1530   CREATININE 1.05 02/09/2019 1003   CALCIUM 9.2 01/08/2022 1530   CALCIUM 8.8 08/20/2013 1504    Lab Results  Component Value Date   CREATININE 1.20 01/08/2022   EGFR 65 01/08/2022   GFRNONAA 45 (L) 09/07/2019   GFRAA 52 (  L) 09/07/2019   Lab Results  Component Value Date/Time   HGBA1C 6.3 (H) 01/08/2022 03:28 PM   HGBA1C 6.0 (H) 06/01/2021 10:37 AM   MICROALBUR 80 (H) 06/01/2021 10:37 AM   MICROALBUR 150 (H) 12/16/2020 04:22 PM    Lab Results  Component Value Date   CHOL 199 01/08/2022   HDL 30 (L) 01/08/2022   LDLCALC 128 (H) 01/08/2022   TRIG 231 (H) 01/08/2022   CHOLHDL 4.9 02/09/2019    Care Gaps: Annual wellness visit in last year? Yes  Star Rating Drugs:  Rosuvastatin 5 mg  Last filled:01/08/22 90 DS  Myriam Carolin Coy, RMA

## 2022-05-11 DIAGNOSIS — R491 Aphonia: Secondary | ICD-10-CM | POA: Diagnosis not present

## 2022-05-11 DIAGNOSIS — Z9002 Acquired absence of larynx: Secondary | ICD-10-CM | POA: Diagnosis not present

## 2022-06-01 DIAGNOSIS — Z9002 Acquired absence of larynx: Secondary | ICD-10-CM | POA: Diagnosis not present

## 2022-06-01 DIAGNOSIS — R491 Aphonia: Secondary | ICD-10-CM | POA: Diagnosis not present

## 2022-06-16 ENCOUNTER — Telehealth: Payer: Self-pay

## 2022-06-16 NOTE — Progress Notes (Cosign Needed)
Care Management & Coordination Services Pharmacy Team  Reason for Encounter: General adherence update   Contacted patient for general health update and medication adherence call.  Unsuccessful outreach. Left voicemail for patient to return call.   Recent office visits:  01/08/22-Megan Holly Bodily, DO (PCP) Seen for comprehensive medical examination. Labs ordered. Tetanus injection given. Flu vaccine given. Follow up in 6 months.   Recent consult visits:  05/11/22-[facility] (General system) Notes not available.  04/02/22-Chris Crusan (Physical therapy) Notes not available.  03/17/22-Russel Theadora Rama (Otolaryngology) Notes not available.  03/03/22-[facility] (General system) Notes not available. 01/28/22-Neal Ramon Dredge Ready (Oncology) Notes not available.  01/14/22-Ana Benitez-Graham (Dermatology) Notes not available.  12/16/21-Russel Theadora Rama (Otolaryngology) Notes not available.   Hospital visits:  None in previous 6 months  Medications: Outpatient Encounter Medications as of 06/16/2022  Medication Sig   aspirin EC 81 MG tablet Take 1 tablet (81 mg total) by mouth daily. Swallow whole.   baclofen (LIORESAL) 10 MG tablet Take 0.5-1 tablets (5-10 mg total) by mouth at bedtime as needed for muscle spasms.   diclofenac Sodium (VOLTAREN) 1 % GEL Apply 4 g topically 4 (four) times daily.   gabapentin (NEURONTIN) 100 MG capsule Take 1 capsule (100 mg total) by mouth at bedtime.   levothyroxine (SYNTHROID) 100 MCG tablet TAKE 1 TABLET(100 MCG) BY MOUTH DAILY BEFORE BREAKFAST   magic mouthwash SOLN Take 5 mLs by mouth 3 (three) times daily as needed for mouth pain.   meloxicam (MOBIC) 15 MG tablet TAKE 1 TABLET EVERY DAY   Multiple Vitamin (MULTIVITAMIN WITH MINERALS) TABS tablet Take 1 tablet by mouth daily. One-A-Day Multivitamin   omeprazole (PRILOSEC) 20 MG capsule Take 1 capsule (20 mg total) by mouth daily.   rosuvastatin (CRESTOR) 5 MG tablet Take 1 tablet  (5 mg total) by mouth 2 (two) times a week.   sucralfate (CARAFATE) 1 g tablet Take 1 g by mouth once.   No facility-administered encounter medications on file as of 06/16/2022.    Recent vitals BP Readings from Last 3 Encounters:  01/08/22 135/81  06/29/21 118/76  06/01/21 131/84   Pulse Readings from Last 3 Encounters:  01/08/22 63  06/29/21 66  06/01/21 (!) 58   Wt Readings from Last 3 Encounters:  01/08/22 207 lb (93.9 kg)  06/29/21 203 lb (92.1 kg)  06/01/21 205 lb 12.8 oz (93.4 kg)   BMI Readings from Last 3 Encounters:  01/08/22 27.32 kg/m  06/29/21 26.79 kg/m  06/01/21 27.16 kg/m    Recent lab results    Component Value Date/Time   NA 141 01/08/2022 1530   NA 141 08/20/2013 1504   K 4.2 01/08/2022 1530   K 4.1 08/20/2013 1504   CL 103 01/08/2022 1530   CL 105 08/20/2013 1504   CO2 26 01/08/2022 1530   CO2 29 08/20/2013 1504   GLUCOSE 99 01/08/2022 1530   GLUCOSE 97 03/21/2019 1238   GLUCOSE 123 (H) 08/20/2013 1504   BUN 26 01/08/2022 1530   BUN 29 (H) 08/20/2013 1504   CREATININE 1.20 01/08/2022 1530   CREATININE 1.05 02/09/2019 1003   CALCIUM 9.2 01/08/2022 1530   CALCIUM 8.8 08/20/2013 1504    Lab Results  Component Value Date   CREATININE 1.20 01/08/2022   EGFR 65 01/08/2022   GFRNONAA 45 (L) 09/07/2019   GFRAA 52 (L) 09/07/2019   Lab Results  Component Value Date/Time   HGBA1C 6.3 (H) 01/08/2022 03:28 PM   HGBA1C 6.0 (  H) 06/01/2021 10:37 AM   MICROALBUR 80 (H) 06/01/2021 10:37 AM   MICROALBUR 150 (H) 12/16/2020 04:22 PM    Lab Results  Component Value Date   CHOL 199 01/08/2022   HDL 30 (L) 01/08/2022   LDLCALC 128 (H) 01/08/2022   TRIG 231 (H) 01/08/2022   CHOLHDL 4.9 02/09/2019    Care Gaps: Annual wellness visit in last year? Yes  If Diabetic: Last eye exam / retinopathy screening:N/A Last diabetic foot exam:N/A Last UACR: N/A  Star Rating Drugs:  Rosuvastatin 5 mg Last filled:10/20/21 90 DS, 01/08/22 90 DS   Myriam  Carolin Coy, RMA

## 2022-07-06 ENCOUNTER — Ambulatory Visit: Payer: Medicare HMO | Admitting: Family Medicine

## 2022-07-09 ENCOUNTER — Ambulatory Visit: Payer: Medicare HMO | Admitting: Family Medicine

## 2022-07-09 DIAGNOSIS — Z8521 Personal history of malignant neoplasm of larynx: Secondary | ICD-10-CM | POA: Diagnosis not present

## 2022-07-09 DIAGNOSIS — R491 Aphonia: Secondary | ICD-10-CM | POA: Diagnosis not present

## 2022-07-09 DIAGNOSIS — Z9002 Acquired absence of larynx: Secondary | ICD-10-CM | POA: Diagnosis not present

## 2022-07-09 DIAGNOSIS — Z923 Personal history of irradiation: Secondary | ICD-10-CM | POA: Diagnosis not present

## 2022-08-06 DIAGNOSIS — E038 Other specified hypothyroidism: Secondary | ICD-10-CM | POA: Diagnosis not present

## 2022-08-06 DIAGNOSIS — F17210 Nicotine dependence, cigarettes, uncomplicated: Secondary | ICD-10-CM | POA: Diagnosis not present

## 2022-08-06 DIAGNOSIS — E039 Hypothyroidism, unspecified: Secondary | ICD-10-CM | POA: Diagnosis not present

## 2022-08-06 DIAGNOSIS — R918 Other nonspecific abnormal finding of lung field: Secondary | ICD-10-CM | POA: Diagnosis not present

## 2022-08-06 DIAGNOSIS — C320 Malignant neoplasm of glottis: Secondary | ICD-10-CM | POA: Diagnosis not present

## 2022-08-06 DIAGNOSIS — Z8521 Personal history of malignant neoplasm of larynx: Secondary | ICD-10-CM | POA: Diagnosis not present

## 2022-08-06 DIAGNOSIS — I7121 Aneurysm of the ascending aorta, without rupture: Secondary | ICD-10-CM | POA: Diagnosis not present

## 2022-08-06 DIAGNOSIS — Z8589 Personal history of malignant neoplasm of other organs and systems: Secondary | ICD-10-CM | POA: Diagnosis not present

## 2022-08-06 DIAGNOSIS — Z9221 Personal history of antineoplastic chemotherapy: Secondary | ICD-10-CM | POA: Diagnosis not present

## 2022-08-06 DIAGNOSIS — Z87891 Personal history of nicotine dependence: Secondary | ICD-10-CM | POA: Diagnosis not present

## 2022-08-13 DIAGNOSIS — C44311 Basal cell carcinoma of skin of nose: Secondary | ICD-10-CM | POA: Diagnosis not present

## 2022-08-20 ENCOUNTER — Ambulatory Visit (INDEPENDENT_AMBULATORY_CARE_PROVIDER_SITE_OTHER): Payer: Medicare HMO | Admitting: Family Medicine

## 2022-08-20 ENCOUNTER — Encounter: Payer: Self-pay | Admitting: Family Medicine

## 2022-08-20 VITALS — BP 100/66 | HR 62 | Temp 98.3°F | Wt 208.6 lb

## 2022-08-20 DIAGNOSIS — I10 Essential (primary) hypertension: Secondary | ICD-10-CM | POA: Diagnosis not present

## 2022-08-20 DIAGNOSIS — Z1211 Encounter for screening for malignant neoplasm of colon: Secondary | ICD-10-CM

## 2022-08-20 DIAGNOSIS — D692 Other nonthrombocytopenic purpura: Secondary | ICD-10-CM | POA: Diagnosis not present

## 2022-08-20 DIAGNOSIS — J432 Centrilobular emphysema: Secondary | ICD-10-CM | POA: Diagnosis not present

## 2022-08-20 DIAGNOSIS — R7301 Impaired fasting glucose: Secondary | ICD-10-CM | POA: Diagnosis not present

## 2022-08-20 DIAGNOSIS — Z23 Encounter for immunization: Secondary | ICD-10-CM | POA: Diagnosis not present

## 2022-08-20 DIAGNOSIS — E039 Hypothyroidism, unspecified: Secondary | ICD-10-CM | POA: Diagnosis not present

## 2022-08-20 DIAGNOSIS — E7800 Pure hypercholesterolemia, unspecified: Secondary | ICD-10-CM

## 2022-08-20 DIAGNOSIS — I70213 Atherosclerosis of native arteries of extremities with intermittent claudication, bilateral legs: Secondary | ICD-10-CM | POA: Diagnosis not present

## 2022-08-20 MED ORDER — ROSUVASTATIN CALCIUM 5 MG PO TABS: 5.0000 mg | ORAL_TABLET | ORAL | 0 refills | Status: DC

## 2022-08-20 MED ORDER — SILDENAFIL CITRATE 100 MG PO TABS: 50.0000 mg | ORAL_TABLET | Freq: Every day | ORAL | 11 refills | Status: DC | PRN

## 2022-08-20 NOTE — Assessment & Plan Note (Signed)
Rechecking labs today. Await results. Treat as needed.  °

## 2022-08-20 NOTE — Assessment & Plan Note (Signed)
Doing well off medicine. Labs drawn today. Await results. Treat as needed.

## 2022-08-20 NOTE — Progress Notes (Signed)
BP 100/66   Pulse 62   Temp 98.3 F (36.8 C) (Oral)   Wt 208 lb 9.6 oz (94.6 kg)   SpO2 96%   BMI 27.53 kg/m    Subjective:    Patient ID: Luke [name], male    DOB: [birth year], 72 y.o.   MRN: [id number]  HPI: Luke [name] is a 72 y.o. male  Chief Complaint  Patient presents with   Hyperlipidemia   Hypothyroidism   HYPERLIPIDEMIA Hyperlipidemia status: stable Satisfied with current treatment?  yes Side effects:  no Medication compliance: good compliance Past cholesterol meds: crestor Supplements: none Aspirin:  no The ASCVD Risk score (Arnett DK, et al., 2019) failed to calculate for the following reasons:   The patient has a prior MI or stroke diagnosis Chest pain:  no Coronary artery disease:  no Family history CAD:  yes Family history early CAD:  no  HYPOTHYROIDISM Thyroid control status:controlled Satisfied with current treatment? yes Medication side effects: yes Medication compliance: excellent compliance Recent dose adjustment:no Fatigue: yes Cold intolerance: no Heat intolerance: no Weight gain: no Weight loss: no Constipation: no Diarrhea/loose stools: no Palpitations: no Lower extremity edema: no Anxiety/depressed mood: no   Relevant past medical, surgical, family and social history reviewed and updated as indicated. Interim medical history since our last visit reviewed. Allergies and medications reviewed and updated.  Review of Systems  Constitutional: Negative.   Respiratory: Negative.    Cardiovascular: Negative.   Gastrointestinal: Negative.   Musculoskeletal: Negative.   Neurological: Negative.   Psychiatric/Behavioral: Negative.      Per HPI unless specifically indicated above     Objective:    BP 100/66   Pulse 62   Temp 98.3 F (36.8 C) (Oral)   Wt 208 lb 9.6 oz (94.6 kg)   SpO2 96%   BMI 27.53 kg/m   Wt Readings from Last 3 Encounters:  08/20/22 208 lb 9.6 oz (94.6 kg)  01/08/22 207 lb (93.9 kg)  06/29/21 203  lb (92.1 kg)    Physical Exam Vitals and nursing note reviewed.  Constitutional:      General: He is not in acute distress.    Appearance: Normal appearance. He is normal weight. He is not ill-appearing, toxic-appearing or diaphoretic.  HENT:     Head: Normocephalic and atraumatic.     Right Ear: External ear normal.     Left Ear: External ear normal.     Nose: Nose normal.     Mouth/Throat:     Mouth: Mucous membranes are moist.     Pharynx: Oropharynx is clear.  Eyes:     General: No scleral icterus.       Right eye: No discharge.        Left eye: No discharge.     Extraocular Movements: Extraocular movements intact.     Conjunctiva/sclera: Conjunctivae normal.     Pupils: Pupils are equal, round, and reactive to light.  Cardiovascular:     Rate and Rhythm: Normal rate and regular rhythm.     Pulses: Normal pulses.     Heart sounds: Normal heart sounds. No murmur heard.    No friction rub. No gallop.  Pulmonary:     Effort: Pulmonary effort is normal. No respiratory distress.     Breath sounds: Normal breath sounds. No stridor. No wheezing, rhonchi or rales.  Chest:     Chest wall: No tenderness.  Musculoskeletal:        General: Normal range of motion.  Cervical back: Normal range of motion and neck supple.  Skin:    General: Skin is warm and dry.     Capillary Refill: Capillary refill takes less than 2 seconds.     Coloration: Skin is not jaundiced or pale.     Findings: No bruising, erythema, lesion or rash.  Neurological:     General: No focal deficit present.     Mental Status: He is alert and oriented to person, place, and time. Mental status is at baseline.  Psychiatric:        Mood and Affect: Mood normal.        Behavior: Behavior normal.        Thought Content: Thought content normal.        Judgment: Judgment normal.     Results for orders placed or performed in visit on 01/08/22  CBC with Differential/Platelet  Result Value Ref Range   WBC 9.8  3.4 - 10.8 x10E3/uL   RBC 4.33 4.14 - 5.80 x10E6/uL   Hemoglobin 13.1 13.0 - 17.7 g/dL   Hematocrit 63.8 75.6 - 51.0 %   MCV 90 79 - 97 fL   MCH 30.3 26.6 - 33.0 pg   MCHC 33.8 31.5 - 35.7 g/dL   RDW 43.3 29.5 - 18.8 %   Platelets 276 150 - 450 x10E3/uL   Neutrophils 78 Not Estab. %   Lymphs 14 Not Estab. %   Monocytes 6 Not Estab. %   Eos 2 Not Estab. %   Basos 0 Not Estab. %   Neutrophils Absolute 7.6 (H) 1.4 - 7.0 x10E3/uL   Lymphocytes Absolute 1.3 0.7 - 3.1 x10E3/uL   Monocytes Absolute 0.6 0.1 - 0.9 x10E3/uL   EOS (ABSOLUTE) 0.2 0.0 - 0.4 x10E3/uL   Basophils Absolute 0.0 0.0 - 0.2 x10E3/uL   Immature Granulocytes 0 Not Estab. %   Immature Grans (Abs) 0.0 0.0 - 0.1 x10E3/uL  Bayer DCA Hb A1c Waived  Result Value Ref Range   HB A1C (BAYER DCA - WAIVED) 6.3 (H) 4.8 - 5.6 %  Comprehensive metabolic panel  Result Value Ref Range   Glucose 99 70 - 99 mg/dL   BUN 26 8 - 27 mg/dL   Creatinine, Ser 4.16 0.76 - 1.27 mg/dL   eGFR 65 >60 YT/KZS/0.10   BUN/Creatinine Ratio 22 10 - 24   Sodium 141 134 - 144 mmol/L   Potassium 4.2 3.5 - 5.2 mmol/L   Chloride 103 96 - 106 mmol/L   CO2 26 20 - 29 mmol/L   Calcium 9.2 8.6 - 10.2 mg/dL   Total Protein 6.2 6.0 - 8.5 g/dL   Albumin 4.3 3.9 - 4.9 g/dL   Globulin, Total 1.9 1.5 - 4.5 g/dL   Albumin/Globulin Ratio 2.3 (H) 1.2 - 2.2   Bilirubin Total 0.3 0.0 - 1.2 mg/dL   Alkaline Phosphatase 106 44 - 121 IU/L   AST 24 0 - 40 IU/L   ALT 19 0 - 44 IU/L  Lipid Panel w/o Chol/HDL Ratio  Result Value Ref Range   Cholesterol, Total 199 100 - 199 mg/dL   Triglycerides 932 (H) 0 - 149 mg/dL   HDL 30 (L) >35 mg/dL   VLDL Cholesterol Cal 41 (H) 5 - 40 mg/dL   LDL Chol Calc (NIH) 573 (H) 0 - 99 mg/dL  TSH  Result Value Ref Range   TSH 1.840 0.450 - 4.500 uIU/mL  VITAMIN D 25 Hydroxy (Vit-D Deficiency, Fractures)  Result Value Ref Range   Vit  D, 25-Hydroxy 32.6 30.0 - 100.0 ng/mL      Assessment & Plan:   Problem List Items Addressed  This Visit       Cardiovascular and Mediastinum   Benign essential hypertension - Primary    Doing well off medicine. Labs drawn today. Await results. Treat as needed.       Relevant Medications   sildenafil (VIAGRA) 100 MG tablet   rosuvastatin (CRESTOR) 5 MG tablet (Start on 08/23/2022)   Other Relevant Orders   CBC with Differential/Platelet   Comprehensive metabolic panel   Atherosclerosis of native arteries of extremity with intermittent claudication (HCC)    Will keep BP and cholesterol under good control. Continue current regimen. Call with any concerns.       Relevant Medications   sildenafil (VIAGRA) 100 MG tablet   rosuvastatin (CRESTOR) 5 MG tablet (Start on 08/23/2022)   Senile purpura (HCC)    Reassured patient. Continue to monitor.       Relevant Medications   sildenafil (VIAGRA) 100 MG tablet   rosuvastatin (CRESTOR) 5 MG tablet (Start on 08/23/2022)     Respiratory   Centrilobular emphysema (HCC)    Under good control on current regimen. Continue current regimen. Continue to monitor. Call with any concerns. Refills given.          Endocrine   Hypothyroidism    Rechecking labs today. Await results. Treat as needed.       Relevant Orders   CBC with Differential/Platelet   Comprehensive metabolic panel   TSH   IFG (impaired fasting glucose)    Rechecking labs today. Await results. Treat as needed.       Relevant Orders   CBC with Differential/Platelet   Comprehensive metabolic panel     Other   Pure hypercholesterolemia    Under good control on current regimen. Continue current regimen. Continue to monitor. Call with any concerns. Refills given. Labs drawn today.        Relevant Medications   sildenafil (VIAGRA) 100 MG tablet   rosuvastatin (CRESTOR) 5 MG tablet (Start on 08/23/2022)   Other Relevant Orders   CBC with Differential/Platelet   Comprehensive metabolic panel   Lipid Panel w/o Chol/HDL Ratio   Other Visit Diagnoses      Screening for colon cancer       Referral to GI placed today.   Relevant Orders   Ambulatory referral to Gastroenterology   Need for vaccination for pneumococcus       Relevant Orders   Pneumococcal conjugate vaccine 20-valent (Prevnar 20) (Completed)        Follow up plan: Return in about 6 months (around 02/20/2023) for physical.

## 2022-08-20 NOTE — Assessment & Plan Note (Signed)
Reassured patient. Continue to monitor.  

## 2022-08-20 NOTE — Assessment & Plan Note (Signed)
 Will keep BP and cholesterol under good control. Continue current regimen. Call with any concerns.

## 2022-08-20 NOTE — Assessment & Plan Note (Signed)
Under good control on current regimen. Continue current regimen. Continue to monitor. Call with any concerns. Refills given.   

## 2022-08-20 NOTE — Assessment & Plan Note (Signed)
Under good control on current regimen. Continue current regimen. Continue to monitor. Call with any concerns. Refills given. Labs drawn today.   

## 2022-08-23 ENCOUNTER — Telehealth: Payer: Self-pay

## 2022-08-23 NOTE — Telephone Encounter (Signed)
Pt's sister Remus Loffler is returning your call to schedule her brother a colonoscopy. Pt's is unable to speak so she will need to schedule his procedure. Please call Katie at [phone].

## 2022-08-24 ENCOUNTER — Other Ambulatory Visit: Payer: Self-pay

## 2022-08-24 ENCOUNTER — Telehealth: Payer: Self-pay

## 2022-08-24 DIAGNOSIS — Z86010 Personal history of colonic polyps: Secondary | ICD-10-CM

## 2022-08-24 MED ORDER — NA SULFATE-K SULFATE-MG SULF 17.5-3.13-1.6 GM/177ML PO SOLN: 1.0000 | Freq: Once | ORAL | 0 refills | Status: AC

## 2022-08-24 NOTE — Telephone Encounter (Signed)
Gastroenterology Pre-Procedure Review  Request Date: 10/11/22 Requesting Physician: Dr. Servando Snare  PATIENT REVIEW QUESTIONS: The patient responded to the following health history questions as indicated:    1. Are you having any GI issues? no 2. Do you have a personal history of Polyps? yes (08/22/17 colonoscopy was performed by Dr. Tobi Bastos however pt sister started taking over his care in 2020 and his GI provider changed to Dr. Caryl Comes and Dr. Christella Hartigan.  He has not been seen by either of them since 2020  ) 3. Do you have a family history of Colon Cancer or Polyps? no 4. Diabetes Mellitus? no 5. Joint replacements in the past 12 months?no 6. Major health problems in the past 3 months?no 7. Any artificial heart valves, MVP, or defibrillator?no    MEDICATIONS & ALLERGIES:    Patient reports the following regarding taking any anticoagulation/antiplatelet therapy:   Plavix, Coumadin, Eliquis, Xarelto, Lovenox, Pradaxa, Brilinta, or Effient? no Aspirin? no  Patient confirms/reports the following medications:  Current Outpatient Medications  Medication Sig Dispense Refill   aspirin EC 81 MG tablet Take 1 tablet (81 mg total) by mouth daily. Swallow whole. 90 tablet 3   levothyroxine (SYNTHROID) 100 MCG tablet TAKE 1 TABLET(100 MCG) BY MOUTH DAILY BEFORE BREAKFAST 90 tablet 3   omeprazole (PRILOSEC) 20 MG capsule Take 1 capsule (20 mg total) by mouth daily. 90 capsule 2   rosuvastatin (CRESTOR) 5 MG tablet Take 1 tablet (5 mg total) by mouth 2 (two) times a week. 104 tablet 0   sildenafil (VIAGRA) 100 MG tablet Take 0.5-1 tablets (50-100 mg total) by mouth daily as needed for erectile dysfunction. 5 tablet 11   No current facility-administered medications for this visit.    Patient confirms/reports the following allergies:  No Known Allergies  No orders of the defined types were placed in this encounter.   AUTHORIZATION INFORMATION Primary Insurance: 1D#: Group #:  Secondary  Insurance: 1D#: Group #:  SCHEDULE INFORMATION: Date: 10/11/22 Time: Location: [facility]

## 2022-08-24 NOTE — Addendum Note (Signed)
Addended by: Avie Arenas on: 08/24/2022 09:25 AM   Modules accepted: Orders

## 2022-08-27 ENCOUNTER — Other Ambulatory Visit: Payer: Self-pay | Admitting: Family Medicine

## 2022-08-27 DIAGNOSIS — Z8572 Personal history of non-Hodgkin lymphomas: Secondary | ICD-10-CM

## 2022-08-27 DIAGNOSIS — E039 Hypothyroidism, unspecified: Secondary | ICD-10-CM

## 2022-08-27 MED ORDER — LEVOTHYROXINE SODIUM 112 MCG PO TABS
112.0000 ug | ORAL_TABLET | Freq: Every day | ORAL | 1 refills | Status: DC
Start: 2022-08-27 — End: 2022-11-12

## 2022-09-02 DIAGNOSIS — Z08 Encounter for follow-up examination after completed treatment for malignant neoplasm: Secondary | ICD-10-CM | POA: Diagnosis not present

## 2022-09-02 DIAGNOSIS — Z923 Personal history of irradiation: Secondary | ICD-10-CM | POA: Diagnosis not present

## 2022-09-02 DIAGNOSIS — Z9002 Acquired absence of larynx: Secondary | ICD-10-CM | POA: Diagnosis not present

## 2022-09-02 DIAGNOSIS — Z8521 Personal history of malignant neoplasm of larynx: Secondary | ICD-10-CM | POA: Diagnosis not present

## 2022-10-11 ENCOUNTER — Telehealth: Payer: Self-pay | Admitting: *Deleted

## 2022-10-11 ENCOUNTER — Other Ambulatory Visit: Payer: Self-pay | Admitting: *Deleted

## 2022-10-11 DIAGNOSIS — Z860100 Personal history of colon polyps, unspecified: Secondary | ICD-10-CM

## 2022-10-11 NOTE — Telephone Encounter (Signed)
Katie on DPR called to reschedule because due to their schedule, it will be best to have this on a Friday.  Requesting to change to 11/26/2022. Order was cancel so I had place a new order.  New instructions will be sent. They have already pick up prep solution.

## 2022-10-12 ENCOUNTER — Encounter: Admission: RE | Payer: Self-pay | Source: Home / Self Care

## 2022-10-12 ENCOUNTER — Ambulatory Visit: Admission: RE | Admit: 2022-10-12 | Payer: Medicare HMO | Source: Home / Self Care | Admitting: Gastroenterology

## 2022-10-12 SURGERY — COLONOSCOPY WITH PROPOFOL
Anesthesia: General

## 2022-10-15 ENCOUNTER — Other Ambulatory Visit: Payer: Medicare HMO

## 2022-11-09 ENCOUNTER — Other Ambulatory Visit: Payer: Self-pay | Admitting: Family Medicine

## 2022-11-09 DIAGNOSIS — Z8572 Personal history of non-Hodgkin lymphomas: Secondary | ICD-10-CM

## 2022-11-09 NOTE — Telephone Encounter (Signed)
Requested medications are due for refill today.  yes  Requested medications are on the active medications list.  yes  Last refill. [id number] #[id number] 1 rf  Future visit scheduled.   no  Notes to clinic.  Abnormal labs    Requested Prescriptions  Pending Prescriptions Disp Refills   levothyroxine (SYNTHROID) 112 MCG tablet [Pharmacy Med Name: LEVOTHYROXINE 0.112MG  ( ) TABS] 30 tablet 1    Sig: TAKE 1 TABLET(112 MCG) BY MOUTH DAILY BEFORE BREAKFAST     Endocrinology:  Hypothyroid Agents Failed - 11/09/2022  7:06 AM      Failed - TSH in normal range and within 360 days    TSH  Date Value Ref Range Status  08/20/2022 6.110 (H) 0.450 - 4.500 uIU/mL Final         Passed - Valid encounter within last 12 months    Recent Outpatient Visits           2 months ago Benign essential hypertension   [facility] [place], Megan P, DO   10 months ago Encounter for [employer] annual wellness exam   [facility] [place], [place] P, DO   1 year ago Aphthous ulcer of mouth   [place], Megan P, DO   1 year ago Routine general medical examination at a health care facility   [facility] [place], [place] P, DO   1 year ago Chronic fatigue   [place] [facility] [place], [place], DO

## 2022-11-09 NOTE — Telephone Encounter (Signed)
Patient needs to have lab work before refill. Please call to reschedule lab appointment.

## 2022-11-12 NOTE — Telephone Encounter (Signed)
Called and scheduled patient on 11/15/2022 @ 8:40 for labs.

## 2022-11-15 ENCOUNTER — Other Ambulatory Visit: Payer: Medicare HMO

## 2022-11-15 DIAGNOSIS — E039 Hypothyroidism, unspecified: Secondary | ICD-10-CM

## 2022-11-16 ENCOUNTER — Other Ambulatory Visit: Payer: Self-pay | Admitting: Family Medicine

## 2022-11-16 DIAGNOSIS — Z8572 Personal history of non-Hodgkin lymphomas: Secondary | ICD-10-CM

## 2022-11-16 LAB — TSH: TSH: 1.43 u[IU]/mL (ref 0.450–4.500)

## 2022-11-16 MED ORDER — LEVOTHYROXINE SODIUM 112 MCG PO TABS: 112.0000 ug | ORAL_TABLET | Freq: Every day | ORAL | 3 refills | Status: DC

## 2022-11-22 ENCOUNTER — Telehealth: Payer: Self-pay

## 2022-11-22 NOTE — Telephone Encounter (Signed)
Patient sister is calling to cancel his colonoscopy for 11/26/2022 because he is not going to be in town due to him working. She states that he Is wanting to reschedule the procedure to the end of January on a Friday. Please call back to reschedule

## 2022-11-23 ENCOUNTER — Telehealth: Payer: Self-pay

## 2022-11-23 NOTE — Telephone Encounter (Signed)
Message left for patient's sister to return my call.

## 2022-11-23 NOTE — Telephone Encounter (Signed)
Pt sister Melynda Ripple wants to reschedule pt appointment from 11/26/2022

## 2022-11-23 NOTE — Telephone Encounter (Signed)
Spoken to patient's sister and will call her beginning of the year to reschedule. I have set a reminder for it

## 2022-11-23 NOTE — Telephone Encounter (Addendum)
This is 2nd telephone open message for patient

## 2022-11-26 ENCOUNTER — Ambulatory Visit: Admission: RE | Admit: 2022-11-26 | Payer: Medicare HMO | Source: Home / Self Care | Admitting: Gastroenterology

## 2022-11-26 SURGERY — COLONOSCOPY WITH PROPOFOL
Anesthesia: General

## 2022-12-10 ENCOUNTER — Other Ambulatory Visit: Payer: Self-pay | Admitting: Family Medicine

## 2022-12-10 DIAGNOSIS — Z8572 Personal history of non-Hodgkin lymphomas: Secondary | ICD-10-CM

## 2022-12-14 NOTE — Telephone Encounter (Signed)
Rx request to send 1 month worth of BP med- reordered and was sent to mail order previously. Called and LM on Katie's email (pt sister who is on [place]) to callback to clarify.

## 2022-12-14 NOTE — Telephone Encounter (Signed)
Requested Prescriptions  Pending Prescriptions Disp Refills   levothyroxine (SYNTHROID) 112 MCG tablet [Pharmacy Med Name: LEVOTHYROXINE 0.112MG  ( ) TABS] 30 tablet 10    Sig: TAKE 1 TABLET(112 MCG) BY MOUTH DAILY BEFORE BREAKFAST     Endocrinology:  Hypothyroid Agents Passed - 12/10/2022  7:04 AM      Passed - TSH in normal range and within 360 days    TSH  Date Value Ref Range Status  11/15/2022 1.430 0.450 - 4.500 uIU/mL Final         Passed - Valid encounter within last 12 months    Recent Outpatient Visits           3 months ago Benign essential hypertension   [place] [facility] [place], Megan P, DO   11 months ago Encounter for [employer] annual wellness exam   [place] [facility] [place], [place] P, DO   1 year ago Aphthous ulcer of mouth   [place] [facility] [place], Megan P, DO   1 year ago Routine general medical examination at a health care facility   [facility] [place], [place] P, DO   1 year ago Chronic fatigue   [place] [facility] [place], [place], DO

## 2022-12-23 ENCOUNTER — Telehealth: Payer: Self-pay | Admitting: Family Medicine

## 2022-12-23 NOTE — Telephone Encounter (Signed)
Copied from CRM [id number]. Topic: Medicare AWV >> Dec 23, 2022  3:08 PM Payton Doughty wrote: Reason for CRM: Called LVM 12/23/2022 to schedule Annual Wellness Visit  Luke [name]; Care Guide Ambulatory Clinical Support [place] [facility] Direct Dial: [phone]

## 2022-12-30 DIAGNOSIS — Z9002 Acquired absence of larynx: Secondary | ICD-10-CM | POA: Diagnosis not present

## 2022-12-30 DIAGNOSIS — Z923 Personal history of irradiation: Secondary | ICD-10-CM | POA: Diagnosis not present

## 2022-12-30 DIAGNOSIS — R491 Aphonia: Secondary | ICD-10-CM | POA: Diagnosis not present

## 2022-12-30 DIAGNOSIS — Z8521 Personal history of malignant neoplasm of larynx: Secondary | ICD-10-CM | POA: Diagnosis not present

## 2023-01-13 ENCOUNTER — Telehealth: Payer: Self-pay | Admitting: *Deleted

## 2023-01-13 ENCOUNTER — Other Ambulatory Visit: Payer: Self-pay | Admitting: *Deleted

## 2023-01-13 DIAGNOSIS — Z860100 Personal history of colon polyps, unspecified: Secondary | ICD-10-CM

## 2023-01-13 NOTE — Telephone Encounter (Signed)
 This is reschedule from the cancellation on 11/26/2022.  Requesting to reschedule on 02/18/2023.  New instructions will be sent out. Patient already have prep solution.

## 2023-02-10 DIAGNOSIS — Z23 Encounter for immunization: Secondary | ICD-10-CM | POA: Diagnosis not present

## 2023-02-10 DIAGNOSIS — Z923 Personal history of irradiation: Secondary | ICD-10-CM | POA: Diagnosis not present

## 2023-02-10 DIAGNOSIS — Z79890 Hormone replacement therapy (postmenopausal): Secondary | ICD-10-CM | POA: Diagnosis not present

## 2023-02-10 DIAGNOSIS — F17210 Nicotine dependence, cigarettes, uncomplicated: Secondary | ICD-10-CM | POA: Diagnosis not present

## 2023-02-10 DIAGNOSIS — Z9002 Acquired absence of larynx: Secondary | ICD-10-CM | POA: Diagnosis not present

## 2023-02-10 DIAGNOSIS — Z8521 Personal history of malignant neoplasm of larynx: Secondary | ICD-10-CM | POA: Diagnosis not present

## 2023-02-10 DIAGNOSIS — Z08 Encounter for follow-up examination after completed treatment for malignant neoplasm: Secondary | ICD-10-CM | POA: Diagnosis not present

## 2023-02-10 DIAGNOSIS — Z87891 Personal history of nicotine dependence: Secondary | ICD-10-CM | POA: Diagnosis not present

## 2023-02-10 DIAGNOSIS — Z9221 Personal history of antineoplastic chemotherapy: Secondary | ICD-10-CM | POA: Diagnosis not present

## 2023-02-10 DIAGNOSIS — E038 Other specified hypothyroidism: Secondary | ICD-10-CM | POA: Diagnosis not present

## 2023-02-17 ENCOUNTER — Encounter: Payer: Self-pay | Admitting: Gastroenterology

## 2023-02-18 ENCOUNTER — Ambulatory Visit
Admission: RE | Admit: 2023-02-18 | Discharge: 2023-02-18 | Disposition: A | Discharge status: Home / Self Care | Payer: Medicare HMO | Attending: Gastroenterology | Admitting: Gastroenterology

## 2023-02-18 ENCOUNTER — Encounter
Admission: RE | Disposition: A | Discharge status: Home / Self Care | Payer: Self-pay | Source: Home / Self Care | Attending: Gastroenterology

## 2023-02-18 DIAGNOSIS — Z539 Procedure and treatment not carried out, unspecified reason: Secondary | ICD-10-CM | POA: Insufficient documentation

## 2023-02-18 DIAGNOSIS — Z1211 Encounter for screening for malignant neoplasm of colon: Secondary | ICD-10-CM | POA: Diagnosis not present

## 2023-02-18 DIAGNOSIS — Z860100 Personal history of colon polyps, unspecified: Secondary | ICD-10-CM | POA: Diagnosis not present

## 2023-02-18 HISTORY — PX: COLONOSCOPY WITH PROPOFOL: SHX5780

## 2023-02-18 SURGERY — COLONOSCOPY WITH PROPOFOL
Anesthesia: General

## 2023-02-18 MED ORDER — SODIUM CHLORIDE 0.9 % IV SOLN: INTRAVENOUS | Status: DC

## 2023-02-21 ENCOUNTER — Encounter: Payer: Self-pay | Admitting: Gastroenterology

## 2023-02-25 DIAGNOSIS — H524 Presbyopia: Secondary | ICD-10-CM | POA: Diagnosis not present

## 2023-04-26 ENCOUNTER — Ambulatory Visit (INDEPENDENT_AMBULATORY_CARE_PROVIDER_SITE_OTHER): Admitting: Family Medicine

## 2023-04-26 ENCOUNTER — Encounter: Payer: Self-pay | Admitting: Family Medicine

## 2023-04-26 VITALS — BP 100/63 | HR 67 | Temp 98.4°F | Resp 16 | Ht 72.99 in | Wt 212.8 lb

## 2023-04-26 DIAGNOSIS — J0100 Acute maxillary sinusitis, unspecified: Secondary | ICD-10-CM | POA: Diagnosis not present

## 2023-04-26 MED ORDER — PREDNISONE 10 MG PO TABS: ORAL_TABLET | ORAL | 0 refills | Status: DC

## 2023-04-26 MED ORDER — TRIAMCINOLONE ACETONIDE 40 MG/ML IJ SUSP
40.0000 mg | Freq: Once | INTRAMUSCULAR | Status: AC
  Administered 2023-04-26: 40 mg via INTRAMUSCULAR

## 2023-04-26 MED ORDER — AMOXICILLIN-POT CLAVULANATE 875-125 MG PO TABS
1.0000 | ORAL_TABLET | Freq: Two times a day (BID) | ORAL | 0 refills | Status: DC
Start: 2023-04-26 — End: 2023-05-10

## 2023-04-26 NOTE — Progress Notes (Signed)
 BP 100/63 (BP Location: Left Arm, Patient Position: Sitting, Cuff Size: Normal)   Pulse 67   Temp 98.4 F (36.9 C) (Oral)   Resp 16   Ht 6' 0.99" (1.854 m)   Wt 212 lb 12.8 oz (96.5 kg)   SpO2 98%   BMI 28.08 kg/m    Subjective:    Patient ID: Luke [name], male    DOB: October 21, 1950, 73 y.o.   MRN: [id number]  HPI: Luke [name] is a 73 y.o. male  Chief Complaint  Patient presents with   Sinus Problem    Started 3 weeks. Positive for sinus pressure, congestion, headaches, fevers, coughing, runny nose. Having to use his suction machine over night to help as it is waking him up as it so bad. Claritin is not helping.    UPPER RESPIRATORY TRACT INFECTION Duration: 3 weeks Worst symptom: congestion Fever: yes Cough: yes Shortness of breath: no Wheezing: yes Chest pain: no Chest tightness: no Chest congestion: no Nasal congestion: yes Runny nose: yes Post nasal drip: yes Sneezing: yes Sore throat: yes Swollen glands: no Sinus pressure: no Headache: yes Face pain: no Toothache: no Ear pain: no  Ear pressure: no  Eyes red/itching:yes Eye drainage/crusting: no  Vomiting: no Rash: no Fatigue: no Sick contacts: no Strep contacts: no  Context: stable Recurrent sinusitis: no Relief with OTC cold/cough medications: no  Treatments attempted: cold/sinus, mucinex, and anti-histamine   Relevant past medical, surgical, family and social history reviewed and updated as indicated. Interim medical history since our last visit reviewed. Allergies and medications reviewed and updated.  Review of Systems  Constitutional:  Positive for fatigue and fever. Negative for activity change, appetite change, chills, diaphoresis and unexpected weight change.  HENT:  Positive for congestion, postnasal drip, rhinorrhea and sore throat. Negative for dental problem, drooling, ear discharge, ear pain, facial swelling, hearing loss, mouth sores, nosebleeds, sinus pressure, sinus pain,  sneezing, tinnitus, trouble swallowing and voice change.   Respiratory:  Positive for cough and wheezing. Negative for apnea, choking, chest tightness, shortness of breath and stridor.   Cardiovascular: Negative.   Gastrointestinal: Negative.   Skin: Negative.   Psychiatric/Behavioral: Negative.      Per HPI unless specifically indicated above     Objective:    BP 100/63 (BP Location: Left Arm, Patient Position: Sitting, Cuff Size: Normal)   Pulse 67   Temp 98.4 F (36.9 C) (Oral)   Resp 16   Ht 6' 0.99" (1.854 m)   Wt 212 lb 12.8 oz (96.5 kg)   SpO2 98%   BMI 28.08 kg/m   Wt Readings from Last 3 Encounters:  04/26/23 212 lb 12.8 oz (96.5 kg)  08/20/22 208 lb 9.6 oz (94.6 kg)  01/08/22 207 lb (93.9 kg)    Physical Exam Vitals and nursing note reviewed.  Constitutional:      General: He is not in acute distress.    Appearance: Normal appearance. He is not ill-appearing, toxic-appearing or diaphoretic.  HENT:     Head: Normocephalic and atraumatic.     Comments: + trach    Right Ear: Tympanic membrane, ear canal and external ear normal. There is no impacted cerumen.     Left Ear: Tympanic membrane, ear canal and external ear normal. There is no impacted cerumen.     Nose: Congestion and rhinorrhea present.     Mouth/Throat:     Mouth: Mucous membranes are moist.     Pharynx: Oropharynx is clear. No oropharyngeal exudate  or posterior oropharyngeal erythema.  Eyes:     General: No scleral icterus.       Right eye: No discharge.        Left eye: No discharge.     Extraocular Movements: Extraocular movements intact.     Conjunctiva/sclera: Conjunctivae normal.     Pupils: Pupils are equal, round, and reactive to light.  Cardiovascular:     Rate and Rhythm: Normal rate and regular rhythm.     Pulses: Normal pulses.     Heart sounds: Normal heart sounds. No murmur heard.    No friction rub. No gallop.  Pulmonary:     Effort: Pulmonary effort is normal. No respiratory  distress.     Breath sounds: No stridor. Wheezing present. No rhonchi or rales.  Chest:     Chest wall: No tenderness.  Musculoskeletal:        General: Normal range of motion.     Cervical back: Normal range of motion and neck supple.  Skin:    General: Skin is warm and dry.     Capillary Refill: Capillary refill takes less than 2 seconds.     Coloration: Skin is not jaundiced or pale.     Findings: No bruising, erythema, lesion or rash.  Neurological:     General: No focal deficit present.     Mental Status: He is alert and oriented to person, place, and time. Mental status is at baseline.  Psychiatric:        Mood and Affect: Mood normal.        Behavior: Behavior normal.        Thought Content: Thought content normal.        Judgment: Judgment normal.     Results for orders placed or performed in visit on 11/15/22  TSH   Collection Time: 11/15/22  8:29 AM  Result Value Ref Range   TSH 1.430 0.450 - 4.500 uIU/mL      Assessment & Plan:   Problem List Items Addressed This Visit   None Visit Diagnoses       Acute non-recurrent maxillary sinusitis    -  Primary   Will treat with prednisone and augmentin. Call if not getting better or getting worse. Continue to monitor.   Relevant Medications   triamcinolone acetonide (KENALOG-40) injection 40 mg   amoxicillin-clavulanate (AUGMENTIN) 875-125 MG tablet   predniSONE (DELTASONE) 10 MG tablet        Follow up plan: Return in about 2 weeks (around 05/10/2023).

## 2023-05-10 ENCOUNTER — Ambulatory Visit (INDEPENDENT_AMBULATORY_CARE_PROVIDER_SITE_OTHER): Admitting: Family Medicine

## 2023-05-10 ENCOUNTER — Encounter: Payer: Self-pay | Admitting: Family Medicine

## 2023-05-10 VITALS — BP 98/64 | HR 69

## 2023-05-10 DIAGNOSIS — E7800 Pure hypercholesterolemia, unspecified: Secondary | ICD-10-CM

## 2023-05-10 DIAGNOSIS — Z0000 Encounter for general adult medical examination without abnormal findings: Secondary | ICD-10-CM | POA: Diagnosis not present

## 2023-05-10 DIAGNOSIS — W57XXXA Bitten or stung by nonvenomous insect and other nonvenomous arthropods, initial encounter: Secondary | ICD-10-CM | POA: Diagnosis not present

## 2023-05-10 DIAGNOSIS — J432 Centrilobular emphysema: Secondary | ICD-10-CM

## 2023-05-10 DIAGNOSIS — I10 Essential (primary) hypertension: Secondary | ICD-10-CM | POA: Diagnosis not present

## 2023-05-10 DIAGNOSIS — E038 Other specified hypothyroidism: Secondary | ICD-10-CM

## 2023-05-10 DIAGNOSIS — E559 Vitamin D deficiency, unspecified: Secondary | ICD-10-CM

## 2023-05-10 DIAGNOSIS — K219 Gastro-esophageal reflux disease without esophagitis: Secondary | ICD-10-CM

## 2023-05-10 DIAGNOSIS — R7301 Impaired fasting glucose: Secondary | ICD-10-CM

## 2023-05-10 DIAGNOSIS — N401 Enlarged prostate with lower urinary tract symptoms: Secondary | ICD-10-CM

## 2023-05-10 DIAGNOSIS — R351 Nocturia: Secondary | ICD-10-CM

## 2023-05-10 DIAGNOSIS — Z8572 Personal history of non-Hodgkin lymphomas: Secondary | ICD-10-CM

## 2023-05-10 LAB — MICROALBUMIN, URINE WAIVED
Creatinine, Urine Waived: 300 mg/dL (ref 10–300)
Microalb, Ur Waived: 80 mg/L — ABNORMAL HIGH (ref 0–19)

## 2023-05-10 LAB — BAYER DCA HB A1C WAIVED: HB A1C (BAYER DCA - WAIVED): 6.7 % — ABNORMAL HIGH (ref 4.8–5.6)

## 2023-05-10 MED ORDER — ROSUVASTATIN CALCIUM 5 MG PO TABS: 5.0000 mg | ORAL_TABLET | ORAL | 0 refills | Status: DC

## 2023-05-10 MED ORDER — TADALAFIL 5 MG PO TABS: 5.0000 mg | ORAL_TABLET | Freq: Every day | ORAL | 1 refills | Status: DC

## 2023-05-10 MED ORDER — OMEPRAZOLE 20 MG PO CPDR: 20.0000 mg | DELAYED_RELEASE_CAPSULE | Freq: Every day | ORAL | 3 refills | Status: AC

## 2023-05-10 MED ORDER — TRAZODONE HCL 50 MG PO TABS: 25.0000 mg | ORAL_TABLET | Freq: Every evening | ORAL | 3 refills | Status: DC | PRN

## 2023-05-10 NOTE — Progress Notes (Unsigned)
 BP 98/64   Pulse 69    Subjective:    Patient ID: Luke [name], male    DOB: [birth year], 73 y.o.   MRN: [id number]  HPI: Luke [name] is a 73 y.o. male presenting on 05/10/2023 for comprehensive medical examination. Current medical complaints include:  HYPERTENSION / HYPERLIPIDEMIA Satisfied with current treatment? yes Duration of hypertension: chronic BP monitoring frequency: not checking BP medication side effects: no Past BP meds: none Duration of hyperlipidemia: chronic Cholesterol medication side effects: no Cholesterol supplements: none Past cholesterol medications: crestor  Medication compliance: excellent compliance Aspirin : yes Recent stressors: no Recurrent headaches: no Visual changes: no Palpitations: no Dyspnea: no Chest pain: no Lower extremity edema: no Dizzy/lightheaded: no  Impaired Fasting Glucose HbA1C:  Lab Results  Component Value Date   HGBA1C 6.7 (H) 05/10/2023   Duration of elevated blood sugar: chronic Polydipsia: no Polyuria: no Weight change: no Visual disturbance: no Glucose Monitoring: no Diabetic Education: Not Completed Family history of diabetes: yes  HYPOTHYROIDISM Thyroid  control status:controlled Satisfied with current treatment? yes Medication side effects: yes Medication compliance: excellent compliance Recent dose adjustment:no Fatigue: no Cold intolerance: no Heat intolerance: no Weight gain: no Weight loss: no Constipation: no Diarrhea/loose stools: no Palpitations: no Lower extremity edema: no Anxiety/depressed mood: no  BPH BPH status: exacerbated Satisfied with current treatment?: no Medication side effects: no Medication compliance: excellent compliance Duration: chronic Nocturia: 2-3x per night Urinary frequency:yes Incomplete voiding: yes Urgency: yes Weak urinary stream: yes Straining to start stream: yes Dysuria: no Onset: gradual Severity: mild  He currently lives with: alone Interim  Problems from his last visit: no  Depression Screen done today and results listed below:     05/10/2023    2:40 PM 04/26/2023   11:24 AM 08/20/2022    2:47 PM 01/08/2022    3:36 PM 06/29/2021   10:19 AM  Depression screen PHQ 2/9  Decreased Interest 2 0 0 0 0  Down, Depressed, Hopeless 0 0 0 0 0  PHQ - 2 Score 2 0 0 0 0  Altered sleeping 2 0 0 0 0  Tired, decreased energy 2 0 0 0 0  Change in appetite 0 0 0 0 0  Feeling bad or failure about yourself  0 0 0 0 0  Trouble concentrating 0 0 0 0 0  Moving slowly or fidgety/restless 0 0 0 0 0  Suicidal thoughts 0 0 0 0 0  PHQ-9 Score 6 0 0 0 0  Difficult doing work/chores Not difficult at all Not difficult at all Not difficult at all Not difficult at all Not difficult at all     Past Medical History:  Past Medical History:  Diagnosis Date   Anxiety    Arthritis    Carcinoma metastatic to lymph node (HCC)    Cataract    right   COPD (chronic obstructive pulmonary disease) (HCC)    Depression    GERD (gastroesophageal reflux disease)    Hoarseness of voice    Personal history of tobacco use, presenting hazards to health February of 2001   Thyroid  disease     Surgical History:  Past Surgical History:  Procedure Laterality Date   BALLOON DILATION N/A 01/30/2018   Procedure: BALLOON DILATION;  Surgeon: Sergio Dandy, MD;  Location: [facility] ENDOSCOPY;  Service: Endoscopy;  Laterality: N/A;   COLONOSCOPY     COLONOSCOPY WITH PROPOFOL  N/A 11/29/2016   Procedure: COLONOSCOPY WITH PROPOFOL ;  Surgeon: Cassie Click, MD;  Location: [facility]  ENDOSCOPY;  Service: Endoscopy;  Laterality: N/A;   COLONOSCOPY WITH PROPOFOL  N/A 08/22/2017   Procedure: COLONOSCOPY WITH PROPOFOL ;  Surgeon: Luke Salaam, MD;  Location: [facility] ENDOSCOPY;  Service: Gastroenterology;  Laterality: N/A;   COLONOSCOPY WITH PROPOFOL  N/A 02/18/2023   Procedure: COLONOSCOPY WITH PROPOFOL ;  Surgeon: Luke Salaam, MD;  Location: [facility] ENDOSCOPY;  Service: Gastroenterology;   Laterality: N/A;  Maryl Snook -  CARETAKER) [phone]   ESOPHAGEAL MANOMETRY N/A 10/05/2017   Procedure: ESOPHAGEAL MANOMETRY (EM);  Surgeon: Luke Salaam, MD;  Location: [facility] ENDOSCOPY;  Service: Gastroenterology;  Laterality: N/A;   ESOPHAGEAL MANOMETRY N/A 01/30/2018   Procedure: ESOPHAGEAL MANOMETRY (EM);  Surgeon: Sergio Dandy, MD;  Location: [facility] ENDOSCOPY;  Service: Endoscopy;  Laterality: N/A;   ESOPHAGOGASTRODUODENOSCOPY N/A 03/09/2018   Procedure: ESOPHAGOGASTRODUODENOSCOPY (EGD);  Surgeon: Janel Medford, MD;  Location: [facility] ENDOSCOPY;  Service: Endoscopy;  Laterality: N/A;   ESOPHAGOGASTRODUODENOSCOPY (EGD) WITH PROPOFOL  N/A 11/29/2016   Procedure: ESOPHAGOGASTRODUODENOSCOPY (EGD) WITH PROPOFOL ;  Surgeon: Cassie Click, MD;  Location[facility] ENDOSCOPY;  Service: Endoscopy;  Laterality: N/A;   ESOPHAGOGASTRODUODENOSCOPY (EGD) WITH PROPOFOL  N/A 08/22/2017   Procedure: ESOPHAGOGASTRODUODENOSCOPY (EGD) WITH PROPOFOL ;  Surgeon: Luke Salaam, MD;  Location: [facility] ENDOSCOPY;  Service: Gastroenterology;  Laterality: N/A;   ESOPHAGOGASTRODUODENOSCOPY (EGD) WITH PROPOFOL  N/A 01/30/2018   Procedure: ESOPHAGOGASTRODUODENOSCOPY (EGD) WITH PROPOFOL ;  Surgeon: Sergio Dandy, MD;  Location: [facility] ENDOSCOPY;  Service: Endoscopy;  Laterality: N/A;  Mano probe to be placed during EGD   EUS N/A 03/09/2018   Procedure: UPPER ENDOSCOPIC ULTRASOUND (EUS) RADIAL;  Surgeon: Janel Medford, MD;  Location: [facility] ENDOSCOPY;  Service: Endoscopy;  Laterality: N/A;   NECK SURGERY     status post diagnosis of cancer with chemo and radiation   SUBMUCOSAL INJECTION  03/09/2018   Procedure: SUBMUCOSAL INJECTION;  Surgeon: Janel Medford, MD;  Location: [facility] ENDOSCOPY;  Service: Endoscopy;;   TONSILLECTOMY     TOTAL LARYNGECTOMY  01/31/2020    Medications:  Current Outpatient Medications on File Prior to Visit  Medication Sig   aspirin  EC 81 MG tablet Take 1 tablet (81 mg total) by mouth daily.  Swallow whole.   No current facility-administered medications on file prior to visit.    Allergies:  No Known Allergies  Social History:  Social History   Socioeconomic History   Marital status: Single    Spouse name: Not on file   Number of children: 1   Years of education: High School   Highest education level: High school graduate  Occupational History   Not on file  Tobacco Use   Smoking status: Former    Types: Cigarettes    Start date: 1970   Smokeless tobacco: Former   Tobacco comments:    05/03/19- down to half a pack or less / day  Vaping Use   Vaping status: Never Used  Substance and Sexual Activity   Alcohol use: No   Drug use: No   Sexual activity: Not Currently  Other Topics Concern   Not on file  Social History Narrative   Not on file   Social Drivers of Health   Financial Resource Strain: Low Risk  (05/10/2023)   Overall Financial Resource Strain (CARDIA)    Difficulty of Paying Living Expenses: Not hard at all  Food Insecurity: No Food Insecurity (05/10/2023)   Hunger Vital Sign    Worried About Running Out of Food in the Last Year: Never true    Ran Out of Food in the  Last Year: Never true  Transportation Needs: No Transportation Needs (05/10/2023)   PRAPARE - Administrator, Civil Service (Medical): No    Lack of Transportation (Non-Medical): No  Physical Activity: Insufficiently Active (05/10/2023)   Exercise Vital Sign    Days of Exercise per Week: 4 days    Minutes of Exercise per Session: 30 min  Stress: No Stress Concern Present (05/10/2023)   [employer] of Occupational Health - Occupational Stress Questionnaire    Feeling of Stress : Not at all  Social Connections: Moderately Isolated (05/10/2023)   Social Connection and Isolation Panel [NHANES]    Frequency of Communication with Friends and Family: More than three times a week    Frequency of Social Gatherings with Friends and Family: More than three times a week     Attends Religious Services: Never    Database administrator or Organizations: No    Attends Banker Meetings: Never    Marital Status: Married  Catering manager Violence: Not At Risk (05/10/2023)   Humiliation, Afraid, Rape, and Kick questionnaire    Fear of Current or Ex-Partner: No    Emotionally Abused: No    Physically Abused: No    Sexually Abused: No   Social History   Tobacco Use  Smoking Status Former   Types: Cigarettes   Start date: 1970  Smokeless Tobacco Former  Tobacco Comments   05/03/19- down to half a pack or less / day   Social History   Substance and Sexual Activity  Alcohol Use No    Family History:  Family History  Problem Relation Age of Onset   Breast cancer Mother    Heart disease Mother    Cancer Mother        breast, back, bladder   Heart attack Father    Heart disease Father     Past medical history, surgical history, medications, allergies, family history and social history reviewed with patient today and changes made to appropriate areas of the chart.   Review of Systems  Constitutional: Negative.   HENT:  Positive for congestion. Negative for ear discharge, ear pain, hearing loss, nosebleeds, sinus pain, sore throat and tinnitus.   Eyes: Negative.        Unable to see out of L eye for about 15-20 minutes about 10 AM  Respiratory:  Positive for cough. Negative for hemoptysis, sputum production, shortness of breath, wheezing and stridor.   Cardiovascular: Negative.   Gastrointestinal:  Positive for heartburn. Negative for abdominal pain, blood in stool, constipation, diarrhea, melena, nausea and vomiting.  Genitourinary: Negative.        Nocturia x2  Musculoskeletal:  Positive for myalgias (in chest and legs). Negative for back pain, falls, joint pain and neck pain.  Skin: Negative.   Neurological:  Positive for dizziness. Negative for tingling, tremors, sensory change, speech change, focal weakness, seizures, loss of  consciousness, weakness and headaches.  Endo/Heme/Allergies:  Negative for environmental allergies and polydipsia. Bruises/bleeds easily.  Psychiatric/Behavioral:  Negative for depression, hallucinations, memory loss, substance abuse and suicidal ideas. The patient has insomnia. The patient is not nervous/anxious.    All other ROS negative except what is listed above and in the HPI.      Objective:    BP 98/64   Pulse 69   Wt Readings from Last 3 Encounters:  04/26/23 212 lb 12.8 oz (96.5 kg)  08/20/22 208 lb 9.6 oz (94.6 kg)  01/08/22 207 lb (93.9 kg)  Physical Exam Vitals and nursing note reviewed.  Constitutional:      General: He is not in acute distress.    Appearance: Normal appearance. He is not ill-appearing, toxic-appearing or diaphoretic.  HENT:     Head: Normocephalic and atraumatic.     Right Ear: Tympanic membrane, ear canal and external ear normal. There is no impacted cerumen.     Left Ear: Tympanic membrane, ear canal and external ear normal. There is no impacted cerumen.     Nose: Nose normal. No congestion or rhinorrhea.     Mouth/Throat:     Mouth: Mucous membranes are moist.     Pharynx: Oropharynx is clear. No oropharyngeal exudate or posterior oropharyngeal erythema.  Eyes:     General: No scleral icterus.       Right eye: No discharge.        Left eye: No discharge.     Extraocular Movements: Extraocular movements intact.     Conjunctiva/sclera: Conjunctivae normal.     Pupils: Pupils are equal, round, and reactive to light.  Neck:     Vascular: No carotid bruit.  Cardiovascular:     Rate and Rhythm: Normal rate and regular rhythm.     Pulses: Normal pulses.     Heart sounds: No murmur heard.    No friction rub. No gallop.  Pulmonary:     Effort: Pulmonary effort is normal. No respiratory distress.     Breath sounds: Normal breath sounds. No stridor. No wheezing, rhonchi or rales.  Chest:     Chest wall: No tenderness.  Abdominal:     General:  Abdomen is flat. Bowel sounds are normal. There is no distension.     Palpations: Abdomen is soft. There is no mass.     Tenderness: There is no abdominal tenderness. There is no right CVA tenderness, left CVA tenderness, guarding or rebound.     Hernia: No hernia is present.  Genitourinary:    Comments: Genital exam deferred with shared decision making Musculoskeletal:        General: No swelling, tenderness, deformity or signs of injury.     Cervical back: Normal range of motion and neck supple. No rigidity. No muscular tenderness.     Right lower leg: No edema.     Left lower leg: No edema.  Lymphadenopathy:     Cervical: No cervical adenopathy.  Skin:    General: Skin is warm and dry.     Capillary Refill: Capillary refill takes less than 2 seconds.     Coloration: Skin is not jaundiced or pale.     Findings: No bruising, erythema, lesion or rash.  Neurological:     General: No focal deficit present.     Mental Status: He is alert and oriented to person, place, and time.     Cranial Nerves: No cranial nerve deficit.     Sensory: No sensory deficit.     Motor: No weakness.     Coordination: Coordination normal.     Gait: Gait normal.     Deep Tendon Reflexes: Reflexes normal.  Psychiatric:        Mood and Affect: Mood normal.        Behavior: Behavior normal.        Thought Content: Thought content normal.        Judgment: Judgment normal.     Results for orders placed or performed in visit on 05/10/23  Bayer DCA Hb A1c Waived   Collection Time: 05/10/23  3:01 PM  Result Value Ref Range   HB A1C (BAYER DCA - WAIVED) 6.7 (H) 4.8 - 5.6 %  Microalbumin, Urine Waived   Collection Time: 05/10/23  3:01 PM  Result Value Ref Range   Microalb, Ur Waived 80 (H) 0 - 19 mg/L   Creatinine, Urine Waived 300 10 - 300 mg/dL   Microalb/Creat Ratio 30-300 (H) <30 mg/g  Comprehensive metabolic panel with GFR   Collection Time: 05/10/23  3:02 PM  Result Value Ref Range   Glucose 133  (H) 70 - 99 mg/dL   BUN 30 (H) 8 - 27 mg/dL   Creatinine, Ser 1.61 (H) 0.76 - 1.27 mg/dL   eGFR 59 (L) >09 UE/AVW/0.98   BUN/Creatinine Ratio 23 10 - 24   Sodium 142 134 - 144 mmol/L   Potassium 4.6 3.5 - 5.2 mmol/L   Chloride 105 96 - 106 mmol/L   CO2 20 20 - 29 mmol/L   Calcium  9.4 8.6 - 10.2 mg/dL   Total Protein 6.4 6.0 - 8.5 g/dL   Albumin 4.4 3.9 - 4.9 g/dL   Globulin, Total 2.0 1.5 - 4.5 g/dL   Bilirubin Total 0.3 0.0 - 1.2 mg/dL   Alkaline Phosphatase 106 44 - 121 IU/L   AST 38 0 - 40 IU/L   ALT 27 0 - 44 IU/L  CBC with Differential/Platelet   Collection Time: 05/10/23  3:02 PM  Result Value Ref Range   WBC 10.0 3.4 - 10.8 x10E3/uL   RBC 4.39 4.14 - 5.80 x10E6/uL   Hemoglobin 13.6 13.0 - 17.7 g/dL   Hematocrit 11.9 14.7 - 51.0 %   MCV 92 79 - 97 fL   MCH 31.0 26.6 - 33.0 pg   MCHC 33.7 31.5 - 35.7 g/dL   RDW 82.9 56.2 - 13.0 %   Platelets 244 150 - 450 x10E3/uL   Neutrophils 82 Not Estab. %   Lymphs 11 Not Estab. %   Monocytes 6 Not Estab. %   Eos 1 Not Estab. %   Basos 0 Not Estab. %   Neutrophils Absolute 8.0 (H) 1.4 - 7.0 x10E3/uL   Lymphocytes Absolute 1.1 0.7 - 3.1 x10E3/uL   Monocytes Absolute 0.6 0.1 - 0.9 x10E3/uL   EOS (ABSOLUTE) 0.1 0.0 - 0.4 x10E3/uL   Basophils Absolute 0.0 0.0 - 0.2 x10E3/uL   Immature Granulocytes 0 Not Estab. %   Immature Grans (Abs) 0.0 0.0 - 0.1 x10E3/uL  Lipid Panel w/o Chol/HDL Ratio   Collection Time: 05/10/23  3:02 PM  Result Value Ref Range   Cholesterol, Total 188 100 - 199 mg/dL   Triglycerides 865 (H) 0 - 149 mg/dL   HDL 35 (L) >78 mg/dL   VLDL Cholesterol Cal 35 5 - 40 mg/dL   LDL Chol Calc (NIH) 469 (H) 0 - 99 mg/dL  PSA   Collection Time: 05/10/23  3:02 PM  Result Value Ref Range   Prostate Specific Ag, Serum 0.6 0.0 - 4.0 ng/mL  TSH   Collection Time: 05/10/23  3:02 PM  Result Value Ref Range   TSH 1.330 0.450 - 4.500 uIU/mL  Lyme Disease Serology w/Reflex   Collection Time: 05/10/23  3:02 PM  Result  Value Ref Range   Lyme Total Antibody EIA WILL FOLLOW   Spotted Fever Group Antibodies   Collection Time: 05/10/23  3:02 PM  Result Value Ref Range   Spotted Fever Group IgG WILL FOLLOW    Spotted Fever Group IgM WILL FOLLOW    Result  Comment Comment   VITAMIN D  25 Hydroxy (Vit-D Deficiency, Fractures)   Collection Time: 05/10/23  3:02 PM  Result Value Ref Range   Vit D, 25-Hydroxy 37.0 30.0 - 100.0 ng/mL      Assessment & Plan:   Problem List Items Addressed This Visit       Cardiovascular and Mediastinum   Benign essential hypertension   Off medicine. BP running low. Increase fluids. Call with any concerns.       Relevant Medications   rosuvastatin  (CRESTOR ) 5 MG tablet (Start on 05/12/2023)   tadalafil (CIALIS) 5 MG tablet   Other Relevant Orders   Comprehensive metabolic panel with GFR (Completed)   CBC with Differential/Platelet (Completed)   TSH (Completed)   Microalbumin, Urine Waived (Completed)     Respiratory   Centrilobular emphysema (HCC)   Under good control on current regimen. Continue current regimen. Continue to monitor. Call with any concerns. Refills given.          Digestive   Gastroesophageal reflux disease   Under good control on current regimen. Continue current regimen. Continue to monitor. Call with any concerns. Refills given. Labs drawn today.        Relevant Medications   omeprazole  (PRILOSEC) 20 MG capsule     Endocrine   Secondary hypothyroidism   Rechecking labs today. Await results. Treat as needed.       Relevant Medications   levothyroxine  (SYNTHROID ) 112 MCG tablet   Other Relevant Orders   Comprehensive metabolic panel with GFR (Completed)   CBC with Differential/Platelet (Completed)   IFG (impaired fasting glucose)   Rechecking labs today. Await results. Treat as needed.       Relevant Orders   Comprehensive metabolic panel with GFR (Completed)   CBC with Differential/Platelet (Completed)   Bayer DCA Hb A1c Waived  (Completed)     Other   H/O lymphoma   Continue to follow with oncology. Call with any concerns. Continue to monitor.       Relevant Medications   levothyroxine  (SYNTHROID ) 112 MCG tablet   Pure hypercholesterolemia   Under good control on current regimen. Continue current regimen. Continue to monitor. Call with any concerns. Refills given. Labs drawn today.       Relevant Medications   rosuvastatin  (CRESTOR ) 5 MG tablet (Start on 05/12/2023)   tadalafil (CIALIS) 5 MG tablet   Other Relevant Orders   Comprehensive metabolic panel with GFR (Completed)   CBC with Differential/Platelet (Completed)   Lipid Panel w/o Chol/HDL Ratio (Completed)   Vitamin D  deficiency   Rechecking labs today. Await results. Treat as needed.       Relevant Orders   Comprehensive metabolic panel with GFR (Completed)   CBC with Differential/Platelet (Completed)   VITAMIN D  25 Hydroxy (Vit-D Deficiency, Fractures) (Completed)   Benign prostatic hyperplasia with nocturia   Not doing well. Given low BP will start cialis. Recheck 3 months. Call with any concerns.       Relevant Orders   Comprehensive metabolic panel with GFR (Completed)   CBC with Differential/Platelet (Completed)   PSA (Completed)   Other Visit Diagnoses       Encounter for Medicare annual wellness exam    -  Primary   Preventative care discussed today as below.     Routine general medical examination at a health care facility       Vaccines up to date. Screening labs checked today. Colon cancer screening up to date. Continue diet and exercise. Call with  any concerns.     Tick bite, unspecified site, initial encounter       Will check for tick diseases. Await results.   Relevant Orders   Comprehensive metabolic panel with GFR (Completed)   CBC with Differential/Platelet (Completed)   Lyme Disease Serology w/Reflex (Completed)   Spotted Fever Group Antibodies (Completed)        LABORATORY TESTING:  Health maintenance labs  ordered today as discussed above.   The natural history of prostate cancer and ongoing controversy regarding screening and potential treatment outcomes of prostate cancer has been discussed with the patient. The meaning of a false positive PSA and a false negative PSA has been discussed. He indicates understanding of the limitations of this screening test and wishes to proceed with screening PSA testing.   IMMUNIZATIONS:   - Tdap: Tetanus vaccination status reviewed: last tetanus booster within 10 years. - Influenza: Up to date - Pneumovax: Up to date - Prevnar: Up to date - COVID: Refused - HPV: Not applicable - Shingrix vaccine: Refused  SCREENING: - Colonoscopy: Up to date  Discussed with patient purpose of the colonoscopy is to detect colon cancer at curable precancerous or early stages   PATIENT COUNSELING:    Sexuality: Discussed sexually transmitted diseases, partner selection, use of condoms, avoidance of unintended pregnancy  and contraceptive alternatives.   Advised to avoid cigarette smoking.  I discussed with the patient that most people either abstain from alcohol or drink within safe limits (<=14/week and <=4 drinks/occasion for males, <=7/weeks and <= 3 drinks/occasion for females) and that the risk for alcohol disorders and other health effects rises proportionally with the number of drinks per week and how often a drinker exceeds daily limits.  Discussed cessation/primary prevention of drug use and availability of treatment for abuse.   Diet: Encouraged to adjust caloric intake to maintain  or achieve ideal body weight, to reduce intake of dietary saturated fat and total fat, to limit sodium intake by avoiding high sodium foods and not adding table salt, and to maintain adequate dietary potassium and calcium  preferably from fresh fruits, vegetables, and low-fat dairy products.    stressed the importance of regular exercise  Injury prevention: Discussed safety belts,  safety helmets, smoke detector, smoking near bedding or upholstery.   Dental health: Discussed importance of regular tooth brushing, flossing, and dental visits.   Follow up plan: NEXT PREVENTATIVE PHYSICAL DUE IN 1 YEAR. Return in about 6 months (around 11/09/2023).

## 2023-05-10 NOTE — Progress Notes (Unsigned)
 Subjective:   Luke [name] is a 73 y.o. male who presents for Medicare Annual/Subsequent preventive examination.  Visit Complete: In person  Patient Medicare AWV questionnaire was completed by the patient on 05/10/23; I have confirmed that all information answered by patient is correct and no changes since this date.  Cardiac Risk Factors include: advanced age (>24men, >73 women)     Objective:    Today's Vitals   05/10/23 1431 05/10/23 1443  BP: (!) 89/63 98/64  Pulse: 69   PainSc:  0-No pain   There is no height or weight on file to calculate BMI.     12/15/2020   12:52 PM 12/14/2019   11:22 AM 03/21/2019   12:30 PM 03/09/2018    7:36 AM 01/30/2018   10:34 AM 10/17/2017    3:48 PM 08/22/2017    9:06 AM  Advanced Directives  Does Patient Have a Medical Advance Directive? Yes No No No No No No  Type of Media planner of Healthcare Power of Attorney in Chart? No - copy requested        Would patient like information on creating a medical advance directive?   No - Patient declined No - Patient declined No - Patient declined  No - Patient declined    Current Medications (verified) Outpatient Encounter Medications as of 05/10/2023  Medication Sig   aspirin  EC 81 MG tablet Take 1 tablet (81 mg total) by mouth daily. Swallow whole.   levothyroxine  (SYNTHROID ) 112 MCG tablet TAKE 1 TABLET(112 MCG) BY MOUTH DAILY BEFORE BREAKFAST   tadalafil (CIALIS) 5 MG tablet Take 1 tablet (5 mg total) by mouth daily.   [DISCONTINUED] omeprazole  (PRILOSEC) 20 MG capsule Take 1 capsule (20 mg total) by mouth daily.   [DISCONTINUED] rosuvastatin  (CRESTOR ) 5 MG tablet Take 1 tablet (5 mg total) by mouth 2 (two) times a week.   omeprazole  (PRILOSEC) 20 MG capsule Take 1 capsule (20 mg total) by mouth daily.   [START ON 05/12/2023] rosuvastatin  (CRESTOR ) 5 MG tablet Take 1 tablet (5 mg total) by mouth 2 (two) times a week.   [DISCONTINUED]  amoxicillin -clavulanate (AUGMENTIN ) 875-125 MG tablet Take 1 tablet by mouth 2 (two) times daily.   [DISCONTINUED] predniSONE  (DELTASONE ) 10 MG tablet Take 6 pills day 1, 5 pills day 2, decrease by 1 daily until gone.   [DISCONTINUED] sildenafil  (VIAGRA ) 100 MG tablet Take 0.5-1 tablets (50-100 mg total) by mouth daily as needed for erectile dysfunction.   No facility-administered encounter medications on file as of 05/10/2023.    Allergies (verified) Patient has no known allergies.   History: Past Medical History:  Diagnosis Date   Anxiety    Arthritis    Carcinoma metastatic to lymph node (HCC)    Cataract    right   COPD (chronic obstructive pulmonary disease) (HCC)    Depression    GERD (gastroesophageal reflux disease)    Hoarseness of voice    Personal history of tobacco use, presenting hazards to health February of 2001   Thyroid  disease    Past Surgical History:  Procedure Laterality Date   BALLOON DILATION N/A 01/30/2018   Procedure: BALLOON DILATION;  Surgeon: Sergio Dandy, MD;  Location: [facility] ENDOSCOPY;  Service: Endoscopy;  Laterality: N/A;   COLONOSCOPY     COLONOSCOPY WITH PROPOFOL  N/A 11/29/2016   Procedure: COLONOSCOPY WITH PROPOFOL ;  Surgeon: Cassie Click, MD;  Location: [facility] ENDOSCOPY;  Service:  Endoscopy;  Laterality: N/A;   COLONOSCOPY WITH PROPOFOL  N/A 08/22/2017   Procedure: COLONOSCOPY WITH PROPOFOL ;  Surgeon: Luke Salaam, MD;  Location: [facility] ENDOSCOPY;  Service: Gastroenterology;  Laterality: N/A;   COLONOSCOPY WITH PROPOFOL  N/A 02/18/2023   Procedure: COLONOSCOPY WITH PROPOFOL ;  Surgeon: Luke Salaam, MD;  Location: [facility] ENDOSCOPY;  Service: Gastroenterology;  Laterality: N/A;  Maryl Snook -  CARETAKER) [phone]   ESOPHAGEAL MANOMETRY N/A 10/05/2017   Procedure: ESOPHAGEAL MANOMETRY (EM);  Surgeon: Luke Salaam, MD;  Location: [facility] END[facility]COPY;  Service: Gastroenterology;  Laterality: N/A;   ESOPHAGEAL MANOMETRY N/A 01/30/2018    Procedure: ESOPHAGEAL MANOMETRY (EM);  Surgeon: Sergio Dandy, MD;  Location: [facility] ENDOSCOPY;  Service: Endoscopy;  Laterality: N/A;   ESOPHAGOGASTRODUODENOSCOPY N/A 03/09/2018   Procedure: ESOPHAGOGASTRODUODENOSCOPY (EGD);  Surgeon: Janel Medford, MD;  Location: [facility] ENDOSCOPY;  Service: Endoscopy;  Laterality: N/A;   ESOPHAGOGASTRODUODENOSCOPY (EGD) WITH PROPOFOL  N/A 11/29/2016   Procedure: ESOPHAGOGASTRODUODENOSCOPY (EGD) WITH PROPOFOL ;  Surgeon: Cassie Click, MD;  Location[facility] END[facility]COPY;  Service: Endoscopy;  Laterality: N/A;   ESOPHAGOGASTRODUODENOSCOPY (EGD) WITH PROPOFOL  N/A 08/22/2017   Procedure: ESOPHAGOGASTRODUODENOSCOPY (EGD) WITH PROPOFOL ;  Surgeon: Luke Salaam, MD;  Location: [facility] END[facility]COPY;  Service: Gastroenterology;  Laterality: N/A;   ESOPHAGOGASTRODUODENOSCOPY (EGD) WITH PROPOFOL  N/A 01/30/2018   Procedure: ESOPHAGOGASTRODUODENOSCOPY (EGD) WITH PROPOFOL ;  Surgeon: Sergio Dandy, MD;  Location: [facility] ENDOSCOPY;  Service: Endoscopy;  Laterality: N/A;  Mano probe to be placed during EGD   EUS N/A 03/09/2018   Procedure: UPPER ENDOSCOPIC ULTRASOUND (EUS) RADIAL;  Surgeon: Janel Medford, MD;  Location: [facility] ENDOSCOPY;  Service: Endoscopy;  Laterality: N/A;   NECK SURGERY     status post diagnosis of cancer with chemo and radiation   SUBMUCOSAL INJECTION  03/09/2018   Procedure: SUBMUCOSAL INJECTION;  Surgeon: Janel Medford, MD;  Location: [facility] ENDOSCOPY;  Service: Endoscopy;;   TONSILLECTOMY     TOTAL LARYNGECTOMY  01/31/2020   Family History  Problem Relation Age of Onset   Breast cancer Mother    Heart disease Mother    Cancer Mother        breast, back, bladder   Heart attack Father    Heart disease Father    Social History   Socioeconomic History   Marital status: Single    Spouse name: Not on file   Number of children: 1   Years of education: High School   Highest education level: High school graduate  Occupational History   Not on file  Tobacco Use    Smoking status: Former    Types: Cigarettes    Start date: 1970   Smokeless tobacco: Former   Tobacco comments:    05/03/19- down to half a pack or less / day  Vaping Use   Vaping status: Never Used  Substance and Sexual Activity   Alcohol use: No   Drug use: No   Sexual activity: Not Currently  Other Topics Concern   Not on file  Social History Narrative   Not on file   Social Drivers of Health   Financial Resource Strain: Low Risk  (05/10/2023)   Overall Financial Resource Strain (CARDIA)    Difficulty of Paying Living Expenses: Not hard at all  Food Insecurity: No Food Insecurity (05/10/2023)   Hunger Vital Sign    Worried About Running Out of Food in the Last Year: Never true    Ran Out of Food in the Last Year: Never true  Transportation Needs: No Transportation Needs (  05/10/2023)   PRAPARE - Administrator, Civil Service (Medical): No    Lack of Transportation (Non-Medical): No  Physical Activity: Insufficiently Active (05/10/2023)   Exercise Vital Sign    Days of Exercise per Week: 4 days    Minutes of Exercise per Session: 30 min  Stress: No Stress Concern Present (05/10/2023)   [employer] of Occupational Health - Occupational Stress Questionnaire    Feeling of Stress : Not at all  Social Connections: Moderately Isolated (05/10/2023)   Social Connection and Isolation Panel [NHANES]    Frequency of Communication with Friends and Family: More than three times a week    Frequency of Social Gatherings with Friends and Family: More than three times a week    Attends Religious Services: Never    Database administrator or Organizations: No    Attends Banker Meetings: Never    Marital Status: Married    Tobacco Counseling Counseling given: Not Answered Tobacco comments: 05/03/19- down to half a pack or less / day   Clinical Intake:     Pain : No/denies pain Pain Score: 0-No pain     Diabetes: No  How often do you need to have  someone help you when you read instructions, pamphlets, or other written materials from your doctor or pharmacy?: 1 - Never  Interpreter Needed?: No      Activities of Daily Living    05/10/2023    2:36 PM  In your present state of health, do you have any difficulty performing the following activities:  Hearing? 0  Vision? 1  Difficulty concentrating or making decisions? 0  Walking or climbing stairs? 0  Dressing or bathing? 0  Doing errands, shopping? 0  Preparing Food and eating ? N  Using the Toilet? N  In the past six months, have you accidently leaked urine? N  Do you have problems with loss of bowel control? N  Managing your Medications? Y  Managing your Finances? Y  Housekeeping or managing your Housekeeping? Y    Patient Care Team: Solomon Dupre, DO as PCP - General (Family Medicine) Constancia Delton, MD as PCP - Cardiology (Cardiology) Howell Macintosh, [facility] (Inactive) (Pharmacist)  Indicate any recent Medical Services you may have received from other than [facility] providers in the past year (date may be approximate).     Assessment:   This is a routine wellness examination for Luke [name].  Hearing/Vision screen No results found.   Goals Addressed   None   Depression Screen    05/10/2023    2:40 PM 04/26/2023   11:24 AM 08/20/2022    2:47 PM 01/08/2022    3:36 PM 06/29/2021   10:19 AM 06/01/2021   10:17 AM 12/30/2020   11:04 AM  PHQ 2/9 Scores  PHQ - 2 Score 2 0 0 0 0 0 0  PHQ- 9 Score 6 0 0 0 0 0 0    Fall Risk    05/10/2023    2:36 PM 04/26/2023   11:24 AM 08/20/2022    2:47 PM 01/08/2022    3:36 PM 06/29/2021   10:20 AM  Fall Risk   Falls in the past year? 0 0 0 0 0  Number falls in past yr: 0 0 0 0 0  Injury with Fall? 0 0 0 0 0  Risk for fall due to :  No Fall Risks No Fall Risks No Fall Risks No Fall Risks  Follow up  Falls  evaluation completed Falls evaluation completed Falls evaluation completed Falls evaluation completed    MEDICARE RISK AT  HOME: Medicare Risk at Home Any stairs in or around the home?: No If so, are there any without handrails?: No Home free of loose throw rugs in walkways, pet beds, electrical cords, etc?: Yes Adequate lighting in your home to reduce risk of falls?: Yes Life alert?: No Use of a cane, walker or w/c?: No Grab bars in the bathroom?: No Shower chair or bench in shower?: No Elevated toilet seat or a handicapped toilet?: No  TIMED UP AND GO:  Was the test performed?  Yes  Length of time to ambulate 10 feet: 10 sec Gait steady and fast without use of assistive device    Cognitive Function:        05/10/2023    3:08 PM 05/10/2023    2:41 PM 05/10/2023    2:38 PM 01/08/2022    3:53 PM 12/15/2020   12:55 PM  6CIT Screen  What Year? 0 points  0 points 0 points 0 points  What month? 0 points  0 points 0 points 0 points  What time? 0 points  0 points 0 points 0 points  Count back from 20 0 points   0 points 0 points  Months in reverse 0 points   4 points 0 points  Repeat phrase 6 points 6 points  6 points 0 points  Total Score 6 points   10 points 0 points    Immunizations Immunization History  Administered Date(s) Administered   Fluad Quad(high Dose 65+) 10/05/2019, 12/16/2020, 01/08/2022   Influenza Inj Mdck Quad With Preservative 10/21/2016   Influenza,inj,Quad PF,6+ Mos 10/09/2015   Influenza-Unspecified 11/04/2017, 10/17/2019   Moderna Sars-Covid-2 Vaccination 05/30/2019   PNEUMOCOCCAL CONJUGATE-20 08/20/2022   Pneumococcal Conjugate-13 02/16/2019   Pneumococcal Polysaccharide-23 07/02/2017   Td 01/08/2022   Zoster, Live 09/05/2013    TDAP status: Up to date  Flu Vaccine status: Up to date  Pneumococcal vaccine status: Up to date  Covid-19 vaccine status: Declined, Education has been provided regarding the importance of this vaccine but patient still declined. Advised may receive this vaccine at local pharmacy or Health Dept.or vaccine clinic. Aware to provide a copy of  the vaccination record if obtained from local pharmacy or Health Dept. Verbalized acceptance and understanding.  Qualifies for Shingles Vaccine? Yes   Zostavax completed No     Screening Tests Health Maintenance  Topic Date Due   Lung Cancer Screening  06/12/2020   Zoster Vaccines- Shingrix (1 of 2) 07/26/2023 (Originally 08/08/1969)   INFLUENZA VACCINE  08/12/2023   Medicare Annual Wellness (AWV)  05/09/2024   Colonoscopy  02/18/2028   DTaP/Tdap/Td (2 - Tdap) 01/09/2032   Pneumonia Vaccine 54+ Years old  Completed   Hepatitis C Screening  Completed   HPV VACCINES  Aged Out   Meningococcal B Vaccine  Aged Out   COVID-19 Vaccine  Discontinued    Health Maintenance  Health Maintenance Due  Topic Date Due   Lung Cancer Screening  06/12/2020    Colorectal cancer screening: Type of screening: Colonoscopy. Completed 02/18/23. Repeat every 5 years  Lung Cancer Screening: (Low Dose CT Chest recommended if Age 75-80 years, 20 pack-year currently smoking OR have quit w/in 15years.) does qualify.   Lung Cancer Screening Referral: through oncology  Additional Screening:  Hepatitis C Screening: does qualify; Completed 12/16/20  Dental Screening: Recommended annual dental exams for proper oral hygiene   Community Resource Referral /  Chronic Care Management: CRR required this visit?  No   CCM required this visit?  No     Plan:     I have personally reviewed and noted the following in the patient's chart:   Medical and social history Use of alcohol, tobacco or illicit drugs  Current medications and supplements including opioid prescriptions. Patient is not currently taking opioid prescriptions. Functional ability and status Nutritional status Physical activity Advanced directives List of other physicians Hospitalizations, surgeries, and ER visits in previous 12 months Vitals Screenings to include cognitive, depression, and falls Referrals and appointments  In addition,  I have reviewed and discussed with patient certain preventive protocols, quality metrics, and best practice recommendations. A written personalized care plan for preventive services as well as general preventive health recommendations were provided to patient.     Terre Ferri, DO   05/10/2023   After Visit Summary: (In Person-Printed) AVS printed and given to the patient  Nurse Notes: N/A

## 2023-05-11 ENCOUNTER — Encounter: Payer: Self-pay | Admitting: Family Medicine

## 2023-05-11 DIAGNOSIS — N401 Enlarged prostate with lower urinary tract symptoms: Secondary | ICD-10-CM | POA: Insufficient documentation

## 2023-05-11 MED ORDER — LEVOTHYROXINE SODIUM 112 MCG PO TABS: 112.0000 ug | ORAL_TABLET | Freq: Every day | ORAL | 4 refills | Status: DC

## 2023-05-11 NOTE — Assessment & Plan Note (Signed)
 Rechecking labs today. Await results. Treat as needed.

## 2023-05-11 NOTE — Assessment & Plan Note (Signed)
 Under good control on current regimen. Continue current regimen. Continue to monitor. Call with any concerns. Refills given. Labs drawn today.

## 2023-05-11 NOTE — Patient Instructions (Signed)
  [name] , Thank you for taking time to come for your Medicare Wellness Visit. I appreciate your ongoing commitment to your health goals. Please review the following plan we discussed and let me know if I can assist you in the future.   These are the goals we discussed:  Goals      Patient Stated     12/14/2019, no goals     Patient Stated     No goals     Pharmacy Care Plan        This is a list of the screening recommended for you and due dates:  Health Maintenance  Topic Date Due   Screening for Lung Cancer  06/12/2020   Zoster (Shingles) Vaccine (1 of 2) 07/26/2023*   Flu Shot  08/12/2023   Medicare Annual Wellness Visit  05/09/2024   Colon Cancer Screening  02/18/2028   DTaP/Tdap/Td vaccine (2 - Tdap) 01/09/2032   Pneumonia Vaccine  Completed   Hepatitis C Screening  Completed   HPV Vaccine  Aged Out   Meningitis B Vaccine  Aged Out   COVID-19 Vaccine  Discontinued  *Topic was postponed. The date shown is not the original due date.

## 2023-05-11 NOTE — Assessment & Plan Note (Signed)
 Under good control on current regimen. Continue current regimen. Continue to monitor. Call with any concerns. Refills given.

## 2023-05-11 NOTE — Assessment & Plan Note (Signed)
 Continue to follow with oncology. Call with any concerns. Continue to monitor.

## 2023-05-11 NOTE — Assessment & Plan Note (Signed)
 Not doing well. Given low BP will start cialis. Recheck 3 months. Call with any concerns.

## 2023-05-11 NOTE — Assessment & Plan Note (Addendum)
 Off medicine. BP running low. Increase fluids. Call with any concerns.

## 2023-05-13 ENCOUNTER — Encounter: Payer: Self-pay | Admitting: Family Medicine

## 2023-05-13 ENCOUNTER — Other Ambulatory Visit: Payer: Self-pay | Admitting: Family Medicine

## 2023-05-13 LAB — COMPREHENSIVE METABOLIC PANEL WITH GFR
ALT: 27 IU/L (ref 0–44)
AST: 38 IU/L (ref 0–40)
Albumin: 4.4 g/dL (ref 3.9–4.9)
Alkaline Phosphatase: 106 IU/L (ref 44–121)
BUN/Creatinine Ratio: 23 (ref 10–24)
BUN: 30 mg/dL — ABNORMAL HIGH (ref 8–27)
Bilirubin Total: 0.3 mg/dL (ref 0.0–1.2)
CO2: 20 mmol/L (ref 20–29)
Calcium: 9.4 mg/dL (ref 8.6–10.2)
Chloride: 105 mmol/L (ref 96–106)
Creatinine, Ser: 1.3 mg/dL — ABNORMAL HIGH (ref 0.76–1.27)
Globulin, Total: 2 g/dL (ref 1.5–4.5)
Glucose: 133 mg/dL — ABNORMAL HIGH (ref 70–99)
Potassium: 4.6 mmol/L (ref 3.5–5.2)
Sodium: 142 mmol/L (ref 134–144)
Total Protein: 6.4 g/dL (ref 6.0–8.5)
eGFR: 59 mL/min/{1.73_m2} — ABNORMAL LOW (ref >59)

## 2023-05-13 LAB — LIPID PANEL W/O CHOL/HDL RATIO
Cholesterol, Total: 188 mg/dL (ref 100–199)
HDL: 35 mg/dL — ABNORMAL LOW (ref >39)
LDL Chol Calc (NIH): 118 mg/dL — ABNORMAL HIGH (ref 0–99)
Triglycerides: 196 mg/dL — ABNORMAL HIGH (ref 0–149)
VLDL Cholesterol Cal: 35 mg/dL (ref 5–40)

## 2023-05-13 LAB — CBC WITH DIFFERENTIAL/PLATELET
Basophils Absolute: 0 10*3/uL (ref 0.0–0.2)
Basos: 0 %
EOS (ABSOLUTE): 0.1 10*3/uL (ref 0.0–0.4)
Eos: 1 %
Hematocrit: 40.3 % (ref 37.5–51.0)
Hemoglobin: 13.6 g/dL (ref 13.0–17.7)
Immature Grans (Abs): 0 10*3/uL (ref 0.0–0.1)
Immature Granulocytes: 0 %
Lymphocytes Absolute: 1.1 10*3/uL (ref 0.7–3.1)
Lymphs: 11 %
MCH: 31 pg (ref 26.6–33.0)
MCHC: 33.7 g/dL (ref 31.5–35.7)
MCV: 92 fL (ref 79–97)
Monocytes Absolute: 0.6 10*3/uL (ref 0.1–0.9)
Monocytes: 6 %
Neutrophils Absolute: 8 10*3/uL — ABNORMAL HIGH (ref 1.4–7.0)
Neutrophils: 82 %
Platelets: 244 10*3/uL (ref 150–450)
RBC: 4.39 x10E6/uL (ref 4.14–5.80)
RDW: 13.2 % (ref 11.6–15.4)
WBC: 10 10*3/uL (ref 3.4–10.8)

## 2023-05-13 LAB — SPOTTED FEVER GROUP ANTIBODIES
Spotted Fever Group IgG: <1:64 {titer}
Spotted Fever Group IgM: 1:128 {titer} — ABNORMAL HIGH

## 2023-05-13 LAB — TSH: TSH: 1.33 u[IU]/mL (ref 0.450–4.500)

## 2023-05-13 LAB — VITAMIN D 25 HYDROXY (VIT D DEFICIENCY, FRACTURES): Vit D, 25-Hydroxy: 37 ng/mL (ref 30.0–100.0)

## 2023-05-13 LAB — PSA: Prostate Specific Ag, Serum: 0.6 ng/mL (ref 0.0–4.0)

## 2023-05-13 LAB — LYME DISEASE SEROLOGY W/REFLEX: Lyme Total Antibody EIA: NEGATIVE

## 2023-05-13 MED ORDER — DOXYCYCLINE HYCLATE 100 MG PO TABS: 100.0000 mg | ORAL_TABLET | Freq: Two times a day (BID) | ORAL | 0 refills | Status: DC

## 2023-05-30 ENCOUNTER — Encounter: Admitting: Family Medicine

## 2023-07-27 DIAGNOSIS — I214 Non-ST elevation (NSTEMI) myocardial infarction: Secondary | ICD-10-CM | POA: Insufficient documentation

## 2023-07-27 HISTORY — DX: Non-ST elevation (NSTEMI) myocardial infarction: I21.4

## 2023-07-28 DIAGNOSIS — E119 Type 2 diabetes mellitus without complications: Secondary | ICD-10-CM | POA: Insufficient documentation

## 2023-07-29 NOTE — Discharge Summary (Addendum)
 ------------------------------------------------------------------------------- Attestation signed by Luke Odell Ned, MD at 09/09/23 1054 Attending Physician Attestation:    I confirm that I interviewed and examined the patient, reviewed all the relevant lab and diagnostic data, and we agreed upon a plan of treatment. I agree with the findings, assessment, and plan as documented by Dr. SuenGLENWOOD [name].  I saw and evaluated the patient, participating in the key portions of the service on the day of discharge. I reviewed the resident's note and agree with the discharge plans and disposition. I personally spent over 30 minutes in discharge planning services.   Odell Brock, MD  -------------------------------------------------------------------------------   Physician Discharge Summary [name] [address] [place] [address] Dept: [phone] Loc: [phone]   Identifying Information:  Luke [name] July 31, 1950 [id number]  Primary Care Physician: Luke Duwaine SQUIBB, DO   Code Status: Full Code  Admit Date: 07/27/2023  Discharge Date: 09/08/2023   Discharge To: Home  Discharge Service: [name] - Cardiology Floor Team 1 [facility])   Discharge Attending Physician: No att. providers found  Discharge Diagnoses: Principal Problem:   NSTEMI (non-Luke elevated myocardial infarction)     (POA: Yes) Active Problems:   CAD (coronary artery disease) (POA: Unknown)   Chest pain (POA: Unknown)   DM2 (diabetes mellitus, type 2)     (POA: Unknown)   GERD (gastroesophageal reflux disease) (POA: Unknown)   Hypothyroidism, secondary (POA: Unknown)   History of malignant neoplasm of head and neck (POA: Not Applicable) Resolved Problems:   * No resolved [name] problems. [name] Course:  Outpatient follow-up: [ ]  Pt to follow-up with cardiac rehab [ ]  Cardiology w/ Luke [name]: A1C borderline, we did not start ACE-inhib; BP softer on day of discharge so did  not start a Bblocker.  ARB and angio II would similarly decrease BP, so there would not be an indication to start those on dc either until his BP returned closer to his baseline.  [ ]  PCP: Follow-up HgB; Diabetes management  Luke [name] is a 73 y.o. 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 Problems addressed during this hospitalization:   Active Problems:    NSTEMI  Chest Pain  S/p PCI to LAD  CAD 73 year old male without significant cardiac history, with risk factors including HLD, DM2, and 55 pack year smoking history presenting with stabbing substernal chest pain for 4 days. He had constant pain starting Saturday 7/12 that has persisted for several days leading to his ER visit. In the ED he had a troponin elevation to 14,115 with dynamic EKG findings including Luke elevation in V2 concerning for ischemia, and deep biphasic T waves in V2 consistent with Wellens. POCUS echo showed apical hypokinesis.Cath lab activated ISO Wellens and he was loaded with ASA and plavix and started on heparin gtt. Found to have CAD with mid LAD diffuse 80% stenosis with ulcerated plaque. Xience 2.75 x 38 mmhg deployed. Post PCI his chest pain has resolved and he is feeling well. He was started on DAPT with ASA 81 mg and ticagrelor 90 mg BID, and high-intensity statin Crestor  40 mg daily to decrease LDL to goal 55. He was also started on Lisinopril 2.5 mg daily for optimization; but discontinued on discharge given that he has borderline diabetes and BP were softer  on day of discharge. His Lipitor was switched to Crestor  40 mg po daily upon discharge. His troponins were trended down with a peak of 16,674. Approximately 24 hours after his LHC and PCI he developed rigors and a fever to 38.3  and had a fleeting sharp pain in his RUQ that resolved and did not return. Fever and rigors resolved overnight without acetaminophen , however, he was started on abx and an infectious work-up was initiated (CXR and KUB unremarkable, no leukocytosis, hemodynamically stable, denied chest pain, SOB, dizziness or return of the RUQ pain). Given fast resolution of fever and rigors, suspect they were a result of a post-procedural inflammatory response that self resolved. We will continue to follow-up the blood cultures (drawn prior to abx initiation). He will follow-up with his PCP outpatient and establish care with [name] in [facility] for cardiology and cardiac rehab. ACE-inhib not restarted on DC given BP softer on day of discharge, additionally did not start a Bblocker.  ARB and angio II would similarly decrease BP, so there would not be an indication to start those on dc either until his BP returned closer to his baseline.   DM2 Last A1c was 6.7% at PCP visit in April meeting criteria for T2DM. Not currently on any antihyperglycemics. He was on sliding scale insulin while inpatient.   Chronic Problems   GERD - He was continued on his home omeprazole  20mg  daily. Secondary hypothyroidism - after laryngectomy in 2022. Continued home levothyroxine  112 mcg daily.     Outpatient Provider Follow Up Issues:  [ ]  Pt to follow-up with cardiac rehab [ ]  Cardiology w/ Luke [name]: A1C borderline, we did not start ACE-inhib; BP softer on day of discharge so did not start a Bblocker.  [ ]  PCP: Follow-up HgB; Diabetes management  Touchbase with Outpatient Provider: Warm Handoff: Completed on 09/08/23 by Luke DELENA Dutch, MD  (Intern) via [place] [name] Message  Procedures: Left heart cath with stent placement   ______________________________________________________________________ Discharge Medications:    Your Medication List     START taking these medications    aspirin  81 MG chewable tablet Chew  1 tablet (81 mg total) daily.   nitroglycerin 0.4 MG SL tablet Commonly known as: NITROSTAT Place 1 tablet (0.4 mg total) under the tongue every five (5) minutes as needed for chest pain.   ticagrelor 90 mg Tab Commonly known as: BRILINTA Take 1 tablet (90 mg total) by mouth two (2) times a day.       CHANGE how you take these medications    rosuvastatin  40 MG tablet Commonly known as: CRESTOR  Take 1 tablet (40 mg total) by mouth daily. What changed:  medication strength how much to take when to take this       CONTINUE taking these medications    levothyroxine  112 mcg Cap Take by mouth in the morning.   multivitamin per tablet Commonly known as: TAB-A-VITE/THERAGRAN Take 1 tablet by mouth daily.   omeprazole  20 MG tablet Commonly known as: PriLOSEC OTC Take 1 tablet (20 mg total) by mouth daily.   traZODone  50 MG tablet Commonly known as: DESYREL  Take 1 tablet (50 mg total) by mouth nightly.        Allergies: Patient has no known allergies. ______________________________________________________________________ Pending Test Results:    Most Recent Labs: All lab results last 24 hours -  No results found for this or any previous visit (from the past 24 hours).   Relevant Studies/Radiology: US  carotid bilateral Result Date:  08/25/2023 Procedure: US  CAROTID BILATERAL Indication: Hx head and neck cancer, eval carotids post neck RT, Z85.21 Personal history of malignant neoplasm of larynx, Z92.3 Personal history of irradiation, Z87.891 Personal history of nicotine  dependence Comparison: CT neck 02/22/2020 and 02/04/2020 Technique: Gray-scale ultrasound, Color Doppler, and spectral Doppler evaluation of bilateral extracranial carotid artery systems and vertebral arteries in the neck. Findings: RIGHT: On the right, there is scattered atherosclerotic plaque within the common and internal carotid arteries. Total or near-total occlusion of the external carotid artery..  Evidence of hemodynamically significant stenosis in the internal carotid artery: None.      - Peak Systolic Velocity (cm/sec): Less than 180 cm/sec      - End Diastolic Velocity (cm/sec): Less than 100 cm/sec Flow in the right vertebral artery is antegrade. LEFT: On the left, there is scattered atherosclerotic plaque within the common and internal carotid arteries. Total or near-total occlusion of the external carotid artery. Evidence of hemodynamically significant stenosis in the internal carotid artery: None.      - Peak Systolic Velocity (cm/sec): less than 180 cm/sec      - End Diastolic Velocity (cm/sec): Less than 100 cm/sec Flow in the left vertebral artery is antegrade. Impression: 1.  No evidence of hemodynamically significant stenosis in the bilateral internal carotid arteries. 2.  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 Electronically Reviewed by:  Alm Broad, MD, [facility] Electronically Reviewed on:  08/25/2023 10:11 AM I have reviewed the images and concur with the above findings. Electronically Signed by:  Edsel Isaacs, MD, [facility] Electronically Signed on:  08/25/2023 11:26 AM  CT chest without contrast with 3D MIPS Protocol Result Date: 08/09/2023 CT CHEST WITHOUT CONTRAST WITH 3D MIPS PROTOCOL Indication: hx head and neck cancer, hx smoking, eval for new or met lung met, C32.9 Malignant neoplasm of larynx, unspecified (CMS/HHS-HCC), S12.108 Personal history of nicotine  dependence, Z92.3 Personal history of irradiation Comparison Exams:  08/06/2022 CT chest Protocol:  Chest CT WO Protocol.  Contiguous 0.625 mm axial images with 5 mm axial, 3 mm coronal, and 3 mm sagittal reconstructions were obtained from the neck base through the upper abdomen without IV contrast material. In addition, 3-D maximal intensity projection (MIP) reconstructions were performed to potentially increase the sensitivity for the detection of  pulmonary nodules. FINDINGS: SCOUT: Reviewed LOWER NECK: Postsurgical changes of neck dissection, laryngectomy, and tracheostomy. No supraclavicular or axillary lymphadenopathy. MEDIASTINUM/HEART/VESSELS: Unchanged size of the tubular ascending aorta measuring 44 mm. Patulous esophagus. Small hiatal hernia. No mediastinal lymphadenopathy. Mild coronary artery calcification with LAD stent. VISIBLE ABDOMEN: Minimal calcified plaque in the abdominal aorta. LUNGS/AIRWAYS/PLEURA: Secretions in the trachea mild mucus plugging in the right lower lobe. Mild upper lobe predominant centrilobular emphysema. Mild biapical pleuroparenchymal scarring. Improved right upper lobe tree-in-bud nodularity. Persistent mild centrilobular nodules in the right middle lobe. Few discrete sub-6 mm solid nodules are unchanged, for example 3 mm solid nodule in the distal right upper lobe on series 4 image 99. No suspicious pulmonary nodule. BONES/SOFT TISSUES: Mild degenerative changes of thoracic spine. No suspicious osseous lesion. IMPRESSION: 1.  No metastatic disease in the thorax. 2.  Improved mild infectious or inflammatory bronchiolitis. 3.  Unchanged dilated tubular ascending aorta measuring 44 mm. Electronically Signed by:  Ozell Coil, MD, [facility] Electronically Signed on:  08/09/2023 9:42 AM  ECG 12 Lead Result Date: 07/30/2023 NORMAL SINUS RHYTHM ANTEROSEPTAL INFARCT (CITED ON OR BEFORE 27-Jul-2023) ABNORMAL ECG WHEN COMPARED WITH ECG  OF 27-Jul-2023 22:45, QUESTIONABLE CHANGE IN INITIAL FORCES OF ANTERIOR LEADS Confirmed by Von Shawl [phone]) on 07/30/2023 3:44:30 PM  ECG 12 Lead Result Date: 07/30/2023 NORMAL SINUS RHYTHM SEPTAL INFARCT  (CITED ON OR BEFORE 27-Jul-2023) T WAVE ABNORMALITY, CONSIDER ANTEROLATERAL ISCHEMIA ABNORMAL ECG WHEN COMPARED WITH ECG OF 27-Jul-2023 19:18, SERIAL CHANGES OF SEPTAL INFARCT  PRESENT Confirmed by Von Shawl [phone]) on 07/30/2023 3:44:26 PM  Echocardiogram W Colorflow  Spectral Doppler With Contrast Result Date: 07/29/2023 Patient Info Name:     Luke [name] Age:     12 years DOB:     March 17, 1950 Gender:     Male MRN:     [id number] Accession #:     [id number] UN Account #:     [id number] Ht:     185 cm Wt:     95 kg BSA:     2.23 m2 BP:     116 /     86 mmHg Exam Date:     07/28/2023 5:02 PM Admit Date:     07/27/2023 Exam Type:     ECHOCARDIOGRAM W COLORFLOW SPECTRAL DOPPLER W CONTRAST Technical Quality:     Good Staff Sonographer:     Wilbert Fake Reading Fellow:     Bennie Dorise Loose MD Ordering Physician:     Belvie Ferries Study Info Indications      - follow up NSTEMI ; Definity/Optison Procedure(s)   Complete two-dimensional, color flow and Doppler transthoracic echocardiogram is performed with contrast to opacify the left ventricle and to improve the delineation of the left ventricle endocardial borders. Ultrasound Enhancing Agent/Agitated Saline ------------------------------ UEA/Ag. Saline:     Definity Amount:     2.00 ml Summary   1. The left ventricle is normal in size with normal wall thickness.   2. The left ventricular systolic function is moderately decreased, LVEF is visually estimated at 35-40%.   3. There is hypokinesis of the LV apex as well as the mid anterior, mid anterolateral, and mid anteroseptal walls.   4. The right ventricle is normal in size, with normal systolic function.   5. The ascending aorta is mildly dilated at 4.4 cm. Left Ventricle   The left ventricle is normal in size with normal wall thickness. The left ventricular systolic function is moderately decreased, LVEF is visually estimated at 35-40%. The left ventricular ejection fraction was quantified (MOD bi-plane) at 39 %. There is hypokinesis of the LV apex as well as the mid anterior, mid anterolateral, and mid anteroseptal walls. Diastolic parameters suggest elevated left atrial pressure. No left ventricular thrombus visualized. Right Ventricle   The right ventricle is  normal in size, with normal systolic function. Left Atrium   The left atrium is normal in size. Right Atrium   The right atrium is normal in size. Aortic Valve   The aortic valve is trileaflet with mildly thickened leaflets with normal excursion. There is no significant aortic regurgitation. There is no evidence of a significant transvalvular gradient. Mitral Valve   The mitral valve leaflets are normal with normal leaflet mobility. There is mild mitral valve regurgitation. Tricuspid Valve   The tricuspid valve leaflets are normal, with normal leaflet mobility. There is no significant tricuspid regurgitation. The pulmonary systolic pressure cannot be estimated due to insufficient TR signal. Pulmonic Valve   The pulmonic valve is normal. There is trivial pulmonic regurgitation. There is no evidence of a significant transvalvular gradient. Aorta   The ascending aorta is mildly dilated at 4.4 cm. Inferior  Vena Cava   IVC size and inspiratory change suggest mildly elevated right atrial pressure. (5-10 mmHg). Pericardium/Pleural   There is no pericardial effusion. Ventricles ---------------------------------------------------------------------- Name                                 Value        Normal ---------------------------------------------------------------------- LV Dimensions 2D/MM ----------------------------------------------------------------------  IVS Diastolic Thickness (2D)                                1.0 cm       0.6-1.0 LVID Diastole (2D)                  5.1 cm       4.2-5.8  LVPW Diastolic Thickness (2D)                                0.7 cm       0.6-1.0 LVID Systole (2D)                   3.3 cm       2.5-4.0 LV Mass Index (2D Cubed)           68 g/m2        49-115  Relative Wall Thickness (2D)                                  0.27        <=0.42 LV Function ---------------------------------------------------------------------- LV EF (4C MOD)                        43 %                LV Diastolic  Volume Index (BP MOD)                        61.3 ml/m2     34.0-74.0 LV EF (BP MOD)                        39 %         52-72 RV Dimensions 2D/MM ----------------------------------------------------------------------  RV Basal Diastolic Dimension                           3.2 cm       2.5-4.1 TAPSE                               1.6 cm         >=1.7 Atria ---------------------------------------------------------------------- Name                                 Value        Normal ---------------------------------------------------------------------- LA Dimensions ---------------------------------------------------------------------- LA Dimension (2D)                   4.9 cm       3.0-4.1 LA Volume Index (4C A-L)        23.12 ml/m2  LA Volume Index (2C A-L)        31.10 ml/m2               LA Volume (BP MOD)                   56 ml               LA Volume Index (BP MOD)        25.24 ml/m2   16.00-34.00 RA Dimensions ---------------------------------------------------------------------- RA Area (4C)                      11.9 cm2        <=18.0 RA Area (4C) Index              5.3 cm2/m2               RA ESV Index (4C MOD)             11 ml/m2         18-32 Aortic Valve ---------------------------------------------------------------------- Name                                 Value        Normal ---------------------------------------------------------------------- AV Doppler ---------------------------------------------------------------------- AV Peak Velocity                   1.2 m/s               AV Peak Gradient                    6 mmHg Mitral Valve ---------------------------------------------------------------------- Name                                 Value        Normal ---------------------------------------------------------------------- MV Diastolic Function ---------------------------------------------------------------------- MV E Peak Velocity                 78 cm/s               MV A  Peak Velocity                 67 cm/s               MV E/A                                 1.2               MV Annular TDI ---------------------------------------------------------------------- MV Septal e' Velocity             6.3 cm/s         >=8.0 MV E/e' (Septal)                      12.4               MV Lateral e' Velocity            8.5 cm/s        >=10.0 MV E/e' (Lateral)                      9.2               MV e' Average  7.4 cm/s               MV E/e' (Average)                     10.8 Tricuspid Valve ---------------------------------------------------------------------- Name                                 Value        Normal ---------------------------------------------------------------------- Estimated PAP/RSVP ---------------------------------------------------------------------- RA Pressure                         3 mmHg           <=5 Pulmonic Valve ---------------------------------------------------------------------- Name                                 Value        Normal ---------------------------------------------------------------------- PV Doppler ---------------------------------------------------------------------- PV Peak Velocity                   1.2 m/s Aorta ---------------------------------------------------------------------- Name                                 Value        Normal ---------------------------------------------------------------------- Ascending Aorta ---------------------------------------------------------------------- Ao Root Diameter (2D)               3.7 cm               Ao Root Diam Index (2D)          1.7 cm/m2               Ascending Aorta Diameter            4.4 cm Venous ---------------------------------------------------------------------- Name                                 Value        Normal ---------------------------------------------------------------------- IVC/SVC  ---------------------------------------------------------------------- IVC Diameter (Exp 2D)               1.5 cm         <=2.1 Report Signatures Finalized by Concha Lonni An  MD on 07/29/2023 11:08 AM Resident Bennie Dorise Loose  MD on 07/28/2023 07:21 PM  XR Abdomen 1 View Result Date: 07/29/2023 EXAM: XR ABDOMEN 1 VIEW ACCESSION: [id number] UN REPORT DATE: 07/28/2023 9:07 PM CLINICAL INDICATION: 73 years old with ABDOMINAL PAIN  -  OTHER SPECIFIED SITE  COMPARISON: None TECHNIQUE: Supine view of the abdomen, 2 image(s) FINDINGS: Nonobstructive bowel gas pattern with moderate amount of stool. Pelvic phleboliths. Bibasilar atelectasis. Multilevel degenerative disease of the spine.   Nonobstructive bowel gas pattern with moderate stool burden   XR Chest Portable Result Date: 07/28/2023 EXAM: XR CHEST PORTABLE ACCESSION: [id number] UN REPORT DATE: 07/28/2023 8:04 PM CLINICAL INDICATION: CHEST PAIN  TECHNIQUE: Single View AP Chest Radiograph. COMPARISON: Chest radiograph 07/27/2023 FINDINGS: Cephalization of the pulmonary vasculature and increased interstitial opacities. No pleural effusion or pneumothorax. Borderline enlarged cardiac silhouette. Thoracic aorta is tortuous.   Mild interstitial pulmonary edema   ECG 12 Lead Result Date: 07/28/2023 NORMAL SINUS RHYTHM LEFT VENTRICULAR HYPERTROPHY WITH REPOLARIZATION ABNORMALITY ( R in aVL ) SEPTAL INFARCT  (CITED ON OR BEFORE 27-Jul-2023) ABNORMAL ECG WHEN COMPARED  WITH ECG OF 27-Jul-2023 18:38, NO SIGNIFICANT CHANGE WAS FOUND Confirmed by Von Shawl [phone]) on 07/28/2023 4:04:33 PM  ECG 12 Lead Result Date: 07/28/2023 NORMAL SINUS RHYTHM LEFT VENTRICULAR HYPERTROPHY WITH REPOLARIZATION ABNORMALITY ( R in aVL ) SEPTAL INFARCT  (CITED ON OR BEFORE 27-Jul-2023) ABNORMAL ECG WHEN COMPARED WITH ECG OF 27-Jul-2023 17:21, NO SIGNIFICANT CHANGE WAS FOUND Confirmed by Von Shawl [phone]) on 07/28/2023 4:03:11 PM  Cath/Vascular Procedure Result  Date: 07/27/2023 FINAL CARDIAC CATHETERIZATION REPORT CONCLUSIONS: - Coronary artery disease including mid-LAD 80% stenosis (IRA), 70% ostial PDA, and otherwise mild-moderate non-obstructive disease - Elevated LVEDP 18 mmHg - Borderline LV function with apical anterior hypokinesis - Successful IVUS-guided PCI to the mid LAD with a Xience 2.75x38 mm stent deployed RECOMMENDATIONS: - Reloaded with ticagrelor in lab (emesis after first load) - Cangrelor to run until 23:52 - TR band protocol - IVF per POSEIDON protocol PATIENT NAME: Luke [name] INDICATION: 73 year old male who presented with ongoing chest pain with troponin elevation concerning for high-risk NSTEMI PROCEDURE DATE: 2023-07-27 ACCESS SITE: right radial artery PHYSICIANS: Lucendia Rome (Attending), Arthea Ehrich (Fellow - Interventional) REFERRING: Referred Self Diagnostic procedures: coronary angiography, left heart cath, left ventriculogram Interventional procedures: PCI Contrast Used (ml): 100 DIAGNOSTIC FINDINGS: Coronary Angiography: Coronary Dominance: RCA Left Main: - Ostial Left Main: no significant stenosis - Left Main: no significant stenosis Left Anterior Descending: - Ostial LAD: no significant stenosis - Proximal LAD: 20% stenosis - Mid LAD: 80% stenosis - Distal LAD: moderate irregularity - First diagonal: small caliber with moderate irregularity - Second diagonal: small caliber with mild irregularity - Third diagonal: small caliber with mild irregularity Left Circumflex: - Ostial LCx: no significant stenosis - Proximal LCx: 30% stenosis - Mid LCx: mild irregularity - Distal LCx: mild irregularity - OM1: moderate to large caliber with 20 to 30% stenosis (branching) Right coronary artery: - Ostial RCA: no significant stenosis - Proximal RCA: mild irregularity - Mid RCA: 30 to 40% stenosis - Distal RCA: mild irregularity - PDA: 70% stenosis (ostial) - RCA continuation: no significant stenosis - PL1: tiny caliber with no significant  stenosis - PL2: tiny caliber with no significant stenosis - PL3: small to moderate caliber with mild irregularity Left Ventriculogram: - Left ventricular systolic function is borderline. The apical anterior segment is hypokinetic. The ejection fraction is estimated at 50%. Hemodynamics: BP / Ao (mmHg): 112/76  Mean: 88  Left Heart: LVEDP (mmHg): 18  There was no significant gradient across the aortic valve. Technique: The right radial artery was accessed with an angiocath and a 31F sheath was placed. 3mg  of verapamil was administered via the sheath. After accessing the ascending aorta, heparin was administered. At the end of the procedure, a TR band was placed to achieve hemostasis. Ultrasound was used for arterial access. LESION ASSESSMENT / INTERVENTION: Procedural antiplatelet regimen: ASA, ticagrelor (vomited after taking), cangrelor Procedural anticoagulant regimen: heparin Lesion location: proximal LAD Pre: Stenosis: 80%        Post: Stenosis: 0%         Lesion length: 33        Pre: TIMI: 3        Post: TIMI: 3 Guidewires Used: Guidewire 180cm Runthrough Ns Guiders Used: Data processing manager 22fr Ebu4.0 (3.5 attempted and too short) Balloons Pre Used: Trek 2.25 X15 mm, max ATM: 12 Balloons Post Used: Ruffin Trek Neo 3X15 mm, max ATM: 16 Stent(s): Xience 2.75x38 mm, max ATM: 12 IVUS IVUS was performed for stent optimization. The proximal reference  vessel lumen measured 3.2 mm. The distal reference vessel lumen measured 2.7 mm. Post PCI there was good stent expansion and no evidence of edge dissection. Catheters used: 2F JR 4, 2F JL 4 Complications: none reported Estimated blood loss: < 30 cc Radiation: Fluoro time (min): 11.5, Air Kerma Dose (mGy): 369.8, DAP (Gy-cm2): 40.8 I have reviewed the recent history physical and documentation. I personally spent 72 minutes, continuously monitoring the patient face to face during the administration of moderate sedation. Independent observer RN was present for the duration of  the procedure to assist in patient monitoring. Pre and post sedation activities have been reviewed. I (Dr. Lucendia Rome) was present for the entire procedure. Cath History / NCDR angina class: IV   cardiac arrest: no   post arrest responsiveness: n/a   cath presentation: NSTEMI CHSA frailty score: vulnerable   CKD stage: GFR 30-44   dialysis: No   noninvasive study result: n/a noninvasive study type: None   PMH: n/a   procedure status: Urgent   NYHA functional class: III STEMI: n/a   PCI status: 5. Urgent PCI without shock or cardiovascular instability   surgical turndown: n/a     ECG 12 Lead Result Date: 07/27/2023 NORMAL SINUS RHYTHM LEFT VENTRICULAR HYPERTROPHY WITH REPOLARIZATION ABNORMALITY ( R in aVL ) SEPTAL INFARCT  , AGE UNDETERMINED ABNORMAL ECG NO PREVIOUS ECGS AVAILABLE Confirmed by Von Shawl ([id number]) on 07/27/2023 9:26:11 PM  XR Chest 2 views Result Date: 07/27/2023 EXAM: XR CHEST 2 VIEWS ACCESSION: [id number] UN REPORT DATE: 07/27/2023 6:43 PM CLINICAL INDICATION: CHEST PAIN ; Chest Pain  TECHNIQUE: PA and Lateral Chest Radiographs COMPARISON: CT chest 02/03/2016 FINDINGS: Left basilar atelectasis. No pleural effusion or pneumothorax. Cardiac silhouette is normal in size. Thoracic aorta is tortuous.   No acute cardiopulmonary abnormalities.   ______________________________________________________________________ Discharge Instructions:  Activity Instructions     Activity as tolerated           Resources and Referrals   Cardiac Rehab referral sent to: [facility] SITE: [address] [place], [place], [address] [phone] Fax: [phone]        Follow Up instructions and Outpatient Referrals    Ambulatory Referral to Cardiac Rehab     Reason for Cardiac Rehab Referral: Acute MI (STEMI/NSTEMI) within the  last 12 months   Requested follow up plan: You would evaluate and manage.   Call MD for:   persistent nausea or vomiting     Call MD for:  severe uncontrolled pain     Call MD for: Temperature > 38.5 Celsius ( > 101.3 Fahrenheit)     Discharge instructions       Appointments which have been scheduled for you    Sep 16, 2023 11:00 AM RETURN CARDIOLOGY with Margery Ruth, MD [facility] CARDIOLOGY AT [facility] REGION) [address] [place] [address] [phone]        ______________________________________________________________________ Discharge Day Services: BP 95/79   Pulse (!) 43   Temp 37.1 C (98.8 F) (Oral)   Resp 19   Ht 185.4 cm (6' 0.99)   Wt 98.6 kg (217 lb 6 oz)   SpO2 94%   BMI 28.69 kg/m   Pt seen on the day of discharge and determined appropriate for discharge.  Condition at Discharge: fair  Length of Discharge: I spent greater than 30 mins in the discharge of this patient.

## 2023-08-01 ENCOUNTER — Telehealth: Payer: Self-pay

## 2023-08-01 NOTE — Transitions of Care (Post Inpatient/ED Visit) (Signed)
   08/01/2023  Name: Luke [name] MRN: [id number] DOB: 07/07/1950  Today's TOC FU Call Status:    Attempted to reach the patient regarding the most recent Inpatient/ED visit.  Follow Up Plan: Additional outreach attempts will be made to reach the patient to complete the Transitions of Care (Post Inpatient/ED visit) call.

## 2023-08-02 NOTE — Transitions of Care (Post Inpatient/ED Visit) (Signed)
   08/02/2023  Name: Luke [name] MRN: [id number] DOB: 1950-07-22  Today's TOC FU Call Status: Today's TOC FU Call Status:: Unsuccessful Call (2nd Attempt) Unsuccessful Call (1st Attempt) Date: 08/01/23 Unsuccessful Call (2nd Attempt) Date: 08/02/23  Attempted to reach the patient regarding the most recent Inpatient/ED visit.  Follow Up Plan: Additional outreach attempts will be made to reach the patient to complete the Transitions of Care (Post Inpatient/ED visit) call.

## 2023-08-03 ENCOUNTER — Other Ambulatory Visit: Payer: Self-pay | Admitting: Family Medicine

## 2023-08-03 NOTE — Transitions of Care (Post Inpatient/ED Visit) (Cosign Needed)
 08/03/2023  Name: Luke [name] MRN: [id number] DOB: [birth year]  Today's TOC FU Call Status: Today's TOC FU Call Status:: Unsuccessful Call (2nd Attempt) Unsuccessful Call (1st Attempt) Date: 08/01/23 Unsuccessful Call (2nd Attempt) Date: 08/02/23 Patient's Name and Date of Birth confirmed.  Transition Care Management Follow-up Telephone Call Date of Discharge: 07/29/23 Discharge Facility: Other [facility]) Name of Other ([facility]) Discharge Facility: [facility] Type of Discharge: Inpatient Admission Primary Inpatient Discharge Diagnosis:: Non-ST elevation (NSTEMI) myocardial infarction How have you been since you were released from the hospital?: Better (Still a bit tired) Any questions or concerns?: No  Items Reviewed: Did you receive and understand the discharge instructions provided?: Yes Any new allergies since your discharge?: No Dietary orders reviewed?: NA  Medications Reviewed Today: Medications Reviewed Today     Reviewed by Yzabella Crunk, [place] HERO, CMA (Certified Medical Assistant) on 08/03/23 at 1448  Med List Status: <None>   Medication Order Taking? Sig Documenting Provider Last Dose Status Informant  aspirin  EC 81 MG tablet [id number] Yes Take 1 tablet (81 mg total) by mouth daily. Swallow whole. Johnson, Megan P, DO  Active   doxycycline  (VIBRA -TABS) 100 MG tablet [id number]  Take 1 tablet (100 mg total) by mouth 2 (two) times daily. For 3 weeks- TAKE WITH FOOD  Patient not taking: Reported on 08/03/2023   Vicci Duwaine SQUIBB, DO  Active   levothyroxine  (SYNTHROID ) 112 MCG tablet [id number] Yes Take 1 tablet (112 mcg total) by mouth daily before breakfast. Vicci Duwaine SQUIBB, DO  Active   Multiple Vitamin (MULTI-VITAMIN) tablet [id number] Yes Take 1 tablet by mouth daily. [provider]  Active   nitroGLYCERIN (NITROSTAT) 0.4 MG SL tablet [id number] Yes Place 0.4 mg under the tongue every 5 (five) minutes as needed for chest pain. [provider]  Active   omeprazole  (PRILOSEC) 20 MG capsule [id number] Yes Take 1 capsule (20 mg total) by mouth daily. Johnson, Megan P, DO  Active   rosuvastatin  (CRESTOR ) 5 MG tablet [id number] Yes Take 1 tablet (5 mg total) by mouth 2 (two) times a week. Johnson, Megan P, DO  Active   tadalafil  (CIALIS ) 5 MG tablet [id number]  Take 1 tablet (5 mg total) by mouth daily.  Patient not taking: Reported on 08/03/2023   Vicci Duwaine SQUIBB, DO  Consider Medication Status and Discontinue (Patient Preference)   ticagrelor (BRILINTA) 90 MG TABS tablet [id number] Yes Take 90 mg by mouth 2 (two) times daily. [provider]  Active   traZODone  (DESYREL ) 50 MG tablet [id number]  Take 0.5-1 tablets (25-50 mg total) by mouth at bedtime as needed for sleep. Vicci Duwaine SQUIBB, DO  Consider Medication Status and Discontinue (Patient Preference)             Home Care and Equipment/Supplies: Were Home Health Services Ordered?: Yes (He is not sure what type of therapy was to be ordered.) Has Agency set up a time to come to your home?: No EMR reviewed for Home Health Orders: Orders present/patient has not received call (refer to CM for follow-up) Any new equipment or medical supplies ordered?: No  Functional Questionnaire: Do you need assistance with bathing/showering or dressing?: Yes Do you need assistance with meal preparation?: Yes Do you need assistance with eating?: Yes Do you have difficulty maintaining continence: Yes Do you need assistance with getting out of bed/getting out of a chair/moving?: Yes Do you have difficulty managing or taking your medications?: Yes  Follow up appointments reviewed: PCP Follow-up appointment  confirmed?: Yes Date of PCP follow-up appointment?: 08/10/24 Follow-up Provider: FREDDRICK Donold Louder, DO [facility] Follow-up appointment confirmed?: Yes Do you need transportation to your follow-up appointment?: No Do you understand care options if your  condition(s) worsen?: Yes-patient verbalized understanding

## 2023-08-04 NOTE — Telephone Encounter (Signed)
 Requested Prescriptions  Refused Prescriptions Disp Refills   rosuvastatin  (CRESTOR ) 5 MG tablet [Pharmacy Med Name: ROSUVASTATIN  5MG  TABLETS] 104 tablet 0    Sig: TAKE ONE TABLET BY MOUTH 2 TIMES A WEEK     Cardiovascular:  Antilipid - Statins 2 Failed - 08/04/2023  4:25 PM      Failed - Cr in normal range and within 360 days    Creat  Date Value Ref Range Status  02/09/2019 1.05 0.70 - 1.25 mg/dL Final    Comment:    For patients >71 years of age, the reference limit for Creatinine is approximately 13% higher for people identified as African-American. .    Creatinine, Ser  Date Value Ref Range Status  05/10/2023 1.30 (H) 0.76 - 1.27 mg/dL Final         Failed - Lipid Panel in normal range within the last 12 months    Cholesterol, Total  Date Value Ref Range Status  05/10/2023 188 100 - 199 mg/dL Final   LDL Cholesterol (Calc)  Date Value Ref Range Status  02/09/2019 131 (H) mg/dL (calc) Final    Comment:    Reference range: <100 . Desirable range <100 mg/dL for primary prevention;   <70 mg/dL for patients with CHD or diabetic patients  with > or = 2 CHD risk factors. SABRA LDL-C is now calculated using the Martin-Hopkins  calculation, which is a validated novel method providing  better accuracy than the Friedewald equation in the  estimation of LDL-C.  Gladis APPLETHWAITE et al. SANDREA. 7986;689(80): 2061-2068  (http://education.[facility].com/faq/FAQ164)    LDL Chol Calc (NIH)  Date Value Ref Range Status  05/10/2023 118 (H) 0 - 99 mg/dL Final   HDL  Date Value Ref Range Status  05/10/2023 35 (L) >39 mg/dL Final   Triglycerides  Date Value Ref Range Status  05/10/2023 196 (H) 0 - 149 mg/dL Final         Passed - Patient is not pregnant      Passed - Valid encounter within last 12 months    Recent Outpatient Visits           2 months ago Encounter for [employer] annual wellness exam   [place] [facility] [place], Megan P, DO   3 months ago  Acute non-recurrent maxillary sinusitis   [place] [facility] [place], [place], DO

## 2023-08-11 ENCOUNTER — Ambulatory Visit: Admitting: Family Medicine

## 2023-08-11 ENCOUNTER — Encounter: Payer: Self-pay | Admitting: Family Medicine

## 2023-08-11 VITALS — BP 95/62 | HR 74 | Temp 97.9°F | Ht 72.0 in | Wt 207.4 lb

## 2023-08-11 DIAGNOSIS — E118 Type 2 diabetes mellitus with unspecified complications: Secondary | ICD-10-CM | POA: Diagnosis not present

## 2023-08-11 DIAGNOSIS — Z7985 Long-term (current) use of injectable non-insulin antidiabetic drugs: Secondary | ICD-10-CM

## 2023-08-11 DIAGNOSIS — I214 Non-ST elevation (NSTEMI) myocardial infarction: Secondary | ICD-10-CM

## 2023-08-11 DIAGNOSIS — E1129 Type 2 diabetes mellitus with other diabetic kidney complication: Secondary | ICD-10-CM | POA: Insufficient documentation

## 2023-08-11 DIAGNOSIS — E7800 Pure hypercholesterolemia, unspecified: Secondary | ICD-10-CM

## 2023-08-11 MED ORDER — OZEMPIC (0.25 OR 0.5 MG/DOSE) 2 MG/3ML ~~LOC~~ SOPN
0.2500 mg | PEN_INJECTOR | SUBCUTANEOUS | 0 refills | Status: DC
Start: 2023-08-11 — End: 2023-10-21

## 2023-08-11 MED ORDER — OZEMPIC (0.25 OR 0.5 MG/DOSE) 2 MG/3ML ~~LOC~~ SOPN: 0.2500 mg | PEN_INJECTOR | SUBCUTANEOUS | Status: DC

## 2023-08-11 MED ORDER — ROSUVASTATIN CALCIUM 40 MG PO TABS: 40.0000 mg | ORAL_TABLET | Freq: Every day | ORAL | 1 refills | Status: DC

## 2023-08-11 MED ORDER — OZEMPIC (0.25 OR 0.5 MG/DOSE) 2 MG/3ML ~~LOC~~ SOPN
0.2500 mg | PEN_INJECTOR | SUBCUTANEOUS | Status: DC
Start: 2023-08-11 — End: 2023-10-21

## 2023-08-11 NOTE — Addendum Note (Signed)
 Addended by: Tryphena Perkovich T on: 08/11/2023 04:55 PM   Modules accepted: Orders

## 2023-08-11 NOTE — Assessment & Plan Note (Signed)
 Meds recently increased to crestor  40- too soon to recheck labs. Will recheck them next time. Continue current regimen.

## 2023-08-11 NOTE — Assessment & Plan Note (Signed)
 A1c 6.7. Given recent NSTEMI will start him on ozempic  for cardioprotection. Sample given today and instructed on how to use it. Follow up 1 month. Call with any concerns.

## 2023-08-11 NOTE — Progress Notes (Signed)
 BP 95/62   Pulse 74   Temp 97.9 F (36.6 C) (Oral)   Ht 6' (1.829 m)   Wt 207 lb 6.4 oz (94.1 kg)   SpO2 (!) 88%   BMI 28.13 kg/m    Subjective:    Patient ID: Luke [name], male    DOB: 03/10/1950, 73 y.o.   MRN: [id number]  HPI: Luke [name] is a 73 y.o. male  Chief Complaint  Patient presents with   Hospitalization Follow-up    07/27/23 MI   Shortness of Breath    O2 88    Transition of Care Hospital Follow up.   Hospital/Facility: [facility] D/C Physician: Dr. Brock D/C Date: 07/29/23  Records Requested: 08/11/23 Records Received:  08/11/23 Records Reviewed:  08/11/23  Diagnoses on Discharge: NSTEMI CAD (coronary artery disease) (POA: Unknown) Chest pain (POA: Unknown) DM2 (diabetes mellitus, type 2) (POA: Unknown) GERD (gastroesophageal reflux disease) (POA: Unknown) Hypothyroidism, secondary (POA: Unknown) History of malignant neoplasm of head and neck (POA: Not Applicable)   Date of interactive Contact within 48 hours of discharge: 08/01/23 Contact was through: phone  Date of 7 day or 14 day face-to-face visit: 08/11/23   within 14 days  Outpatient Encounter Medications as of 08/11/2023  Medication Sig   aspirin  EC 81 MG tablet Take 1 tablet (81 mg total) by mouth daily. Swallow whole.   levothyroxine  (SYNTHROID ) 112 MCG tablet Take 1 tablet (112 mcg total) by mouth daily before breakfast.   Multiple Vitamin (MULTI-VITAMIN) tablet Take 1 tablet by mouth daily.   nitroGLYCERIN (NITROSTAT) 0.4 MG SL tablet Place 0.4 mg under the tongue every 5 (five) minutes as needed for chest pain.   omeprazole  (PRILOSEC) 20 MG capsule Take 1 capsule (20 mg total) by mouth daily.   rosuvastatin  (CRESTOR ) 40 MG tablet Take 1 tablet (40 mg total) by mouth daily.   Semaglutide ,0.25 or 0.5MG /DOS, (OZEMPIC , 0.25 OR 0.5 MG/DOSE,) 2 MG/3ML SOPN Inject 0.25 mg into the skin once a week.   Semaglutide ,0.25 or 0.5MG /DOS, (OZEMPIC , 0.25 OR 0.5 MG/DOSE,) 2 MG/3ML SOPN Inject 0.25 mg into  the skin once a week.   tadalafil  (CIALIS ) 5 MG tablet Take 1 tablet (5 mg total) by mouth daily.   ticagrelor (BRILINTA) 90 MG TABS tablet Take 90 mg by mouth 2 (two) times daily.   traZODone  (DESYREL ) 50 MG tablet Take 0.5-1 tablets (25-50 mg total) by mouth at bedtime as needed for sleep.   [DISCONTINUED] rosuvastatin  (CRESTOR ) 5 MG tablet Take 1 tablet (5 mg total) by mouth 2 (two) times a week.   [DISCONTINUED] doxycycline  (VIBRA -TABS) 100 MG tablet Take 1 tablet (100 mg total) by mouth 2 (two) times daily. For 3 weeks- TAKE WITH FOOD (Patient not taking: Reported on 08/03/2023)   No facility-administered encounter medications on file as of 08/11/2023.  Per hospitalist: Luke [name] is a 73 y.o. 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 Problems addressed during this hospitalization:   Active Problems:   NSTEMI  Chest Pain  S/p PCI to LAD  CAD 73 year old male without significant cardiac history, with risk factors including HLD, DM2, and 55 pack year smoking history presenting with stabbing substernal chest pain for 4 days. He had constant pain starting Saturday 7/12 that has persisted for several days leading to his  ER visit. In the ED he had a troponin elevation to 14,115 with dynamic EKG findings including ST elevation in V2 concerning for ischemia, and deep biphasic T waves in V2 consistent with Wellens. POCUS echo showed apical hypokinesis.Cath lab activated ISO Wellens and he was loaded with ASA and plavix and started on heparin gtt. Found to have CAD with mid LAD diffuse 80% stenosis with ulcerated plaque. Xience 2.75 x 38 mmhg deployed. Post PCI his chest pain has resolved and he is feeling well. He was started on DAPT with ASA 81 mg and ticagrelor 90 mg  BID, and high-intensity statin Crestor  40 mg daily to decrease LDL to goal 55. He was also started on Lisinopril 2.5 mg daily for optimization; but discontinued on discharge given that he has borderline diabetes and BP were softer on day of discharge. His Lipitor was switched to Crestor  40 mg po daily upon discharge. His troponins were trended down with a peak of 16,674. Approximately 24 hours after his LHC and PCI he developed rigors and a fever to 38.3 and had a fleeting sharp pain in his RUQ that resolved and did not return. Fever and rigors resolved overnight without acetaminophen , however, he was started on abx and an infectious work-up was initiated (CXR and KUB unremarkable, no leukocytosis, hemodynamically stable, denied chest pain, SOB, dizziness or return of the RUQ pain). Given fast resolution of fever and rigors, suspect they were a result of a post-procedural inflammatory response that self resolved. We will continue to follow-up the blood cultures (drawn prior to abx initiation). He will follow-up with his PCP outpatient and establish care with Dr. Margery Ruth in [facility] for cardiology and cardiac rehab.   DM2 Last A1c was 6.7% at PCP visit in April meeting criteria for T2DM. Not currently on any antihyperglycemics. He was on sliding scale insulin while inpatient.  Chronic Problems  GERD - He was continued on his home omeprazole  20mg  daily. Secondary hypothyroidism - after laryngectomy in 2022. Continued home levothyroxine  112 mcg daily.    Diagnostic Tests Reviewed: Name:     Luke [name] Age:     75 years DOB:     Jan 05, 1951 Gender:     Male MRN:     [id number] Accession #:     [id number] UN Account #:     [id number] Ht:     185 cm Wt:     95 kg BSA:     2.23 m2 BP:     116 /     86 mmHg Exam Date:     07/28/2023 5:02 PM Admit Date:     07/27/2023  Exam Type:     ECHOCARDIOGRAM W COLORFLOW SPECTRAL DOPPLER W CONTRAST  Technical Quality:     Good  Staff Sonographer:      Luke [name] Reading Fellow:     Luke Dorise Loose MD Ordering Physician:     Luke [name]  Study Info Indications      - follow up NSTEMI ; Definity/Optison Procedure(s)   Complete two-dimensional, color flow and Doppler transthoracic echocardiogram is performed with contrast to opacify the left ventricle and to improve the delineation of the left ventricle endocardial borders.  Ultrasound Enhancing Agent/Agitated Saline  ------------------------------ UEA/Ag. Saline:     Definity Amount:     2.00 ml   Summary   1. The left ventricle is normal in size with normal wall thickness.   2. The left ventricular systolic function is moderately decreased, LVEF is visually estimated at  35-40%.   3. There is hypokinesis of the LV apex as well as the mid anterior, mid anterolateral, and mid anteroseptal walls.   4. The right ventricle is normal in size, with normal systolic function.   5. The ascending aorta is mildly dilated at 4.4 cm.   Left Ventricle   The left ventricle is normal in size with normal wall thickness. The left ventricular systolic function is moderately decreased, LVEF is visually estimated at 35-40%. The left ventricular ejection fraction was quantified (MOD bi-plane) at 39 %. There is hypokinesis of the LV apex as well as the mid anterior, mid anterolateral, and mid anteroseptal walls. Diastolic parameters suggest elevated left atrial pressure. No left ventricular thrombus visualized.  Right Ventricle   The right ventricle is normal in size, with normal systolic function.   Left Atrium   The left atrium is normal in size.  Right Atrium   The right atrium is normal in size.   Aortic Valve   The aortic valve is trileaflet with mildly thickened leaflets with normal excursion. There is no significant aortic regurgitation. There is no evidence of a significant transvalvular gradient.  Mitral Valve   The mitral valve leaflets are normal with  normal leaflet mobility. There is mild mitral valve regurgitation.  Tricuspid Valve   The tricuspid valve leaflets are normal, with normal leaflet mobility. There is no significant tricuspid regurgitation. The pulmonary systolic pressure cannot be estimated due to insufficient TR signal.  Pulmonic Valve   The pulmonic valve is normal. There is trivial pulmonic regurgitation. There is no evidence of a significant transvalvular gradient.   Aorta   The ascending aorta is mildly dilated at 4.4 cm.  Inferior Vena Cava   IVC size and inspiratory change suggest mildly elevated right atrial pressure. (5-10 mmHg).  Pericardium/Pleural   There is no pericardial effusion.   Ventricles ---------------------------------------------------------------------- Name                                 Value        Normal ----------------------------------------------------------------------  LV Dimensions 2D/MM ----------------------------------------------------------------------  IVS Diastolic Thickness (2D)                                1.0 cm       0.6-1.0 LVID Diastole (2D)                  5.1 cm       4.2-5.8  LVPW Diastolic Thickness (2D)                                0.7 cm       0.6-1.0 LVID Systole (2D)                   3.3 cm       2.5-4.0 LV Mass Index (2D Cubed)           68 g/m2        49-115  Relative Wall Thickness (2D)                                  0.27        <=0.42  LV Function ---------------------------------------------------------------------- LV EF (4C MOD)  43 %                LV Diastolic Volume Index (BP MOD)                        61.3 ml/m2     34.0-74.0 LV EF (BP MOD)                        39 %         52-72  RV Dimensions 2D/MM ----------------------------------------------------------------------  RV Basal Diastolic Dimension                           3.2 cm       2.5-4.1 TAPSE                               1.6 cm          >=1.7  Atria ---------------------------------------------------------------------- Name                                 Value        Normal ----------------------------------------------------------------------  LA Dimensions ---------------------------------------------------------------------- LA Dimension (2D)                   4.9 cm       3.0-4.1 LA Volume Index (4C A-L)        23.12 ml/m2               LA Volume Index (2C A-L)        31.10 ml/m2               LA Volume (BP MOD)                   56 ml               LA Volume Index (BP MOD)        25.24 ml/m2   16.00-34.00  RA Dimensions ---------------------------------------------------------------------- RA Area (4C)                      11.9 cm2        <=18.0 RA Area (4C) Index              5.3 cm2/m2               RA ESV Index (4C MOD)             11 ml/m2         18-32  Aortic Valve ---------------------------------------------------------------------- Name                                 Value        Normal ----------------------------------------------------------------------  AV Doppler ---------------------------------------------------------------------- AV Peak Velocity                   1.2 m/s               AV Peak Gradient                    6 mmHg  Mitral Valve ---------------------------------------------------------------------- Name  Value        Normal ----------------------------------------------------------------------  MV Diastolic Function ---------------------------------------------------------------------- MV E Peak Velocity                 78 cm/s               MV A Peak Velocity                 67 cm/s               MV E/A                                 1.2                MV Annular TDI ---------------------------------------------------------------------- MV Septal e' Velocity             6.3 cm/s         >=8.0 MV E/e' (Septal)                      12.4                MV Lateral e' Velocity            8.5 cm/s        >=10.0 MV E/e' (Lateral)                      9.2               MV e' Average                     7.4 cm/s               MV E/e' (Average)                     10.8  Tricuspid Valve ---------------------------------------------------------------------- Name                                 Value        Normal ----------------------------------------------------------------------  Estimated PAP/RSVP ---------------------------------------------------------------------- RA Pressure                         3 mmHg           <=5  Pulmonic Valve ---------------------------------------------------------------------- Name                                 Value        Normal ----------------------------------------------------------------------  PV Doppler ---------------------------------------------------------------------- PV Peak Velocity                   1.2 m/s  Aorta ---------------------------------------------------------------------- Name                                 Value        Normal ----------------------------------------------------------------------  Ascending Aorta ---------------------------------------------------------------------- Ao Root Diameter (2D)               3.7 cm               Ao Root Diam Index (2D)          1.7 cm/m2  Ascending Aorta Diameter            4.4 cm  Venous ---------------------------------------------------------------------- Name                                 Value        Normal ----------------------------------------------------------------------  IVC/SVC ---------------------------------------------------------------------- IVC Diameter (Exp 2D)               1.5 cm         <=2.1   Report Signatures Finalized by Concha Lonni An  MD on 07/29/2023 11:08 AM Resident Darryl Dorise Loose  MD on 07/28/2023 07:21 PM Procedure Note  An Concha Lonni, MD - 07/29/2023 Formatting of this note might be different from the original. Patient Info Name:     Luke [name] Age:     72 years DOB:     Jan 20, 1950 Gender:     Male MRN:     [id number] Accession #:     [id number] UN Account #:     [id number] Ht:     185 cm Wt:     95 kg BSA:     2.23 m2 BP:     116 /     86 mmHg Exam Date:     07/28/2023 5:02 PM Admit Date:     07/27/2023  Exam Type:     ECHOCARDIOGRAM W COLORFLOW SPECTRAL DOPPLER W CONTRAST  Technical Quality:     Good  Staff Sonographer:     Luke [name] Reading Fellow:     Luke Dorise Loose MD Ordering Physician:     Luke [name]  Study Info Indications      - follow up NSTEMI ; Definity/Optison Procedure(s)   Complete two-dimensional, color flow and Doppler transthoracic echocardiogram is performed with contrast to opacify the left ventricle and to improve the delineation of the left ventricle endocardial borders.  Ultrasound Enhancing Agent/Agitated Saline  ------------------------------ UEA/Ag. Saline:     Definity Amount:     2.00 ml   Summary   1. The left ventricle is normal in size with normal wall thickness.   2. The left ventricular systolic function is moderately decreased, LVEF is visually estimated at 35-40%.   3. There is hypokinesis of the LV apex as well as the mid anterior, mid anterolateral, and mid anteroseptal walls.   4. The right ventricle is normal in size, with normal systolic function.   5. The ascending aorta is mildly dilated at 4.4 cm.  This result has an attachment that is not available.  EXAM: XR CHEST PORTABLE  ACCESSION: [id number] UN  REPORT DATE: 07/28/2023 8:04 PM   CLINICAL INDICATION: CHEST PAIN    TECHNIQUE: Single View AP Chest Radiograph.   COMPARISON: Chest radiograph 07/27/2023   FINDINGS:   Cephalization of the pulmonary vasculature and increased interstitial opacities. No pleural effusion or pneumothorax.    Borderline enlarged cardiac silhouette. Thoracic aorta is tortuous.   CT CHEST WITHOUT CONTRAST WITH 3D MIPS PROTOCOL  Indication: hx head and neck cancer, hx smoking, eval for new or met lung met, C32.9 Malignant neoplasm of larynx, unspecified (CMS/HHS-HCC), S12.108 Personal history of nicotine  dependence, Z92.3 Personal history of irradiation  Comparison Exams:  08/06/2022 CT chest  Protocol:  Chest CT WO Protocol.  Contiguous 0.625 mm axial images with 5 mm axial, 3 mm coronal, and 3 mm sagittal reconstructions were obtained from the neck base through the upper abdomen without IV  contrast material. In addition, 3-D maximal intensity projection (MIP) reconstructions were performed to potentially increase the sensitivity for the detection of pulmonary nodules.  FINDINGS:  SCOUT:  Reviewed  LOWER NECK:  Postsurgical changes of neck dissection, laryngectomy, and tracheostomy. No supraclavicular or axillary lymphadenopathy.  MEDIASTINUM/HEART/VESSELS:  Unchanged size of the tubular ascending aorta measuring 44 mm. Patulous esophagus. Small hiatal hernia. No mediastinal lymphadenopathy. Mild coronary artery calcification with LAD stent.  VISIBLE ABDOMEN:  Minimal calcified plaque in the abdominal aorta.  LUNGS/AIRWAYS/PLEURA:  Secretions in the trachea mild mucus plugging in the right lower lobe. Mild upper lobe predominant centrilobular emphysema. Mild biapical pleuroparenchymal scarring. Improved right upper lobe tree-in-bud nodularity. Persistent mild centrilobular nodules in the right middle lobe. Few discrete sub-6 mm solid nodules are unchanged, for example 3 mm solid nodule in the distal right upper lobe on series 4 image 99. No suspicious pulmonary nodule.  BONES/SOFT TISSUES:  Mild degenerative changes of thoracic spine. No suspicious osseous lesion.  IMPRESSION:  1.  No metastatic disease in the thorax. 2.  Improved mild infectious or inflammatory  bronchiolitis. 3.  Unchanged dilated tubular ascending aorta measuring 44 mm.  Electronically Signed by:  Ozell Coil, MD, [facility] Electronically Signed on:  08/09/2023 9:42 AM Procedure Note  Coil Ozell Charleston, MD - 08/09/2023 Formatting of this note might be different from the original. CT CHEST WITHOUT CONTRAST WITH 3D MIPS PROTOCOL  Indication: hx head and neck cancer, hx smoking, eval for new or met lung met, C32.9 Malignant neoplasm of larynx, unspecified (CMS/HHS-HCC), S12.108 Personal history of nicotine  dependence, Z92.3 Personal history of irradiation  Comparison Exams:  08/06/2022 CT chest  Protocol:  Chest CT WO Protocol.  Contiguous 0.625 mm axial images with 5 mm axial, 3 mm coronal, and 3 mm sagittal reconstructions were obtained from the neck base through the upper abdomen without IV contrast material. In addition, 3-D maximal intensity projection (MIP) reconstructions were performed to potentially increase the sensitivity for the detection of pulmonary nodules.  FINDINGS:  SCOUT:  Reviewed  LOWER NECK:  Postsurgical changes of neck dissection, laryngectomy, and tracheostomy. No supraclavicular or axillary lymphadenopathy.  MEDIASTINUM/HEART/VESSELS:  Unchanged size of the tubular ascending aorta measuring 44 mm. Patulous esophagus. Small hiatal hernia. No mediastinal lymphadenopathy. Mild coronary artery calcification with LAD stent.  VISIBLE ABDOMEN:  Minimal calcified plaque in the abdominal aorta.  LUNGS/AIRWAYS/PLEURA:  Secretions in the trachea mild mucus plugging in the right lower lobe. Mild upper lobe predominant centrilobular emphysema. Mild biapical pleuroparenchymal scarring. Improved right upper lobe tree-in-bud nodularity. Persistent mild centrilobular nodules in the right middle lobe. Few discrete sub-6 mm solid nodules are unchanged, for example 3 mm solid nodule in the distal right upper lobe on series 4 image 99.  No suspicious pulmonary nodule.  BONES/SOFT TISSUES:  Mild degenerative changes of thoracic spine. No suspicious osseous lesion.  IMPRESSION:  1.  No metastatic disease in the thorax. 2.  Improved mild infectious or inflammatory bronchiolitis. 3.  Unchanged dilated tubular ascending aorta measuring 44 mm.  Disposition: Home  Consults: Cardiology  Discharge Instructions [ ]  Pt to follow-up with cardiac rehab [ ]  Cardiology w/ Dr. Margery Ruth: A1C borderline, we did not start ACE-inhib; BP softer on day of discharge so did not start a Bblocker.  [ ]  PCP: Follow-up [facility]; Diabetes management  Disease/illness Education: Discussed today  Home Health/Community Services Discussions/Referrals: Stage manager or re-establishment of referral orders for community resources: Placed today  Discussion with other health care providers: N/A  Assessment and Support of treatment regimen adherence: Good  Appointments Coordinated with:  Patient and wife  Education for self-management, independent living, and ADLs:  Discussed today  Since getting out of the hospital, Virat notes that he has been feeling pretty well. He has been having some continued chest tightness with activity and some SOB. No wheezing, no coughing. He has not gotten into cardiac rehab yet and is due to follow up with cardiology in about 2 weeks. No other significant issues.   Relevant past medical, surgical, family and social history reviewed and updated as indicated. Interim medical history since our last visit reviewed. Allergies and medications reviewed and updated.  Review of Systems  Constitutional: Negative.   HENT: Negative.    Respiratory:  Positive for chest tightness and shortness of breath. Negative for apnea, cough, choking, wheezing and stridor.   Cardiovascular: Negative.     Per HPI unless specifically indicated above     Objective:    BP 95/62   Pulse 74   Temp 97.9 F (36.6 C) (Oral)   Ht 6'  (1.829 m)   Wt 207 lb 6.4 oz (94.1 kg)   SpO2 (!) 88%   BMI 28.13 kg/m   Wt Readings from Last 3 Encounters:  08/11/23 207 lb 6.4 oz (94.1 kg)  04/26/23 212 lb 12.8 oz (96.5 kg)  08/20/22 208 lb 9.6 oz (94.6 kg)    Physical Exam Vitals and nursing note reviewed.  Constitutional:      General: He is not in acute distress.    Appearance: Normal appearance. He is well-developed and normal weight. He is not ill-appearing, toxic-appearing or diaphoretic.  HENT:     Head: Normocephalic and atraumatic.     Right Ear: External ear normal.     Left Ear: External ear normal.     Nose: Nose normal.     Mouth/Throat:     Mouth: Mucous membranes are moist.     Pharynx: Oropharynx is clear.  Eyes:     General: No scleral icterus.       Right eye: No discharge.        Left eye: No discharge.     Extraocular Movements: Extraocular movements intact.     Conjunctiva/sclera: Conjunctivae normal.     Pupils: Pupils are equal, round, and reactive to light.  Cardiovascular:     Rate and Rhythm: Normal rate and regular rhythm.     Pulses: Normal pulses.     Heart sounds: Normal heart sounds. No murmur heard.    No friction rub. No gallop.  Pulmonary:     Effort: Pulmonary effort is normal. No respiratory distress.     Breath sounds: Normal breath sounds. No stridor. No wheezing, rhonchi or rales.  Chest:     Chest wall: No tenderness.  Musculoskeletal:        General: Normal range of motion.     Cervical back: Normal range of motion and neck supple.  Skin:    General: Skin is warm and dry.     Capillary Refill: Capillary refill takes less than 2 seconds.     Coloration: Skin is not jaundiced or pale.     Findings: No bruising, erythema, lesion or rash.  Neurological:     General: No focal deficit present.     Mental Status: He is alert and oriented to person, place, and time. Mental status is at baseline.  Psychiatric:        Mood and Affect: Mood normal.  Behavior: Behavior  normal.        Thought Content: Thought content normal.        Judgment: Judgment normal.     Results for orders placed or performed in visit on 05/10/23  Bayer DCA Hb A1c Waived   Collection Time: 05/10/23  3:01 PM  Result Value Ref Range   HB A1C (BAYER DCA - WAIVED) 6.7 (H) 4.8 - 5.6 %  Microalbumin, Urine Waived   Collection Time: 05/10/23  3:01 PM  Result Value Ref Range   Microalb, Ur Waived 80 (H) 0 - 19 mg/L   Creatinine, Urine Waived 300 10 - 300 mg/dL   Microalb/Creat Ratio 30-300 (H) <30 mg/g  Comprehensive metabolic panel with GFR   Collection Time: 05/10/23  3:02 PM  Result Value Ref Range   Glucose 133 (H) 70 - 99 mg/dL   BUN 30 (H) 8 - 27 mg/dL   Creatinine, Ser 8.69 (H) 0.76 - 1.27 mg/dL   eGFR 59 (L) >40 fO/fpw/8.26   BUN/Creatinine Ratio 23 10 - 24   Sodium 142 134 - 144 mmol/L   Potassium 4.6 3.5 - 5.2 mmol/L   Chloride 105 96 - 106 mmol/L   CO2 20 20 - 29 mmol/L   Calcium  9.4 8.6 - 10.2 mg/dL   Total Protein 6.4 6.0 - 8.5 g/dL   Albumin 4.4 3.9 - 4.9 g/dL   Globulin, Total 2.0 1.5 - 4.5 g/dL   Bilirubin Total 0.3 0.0 - 1.2 mg/dL   Alkaline Phosphatase 106 44 - 121 IU/L   AST 38 0 - 40 IU/L   ALT 27 0 - 44 IU/L  CBC with Differential/Platelet   Collection Time: 05/10/23  3:02 PM  Result Value Ref Range   WBC 10.0 3.4 - 10.8 x10E3/uL   RBC 4.39 4.14 - 5.80 x10E6/uL   Hemoglobin 13.6 13.0 - 17.7 g/dL   Hematocrit 59.6 62.4 - 51.0 %   MCV 92 79 - 97 fL   MCH 31.0 26.6 - 33.0 pg   MCHC 33.7 31.5 - 35.7 g/dL   RDW 86.7 88.3 - 84.5 %   Platelets 244 150 - 450 x10E3/uL   Neutrophils 82 Not Estab. %   Lymphs 11 Not Estab. %   Monocytes 6 Not Estab. %   Eos 1 Not Estab. %   Basos 0 Not Estab. %   Neutrophils Absolute 8.0 (H) 1.4 - 7.0 x10E3/uL   Lymphocytes Absolute 1.1 0.7 - 3.1 x10E3/uL   Monocytes Absolute 0.6 0.1 - 0.9 x10E3/uL   EOS (ABSOLUTE) 0.1 0.0 - 0.4 x10E3/uL   Basophils Absolute 0.0 0.0 - 0.2 x10E3/uL   Immature Granulocytes 0 Not  Estab. %   Immature Grans (Abs) 0.0 0.0 - 0.1 x10E3/uL  Lipid Panel w/o Chol/HDL Ratio   Collection Time: 05/10/23  3:02 PM  Result Value Ref Range   Cholesterol, Total 188 100 - 199 mg/dL   Triglycerides 803 (H) 0 - 149 mg/dL   HDL 35 (L) >60 mg/dL   VLDL Cholesterol Cal 35 5 - 40 mg/dL   LDL Chol Calc (NIH) 881 (H) 0 - 99 mg/dL  PSA   Collection Time: 05/10/23  3:02 PM  Result Value Ref Range   Prostate Specific Ag, Serum 0.6 0.0 - 4.0 ng/mL  TSH   Collection Time: 05/10/23  3:02 PM  Result Value Ref Range   TSH 1.330 0.450 - 4.500 uIU/mL  Lyme Disease Serology w/Reflex   Collection Time: 05/10/23  3:02 PM  Result  Value Ref Range   Lyme Total Antibody EIA Negative Negative  Spotted Fever Group Antibodies   Collection Time: 05/10/23  3:02 PM  Result Value Ref Range   Spotted Fever Group IgG <1:64 Neg:<1:64   Spotted Fever Group IgM 1:128 (H) Neg:<1:64   Result Comment Comment   VITAMIN D  25 Hydroxy (Vit-D Deficiency, Fractures)   Collection Time: 05/10/23  3:02 PM  Result Value Ref Range   Vit D, 25-Hydroxy 37.0 30.0 - 100.0 ng/mL      Assessment & Plan:   Problem List Items Addressed This Visit       Endocrine   Controlled type 2 diabetes mellitus with complication, without long-term current use of insulin (HCC)   A1c 6.7. Given recent NSTEMI will start him on ozempic  for cardioprotection. Sample given today and instructed on how to use it. Follow up 1 month. Call with any concerns.       Relevant Medications   rosuvastatin  (CRESTOR ) 40 MG tablet   Semaglutide ,0.25 or 0.5MG /DOS, (OZEMPIC , 0.25 OR 0.5 MG/DOSE,) 2 MG/3ML SOPN   Semaglutide ,0.25 or 0.5MG /DOS, (OZEMPIC , 0.25 OR 0.5 MG/DOSE,) 2 MG/3ML SOPN     Other   Pure hypercholesterolemia   Meds recently increased to crestor  40- too soon to recheck labs. Will recheck them next time. Continue current regimen.       Relevant Medications   rosuvastatin  (CRESTOR ) 40 MG tablet   Other Visit Diagnoses        NSTEMI (non-ST elevated myocardial infarction) (HCC)    -  Primary   Will get him into local cardiac rehab so he doesn't have to drive. Follow up with cardiology as needed. Keep BP, cholesterol and sugars under good control.   Relevant Medications   rosuvastatin  (CRESTOR ) 40 MG tablet   Other Relevant Orders   Amb Referral to Cardiac Rehabilitation        Follow up plan: Return in about 4 weeks (around 09/08/2023).  >25 minutes spent with patient today

## 2023-08-17 DIAGNOSIS — Z09 Encounter for follow-up examination after completed treatment for conditions other than malignant neoplasm: Secondary | ICD-10-CM | POA: Diagnosis not present

## 2023-08-17 DIAGNOSIS — I77810 Thoracic aortic ectasia: Secondary | ICD-10-CM | POA: Diagnosis not present

## 2023-08-17 DIAGNOSIS — I5020 Unspecified systolic (congestive) heart failure: Secondary | ICD-10-CM | POA: Diagnosis not present

## 2023-08-17 DIAGNOSIS — E7849 Other hyperlipidemia: Secondary | ICD-10-CM | POA: Diagnosis not present

## 2023-08-17 DIAGNOSIS — I959 Hypotension, unspecified: Secondary | ICD-10-CM | POA: Diagnosis not present

## 2023-08-17 DIAGNOSIS — I214 Non-ST elevation (NSTEMI) myocardial infarction: Secondary | ICD-10-CM | POA: Diagnosis not present

## 2023-08-17 DIAGNOSIS — Z9861 Coronary angioplasty status: Secondary | ICD-10-CM | POA: Diagnosis not present

## 2023-08-17 DIAGNOSIS — R9431 Abnormal electrocardiogram [ECG] [EKG]: Secondary | ICD-10-CM | POA: Diagnosis not present

## 2023-08-24 ENCOUNTER — Ambulatory Visit (INDEPENDENT_AMBULATORY_CARE_PROVIDER_SITE_OTHER): Admitting: Family Medicine

## 2023-08-24 ENCOUNTER — Encounter: Payer: Self-pay | Admitting: Family Medicine

## 2023-08-24 VITALS — BP 95/66 | HR 78 | Temp 97.6°F | Resp 17 | Ht 72.01 in | Wt 205.0 lb

## 2023-08-24 DIAGNOSIS — I952 Hypotension due to drugs: Secondary | ICD-10-CM

## 2023-08-24 NOTE — Patient Instructions (Signed)
 STOP LISINOPRIL

## 2023-08-24 NOTE — Progress Notes (Signed)
 BP 95/66 (BP Location: Left Arm, Patient Position: Sitting, Cuff Size: Normal)   Pulse 78   Temp 97.6 F (36.4 C) (Oral)   Resp 17   Ht 6' 0.01 (1.829 m)   Wt 205 lb (93 kg)   SpO2 95%   BMI 27.80 kg/m    Subjective:    Patient ID: Luke [name], male    DOB: 1950/06/10, 73 y.o.   MRN: [id number]  HPI: Luke [name] is a 73 y.o. male  Chief Complaint  Patient presents with   Hypotension    Complaining of low blood pressure, dizziness and feeling very weak.    HYPOTENSION- started on lisinopril by cardiology last week, has been dizzy and tired and not feeling well  Hypertension status: overtreated  Satisfied with current treatment? no Duration of hypertension: chronic BP monitoring frequency:  rarely BP range: 90s/50s Medication compliance: excellent compliance Previous BP meds:lisinopril Aspirin : no Recurrent headaches: no Visual changes: no Palpitations: yes Dyspnea: no Chest pain: no Lower extremity edema: no Dizzy/lightheaded: yes   Relevant past medical, surgical, family and social history reviewed and updated as indicated. Interim medical history since our last visit reviewed. Allergies and medications reviewed and updated.  Review of Systems  Constitutional: Negative.   Respiratory: Negative.    Cardiovascular: Negative.   Musculoskeletal: Negative.   Neurological:  Positive for dizziness, weakness and light-headedness. Negative for tremors, seizures, syncope, facial asymmetry, speech difficulty, numbness and headaches.  Psychiatric/Behavioral: Negative.      Per HPI unless specifically indicated above     Objective:    BP 95/66 (BP Location: Left Arm, Patient Position: Sitting, Cuff Size: Normal)   Pulse 78   Temp 97.6 F (36.4 C) (Oral)   Resp 17   Ht 6' 0.01 (1.829 m)   Wt 205 lb (93 kg)   SpO2 95%   BMI 27.80 kg/m   Wt Readings from Last 3 Encounters:  08/24/23 205 lb (93 kg)  08/11/23 207 lb 6.4 oz (94.1 kg)  04/26/23 212 lb 12.8  oz (96.5 kg)    Physical Exam Vitals and nursing note reviewed.  Constitutional:      General: He is not in acute distress.    Appearance: Normal appearance. He is not ill-appearing, toxic-appearing or diaphoretic.  HENT:     Head: Normocephalic and atraumatic.     Right Ear: External ear normal.     Left Ear: External ear normal.     Nose: Nose normal.     Mouth/Throat:     Mouth: Mucous membranes are moist.     Pharynx: Oropharynx is clear.  Eyes:     General: No scleral icterus.       Right eye: No discharge.        Left eye: No discharge.     Extraocular Movements: Extraocular movements intact.     Conjunctiva/sclera: Conjunctivae normal.     Pupils: Pupils are equal, round, and reactive to light.  Cardiovascular:     Rate and Rhythm: Normal rate and regular rhythm.     Pulses: Normal pulses.     Heart sounds: Normal heart sounds. No murmur heard.    No friction rub. No gallop.  Pulmonary:     Effort: Pulmonary effort is normal. No respiratory distress.     Breath sounds: Normal breath sounds. No stridor. No wheezing, rhonchi or rales.  Chest:     Chest wall: No tenderness.  Musculoskeletal:        General: Normal  range of motion.     Cervical back: Normal range of motion and neck supple.  Skin:    General: Skin is warm and dry.     Capillary Refill: Capillary refill takes less than 2 seconds.     Coloration: Skin is not jaundiced or pale.     Findings: No bruising, erythema, lesion or rash.  Neurological:     General: No focal deficit present.     Mental Status: He is alert and oriented to person, place, and time. Mental status is at baseline.  Psychiatric:        Mood and Affect: Mood normal.        Behavior: Behavior normal.        Thought Content: Thought content normal.        Judgment: Judgment normal.     Results for orders placed or performed in visit on 05/10/23  Bayer DCA Hb A1c Waived   Collection Time: 05/10/23  3:01 PM  Result Value Ref Range    HB A1C (BAYER DCA - WAIVED) 6.7 (H) 4.8 - 5.6 %  Microalbumin, Urine Waived   Collection Time: 05/10/23  3:01 PM  Result Value Ref Range   Microalb, Ur Waived 80 (H) 0 - 19 mg/L   Creatinine, Urine Waived 300 10 - 300 mg/dL   Microalb/Creat Ratio 30-300 (H) <30 mg/g  Comprehensive metabolic panel with GFR   Collection Time: 05/10/23  3:02 PM  Result Value Ref Range   Glucose 133 (H) 70 - 99 mg/dL   BUN 30 (H) 8 - 27 mg/dL   Creatinine, Ser 8.69 (H) 0.76 - 1.27 mg/dL   eGFR 59 (L) >40 fO/fpw/8.26   BUN/Creatinine Ratio 23 10 - 24   Sodium 142 134 - 144 mmol/L   Potassium 4.6 3.5 - 5.2 mmol/L   Chloride 105 96 - 106 mmol/L   CO2 20 20 - 29 mmol/L   Calcium  9.4 8.6 - 10.2 mg/dL   Total Protein 6.4 6.0 - 8.5 g/dL   Albumin 4.4 3.9 - 4.9 g/dL   Globulin, Total 2.0 1.5 - 4.5 g/dL   Bilirubin Total 0.3 0.0 - 1.2 mg/dL   Alkaline Phosphatase 106 44 - 121 IU/L   AST 38 0 - 40 IU/L   ALT 27 0 - 44 IU/L  CBC with Differential/Platelet   Collection Time: 05/10/23  3:02 PM  Result Value Ref Range   WBC 10.0 3.4 - 10.8 x10E3/uL   RBC 4.39 4.14 - 5.80 x10E6/uL   Hemoglobin 13.6 13.0 - 17.7 g/dL   Hematocrit 59.6 62.4 - 51.0 %   MCV 92 79 - 97 fL   MCH 31.0 26.6 - 33.0 pg   MCHC 33.7 31.5 - 35.7 g/dL   RDW 86.7 88.3 - 84.5 %   Platelets 244 150 - 450 x10E3/uL   Neutrophils 82 Not Estab. %   Lymphs 11 Not Estab. %   Monocytes 6 Not Estab. %   Eos 1 Not Estab. %   Basos 0 Not Estab. %   Neutrophils Absolute 8.0 (H) 1.4 - 7.0 x10E3/uL   Lymphocytes Absolute 1.1 0.7 - 3.1 x10E3/uL   Monocytes Absolute 0.6 0.1 - 0.9 x10E3/uL   EOS (ABSOLUTE) 0.1 0.0 - 0.4 x10E3/uL   Basophils Absolute 0.0 0.0 - 0.2 x10E3/uL   Immature Granulocytes 0 Not Estab. %   Immature Grans (Abs) 0.0 0.0 - 0.1 x10E3/uL  Lipid Panel w/o Chol/HDL Ratio   Collection Time: 05/10/23  3:02 PM  Result  Value Ref Range   Cholesterol, Total 188 100 - 199 mg/dL   Triglycerides 803 (H) 0 - 149 mg/dL   HDL 35 (L) >60 mg/dL    VLDL Cholesterol Cal 35 5 - 40 mg/dL   LDL Chol Calc (NIH) 881 (H) 0 - 99 mg/dL  PSA   Collection Time: 05/10/23  3:02 PM  Result Value Ref Range   Prostate Specific Ag, Serum 0.6 0.0 - 4.0 ng/mL  TSH   Collection Time: 05/10/23  3:02 PM  Result Value Ref Range   TSH 1.330 0.450 - 4.500 uIU/mL  Lyme Disease Serology w/Reflex   Collection Time: 05/10/23  3:02 PM  Result Value Ref Range   Lyme Total Antibody EIA Negative Negative  Spotted Fever Group Antibodies   Collection Time: 05/10/23  3:02 PM  Result Value Ref Range   Spotted Fever Group IgG <1:64 Neg:<1:64   Spotted Fever Group IgM 1:128 (H) Neg:<1:64   Result Comment Comment   VITAMIN D  25 Hydroxy (Vit-D Deficiency, Fractures)   Collection Time: 05/10/23  3:02 PM  Result Value Ref Range   Vit D, 25-Hydroxy 37.0 30.0 - 100.0 ng/mL      Assessment & Plan:   Problem List Items Addressed This Visit   None Visit Diagnoses       Hypotension due to drugs    -  Primary   Stop lisinopril. Recheck next week. Call with any concerns.        Follow up plan: Return in about 1 week (around 08/31/2023).

## 2023-08-25 DIAGNOSIS — R491 Aphonia: Secondary | ICD-10-CM | POA: Diagnosis not present

## 2023-08-25 DIAGNOSIS — E038 Other specified hypothyroidism: Secondary | ICD-10-CM | POA: Diagnosis not present

## 2023-08-25 DIAGNOSIS — Z9002 Acquired absence of larynx: Secondary | ICD-10-CM | POA: Diagnosis not present

## 2023-08-25 DIAGNOSIS — F17210 Nicotine dependence, cigarettes, uncomplicated: Secondary | ICD-10-CM | POA: Diagnosis not present

## 2023-08-25 DIAGNOSIS — C329 Malignant neoplasm of larynx, unspecified: Secondary | ICD-10-CM | POA: Diagnosis not present

## 2023-08-25 DIAGNOSIS — Z923 Personal history of irradiation: Secondary | ICD-10-CM | POA: Diagnosis not present

## 2023-08-25 DIAGNOSIS — I252 Old myocardial infarction: Secondary | ICD-10-CM | POA: Diagnosis not present

## 2023-08-25 DIAGNOSIS — Z8521 Personal history of malignant neoplasm of larynx: Secondary | ICD-10-CM | POA: Diagnosis not present

## 2023-08-25 DIAGNOSIS — Z08 Encounter for follow-up examination after completed treatment for malignant neoplasm: Secondary | ICD-10-CM | POA: Diagnosis not present

## 2023-08-25 DIAGNOSIS — Z87891 Personal history of nicotine dependence: Secondary | ICD-10-CM | POA: Diagnosis not present

## 2023-08-25 DIAGNOSIS — I6523 Occlusion and stenosis of bilateral carotid arteries: Secondary | ICD-10-CM | POA: Diagnosis not present

## 2023-08-26 ENCOUNTER — Encounter: Payer: Self-pay | Admitting: Family Medicine

## 2023-08-30 DIAGNOSIS — R491 Aphonia: Secondary | ICD-10-CM | POA: Diagnosis not present

## 2023-08-30 DIAGNOSIS — Z9002 Acquired absence of larynx: Secondary | ICD-10-CM | POA: Diagnosis not present

## 2023-09-06 ENCOUNTER — Ambulatory Visit (INDEPENDENT_AMBULATORY_CARE_PROVIDER_SITE_OTHER): Admitting: Family Medicine

## 2023-09-06 ENCOUNTER — Encounter: Payer: Self-pay | Admitting: Family Medicine

## 2023-09-06 VITALS — BP 121/85 | HR 93 | Temp 97.8°F | Ht 72.0 in | Wt 201.2 lb

## 2023-09-06 DIAGNOSIS — R5383 Other fatigue: Secondary | ICD-10-CM

## 2023-09-06 DIAGNOSIS — I959 Hypotension, unspecified: Secondary | ICD-10-CM

## 2023-09-06 DIAGNOSIS — Z8572 Personal history of non-Hodgkin lymphomas: Secondary | ICD-10-CM

## 2023-09-06 MED ORDER — MIDODRINE HCL 2.5 MG PO TABS: 2.5000 mg | ORAL_TABLET | Freq: Two times a day (BID) | ORAL | 1 refills | Status: DC

## 2023-09-06 MED ORDER — LEVOTHYROXINE SODIUM 100 MCG PO TABS: 100.0000 ug | ORAL_TABLET | Freq: Every day | ORAL | Status: DC

## 2023-09-06 NOTE — Progress Notes (Signed)
 BP 121/85   Pulse 93   Temp 97.8 F (36.6 C) (Oral)   Ht 6' (1.829 m)   Wt 201 lb 3.2 oz (91.3 kg)   SpO2 99%   BMI 27.29 kg/m    Subjective:    Patient ID: Luke [name], male    DOB: 1950-10-10, 73 y.o.   MRN: [id number]  HPI: Luke [name] is a 73 y.o. male  Chief Complaint  Patient presents with   Fatigue   Hypotension   Dizziness   HYPOTENSION  Hypotension status: still running low  Satisfied with current treatment? no BP monitoring frequency:  a few times a day BP range: 80s/50s BP medication side effects:  yes Medication compliance: not on anything Previous BP meds:lisinopril Aspirin : no Recurrent headaches: no Visual changes: no Palpitations: yes Dyspnea: no Chest pain: no Lower extremity edema: no Dizzy/lightheaded: yes   Relevant past medical, surgical, family and social history reviewed and updated as indicated. Interim medical history since our last visit reviewed. Allergies and medications reviewed and updated.  Review of Systems  Constitutional:  Positive for fatigue. Negative for activity change, appetite change, chills, diaphoresis, fever and unexpected weight change.  Respiratory: Negative.    Cardiovascular: Negative.   Gastrointestinal: Negative.   Neurological:  Positive for dizziness and light-headedness. Negative for tremors, seizures, syncope, facial asymmetry, speech difficulty, weakness, numbness and headaches.  Psychiatric/Behavioral: Negative.      Per HPI unless specifically indicated above     Objective:    BP 121/85   Pulse 93   Temp 97.8 F (36.6 C) (Oral)   Ht 6' (1.829 m)   Wt 201 lb 3.2 oz (91.3 kg)   SpO2 99%   BMI 27.29 kg/m   Wt Readings from Last 3 Encounters:  09/06/23 201 lb 3.2 oz (91.3 kg)  08/24/23 205 lb (93 kg)  08/11/23 207 lb 6.4 oz (94.1 kg)    Physical Exam Vitals and nursing note reviewed.  Constitutional:      General: He is not in acute distress.    Appearance: Normal appearance. He is  not ill-appearing, toxic-appearing or diaphoretic.  HENT:     Head: Normocephalic and atraumatic.     Right Ear: External ear normal.     Left Ear: External ear normal.     Nose: Nose normal.     Mouth/Throat:     Mouth: Mucous membranes are moist.     Pharynx: Oropharynx is clear.  Eyes:     General: No scleral icterus.       Right eye: No discharge.        Left eye: No discharge.     Extraocular Movements: Extraocular movements intact.     Conjunctiva/sclera: Conjunctivae normal.     Pupils: Pupils are equal, round, and reactive to light.  Cardiovascular:     Rate and Rhythm: Normal rate and regular rhythm.     Pulses: Normal pulses.     Heart sounds: Normal heart sounds. No murmur heard.    No friction rub. No gallop.  Pulmonary:     Effort: Pulmonary effort is normal. No respiratory distress.     Breath sounds: Normal breath sounds. No stridor. No wheezing, rhonchi or rales.  Chest:     Chest wall: No tenderness.  Musculoskeletal:        General: Normal range of motion.     Cervical back: Normal range of motion and neck supple.  Skin:    General: Skin is warm and  dry.     Capillary Refill: Capillary refill takes less than 2 seconds.     Coloration: Skin is not jaundiced or pale.     Findings: No bruising, erythema, lesion or rash.  Neurological:     General: No focal deficit present.     Mental Status: He is alert and oriented to person, place, and time. Mental status is at baseline.  Psychiatric:        Mood and Affect: Mood normal.        Behavior: Behavior normal.        Thought Content: Thought content normal.        Judgment: Judgment normal.     Results for orders placed or performed in visit on 05/10/23  Bayer DCA Hb A1c Waived   Collection Time: 05/10/23  3:01 PM  Result Value Ref Range   HB A1C (BAYER DCA - WAIVED) 6.7 (H) 4.8 - 5.6 %  Microalbumin, Urine Waived   Collection Time: 05/10/23  3:01 PM  Result Value Ref Range   Microalb, Ur Waived 80 (H)  0 - 19 mg/L   Creatinine, Urine Waived 300 10 - 300 mg/dL   Microalb/Creat Ratio 30-300 (H) <30 mg/g  Comprehensive metabolic panel with GFR   Collection Time: 05/10/23  3:02 PM  Result Value Ref Range   Glucose 133 (H) 70 - 99 mg/dL   BUN 30 (H) 8 - 27 mg/dL   Creatinine, Ser 8.69 (H) 0.76 - 1.27 mg/dL   eGFR 59 (L) >40 fO/fpw/8.26   BUN/Creatinine Ratio 23 10 - 24   Sodium 142 134 - 144 mmol/L   Potassium 4.6 3.5 - 5.2 mmol/L   Chloride 105 96 - 106 mmol/L   CO2 20 20 - 29 mmol/L   Calcium  9.4 8.6 - 10.2 mg/dL   Total Protein 6.4 6.0 - 8.5 g/dL   Albumin 4.4 3.9 - 4.9 g/dL   Globulin, Total 2.0 1.5 - 4.5 g/dL   Bilirubin Total 0.3 0.0 - 1.2 mg/dL   Alkaline Phosphatase 106 44 - 121 IU/L   AST 38 0 - 40 IU/L   ALT 27 0 - 44 IU/L  CBC with Differential/Platelet   Collection Time: 05/10/23  3:02 PM  Result Value Ref Range   WBC 10.0 3.4 - 10.8 x10E3/uL   RBC 4.39 4.14 - 5.80 x10E6/uL   Hemoglobin 13.6 13.0 - 17.7 g/dL   Hematocrit 59.6 62.4 - 51.0 %   MCV 92 79 - 97 fL   MCH 31.0 26.6 - 33.0 pg   MCHC 33.7 31.5 - 35.7 g/dL   RDW 86.7 88.3 - 84.5 %   Platelets 244 150 - 450 x10E3/uL   Neutrophils 82 Not Estab. %   Lymphs 11 Not Estab. %   Monocytes 6 Not Estab. %   Eos 1 Not Estab. %   Basos 0 Not Estab. %   Neutrophils Absolute 8.0 (H) 1.4 - 7.0 x10E3/uL   Lymphocytes Absolute 1.1 0.7 - 3.1 x10E3/uL   Monocytes Absolute 0.6 0.1 - 0.9 x10E3/uL   EOS (ABSOLUTE) 0.1 0.0 - 0.4 x10E3/uL   Basophils Absolute 0.0 0.0 - 0.2 x10E3/uL   Immature Granulocytes 0 Not Estab. %   Immature Grans (Abs) 0.0 0.0 - 0.1 x10E3/uL  Lipid Panel w/o Chol/HDL Ratio   Collection Time: 05/10/23  3:02 PM  Result Value Ref Range   Cholesterol, Total 188 100 - 199 mg/dL   Triglycerides 803 (H) 0 - 149 mg/dL   HDL 35 (L) >  39 mg/dL   VLDL Cholesterol Cal 35 5 - 40 mg/dL   LDL Chol Calc (NIH) 881 (H) 0 - 99 mg/dL  PSA   Collection Time: 05/10/23  3:02 PM  Result Value Ref Range   Prostate  Specific Ag, Serum 0.6 0.0 - 4.0 ng/mL  TSH   Collection Time: 05/10/23  3:02 PM  Result Value Ref Range   TSH 1.330 0.450 - 4.500 uIU/mL  Lyme Disease Serology w/Reflex   Collection Time: 05/10/23  3:02 PM  Result Value Ref Range   Lyme Total Antibody EIA Negative Negative  Spotted Fever Group Antibodies   Collection Time: 05/10/23  3:02 PM  Result Value Ref Range   Spotted Fever Group IgG <1:64 Neg:<1:64   Spotted Fever Group IgM 1:128 (H) Neg:<1:64   Result Comment Comment   VITAMIN D  25 Hydroxy (Vit-D Deficiency, Fractures)   Collection Time: 05/10/23  3:02 PM  Result Value Ref Range   Vit D, 25-Hydroxy 37.0 30.0 - 100.0 ng/mL      Assessment & Plan:   Problem List Items Addressed This Visit       Other   H/O lymphoma   Relevant Medications   levothyroxine  (SYNTHROID ) 100 MCG tablet   Other Visit Diagnoses       Hypotension, unspecified hypotension type    -  Primary   BP remains low not on medication. Will check labs and start midodrine . Recheck 2 weeks. Call with any concens.   Relevant Medications   midodrine  (PROAMATINE ) 2.5 MG tablet     Other fatigue       BP remains low not on medication. Will check labs and start midodrine . Recheck 2 weeks. Call with any concens.   Relevant Orders   CBC with Differential/Platelet   Comprehensive metabolic panel with GFR   B12   Magnesium   Phosphorus   VITAMIN D  25 Hydroxy (Vit-D Deficiency, Fractures)        Follow up plan: Return in about 2 weeks (around 09/20/2023) for OK to use same day.

## 2023-09-07 ENCOUNTER — Ambulatory Visit: Payer: Self-pay | Admitting: Family Medicine

## 2023-09-07 LAB — CBC WITH DIFFERENTIAL/PLATELET
Basophils Absolute: 0 x10E3/uL (ref 0.0–0.2)
Basos: 0 %
EOS (ABSOLUTE): 0.2 x10E3/uL (ref 0.0–0.4)
Eos: 3 %
Hematocrit: 40.8 % (ref 37.5–51.0)
Hemoglobin: 13.6 g/dL (ref 13.0–17.7)
Immature Grans (Abs): 0 x10E3/uL (ref 0.0–0.1)
Immature Granulocytes: 0 %
Lymphocytes Absolute: 1.1 x10E3/uL (ref 0.7–3.1)
Lymphs: 18 %
MCH: 31.1 pg (ref 26.6–33.0)
MCHC: 33.3 g/dL (ref 31.5–35.7)
MCV: 93 fL (ref 79–97)
Monocytes Absolute: 0.3 x10E3/uL (ref 0.1–0.9)
Monocytes: 5 %
Neutrophils Absolute: 4.7 x10E3/uL (ref 1.4–7.0)
Neutrophils: 74 %
Platelets: 253 x10E3/uL (ref 150–450)
RBC: 4.38 x10E6/uL (ref 4.14–5.80)
RDW: 12.4 % (ref 11.6–15.4)
WBC: 6.4 x10E3/uL (ref 3.4–10.8)

## 2023-09-07 LAB — PHOSPHORUS: Phosphorus: 3.8 mg/dL (ref 2.8–4.1)

## 2023-09-07 LAB — VITAMIN B12: Vitamin B-12: 687 pg/mL (ref 232–1245)

## 2023-09-07 LAB — COMPREHENSIVE METABOLIC PANEL WITH GFR
ALT: 31 IU/L (ref 0–44)
AST: 34 IU/L (ref 0–40)
Albumin: 4.2 g/dL (ref 3.8–4.8)
Alkaline Phosphatase: 101 IU/L (ref 44–121)
BUN/Creatinine Ratio: 21 (ref 10–24)
BUN: 25 mg/dL (ref 8–27)
Bilirubin Total: 0.4 mg/dL (ref 0.0–1.2)
CO2: 24 mmol/L (ref 20–29)
Calcium: 9.6 mg/dL (ref 8.6–10.2)
Chloride: 102 mmol/L (ref 96–106)
Creatinine, Ser: 1.21 mg/dL (ref 0.76–1.27)
Globulin, Total: 2.1 g/dL (ref 1.5–4.5)
Glucose: 135 mg/dL — ABNORMAL HIGH (ref 70–99)
Potassium: 4.1 mmol/L (ref 3.5–5.2)
Sodium: 141 mmol/L (ref 134–144)
Total Protein: 6.3 g/dL (ref 6.0–8.5)
eGFR: 64 mL/min/1.73 (ref >59)

## 2023-09-07 LAB — MAGNESIUM: Magnesium: 1.8 mg/dL (ref 1.6–2.3)

## 2023-09-07 LAB — VITAMIN D 25 HYDROXY (VIT D DEFICIENCY, FRACTURES): Vit D, 25-Hydroxy: 35.3 ng/mL (ref 30.0–100.0)

## 2023-09-13 ENCOUNTER — Ambulatory Visit: Admitting: Family Medicine

## 2023-09-16 DIAGNOSIS — I25119 Atherosclerotic heart disease of native coronary artery with unspecified angina pectoris: Secondary | ICD-10-CM | POA: Diagnosis not present

## 2023-09-16 DIAGNOSIS — I959 Hypotension, unspecified: Secondary | ICD-10-CM | POA: Diagnosis not present

## 2023-09-16 DIAGNOSIS — Z9861 Coronary angioplasty status: Secondary | ICD-10-CM | POA: Diagnosis not present

## 2023-09-16 DIAGNOSIS — E7849 Other hyperlipidemia: Secondary | ICD-10-CM | POA: Diagnosis not present

## 2023-09-16 DIAGNOSIS — I77810 Thoracic aortic ectasia: Secondary | ICD-10-CM | POA: Diagnosis not present

## 2023-09-16 DIAGNOSIS — I5020 Unspecified systolic (congestive) heart failure: Secondary | ICD-10-CM | POA: Diagnosis not present

## 2023-09-16 DIAGNOSIS — R9431 Abnormal electrocardiogram [ECG] [EKG]: Secondary | ICD-10-CM | POA: Diagnosis not present

## 2023-09-16 DIAGNOSIS — I214 Non-ST elevation (NSTEMI) myocardial infarction: Secondary | ICD-10-CM | POA: Diagnosis not present

## 2023-09-20 ENCOUNTER — Encounter: Payer: Self-pay | Admitting: Family Medicine

## 2023-09-20 ENCOUNTER — Ambulatory Visit: Admitting: Family Medicine

## 2023-09-20 VITALS — BP 112/76 | HR 75 | Temp 98.0°F | Ht 72.0 in | Wt 208.4 lb

## 2023-09-20 DIAGNOSIS — R5382 Chronic fatigue, unspecified: Secondary | ICD-10-CM

## 2023-09-20 DIAGNOSIS — Z8572 Personal history of non-Hodgkin lymphomas: Secondary | ICD-10-CM | POA: Diagnosis not present

## 2023-09-20 DIAGNOSIS — I959 Hypotension, unspecified: Secondary | ICD-10-CM

## 2023-09-20 DIAGNOSIS — Z23 Encounter for immunization: Secondary | ICD-10-CM

## 2023-09-20 MED ORDER — CYANOCOBALAMIN 1000 MCG/ML IJ SOLN
1000.0000 ug | Freq: Once | INTRAMUSCULAR | Status: AC
  Administered 2023-09-20: 1000 ug via INTRAMUSCULAR

## 2023-09-20 NOTE — Progress Notes (Signed)
 BP 112/76 (BP Location: Left Arm, Patient Position: Sitting, Cuff Size: Normal)   Pulse 75   Temp 98 F (36.7 C)   Ht 6' (1.829 m)   Wt 208 lb 6.4 oz (94.5 kg)   SpO2 95%   BMI 28.26 kg/m    Subjective:    Patient ID: Luke [name], male    DOB: 12/02/1950, 73 y.o.   MRN: [id number]  HPI: Luke [name] is a 73 y.o. male  Chief Complaint  Patient presents with   Hypotension    2 week fo/u from low blood pressure. No questions or concerns     HYPOTENSION  Hypotension status:   Satisfied with current treatment? no Duration of hypotension: weeks BP monitoring frequency:  a few times a day BP range: 70s to 170s systolic BP medication side effects:  no Medication compliance: good compliance Aspirin : no Recurrent headaches: no Visual changes: yes Palpitations: no Dyspnea: no Chest pain: no Lower extremity edema: no Dizzy/lightheaded: yes  Relevant past medical, surgical, family and social history reviewed and updated as indicated. Interim medical history since our last visit reviewed. Allergies and medications reviewed and updated.  Review of Systems  Constitutional:  Positive for fatigue. Negative for activity change, appetite change, chills, diaphoresis, fever and unexpected weight change.  Respiratory: Negative.    Cardiovascular: Negative.   Neurological:  Positive for dizziness and light-headedness. Negative for tremors, seizures, syncope, facial asymmetry, speech difficulty, weakness, numbness and headaches.  Psychiatric/Behavioral: Negative.      Per HPI unless specifically indicated above     Objective:    BP 112/76 (BP Location: Left Arm, Patient Position: Sitting, Cuff Size: Normal)   Pulse 75   Temp 98 F (36.7 C)   Ht 6' (1.829 m)   Wt 208 lb 6.4 oz (94.5 kg)   SpO2 95%   BMI 28.26 kg/m   Wt Readings from Last 3 Encounters:  09/20/23 208 lb 6.4 oz (94.5 kg)  09/06/23 201 lb 3.2 oz (91.3 kg)  08/24/23 205 lb (93 kg)    Physical  Exam Vitals and nursing note reviewed.  Constitutional:      General: He is not in acute distress.    Appearance: Normal appearance. He is not ill-appearing, toxic-appearing or diaphoretic.  HENT:     Head: Normocephalic and atraumatic.     Right Ear: External ear normal.     Left Ear: External ear normal.     Nose: Nose normal.     Mouth/Throat:     Mouth: Mucous membranes are moist.     Pharynx: Oropharynx is clear.  Eyes:     General: No scleral icterus.       Right eye: No discharge.        Left eye: No discharge.     Extraocular Movements: Extraocular movements intact.     Conjunctiva/sclera: Conjunctivae normal.     Pupils: Pupils are equal, round, and reactive to light.  Cardiovascular:     Rate and Rhythm: Normal rate and regular rhythm.     Pulses: Normal pulses.     Heart sounds: Normal heart sounds. No murmur heard.    No friction rub. No gallop.  Pulmonary:     Effort: Pulmonary effort is normal. No respiratory distress.     Breath sounds: Normal breath sounds. No stridor. No wheezing, rhonchi or rales.  Chest:     Chest wall: No tenderness.  Musculoskeletal:        General: Normal range of  motion.     Cervical back: Normal range of motion and neck supple.  Skin:    General: Skin is warm and dry.     Capillary Refill: Capillary refill takes less than 2 seconds.     Coloration: Skin is not jaundiced or pale.     Findings: No bruising, erythema, lesion or rash.  Neurological:     General: No focal deficit present.     Mental Status: He is alert and oriented to person, place, and time. Mental status is at baseline.  Psychiatric:        Mood and Affect: Mood normal.        Behavior: Behavior normal.        Thought Content: Thought content normal.        Judgment: Judgment normal.     Results for orders placed or performed in visit on 09/06/23  CBC with Differential/Platelet   Collection Time: 09/06/23  4:09 PM  Result Value Ref Range   WBC 6.4 3.4 - 10.8  x10E3/uL   RBC 4.38 4.14 - 5.80 x10E6/uL   Hemoglobin 13.6 13.0 - 17.7 g/dL   Hematocrit 59.1 62.4 - 51.0 %   MCV 93 79 - 97 fL   MCH 31.1 26.6 - 33.0 pg   MCHC 33.3 31.5 - 35.7 g/dL   RDW 87.5 88.3 - 84.5 %   Platelets 253 150 - 450 x10E3/uL   Neutrophils 74 Not Estab. %   Lymphs 18 Not Estab. %   Monocytes 5 Not Estab. %   Eos 3 Not Estab. %   Basos 0 Not Estab. %   Neutrophils Absolute 4.7 1.4 - 7.0 x10E3/uL   Lymphocytes Absolute 1.1 0.7 - 3.1 x10E3/uL   Monocytes Absolute 0.3 0.1 - 0.9 x10E3/uL   EOS (ABSOLUTE) 0.2 0.0 - 0.4 x10E3/uL   Basophils Absolute 0.0 0.0 - 0.2 x10E3/uL   Immature Granulocytes 0 Not Estab. %   Immature Grans (Abs) 0.0 0.0 - 0.1 x10E3/uL  Comprehensive metabolic panel with GFR   Collection Time: 09/06/23  4:09 PM  Result Value Ref Range   Glucose 135 (H) 70 - 99 mg/dL   BUN 25 8 - 27 mg/dL   Creatinine, Ser 8.78 0.76 - 1.27 mg/dL   eGFR 64 >40 fO/fpw/8.26   BUN/Creatinine Ratio 21 10 - 24   Sodium 141 134 - 144 mmol/L   Potassium 4.1 3.5 - 5.2 mmol/L   Chloride 102 96 - 106 mmol/L   CO2 24 20 - 29 mmol/L   Calcium  9.6 8.6 - 10.2 mg/dL   Total Protein 6.3 6.0 - 8.5 g/dL   Albumin 4.2 3.8 - 4.8 g/dL   Globulin, Total 2.1 1.5 - 4.5 g/dL   Bilirubin Total 0.4 0.0 - 1.2 mg/dL   Alkaline Phosphatase 101 44 - 121 IU/L   AST 34 0 - 40 IU/L   ALT 31 0 - 44 IU/L  B12   Collection Time: 09/06/23  4:09 PM  Result Value Ref Range   Vitamin B-12 687 232 - 1,245 pg/mL  Magnesium   Collection Time: 09/06/23  4:09 PM  Result Value Ref Range   Magnesium 1.8 1.6 - 2.3 mg/dL  Phosphorus   Collection Time: 09/06/23  4:09 PM  Result Value Ref Range   Phosphorus 3.8 2.8 - 4.1 mg/dL  VITAMIN D  25 Hydroxy (Vit-D Deficiency, Fractures)   Collection Time: 09/06/23  4:09 PM  Result Value Ref Range   Vit D, 25-Hydroxy 35.3 30.0 - 100.0 ng/mL  Assessment & Plan:   Problem List Items Addressed This Visit       Other   H/O lymphoma   Other Visit  Diagnoses       Hypotension, unspecified hypotension type    -  Primary   BP has been really labile. Will refer to nephrology to see if they can help. Concern for fluid shifts. Continue to monitor.   Relevant Orders   Ambulatory referral to Nephrology     Chronic fatigue       Likely multi-factorial. Will try to keep BP better. Continue to monitor. Call with any concerns.   Relevant Medications   cyanocobalamin  (VITAMIN B12) injection 1,000 mcg (Completed)     Needs flu shot       Flu shot given today.   Relevant Orders   Flu vaccine HIGH DOSE PF(Fluzone Trivalent) (Completed)        Follow up plan: Return in about 4 weeks (around 10/18/2023).

## 2023-09-22 ENCOUNTER — Ambulatory Visit: Payer: Self-pay

## 2023-09-22 NOTE — Telephone Encounter (Signed)
 FYI Only or Action Required?: Action required by provider: clinical question for provider. Please contact sister, Izetta Fenton  Patient was last seen in primary care on 09/20/2023 by Vicci Duwaine SQUIBB, DO.  Called Nurse Triage reporting Depression.  Symptoms began about a month ago.  Interventions attempted: Nothing.  Symptoms are: gradually worsening.  Triage Disposition: Home Care  Patient/caregiver understands and will follow disposition?: Yes  Sister Izetta Soman (alternate contact for pt) calling to let us  know that he has been very depressed. She was inquiring about him possibly starting Zoloft  since it is what she is on. [phone] (Mobile)   Reason for Disposition  Depression, questions about  Answer Assessment - Initial Assessment Questions 1. CONCERN: What happened that made you call today?     Patient's sister called to report that she forgot to mention at recent office visit that she has concerns that he is experiencing depression.  She states that she uses zoloft  and would like to know if Dr. Vicci would consider prescribing for him 2. DEPRESSION SYMPTOM SCREENING: How are you feeling overall? (e.g., decreased energy, increased sleeping or difficulty sleeping, difficulty concentrating, feelings of sadness, guilt, hopelessness, or worthlessness)     States that patient does not smile, talk, or leave the house often, has low motivation 3. RISK OF HARM - SUICIDAL IDEATION:  Do you ever have thoughts of hurting or killing yourself?  (e.g., yes, no, no but preoccupation with thoughts about death)     States that he has not expressed suicidal ideation 4. RISK OF HARM - HOMICIDAL IDEATION:  Do you ever have thoughts of hurting or killing someone else?  (e.g., yes, no, no but preoccupation with thoughts about death)     denies 5. FUNCTIONAL IMPAIRMENT: How have things been going for you overall? Have you had more difficulty than usual doing your normal daily  activities?  (e.g., better, same, worse; self-care, school, work, interactions)     same 6. SUPPORT: Who is with you now? Who do you live with? Do you have family or friends who you can talk to?      Wife and family 7. THERAPIST: Do you have a counselor or therapist? If Yes, ask: What is their name?     No therapist 8. STRESSORS: Has there been any new stress or recent changes in your life?     health 9. ALCOHOL USE OR SUBSTANCE USE (DRUG USE): Do you drink alcohol or use any illegal drugs?     denies 10. OTHER: Do you have any other physical symptoms right now? (e.g., fever)       N/a 11. PREGNANCY: Is there any chance you are pregnant? When was your last menstrual period?       N/a  Protocols used: Depression-A-AH

## 2023-09-22 NOTE — Telephone Encounter (Signed)
 LVM for patient's sister to return call to [phone]  Message from Tinnie C sent at 09/22/2023  4:23 PM EDT  Sister Izetta Riggan (alternate contact for pt) calling to let us  know that he has been very depressed. She was inquiring about him possibly starting Zoloft  since it is what she is on. [phone] (Mobile)

## 2023-09-23 NOTE — Telephone Encounter (Signed)
 Forwarding to both admin for assistance in scheduling an appt with patient for depression. Also forwarding to PCP for awareness.

## 2023-09-23 NOTE — Telephone Encounter (Signed)
 Scheduled for 9/15 at 1120am

## 2023-09-26 ENCOUNTER — Encounter: Payer: Self-pay | Admitting: Family Medicine

## 2023-09-26 ENCOUNTER — Ambulatory Visit (INDEPENDENT_AMBULATORY_CARE_PROVIDER_SITE_OTHER): Admitting: Family Medicine

## 2023-09-26 VITALS — BP 90/59 | HR 68 | Ht 73.0 in | Wt 205.8 lb

## 2023-09-26 DIAGNOSIS — F4321 Adjustment disorder with depressed mood: Secondary | ICD-10-CM

## 2023-09-26 MED ORDER — TRAZODONE HCL 100 MG PO TABS: 100.0000 mg | ORAL_TABLET | Freq: Every evening | ORAL | 3 refills | Status: DC | PRN

## 2023-09-26 NOTE — Progress Notes (Signed)
 BP (!) 90/59 (BP Location: Left Arm, Patient Position: Sitting, Cuff Size: Normal)   Pulse 68   Ht 6' 1 (1.854 m)   Wt 205 lb 12.8 oz (93.4 kg)   SpO2 92%   BMI 27.15 kg/m    Subjective:    Patient ID: Luke [name], male    DOB: 08-25-1950, 73 y.o.   MRN: [id number]  HPI: Luke [name] is a 73 y.o. male  Chief Complaint  Patient presents with   Depression   DEPRESSION- has been feeling more tired and down and not like himself for the past week.  Mood status: uncontrolled Satisfied with current treatment?: N/A Symptom severity: moderate  Duration of current treatment : not on anything Side effects: N/A Psychotherapy/counseling: no  Previous psychiatric medications: none Depressed mood: yes Anxious mood: no Anhedonia: yes Significant weight loss or gain: no Insomnia: yes hard to fall asleep and stay asleep Fatigue: yes Feelings of worthlessness or guilt: yes Impaired concentration/indecisiveness: yes Suicidal ideations: no Hopelessness: no Crying spells: no    09/26/2023   11:17 AM 09/20/2023    1:27 PM 08/11/2023    1:55 PM 05/10/2023    2:40 PM 04/26/2023   11:24 AM  Depression screen PHQ 2/9  Decreased Interest 0 1 0 2 0  Down, Depressed, Hopeless 0 1 0 0 0  PHQ - 2 Score 0 2 0 2 0  Altered sleeping 0  0 2 0  Tired, decreased energy 0 3 0 2 0  Change in appetite 0 0 0 0 0  Feeling bad or failure about yourself  0 1 0 0 0  Trouble concentrating 0 0 0 0 0  Moving slowly or fidgety/restless 0 0 0 0 0  Suicidal thoughts 0 0 0 0 0  PHQ-9 Score 0  0 6 0  Difficult doing work/chores  Somewhat difficult  Not difficult at all Not difficult at all    Relevant past medical, surgical, family and social history reviewed and updated as indicated. Interim medical history since our last visit reviewed. Allergies and medications reviewed and updated.  Review of Systems  Constitutional: Negative.   Respiratory: Negative.    Cardiovascular: Negative.    Musculoskeletal: Negative.   Skin: Negative.   Neurological: Negative.   Psychiatric/Behavioral:  Positive for dysphoric mood and sleep disturbance. Negative for agitation, behavioral problems, confusion, decreased concentration, hallucinations, self-injury and suicidal ideas. The patient is not nervous/anxious and is not hyperactive.     Per HPI unless specifically indicated above     Objective:    BP (!) 90/59 (BP Location: Left Arm, Patient Position: Sitting, Cuff Size: Normal)   Pulse 68   Ht 6' 1 (1.854 m)   Wt 205 lb 12.8 oz (93.4 kg)   SpO2 92%   BMI 27.15 kg/m   Wt Readings from Last 3 Encounters:  09/26/23 205 lb 12.8 oz (93.4 kg)  09/20/23 208 lb 6.4 oz (94.5 kg)  09/06/23 201 lb 3.2 oz (91.3 kg)    Physical Exam Vitals and nursing note reviewed.  Constitutional:      General: He is not in acute distress.    Appearance: Normal appearance. He is not ill-appearing, toxic-appearing or diaphoretic.  HENT:     Head: Normocephalic and atraumatic.     Right Ear: External ear normal.     Left Ear: External ear normal.     Nose: Nose normal.     Mouth/Throat:     Mouth: Mucous membranes are moist.  Pharynx: Oropharynx is clear.  Eyes:     General: No scleral icterus.       Right eye: No discharge.        Left eye: No discharge.     Extraocular Movements: Extraocular movements intact.     Conjunctiva/sclera: Conjunctivae normal.     Pupils: Pupils are equal, round, and reactive to light.  Cardiovascular:     Rate and Rhythm: Normal rate and regular rhythm.     Pulses: Normal pulses.     Heart sounds: Normal heart sounds. No murmur heard.    No friction rub. No gallop.  Pulmonary:     Effort: Pulmonary effort is normal. No respiratory distress.     Breath sounds: Normal breath sounds. No stridor. No wheezing, rhonchi or rales.  Chest:     Chest wall: No tenderness.  Musculoskeletal:        General: Normal range of motion.     Cervical back: Normal range of  motion and neck supple.  Skin:    General: Skin is warm and dry.     Capillary Refill: Capillary refill takes less than 2 seconds.     Coloration: Skin is not jaundiced or pale.     Findings: No bruising, erythema, lesion or rash.  Neurological:     General: No focal deficit present.     Mental Status: He is alert and oriented to person, place, and time. Mental status is at baseline.  Psychiatric:        Mood and Affect: Mood normal.        Behavior: Behavior normal.        Thought Content: Thought content normal.        Judgment: Judgment normal.     Results for orders placed or performed in visit on 09/06/23  CBC with Differential/Platelet   Collection Time: 09/06/23  4:09 PM  Result Value Ref Range   WBC 6.4 3.4 - 10.8 x10E3/uL   RBC 4.38 4.14 - 5.80 x10E6/uL   Hemoglobin 13.6 13.0 - 17.7 g/dL   Hematocrit 59.1 62.4 - 51.0 %   MCV 93 79 - 97 fL   MCH 31.1 26.6 - 33.0 pg   MCHC 33.3 31.5 - 35.7 g/dL   RDW 87.5 88.3 - 84.5 %   Platelets 253 150 - 450 x10E3/uL   Neutrophils 74 Not Estab. %   Lymphs 18 Not Estab. %   Monocytes 5 Not Estab. %   Eos 3 Not Estab. %   Basos 0 Not Estab. %   Neutrophils Absolute 4.7 1.4 - 7.0 x10E3/uL   Lymphocytes Absolute 1.1 0.7 - 3.1 x10E3/uL   Monocytes Absolute 0.3 0.1 - 0.9 x10E3/uL   EOS (ABSOLUTE) 0.2 0.0 - 0.4 x10E3/uL   Basophils Absolute 0.0 0.0 - 0.2 x10E3/uL   Immature Granulocytes 0 Not Estab. %   Immature Grans (Abs) 0.0 0.0 - 0.1 x10E3/uL  Comprehensive metabolic panel with GFR   Collection Time: 09/06/23  4:09 PM  Result Value Ref Range   Glucose 135 (H) 70 - 99 mg/dL   BUN 25 8 - 27 mg/dL   Creatinine, Ser 8.78 0.76 - 1.27 mg/dL   eGFR 64 >40 fO/fpw/8.26   BUN/Creatinine Ratio 21 10 - 24   Sodium 141 134 - 144 mmol/L   Potassium 4.1 3.5 - 5.2 mmol/L   Chloride 102 96 - 106 mmol/L   CO2 24 20 - 29 mmol/L   Calcium  9.6 8.6 - 10.2 mg/dL   Total  Protein 6.3 6.0 - 8.5 g/dL   Albumin 4.2 3.8 - 4.8 g/dL   Globulin, Total  2.1 1.5 - 4.5 g/dL   Bilirubin Total 0.4 0.0 - 1.2 mg/dL   Alkaline Phosphatase 101 44 - 121 IU/L   AST 34 0 - 40 IU/L   ALT 31 0 - 44 IU/L  B12   Collection Time: 09/06/23  4:09 PM  Result Value Ref Range   Vitamin B-12 687 232 - 1,245 pg/mL  Magnesium   Collection Time: 09/06/23  4:09 PM  Result Value Ref Range   Magnesium 1.8 1.6 - 2.3 mg/dL  Phosphorus   Collection Time: 09/06/23  4:09 PM  Result Value Ref Range   Phosphorus 3.8 2.8 - 4.1 mg/dL  VITAMIN D  25 Hydroxy (Vit-D Deficiency, Fractures)   Collection Time: 09/06/23  4:09 PM  Result Value Ref Range   Vit D, 25-Hydroxy 35.3 30.0 - 100.0 ng/mL      Assessment & Plan:   Problem List Items Addressed This Visit   None Visit Diagnoses       Adjustment disorder with depressed mood    -  Primary   Started last week. Not feeling like himself- just had his thyroid  adjusted 2 weeks ago and BP running quite low. Seeing nephro on Wednesday. Call with concerns.        Follow up plan: Return for As scheduled.

## 2023-09-27 DIAGNOSIS — E785 Hyperlipidemia, unspecified: Secondary | ICD-10-CM | POA: Insufficient documentation

## 2023-09-27 DIAGNOSIS — I77810 Thoracic aortic ectasia: Secondary | ICD-10-CM | POA: Diagnosis not present

## 2023-09-27 DIAGNOSIS — I358 Other nonrheumatic aortic valve disorders: Secondary | ICD-10-CM | POA: Diagnosis not present

## 2023-09-27 DIAGNOSIS — I517 Cardiomegaly: Secondary | ICD-10-CM | POA: Diagnosis not present

## 2023-09-28 DIAGNOSIS — R809 Proteinuria, unspecified: Secondary | ICD-10-CM | POA: Diagnosis not present

## 2023-09-28 DIAGNOSIS — I959 Hypotension, unspecified: Secondary | ICD-10-CM | POA: Diagnosis not present

## 2023-09-28 DIAGNOSIS — E1129 Type 2 diabetes mellitus with other diabetic kidney complication: Secondary | ICD-10-CM | POA: Diagnosis not present

## 2023-09-28 DIAGNOSIS — E785 Hyperlipidemia, unspecified: Secondary | ICD-10-CM | POA: Diagnosis not present

## 2023-09-28 DIAGNOSIS — I10 Essential (primary) hypertension: Secondary | ICD-10-CM | POA: Diagnosis not present

## 2023-10-19 DIAGNOSIS — I214 Non-ST elevation (NSTEMI) myocardial infarction: Secondary | ICD-10-CM | POA: Diagnosis not present

## 2023-10-19 DIAGNOSIS — E7849 Other hyperlipidemia: Secondary | ICD-10-CM | POA: Diagnosis not present

## 2023-10-19 DIAGNOSIS — I77810 Thoracic aortic ectasia: Secondary | ICD-10-CM | POA: Diagnosis not present

## 2023-10-19 DIAGNOSIS — I959 Hypotension, unspecified: Secondary | ICD-10-CM | POA: Diagnosis not present

## 2023-10-19 DIAGNOSIS — Z9861 Coronary angioplasty status: Secondary | ICD-10-CM | POA: Diagnosis not present

## 2023-10-19 DIAGNOSIS — Z09 Encounter for follow-up examination after completed treatment for conditions other than malignant neoplasm: Secondary | ICD-10-CM | POA: Diagnosis not present

## 2023-10-19 DIAGNOSIS — I252 Old myocardial infarction: Secondary | ICD-10-CM | POA: Diagnosis not present

## 2023-10-19 DIAGNOSIS — R9431 Abnormal electrocardiogram [ECG] [EKG]: Secondary | ICD-10-CM | POA: Diagnosis not present

## 2023-10-19 DIAGNOSIS — I5020 Unspecified systolic (congestive) heart failure: Secondary | ICD-10-CM | POA: Diagnosis not present

## 2023-10-19 DIAGNOSIS — I25119 Atherosclerotic heart disease of native coronary artery with unspecified angina pectoris: Secondary | ICD-10-CM | POA: Diagnosis not present

## 2023-10-21 ENCOUNTER — Encounter: Payer: Self-pay | Admitting: Family Medicine

## 2023-10-21 ENCOUNTER — Ambulatory Visit (INDEPENDENT_AMBULATORY_CARE_PROVIDER_SITE_OTHER): Admitting: Family Medicine

## 2023-10-21 VITALS — BP 118/83 | HR 67 | Temp 97.6°F | Resp 16 | Ht 72.99 in | Wt 204.2 lb

## 2023-10-21 DIAGNOSIS — I959 Hypotension, unspecified: Secondary | ICD-10-CM | POA: Diagnosis not present

## 2023-10-21 DIAGNOSIS — Z930 Tracheostomy status: Secondary | ICD-10-CM | POA: Diagnosis not present

## 2023-10-21 DIAGNOSIS — I69359 Hemiplegia and hemiparesis following cerebral infarction affecting unspecified side: Secondary | ICD-10-CM

## 2023-10-21 DIAGNOSIS — F4321 Adjustment disorder with depressed mood: Secondary | ICD-10-CM

## 2023-10-21 DIAGNOSIS — J069 Acute upper respiratory infection, unspecified: Secondary | ICD-10-CM

## 2023-10-21 DIAGNOSIS — E118 Type 2 diabetes mellitus with unspecified complications: Secondary | ICD-10-CM

## 2023-10-21 DIAGNOSIS — I5020 Unspecified systolic (congestive) heart failure: Secondary | ICD-10-CM

## 2023-10-21 DIAGNOSIS — E038 Other specified hypothyroidism: Secondary | ICD-10-CM | POA: Diagnosis not present

## 2023-10-21 LAB — BAYER DCA HB A1C WAIVED: HB A1C (BAYER DCA - WAIVED): 6 % — ABNORMAL HIGH (ref 4.8–5.6)

## 2023-10-21 MED ORDER — MIDODRINE HCL 2.5 MG PO TABS: 2.5000 mg | ORAL_TABLET | Freq: Two times a day (BID) | ORAL | 2 refills | Status: AC

## 2023-10-21 MED ORDER — PREDNISONE 50 MG PO TABS: 50.0000 mg | ORAL_TABLET | Freq: Every day | ORAL | 0 refills | Status: DC

## 2023-10-21 MED ORDER — TADALAFIL 5 MG PO TABS: 5.0000 mg | ORAL_TABLET | Freq: Every day | ORAL | 2 refills | Status: AC

## 2023-10-21 MED ORDER — ROSUVASTATIN CALCIUM 40 MG PO TABS: 40.0000 mg | ORAL_TABLET | Freq: Every day | ORAL | 2 refills | Status: AC

## 2023-10-21 MED ORDER — TRAZODONE HCL 100 MG PO TABS: 100.0000 mg | ORAL_TABLET | Freq: Every evening | ORAL | 2 refills | Status: AC | PRN

## 2023-10-21 MED ORDER — OZEMPIC (0.25 OR 0.5 MG/DOSE) 2 MG/3ML ~~LOC~~ SOPN: 0.2500 mg | PEN_INJECTOR | SUBCUTANEOUS | 2 refills | Status: AC

## 2023-10-21 NOTE — Assessment & Plan Note (Addendum)
 Doing great with A1c of 6.0. Continue current regimen. Continue to monitor. Call with any concerns.

## 2023-10-21 NOTE — Progress Notes (Signed)
 BP 118/83 (BP Location: Left Arm, Patient Position: Sitting, Cuff Size: Normal)   Pulse 67   Temp 97.6 F (36.4 C) (Oral)   Resp 16   Ht 6' 0.99 (1.854 m)   Wt 204 lb 3.2 oz (92.6 kg)   SpO2 96%   BMI 26.95 kg/m    Subjective:    Patient ID: Luke [name], male    DOB: November 16, 1950, 73 y.o.   MRN: [id number]  HPI: Luke [name] is a 73 y.o. male  Chief Complaint  Patient presents with   Depression    Feels better since his heart got better.    URI    About 2 weeks now he has had runny nose and chest congestion, coughing, muscle aches. No fevers.    UPPER RESPIRATORY TRACT INFECTION Duration: 2 weeks Worst symptom: runny nose Fever: no Cough: no Shortness of breath: no Wheezing: yes Chest pain: no Chest tightness: no Chest congestion: yes Nasal congestion: yes Runny nose: yes Post nasal drip: yes Sneezing: no Sore throat: no Swollen glands: no Sinus pressure: no Headache: no Face pain: no Toothache: no Ear pain: no  Ear pressure: no  Eyes red/itching:no Eye drainage/crusting: no  Vomiting: no Rash: no Fatigue: no Sick contacts: no Strep contacts: no  Context: better Recurrent sinusitis: no Relief with OTC cold/cough medications: no  Treatments attempted: none   DIABETES Hypoglycemic episodes:no Polydipsia/polyuria: no Visual disturbance: no Chest pain: no Paresthesias: no Glucose Monitoring: no Taking Insulin?: no Blood Pressure Monitoring: rarely Retinal Examination: Not up to Date Foot Exam: Up to Date Diabetic Education: Completed Pneumovax: Up to Date Influenza: Up to Date Aspirin : yes  HYPOTHYROIDISM Thyroid  control status:better Satisfied with current treatment? yes Medication side effects: no Medication compliance: excellent compliance Recent dose adjustment:yes Fatigue: yes Cold intolerance: no Heat intolerance: no Weight gain: no Weight loss: no Constipation: no Diarrhea/loose stools: no Palpitations: no Lower  extremity edema: no Anxiety/depressed mood: no  Relevant past medical, surgical, family and social history reviewed and updated as indicated. Interim medical history since our last visit reviewed. Allergies and medications reviewed and updated.  Review of Systems  Constitutional: Negative.   Respiratory: Negative.    Cardiovascular: Negative.   Musculoskeletal: Negative.   Neurological: Negative.   Psychiatric/Behavioral: Negative.      Per HPI unless specifically indicated above     Objective:    BP 118/83 (BP Location: Left Arm, Patient Position: Sitting, Cuff Size: Normal)   Pulse 67   Temp 97.6 F (36.4 C) (Oral)   Resp 16   Ht 6' 0.99 (1.854 m)   Wt 204 lb 3.2 oz (92.6 kg)   SpO2 96%   BMI 26.95 kg/m   Wt Readings from Last 3 Encounters:  10/21/23 204 lb 3.2 oz (92.6 kg)  09/26/23 205 lb 12.8 oz (93.4 kg)  09/20/23 208 lb 6.4 oz (94.5 kg)    Physical Exam Vitals and nursing note reviewed.  Constitutional:      General: He is not in acute distress.    Appearance: Normal appearance. He is not ill-appearing, toxic-appearing or diaphoretic.  HENT:     Head: Normocephalic and atraumatic.     Right Ear: Tympanic membrane, ear canal and external ear normal.     Left Ear: Tympanic membrane, ear canal and external ear normal.     Nose: Nose normal. No congestion or rhinorrhea.     Mouth/Throat:     Mouth: Mucous membranes are moist.     Pharynx: Oropharynx  is clear. No oropharyngeal exudate or posterior oropharyngeal erythema.  Eyes:     General: No scleral icterus.       Right eye: No discharge.        Left eye: No discharge.     Extraocular Movements: Extraocular movements intact.     Conjunctiva/sclera: Conjunctivae normal.     Pupils: Pupils are equal, round, and reactive to light.  Cardiovascular:     Rate and Rhythm: Normal rate and regular rhythm.     Pulses: Normal pulses.     Heart sounds: Murmur heard.     No friction rub. No gallop.  Pulmonary:      Effort: Pulmonary effort is normal. No respiratory distress.     Breath sounds: Normal breath sounds. No stridor. No wheezing, rhonchi or rales.  Chest:     Chest wall: No tenderness.  Musculoskeletal:        General: Normal range of motion.     Cervical back: Normal range of motion and neck supple.  Skin:    General: Skin is warm and dry.     Capillary Refill: Capillary refill takes less than 2 seconds.     Coloration: Skin is not jaundiced or pale.     Findings: No bruising, erythema, lesion or rash.  Neurological:     General: No focal deficit present.     Mental Status: He is alert and oriented to person, place, and time. Mental status is at baseline.  Psychiatric:        Mood and Affect: Mood normal.        Behavior: Behavior normal.        Thought Content: Thought content normal.        Judgment: Judgment normal.     Results for orders placed or performed in visit on 09/06/23  CBC with Differential/Platelet   Collection Time: 09/06/23  4:09 PM  Result Value Ref Range   WBC 6.4 3.4 - 10.8 x10E3/uL   RBC 4.38 4.14 - 5.80 x10E6/uL   Hemoglobin 13.6 13.0 - 17.7 g/dL   Hematocrit 59.1 62.4 - 51.0 %   MCV 93 79 - 97 fL   MCH 31.1 26.6 - 33.0 pg   MCHC 33.3 31.5 - 35.7 g/dL   RDW 87.5 88.3 - 84.5 %   Platelets 253 150 - 450 x10E3/uL   Neutrophils 74 Not Estab. %   Lymphs 18 Not Estab. %   Monocytes 5 Not Estab. %   Eos 3 Not Estab. %   Basos 0 Not Estab. %   Neutrophils Absolute 4.7 1.4 - 7.0 x10E3/uL   Lymphocytes Absolute 1.1 0.7 - 3.1 x10E3/uL   Monocytes Absolute 0.3 0.1 - 0.9 x10E3/uL   EOS (ABSOLUTE) 0.2 0.0 - 0.4 x10E3/uL   Basophils Absolute 0.0 0.0 - 0.2 x10E3/uL   Immature Granulocytes 0 Not Estab. %   Immature Grans (Abs) 0.0 0.0 - 0.1 x10E3/uL  Comprehensive metabolic panel with GFR   Collection Time: 09/06/23  4:09 PM  Result Value Ref Range   Glucose 135 (H) 70 - 99 mg/dL   BUN 25 8 - 27 mg/dL   Creatinine, Ser 8.78 0.76 - 1.27 mg/dL   eGFR 64 >40  fO/fpw/8.26   BUN/Creatinine Ratio 21 10 - 24   Sodium 141 134 - 144 mmol/L   Potassium 4.1 3.5 - 5.2 mmol/L   Chloride 102 96 - 106 mmol/L   CO2 24 20 - 29 mmol/L   Calcium  9.6 8.6 - 10.2 mg/dL  Total Protein 6.3 6.0 - 8.5 g/dL   Albumin 4.2 3.8 - 4.8 g/dL   Globulin, Total 2.1 1.5 - 4.5 g/dL   Bilirubin Total 0.4 0.0 - 1.2 mg/dL   Alkaline Phosphatase 101 44 - 121 IU/L   AST 34 0 - 40 IU/L   ALT 31 0 - 44 IU/L  B12   Collection Time: 09/06/23  4:09 PM  Result Value Ref Range   Vitamin B-12 687 232 - 1,245 pg/mL  Magnesium   Collection Time: 09/06/23  4:09 PM  Result Value Ref Range   Magnesium 1.8 1.6 - 2.3 mg/dL  Phosphorus   Collection Time: 09/06/23  4:09 PM  Result Value Ref Range   Phosphorus 3.8 2.8 - 4.1 mg/dL  VITAMIN D  25 Hydroxy (Vit-D Deficiency, Fractures)   Collection Time: 09/06/23  4:09 PM  Result Value Ref Range   Vit D, 25-Hydroxy 35.3 30.0 - 100.0 ng/mL      Assessment & Plan:   Problem List Items Addressed This Visit       Cardiovascular and Mediastinum   Hypotension   Doing much better. Continue midodrine  PRN. Follow up with nephrology as needed. Avoid all anti-hypertensives       Relevant Medications   midodrine  (PROAMATINE ) 2.5 MG tablet   rosuvastatin  (CRESTOR ) 40 MG tablet   tadalafil  (CIALIS ) 5 MG tablet   HFrEF (heart failure with reduced ejection fraction) (HCC)   Continue to follow with cardiology. Call with any concerns.       Relevant Medications   midodrine  (PROAMATINE ) 2.5 MG tablet   rosuvastatin  (CRESTOR ) 40 MG tablet   tadalafil  (CIALIS ) 5 MG tablet     Endocrine   Secondary hypothyroidism   Rechecking labs today. Await results. Treat as needed.       Relevant Orders   TSH   Controlled type 2 diabetes mellitus with complication, without long-term current use of insulin (HCC) - Primary   Doing great with A1c of 6.0. Continue current regimen. Continue to monitor. Call with any concerns.       Relevant Medications    rosuvastatin  (CRESTOR ) 40 MG tablet   Semaglutide ,0.25 or 0.5MG /DOS, (OZEMPIC , 0.25 OR 0.5 MG/DOSE,) 2 MG/3ML SOPN   Other Relevant Orders   Bayer DCA Hb A1c Waived     Nervous and Auditory   Hemiplegia, dominant side S/P CVA (cerebrovascular accident) (HCC)   Stable. No concerns. Continue to monitor.         Other   Tracheostomy status (HCC)   Doing well. No concerns. Continue to monitor.       Other Visit Diagnoses       Upper respiratory tract infection, unspecified type       Will treat with burst of prednisone . Call if not getting better or getting worse. Continue to monitor.     Adjustment disorder with depressed mood       Feeling back to himself. No concerns.        Follow up plan: Return in about 7 months (around 05/10/2024) for physical.

## 2023-10-21 NOTE — Assessment & Plan Note (Signed)
Doing well. No concerns. Continue to monitor.  

## 2023-10-21 NOTE — Assessment & Plan Note (Signed)
 Doing much better. Continue midodrine  PRN. Follow up with nephrology as needed. Avoid all anti-hypertensives

## 2023-10-21 NOTE — Assessment & Plan Note (Signed)
 Rechecking labs today. Await results. Treat as needed.

## 2023-10-21 NOTE — Assessment & Plan Note (Signed)
Stable. No concerns. Continue to monitor.  

## 2023-10-21 NOTE — Assessment & Plan Note (Signed)
Continue to follow with cardiology. Call with any concerns.  

## 2023-10-22 LAB — TSH: TSH: 2.46 u[IU]/mL (ref 0.450–4.500)

## 2023-10-27 ENCOUNTER — Ambulatory Visit: Payer: Self-pay | Admitting: Family Medicine

## 2023-10-27 DIAGNOSIS — Z8572 Personal history of non-Hodgkin lymphomas: Secondary | ICD-10-CM

## 2023-10-27 MED ORDER — LEVOTHYROXINE SODIUM 100 MCG PO TABS: 100.0000 ug | ORAL_TABLET | Freq: Every day | ORAL | 3 refills | Status: AC

## 2023-10-31 DIAGNOSIS — E1129 Type 2 diabetes mellitus with other diabetic kidney complication: Secondary | ICD-10-CM | POA: Diagnosis not present

## 2023-10-31 DIAGNOSIS — I10 Essential (primary) hypertension: Secondary | ICD-10-CM | POA: Diagnosis not present

## 2023-10-31 DIAGNOSIS — R809 Proteinuria, unspecified: Secondary | ICD-10-CM | POA: Diagnosis not present

## 2023-10-31 DIAGNOSIS — I959 Hypotension, unspecified: Secondary | ICD-10-CM | POA: Diagnosis not present

## 2023-10-31 DIAGNOSIS — E785 Hyperlipidemia, unspecified: Secondary | ICD-10-CM | POA: Diagnosis not present

## 2023-11-03 DIAGNOSIS — H3581 Retinal edema: Secondary | ICD-10-CM | POA: Diagnosis not present

## 2023-11-03 DIAGNOSIS — H40023 Open angle with borderline findings, high risk, bilateral: Secondary | ICD-10-CM | POA: Diagnosis not present

## 2023-11-03 LAB — HM DIABETES EYE EXAM

## 2023-11-14 ENCOUNTER — Telehealth: Payer: Self-pay

## 2023-11-14 NOTE — Telephone Encounter (Signed)
 Routing to provider to advise.

## 2023-11-14 NOTE — Telephone Encounter (Signed)
 Copied from CRM [id number]. Topic: Clinical - Prescription Issue >> Nov 14, 2023 12:13 PM Darshell M wrote: Reason for CRM: Rosaline calling from [facility]. Received an alert that patient has not been prescribed a daily maintenance inhaler for his COPD. Is it possible for the provider prescribe one for the patient? If there is a contraindication, please document so [employer] can clear the alert. Rosaline CB# [phone]

## 2023-11-17 DIAGNOSIS — H401112 Primary open-angle glaucoma, right eye, moderate stage: Secondary | ICD-10-CM | POA: Diagnosis not present

## 2023-11-17 DIAGNOSIS — H401123 Primary open-angle glaucoma, left eye, severe stage: Secondary | ICD-10-CM | POA: Diagnosis not present

## 2023-11-17 DIAGNOSIS — H3581 Retinal edema: Secondary | ICD-10-CM | POA: Diagnosis not present

## 2023-11-17 DIAGNOSIS — H43812 Vitreous degeneration, left eye: Secondary | ICD-10-CM | POA: Diagnosis not present

## 2023-11-22 DIAGNOSIS — E119 Type 2 diabetes mellitus without complications: Secondary | ICD-10-CM | POA: Diagnosis not present

## 2023-11-22 DIAGNOSIS — Z7982 Long term (current) use of aspirin: Secondary | ICD-10-CM | POA: Diagnosis not present

## 2023-11-22 DIAGNOSIS — R0789 Other chest pain: Secondary | ICD-10-CM | POA: Diagnosis not present

## 2023-11-22 DIAGNOSIS — Z8679 Personal history of other diseases of the circulatory system: Secondary | ICD-10-CM | POA: Diagnosis not present

## 2023-11-22 DIAGNOSIS — K219 Gastro-esophageal reflux disease without esophagitis: Secondary | ICD-10-CM | POA: Diagnosis not present

## 2023-11-22 DIAGNOSIS — I5020 Unspecified systolic (congestive) heart failure: Secondary | ICD-10-CM | POA: Diagnosis not present

## 2023-11-22 DIAGNOSIS — E785 Hyperlipidemia, unspecified: Secondary | ICD-10-CM | POA: Diagnosis not present

## 2023-11-22 DIAGNOSIS — I5030 Unspecified diastolic (congestive) heart failure: Secondary | ICD-10-CM | POA: Diagnosis not present

## 2023-11-22 DIAGNOSIS — I25119 Atherosclerotic heart disease of native coronary artery with unspecified angina pectoris: Secondary | ICD-10-CM | POA: Diagnosis not present

## 2023-11-22 DIAGNOSIS — I110 Hypertensive heart disease with heart failure: Secondary | ICD-10-CM | POA: Diagnosis not present

## 2023-11-22 DIAGNOSIS — E1139 Type 2 diabetes mellitus with other diabetic ophthalmic complication: Secondary | ICD-10-CM | POA: Diagnosis not present

## 2023-11-22 DIAGNOSIS — I2510 Atherosclerotic heart disease of native coronary artery without angina pectoris: Secondary | ICD-10-CM | POA: Diagnosis not present

## 2023-11-22 DIAGNOSIS — R079 Chest pain, unspecified: Secondary | ICD-10-CM | POA: Diagnosis not present

## 2023-11-22 DIAGNOSIS — Z87891 Personal history of nicotine dependence: Secondary | ICD-10-CM | POA: Diagnosis not present

## 2023-11-22 DIAGNOSIS — I252 Old myocardial infarction: Secondary | ICD-10-CM | POA: Diagnosis not present

## 2023-11-22 DIAGNOSIS — I5022 Chronic systolic (congestive) heart failure: Secondary | ICD-10-CM | POA: Diagnosis not present

## 2023-11-22 DIAGNOSIS — Z930 Tracheostomy status: Secondary | ICD-10-CM | POA: Diagnosis not present

## 2023-11-23 DIAGNOSIS — I2510 Atherosclerotic heart disease of native coronary artery without angina pectoris: Secondary | ICD-10-CM | POA: Diagnosis not present

## 2023-11-23 DIAGNOSIS — R0789 Other chest pain: Secondary | ICD-10-CM | POA: Diagnosis not present

## 2023-11-23 DIAGNOSIS — Z955 Presence of coronary angioplasty implant and graft: Secondary | ICD-10-CM | POA: Diagnosis not present

## 2023-11-23 DIAGNOSIS — I214 Non-ST elevation (NSTEMI) myocardial infarction: Secondary | ICD-10-CM | POA: Diagnosis not present

## 2023-11-23 DIAGNOSIS — I25110 Atherosclerotic heart disease of native coronary artery with unstable angina pectoris: Secondary | ICD-10-CM | POA: Diagnosis not present

## 2023-11-24 DIAGNOSIS — R079 Chest pain, unspecified: Secondary | ICD-10-CM | POA: Diagnosis not present

## 2023-11-30 DIAGNOSIS — F17200 Nicotine dependence, unspecified, uncomplicated: Secondary | ICD-10-CM | POA: Diagnosis not present

## 2023-11-30 DIAGNOSIS — Z7982 Long term (current) use of aspirin: Secondary | ICD-10-CM | POA: Diagnosis not present

## 2023-11-30 DIAGNOSIS — Z79899 Other long term (current) drug therapy: Secondary | ICD-10-CM | POA: Diagnosis not present

## 2023-11-30 DIAGNOSIS — E785 Hyperlipidemia, unspecified: Secondary | ICD-10-CM | POA: Diagnosis not present

## 2023-11-30 DIAGNOSIS — I25110 Atherosclerotic heart disease of native coronary artery with unstable angina pectoris: Secondary | ICD-10-CM | POA: Diagnosis not present

## 2023-11-30 DIAGNOSIS — I5032 Chronic diastolic (congestive) heart failure: Secondary | ICD-10-CM | POA: Diagnosis not present

## 2023-11-30 DIAGNOSIS — I252 Old myocardial infarction: Secondary | ICD-10-CM | POA: Diagnosis not present

## 2023-11-30 DIAGNOSIS — Z7902 Long term (current) use of antithrombotics/antiplatelets: Secondary | ICD-10-CM | POA: Diagnosis not present

## 2023-11-30 DIAGNOSIS — E119 Type 2 diabetes mellitus without complications: Secondary | ICD-10-CM | POA: Diagnosis not present

## 2023-12-01 DIAGNOSIS — R079 Chest pain, unspecified: Secondary | ICD-10-CM | POA: Diagnosis not present

## 2023-12-07 ENCOUNTER — Encounter: Attending: Cardiology | Admitting: *Deleted

## 2023-12-07 DIAGNOSIS — Z9861 Coronary angioplasty status: Secondary | ICD-10-CM

## 2023-12-07 DIAGNOSIS — I214 Non-ST elevation (NSTEMI) myocardial infarction: Secondary | ICD-10-CM

## 2023-12-07 NOTE — Progress Notes (Signed)
 Initial phone call completed. Diagnosis can be found in [facility] 11/19. EP Orientation scheduled for Monday 12/1 at 9am.

## 2023-12-12 ENCOUNTER — Encounter

## 2023-12-12 VITALS — Ht 72.4 in | Wt 207.8 lb

## 2023-12-12 DIAGNOSIS — Z9861 Coronary angioplasty status: Secondary | ICD-10-CM | POA: Insufficient documentation

## 2023-12-12 DIAGNOSIS — I959 Hypotension, unspecified: Secondary | ICD-10-CM | POA: Diagnosis not present

## 2023-12-12 DIAGNOSIS — I214 Non-ST elevation (NSTEMI) myocardial infarction: Secondary | ICD-10-CM | POA: Diagnosis present

## 2023-12-12 DIAGNOSIS — R809 Proteinuria, unspecified: Secondary | ICD-10-CM | POA: Diagnosis not present

## 2023-12-12 DIAGNOSIS — E785 Hyperlipidemia, unspecified: Secondary | ICD-10-CM | POA: Diagnosis not present

## 2023-12-12 DIAGNOSIS — E1129 Type 2 diabetes mellitus with other diabetic kidney complication: Secondary | ICD-10-CM | POA: Diagnosis not present

## 2023-12-12 NOTE — Patient Instructions (Signed)
 Patient Instructions  Patient Details  Name: Luke [name] MRN: [id number] Date of Birth: Jan 29, 1950 Referring Provider:  Jama Margery ORN, MD  Below are your personal goals for exercise, nutrition, and risk factors. Our goal is to help you stay on track towards obtaining and maintaining these goals. We will be discussing your progress on these goals with you throughout the program.  Initial Exercise Prescription:  Initial Exercise Prescription - 12/12/23 1100       Date of Initial Exercise RX and Referring Provider   Date 12/12/23    Referring Provider Jama Margery, MD      Oxygen    Maintain Oxygen  Saturation 88% or higher      NuStep   Level 2   T4 and T6   SPM 80    Minutes 15    METs 2.45      REL-XR   Level 1    Speed 50    Minutes 15    METs 2.45      T5 Nustep   Level 2    SPM 80    Minutes 15    METs 2.45      Track   Laps 27    Minutes 15    METs 2.47      Prescription Details   Frequency (times per week) 3    Duration Progress to 30 minutes of continuous aerobic without signs/symptoms of physical distress      Intensity   THRR 40-80% of Max Heartrate 102-133    Ratings of Perceived Exertion 11-13    Perceived Dyspnea 0-4      Progression   Progression Continue to progress workloads to maintain intensity without signs/symptoms of physical distress.      Resistance Training   Training Prescription Yes    Weight 3 lb    Reps 10-15          Exercise Goals: Frequency: Be able to perform aerobic exercise two to three times per week in program working toward 2-5 days per week of home exercise.  Intensity: Work with a perceived exertion of 11 (fairly light) - 15 (hard) while following your exercise prescription.  We will make changes to your prescription with you as you progress through the program.   Duration: Be able to do 30 to 45 minutes of continuous aerobic exercise in addition to a 5 minute warm-up and a 5 minute cool-down routine.   Nutrition  Goals: Your personal nutrition goals will be established when you do your nutrition analysis with the dietician.  The following are general nutrition guidelines to follow: Cholesterol < 200mg /day Sodium < 1500mg /day Fiber: Men over 50 yrs - 30 grams per day  Personal Goals:  Personal Goals and Risk Factors at Admission - 12/12/23 1112       Core Components/Risk Factors/Patient Goals on Admission    Weight Management Yes;Weight Loss    Intervention Weight Management: Develop a combined nutrition and exercise program designed to reach desired caloric intake, while maintaining appropriate intake of nutrient and fiber, sodium and fats, and appropriate energy expenditure required for the weight goal.;Weight Management: Provide education and appropriate resources to help participant work on and attain dietary goals.;Weight Management/Obesity: Establish reasonable short term and long term weight goals.    Admit Weight 207 lb 12.8 oz (94.3 kg)    Goal Weight: Short Term 205 lb (93 kg)    Goal Weight: Long Term 200 lb (90.7 kg)    Expected Outcomes Short Term: Continue to  assess and modify interventions until short term weight is achieved;Long Term: Adherence to nutrition and physical activity/exercise program aimed toward attainment of established weight goal;Weight Loss: Understanding of general recommendations for a balanced deficit meal plan, which promotes 1-2 lb weight loss per week and includes a negative energy balance of [phone] kcal/d;Understanding recommendations for meals to include 15-35% energy as protein, 25-35% energy from fat, 35-60% energy from carbohydrates, less than 200mg  of dietary cholesterol, 20-35 gm of total fiber daily;Understanding of distribution of calorie intake throughout the day with the consumption of 4-5 meals/snacks    Diabetes Yes    Intervention Provide education about signs/symptoms and action to take for hypo/hyperglycemia.;Provide education about proper nutrition,  including hydration, and aerobic/resistive exercise prescription along with prescribed medications to achieve blood glucose in normal ranges: Fasting glucose 65-99 mg/dL    Expected Outcomes Short Term: Participant verbalizes understanding of the signs/symptoms and immediate care of hyper/hypoglycemia, proper foot care and importance of medication, aerobic/resistive exercise and nutrition plan for blood glucose control.;Long Term: Attainment of HbA1C < 7%.    Lipids Yes    Intervention Provide education and support for participant on nutrition & aerobic/resistive exercise along with prescribed medications to achieve LDL 70mg , HDL >40mg .    Expected Outcomes Short Term: Participant states understanding of desired cholesterol values and is compliant with medications prescribed. Participant is following exercise prescription and nutrition guidelines.;Long Term: Cholesterol controlled with medications as prescribed, with individualized exercise RX and with personalized nutrition plan. Value goals: LDL < 70mg , HDL > 40 mg.          Tobacco Use Initial Evaluation: Social History   Tobacco Use  Smoking Status Former   Types: Cigarettes   Start date: 1970  Smokeless Tobacco Former  Tobacco Comments   05/03/19- down to half a pack or less / day    Exercise Goals and Review:  Exercise Goals     Row Name 12/12/23 1102             Exercise Goals   Increase Physical Activity Yes       Intervention Provide advice, education, support and counseling about physical activity/exercise needs.;Develop an individualized exercise prescription for aerobic and resistive training based on initial evaluation findings, risk stratification, comorbidities and participant's personal goals.       Expected Outcomes Short Term: Attend rehab on a regular basis to increase amount of physical activity.;Long Term: Add in home exercise to make exercise part of routine and to increase amount of physical activity.;Long  Term: Exercising regularly at least 3-5 days a week.       Increase Strength and Stamina Yes       Intervention Provide advice, education, support and counseling about physical activity/exercise needs.;Develop an individualized exercise prescription for aerobic and resistive training based on initial evaluation findings, risk stratification, comorbidities and participant's personal goals.       Expected Outcomes Short Term: Perform resistance training exercises routinely during rehab and add in resistance training at home;Short Term: Increase workloads from initial exercise prescription for resistance, speed, and METs.;Long Term: Improve cardiorespiratory fitness, muscular endurance and strength as measured by increased METs and functional capacity ( )       Able to understand and use rate of perceived exertion (RPE) scale Yes       Intervention Provide education and explanation on how to use RPE scale       Expected Outcomes Short Term: Able to use RPE daily in rehab to express subjective intensity level;Long  Term:  Able to use RPE to guide intensity level when exercising independently       Able to understand and use Dyspnea scale Yes       Intervention Provide education and explanation on how to use Dyspnea scale       Expected Outcomes Short Term: Able to use Dyspnea scale daily in rehab to express subjective sense of shortness of breath during exertion;Long Term: Able to use Dyspnea scale to guide intensity level when exercising independently       Knowledge and understanding of Target Heart Rate Range (THRR) Yes       Intervention Provide education and explanation of THRR including how the numbers were predicted and where they are located for reference       Expected Outcomes Short Term: Able to state/look up THRR;Short Term: Able to use daily as guideline for intensity in rehab;Long Term: Able to use THRR to govern intensity when exercising independently       Able to check pulse independently  Yes       Intervention Provide education and demonstration on how to check pulse in carotid and radial arteries.;Review the importance of being able to check your own pulse for safety during independent exercise       Expected Outcomes Short Term: Able to explain why pulse checking is important during independent exercise;Long Term: Able to check pulse independently and accurately       Understanding of Exercise Prescription Yes       Intervention Provide education, explanation, and written materials on patient's individual exercise prescription       Expected Outcomes Short Term: Able to explain program exercise prescription;Long Term: Able to explain home exercise prescription to exercise independently

## 2023-12-12 NOTE — Progress Notes (Signed)
 Cardiac Individual Treatment Plan  Patient Details  Name: Luke [name] MRN: [id number] Date of Birth: Mar 23, 1950 Referring Provider:   Flowsheet Row Cardiac Rehab from 12/12/2023 in [facility] Cardiac and Pulmonary Rehab  Referring Provider [name], MD    Initial Encounter Date:  Flowsheet Row Cardiac Rehab from 12/12/2023 in [facility] Cardiac and Pulmonary Rehab  Date 12/12/23    Visit Diagnosis: NSTEMI (non-ST elevated myocardial infarction) [facility])  Status post percutaneous coronary intervention (PCI)  Patient's Home Medications on Admission:  Current Outpatient Medications:    aspirin  EC 81 MG tablet, Take 1 tablet (81 mg total) by mouth daily. Swallow whole., Disp: 90 tablet, Rfl: 3   levothyroxine  (SYNTHROID ) 100 MCG tablet, Take 1 tablet (100 mcg total) by mouth daily before breakfast., Disp: 90 tablet, Rfl: 3   lisinopril (ZESTRIL) 5 MG tablet, Take 5 mg by mouth., Disp: , Rfl:    metoprolol succinate (TOPROL-XL) 25 MG 24 hr tablet, Take by mouth., Disp: , Rfl:    midodrine  (PROAMATINE ) 2.5 MG tablet, Take 1 tablet (2.5 mg total) by mouth 2 (two) times daily with a meal. For BP <100/70, Disp: 180 tablet, Rfl: 2   Multiple Vitamin (MULTI-VITAMIN) tablet, Take 1 tablet by mouth daily., Disp: , Rfl:    nitroGLYCERIN (NITROSTAT) 0.4 MG SL tablet, Place 0.4 mg under the tongue every 5 (five) minutes as needed for chest pain., Disp: , Rfl:    omeprazole  (PRILOSEC) 20 MG capsule, Take 1 capsule (20 mg total) by mouth daily., Disp: 90 capsule, Rfl: 3   predniSONE  (DELTASONE ) 50 MG tablet, Take 1 tablet (50 mg total) by mouth daily with breakfast., Disp: 5 tablet, Rfl: 0   rosuvastatin  (CRESTOR ) 40 MG tablet, Take 1 tablet (40 mg total) by mouth daily., Disp: 90 tablet, Rfl: 2   Semaglutide ,0.25 or 0.5MG /DOS, (OZEMPIC , 0.25 OR 0.5 MG/DOSE,) 2 MG/3ML SOPN, Inject 0.25 mg into the skin once a week., Disp: 9 mL, Rfl: 2   tadalafil  (CIALIS ) 5 MG tablet, Take 1 tablet (5 mg total) by mouth daily.,  Disp: 90 tablet, Rfl: 2   ticagrelor (BRILINTA) 90 MG TABS tablet, Take 90 mg by mouth 2 (two) times daily., Disp: , Rfl:    traZODone  (DESYREL ) 100 MG tablet, Take 1 tablet (100 mg total) by mouth at bedtime as needed for sleep., Disp: 90 tablet, Rfl: 2  Past Medical History: Past Medical History:  Diagnosis Date   Anxiety    Arthritis    Carcinoma metastatic to lymph node (HCC)    Cataract    right   COPD (chronic obstructive pulmonary disease) (HCC)    Depression    GERD (gastroesophageal reflux disease)    Hoarseness of voice    Personal history of tobacco use, presenting hazards to health February of 2001   Thyroid  disease     Tobacco Use: Social History   Tobacco Use  Smoking Status Former   Types: Cigarettes   Start date: 1970  Smokeless Tobacco Former  Tobacco Comments   05/03/19- down to half a pack or less / day    Labs: Review Flowsheet  More data exists      Latest Ref Rng & Units 06/01/2021 01/08/2022 08/20/2022 05/10/2023 10/21/2023  Labs for ITP Cardiac and Pulmonary Rehab  Cholestrol 100 - 199 mg/dL 797  800  766  811  -  LDL (calc) 0 - 99 mg/dL 858  871  862  881  -  HDL-C >39 mg/dL 33  30  33  35  -  Trlycerides 0 - 149 mg/dL 844  768  654  803  -  Hemoglobin A1c 4.8 - 5.6 % 6.0  6.3  - 6.7  6.0      Exercise Target Goals: Exercise Program Goal: Individual exercise prescription set using results from initial 6 min walk test and THRR while considering  patient's activity barriers and safety.   Exercise Prescription Goal: Initial exercise prescription builds to 30-45 minutes a day of aerobic activity, 2-3 days per week.  Home exercise guidelines will be given to patient during program as part of exercise prescription that the participant will acknowledge.   Education: Aerobic Exercise: - Group verbal and visual presentation on the components of exercise prescription. Introduces F.I.T.T principle from ACSM for exercise prescriptions.  Reviews F.I.T.T.  principles of aerobic exercise including progression. Written material provided at class time. Flowsheet Row Cardiac Rehab from 12/12/2023 in [facility] Cardiac and Pulmonary Rehab  Education need identified 12/12/23    Education: Resistance Exercise: - Group verbal and visual presentation on the components of exercise prescription. Introduces F.I.T.T principle from ACSM for exercise prescriptions  Reviews F.I.T.T. principles of resistance exercise including progression. Written material provided at class time.    Education: Exercise & Equipment Safety: - Individual verbal instruction and demonstration of equipment use and safety with use of the equipment. Flowsheet Row Cardiac Rehab from 12/12/2023 in [facility] Cardiac and Pulmonary Rehab  Date 12/12/23  Educator MB  Instruction Review Code 1- Verbalizes Understanding    Education: Exercise Physiology & General Exercise Guidelines: - Group verbal and written instruction with models to review the exercise physiology of the cardiovascular system and associated critical values. Provides general exercise guidelines with specific guidelines to those with heart or lung disease. Written material provided at class time.   Education: Flexibility, Balance, Mind/Body Relaxation: - Group verbal and visual presentation with interactive activity on the components of exercise prescription. Introduces F.I.T.T principle from ACSM for exercise prescriptions. Reviews F.I.T.T. principles of flexibility and balance exercise training including progression. Also discusses the mind body connection.  Reviews various relaxation techniques to help reduce and manage stress (i.e. Deep breathing, progressive muscle relaxation, and visualization). Balance handout provided to take home. Written material provided at class time.   Activity Barriers & Risk Stratification:  Activity Barriers & Cardiac Risk Stratification - 12/12/23 1059       Activity Barriers & Cardiac Risk  Stratification   Activity Barriers Back Problems;Deconditioning    Cardiac Risk Stratification High          6 Minute Walk:  6 Minute Walk     Row Name 12/12/23 1059         6 Minute Walk   Phase Initial     Distance 1120 feet     Walk Time 6 minutes     # of Rest Breaks 0     MPH 2.12     METS 2.45     RPE 11     Perceived Dyspnea  0     VO2 Peak 8.57     Symptoms No     Resting HR 71 bpm     Resting BP 84/60     Resting Oxygen  Saturation  95 %     Exercise Oxygen  Saturation  during 6 min walk 97 %     Max Ex. HR 92 bpm     Max Ex. BP 122/60     2 Minute Post BP 116/78  Oxygen  Initial Assessment:   Oxygen  Re-Evaluation:   Oxygen  Discharge (Final Oxygen  Re-Evaluation):   Initial Exercise Prescription:  Initial Exercise Prescription - 12/12/23 1100       Date of Initial Exercise RX and Referring Provider   Date 12/12/23    Referring Provider [name], MD      Oxygen    Maintain Oxygen  Saturation 88% or higher      NuStep   Level 2   T4 and T6   SPM 80    Minutes 15    METs 2.45      REL-XR   Level 1    Speed 50    Minutes 15    METs 2.45      T5 Nustep   Level 2    SPM 80    Minutes 15    METs 2.45      Track   Laps 27    Minutes 15    METs 2.47      Prescription Details   Frequency (times per week) 3    Duration Progress to 30 minutes of continuous aerobic without signs/symptoms of physical distress      Intensity   THRR 40-80% of Max Heartrate 102-133    Ratings of Perceived Exertion 11-13    Perceived Dyspnea 0-4      Progression   Progression Continue to progress workloads to maintain intensity without signs/symptoms of physical distress.      Resistance Training   Training Prescription Yes    Weight 3 lb    Reps 10-15          Perform Capillary Blood Glucose checks as needed.  Exercise Prescription Changes:   Exercise Prescription Changes     Row Name 12/12/23 1100             Response to  Exercise   Blood Pressure (Admit) 84/60       Blood Pressure (Exercise) 122/60       Blood Pressure (Exit) 116/78       Heart Rate (Admit) 71 bpm       Heart Rate (Exercise) 92 bpm       Heart Rate (Exit) 72 bpm       Oxygen  Saturation (Admit) 95 %       Oxygen  Saturation (Exercise) 97 %       Oxygen  Saturation (Exit) 97 %       Rating of Perceived Exertion (Exercise) 11       Perceived Dyspnea (Exercise) 0       Symptoms none       Comments results         Progression   Average METs 2.45          Exercise Comments:   Exercise Goals and Review:   Exercise Goals     Row Name 12/12/23 1102             Exercise Goals   Increase Physical Activity Yes       Intervention Provide advice, education, support and counseling about physical activity/exercise needs.;Develop an individualized exercise prescription for aerobic and resistive training based on initial evaluation findings, risk stratification, comorbidities and participant's personal goals.       Expected Outcomes Short Term: Attend rehab on a regular basis to increase amount of physical activity.;Long Term: Add in home exercise to make exercise part of routine and to increase amount of physical activity.;Long Term: Exercising regularly at least 3-5 days a week.  Increase Strength and Stamina Yes       Intervention Provide advice, education, support and counseling about physical activity/exercise needs.;Develop an individualized exercise prescription for aerobic and resistive training based on initial evaluation findings, risk stratification, comorbidities and participant's personal goals.       Expected Outcomes Short Term: Perform resistance training exercises routinely during rehab and add in resistance training at home;Short Term: Increase workloads from initial exercise prescription for resistance, speed, and METs.;Long Term: Improve cardiorespiratory fitness, muscular endurance and strength as measured by  increased METs and functional capacity ( )       Able to understand and use rate of perceived exertion (RPE) scale Yes       Intervention Provide education and explanation on how to use RPE scale       Expected Outcomes Short Term: Able to use RPE daily in rehab to express subjective intensity level;Long Term:  Able to use RPE to guide intensity level when exercising independently       Able to understand and use Dyspnea scale Yes       Intervention Provide education and explanation on how to use Dyspnea scale       Expected Outcomes Short Term: Able to use Dyspnea scale daily in rehab to express subjective sense of shortness of breath during exertion;Long Term: Able to use Dyspnea scale to guide intensity level when exercising independently       Knowledge and understanding of Target Heart Rate Range (THRR) Yes       Intervention Provide education and explanation of THRR including how the numbers were predicted and where they are located for reference       Expected Outcomes Short Term: Able to state/look up THRR;Short Term: Able to use daily as guideline for intensity in rehab;Long Term: Able to use THRR to govern intensity when exercising independently       Able to check pulse independently Yes       Intervention Provide education and demonstration on how to check pulse in carotid and radial arteries.;Review the importance of being able to check your own pulse for safety during independent exercise       Expected Outcomes Short Term: Able to explain why pulse checking is important during independent exercise;Long Term: Able to check pulse independently and accurately       Understanding of Exercise Prescription Yes       Intervention Provide education, explanation, and written materials on patient's individual exercise prescription       Expected Outcomes Short Term: Able to explain program exercise prescription;Long Term: Able to explain home exercise prescription to exercise independently           Exercise Goals Re-Evaluation :   Discharge Exercise Prescription (Final Exercise Prescription Changes):  Exercise Prescription Changes - 12/12/23 1100       Response to Exercise   Blood Pressure (Admit) 84/60    Blood Pressure (Exercise) 122/60    Blood Pressure (Exit) 116/78    Heart Rate (Admit) 71 bpm    Heart Rate (Exercise) 92 bpm    Heart Rate (Exit) 72 bpm    Oxygen  Saturation (Admit) 95 %    Oxygen  Saturation (Exercise) 97 %    Oxygen  Saturation (Exit) 97 %    Rating of Perceived Exertion (Exercise) 11    Perceived Dyspnea (Exercise) 0    Symptoms none    Comments results      Progression   Average METs 2.45  Nutrition:  Target Goals: Understanding of nutrition guidelines, daily intake of sodium 1500mg , cholesterol 200mg , calories 30% from fat and 7% or less from saturated fats, daily to have 5 or more servings of fruits and vegetables.  Education: Nutrition 1 -Group instruction provided by verbal, written material, interactive activities, discussions, models, and posters to present general guidelines for heart healthy nutrition including macronutrients, label reading, and promoting whole foods over processed counterparts. Education serves as pensions consultant of discussion of heart healthy eating for all. Written material provided at class time.    Education: Nutrition 2 -Group instruction provided by verbal, written material, interactive activities, discussions, models, and posters to present general guidelines for heart healthy nutrition including sodium, cholesterol, and saturated fat. Providing guidance of habit forming to improve blood pressure, cholesterol, and body weight. Written material provided at class time.     Biometrics:  Pre Biometrics - 12/12/23 1103       Pre Biometrics   Height 6' 0.4 (1.839 m)    Weight 207 lb 12.8 oz (94.3 kg)    Waist Circumference 45.6 inches    Hip Circumference 41.8 inches    Waist to Hip Ratio  1.09 %    BMI (Calculated) 27.87    Single Leg Stand 22.6 seconds           Nutrition Therapy Plan and Nutrition Goals:  Nutrition Therapy & Goals - 12/12/23 1110       Nutrition Therapy   RD appointment deferred Yes      Personal Nutrition Goals   Nutrition Goal RD appointment deferred at this time      Intervention Plan   Intervention Prescribe, educate and counsel regarding individualized specific dietary modifications aiming towards targeted core components such as weight, hypertension, lipid management, diabetes, heart failure and other comorbidities.    Expected Outcomes Short Term Goal: Understand basic principles of dietary content, such as calories, fat, sodium, cholesterol and nutrients.          Nutrition Assessments:  MEDIFICTS Score Key: >=70 Need to make dietary changes  40-70 Heart Healthy Diet <= 40 Therapeutic Level Cholesterol Diet  Flowsheet Row Cardiac Rehab from 12/12/2023 in [facility] Cardiac and Pulmonary Rehab  Picture Your Plate Total Score on Admission 44   Picture Your Plate Scores: <59 Unhealthy dietary pattern with much room for improvement. 41-50 Dietary pattern unlikely to meet recommendations for good health and room for improvement. 51-60 More healthful dietary pattern, with some room for improvement.  >60 Healthy dietary pattern, although there may be some specific behaviors that could be improved.    Nutrition Goals Re-Evaluation:   Nutrition Goals Discharge (Final Nutrition Goals Re-Evaluation):   Psychosocial: Target Goals: Acknowledge presence or absence of significant depression and/or stress, maximize coping skills, provide positive support system. Participant is able to verbalize types and ability to use techniques and skills needed for reducing stress and depression.   Education: Stress, Anxiety, and Depression - Group verbal and visual presentation to define topics covered.  Reviews how body is impacted by stress, anxiety, and  depression.  Also discusses healthy ways to reduce stress and to treat/manage anxiety and depression. Written material provided at class time.   Education: Sleep Hygiene -Provides group verbal and written instruction about how sleep can affect your health.  Define sleep hygiene, discuss sleep cycles and impact of sleep habits. Review good sleep hygiene tips.   Initial Review & Psychosocial Screening:  Initial Psych Review & Screening - 12/07/23 0941  Initial Review   Current issues with None Identified      Family Dynamics   Good Support System? Yes   wife     Barriers   Psychosocial barriers to participate in program There are no identifiable barriers or psychosocial needs.;The patient should benefit from training in stress management and relaxation.      Screening Interventions   Interventions Encouraged to exercise;Provide feedback about the scores to participant;To provide support and resources with identified psychosocial needs    Expected Outcomes Short Term goal: Utilizing psychosocial counselor, staff and physician to assist with identification of specific Stressors or current issues interfering with healing process. Setting desired goal for each stressor or current issue identified.;Long Term Goal: Stressors or current issues are controlled or eliminated.;Short Term goal: Identification and review with participant of any Quality of Life or Depression concerns found by scoring the questionnaire.;Long Term goal: The participant improves quality of Life and PHQ9 Scores as seen by post scores and/or verbalization of changes          Quality of Life Scores:   Quality of Life - 12/12/23 1111       Quality of Life   Select Quality of Life      Quality of Life Scores   Health/Function Pre 26 %    Socioeconomic Pre 30 %    Psych/Spiritual Pre 30 %    Family Pre 30 %    GLOBAL Pre 28.24 %         Scores of 19 and below usually indicate a poorer quality of life in  these areas.  A difference of  2-3 points is a clinically meaningful difference.  A difference of 2-3 points in the total score of the Quality of Life Index has been associated with significant improvement in overall quality of life, self-image, physical symptoms, and general health in studies assessing change in quality of life.  PHQ-9: Review Flowsheet  More data exists      12/12/2023 10/21/2023 09/26/2023 09/20/2023 08/11/2023  Depression screen PHQ 2/9  Decreased Interest 0 0 0 1 0  Down, Depressed, Hopeless 0 0 0 1 0  PHQ - 2 Score 0 0 0 2 0  Altered sleeping 1 0 0 - 0  Tired, decreased energy 1 0 0 3 0  Change in appetite 1 0 0 0 0  Feeling bad or failure about yourself  0 0 0 1 0  Trouble concentrating 0 0 0 0 0  Moving slowly or fidgety/restless 1 0 0 0 0  Suicidal thoughts 0 0 0 0 0  PHQ-9 Score 4 0  0  - 0   Difficult doing work/chores Somewhat difficult - - Somewhat difficult -    Details       Data saved with a previous flowsheet row definition        Interpretation of Total Score  Total Score Depression Severity:  1-4 = Minimal depression, 5-9 = Mild depression, 10-14 = Moderate depression, 15-19 = Moderately severe depression, 20-27 = Severe depression   Psychosocial Evaluation and Intervention:  Psychosocial Evaluation - 12/07/23 0954       Psychosocial Evaluation & Interventions   Interventions Encouraged to exercise with the program and follow exercise prescription    Comments Mr. Penza is coming to cardiac rehab after an MI and Stent. He states he has been feeling better since his recent hospitalization. He has a trach and reports it is doing well. 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 He has a good support system and reports no stress concerns at this time.    Expected Outcomes Short: attend cardiac rehab for education and exercise Long: develop and maintain positive self care habits    Continue Psychosocial Services  Follow up  required by staff          Psychosocial Re-Evaluation:   Psychosocial Discharge (Final Psychosocial Re-Evaluation):   Vocational Rehabilitation: Provide vocational rehab assistance to qualifying candidates.   Vocational Rehab Evaluation & Intervention:  Vocational Rehab - 12/07/23 0940       Initial Vocational Rehab Evaluation & Intervention   Assessment shows need for Vocational Rehabilitation No          Education: Education Goals: Education classes will be provided on a variety of topics geared toward better understanding of heart health and risk factor modification. Participant will state understanding/return demonstration of topics presented as noted by education test scores.  Learning Barriers/Preferences:  Learning Barriers/Preferences - 12/07/23 0940       Learning Barriers/Preferences   Learning Barriers None    Learning Preferences None          General Cardiac Education Topics:  AED/CPR: - Group verbal and written instruction with the use of models to demonstrate the basic use of the AED with the basic ABC's of resuscitation.   Test and Procedures: - Group verbal and visual presentation and models provide information about basic cardiac anatomy and function. Reviews the testing methods done to diagnose heart disease and the outcomes of the test results. Describes the treatment choices: Medical Management, Angioplasty, or Coronary Bypass Surgery for treating various heart conditions including Myocardial Infarction, Angina, Valve Disease, and Cardiac Arrhythmias. Written material provided at class time. Flowsheet Row Cardiac Rehab from 12/12/2023 in [facility] Cardiac and Pulmonary Rehab  Education need identified 12/12/23    Medication Safety: - Group verbal and visual instruction to review commonly prescribed medications for heart and lung disease. Reviews the medication, class of the drug, and side effects. Includes the steps to properly store meds and  maintain the prescription regimen. Written material provided at class time.   Intimacy: - Group verbal instruction through game format to discuss how heart and lung disease can affect sexual intimacy. Written material provided at class time.   Know Your Numbers and Heart Failure: - Group verbal and visual instruction to discuss disease risk factors for cardiac and pulmonary disease and treatment options.  Reviews associated critical values for Overweight/Obesity, Hypertension, Cholesterol, and Diabetes.  Discusses basics of heart failure: signs/symptoms and treatments.  Introduces Heart Failure Zone chart for action plan for heart failure. Written material provided at class time.   Infection Prevention: - Provides verbal and written material to individual with discussion of infection control including proper hand washing and proper equipment cleaning during exercise session. Flowsheet Row Cardiac Rehab from 12/12/2023 in [facility] Cardiac and Pulmonary Rehab  Date 12/12/23  Educator MB  Instruction Review Code 1- Verbalizes Understanding    Falls Prevention: - Provides verbal and written material to individual with discussion of falls prevention and safety. Flowsheet Row Cardiac Rehab from 12/12/2023 in [facility] Cardiac and Pulmonary Rehab  Date 12/12/23  Educator MB  Instruction Review Code 1- Verbalizes Understanding    Other: -Provides group and verbal instruction on various topics (see comments)   Knowledge Questionnaire Score:  Knowledge Questionnaire Score - 12/12/23 1111       Knowledge Questionnaire Score   Pre Score 23/26  Core Components/Risk Factors/Patient Goals at Admission:  Personal Goals and Risk Factors at Admission - 12/12/23 1112       Core Components/Risk Factors/Patient Goals on Admission    Weight Management Yes;Weight Loss    Intervention Weight Management: Develop a combined nutrition and exercise program designed to reach desired caloric intake,  while maintaining appropriate intake of nutrient and fiber, sodium and fats, and appropriate energy expenditure required for the weight goal.;Weight Management: Provide education and appropriate resources to help participant work on and attain dietary goals.;Weight Management/Obesity: Establish reasonable short term and long term weight goals.    Admit Weight 207 lb 12.8 oz (94.3 kg)    Goal Weight: Short Term 205 lb (93 kg)    Goal Weight: Long Term 200 lb (90.7 kg)    Expected Outcomes Short Term: Continue to assess and modify interventions until short term weight is achieved;Long Term: Adherence to nutrition and physical activity/exercise program aimed toward attainment of established weight goal;Weight Loss: Understanding of general recommendations for a balanced deficit meal plan, which promotes 1-2 lb weight loss per week and includes a negative energy balance of [phone] kcal/d;Understanding recommendations for meals to include 15-35% energy as protein, 25-35% energy from fat, 35-60% energy from carbohydrates, less than 200mg  of dietary cholesterol, 20-35 gm of total fiber daily;Understanding of distribution of calorie intake throughout the day with the consumption of 4-5 meals/snacks    Diabetes Yes    Intervention Provide education about signs/symptoms and action to take for hypo/hyperglycemia.;Provide education about proper nutrition, including hydration, and aerobic/resistive exercise prescription along with prescribed medications to achieve blood glucose in normal ranges: Fasting glucose 65-99 mg/dL    Expected Outcomes Short Term: Participant verbalizes understanding of the signs/symptoms and immediate care of hyper/hypoglycemia, proper foot care and importance of medication, aerobic/resistive exercise and nutrition plan for blood glucose control.;Long Term: Attainment of HbA1C < 7%.    Lipids Yes    Intervention Provide education and support for participant on nutrition & aerobic/resistive  exercise along with prescribed medications to achieve LDL 70mg , HDL >40mg .    Expected Outcomes Short Term: Participant states understanding of desired cholesterol values and is compliant with medications prescribed. Participant is following exercise prescription and nutrition guidelines.;Long Term: Cholesterol controlled with medications as prescribed, with individualized exercise RX and with personalized nutrition plan. Value goals: LDL < 70mg , HDL > 40 mg.          Education:Diabetes - Individual verbal and written instruction to review signs/symptoms of diabetes, desired ranges of glucose level fasting, after meals and with exercise. Acknowledge that pre and post exercise glucose checks will be done for 3 sessions at entry of program. Flowsheet Row Cardiac Rehab from 12/12/2023 in [facility] Cardiac and Pulmonary Rehab  Date 12/12/23  Educator MB  Instruction Review Code 1- Verbalizes Understanding    Core Components/Risk Factors/Patient Goals Review:    Core Components/Risk Factors/Patient Goals at Discharge (Final Review):    ITP Comments:  ITP Comments     Row Name 12/07/23 0954 12/12/23 1058         ITP Comments Initial phone call completed. Diagnosis can be found in [facility] 11/19. EP Orientation scheduled for Monday 12/1 at 9am. Completed and gym orientation for cardiac rehab. Initial ITP created and sent for review to Dr. Oneil Pinal, Medical Director.         Comments: Initial ITP

## 2023-12-19 ENCOUNTER — Encounter

## 2023-12-21 ENCOUNTER — Encounter

## 2023-12-21 DIAGNOSIS — Z9861 Coronary angioplasty status: Secondary | ICD-10-CM | POA: Diagnosis not present

## 2023-12-21 DIAGNOSIS — I5020 Unspecified systolic (congestive) heart failure: Secondary | ICD-10-CM | POA: Diagnosis not present

## 2023-12-21 DIAGNOSIS — I959 Hypotension, unspecified: Secondary | ICD-10-CM | POA: Diagnosis not present

## 2023-12-21 DIAGNOSIS — E7849 Other hyperlipidemia: Secondary | ICD-10-CM | POA: Diagnosis not present

## 2023-12-21 DIAGNOSIS — R9431 Abnormal electrocardiogram [ECG] [EKG]: Secondary | ICD-10-CM | POA: Diagnosis not present

## 2023-12-21 DIAGNOSIS — I25110 Atherosclerotic heart disease of native coronary artery with unstable angina pectoris: Secondary | ICD-10-CM | POA: Diagnosis not present

## 2023-12-21 DIAGNOSIS — I214 Non-ST elevation (NSTEMI) myocardial infarction: Secondary | ICD-10-CM | POA: Diagnosis not present

## 2023-12-21 DIAGNOSIS — I77810 Thoracic aortic ectasia: Secondary | ICD-10-CM | POA: Diagnosis not present

## 2023-12-21 LAB — GLUCOSE, CAPILLARY
Glucose-Capillary: 114 mg/dL — ABNORMAL HIGH (ref 70–99)
Glucose-Capillary: 82 mg/dL (ref 70–99)

## 2023-12-21 NOTE — Progress Notes (Signed)
 Daily Session Note  Patient Details  Name: Luke [name] MRN: [id number] Date of Birth: 1950/11/01 Referring Provider:   Flowsheet Row Cardiac Rehab from 12/12/2023 in [facility] Cardiac and Pulmonary Rehab  Referring Provider [name], MD    Encounter Date: 12/21/2023  Check In:  Session Check In - 12/21/23 1113       Check-In   Supervising physician immediately available to respond to emergencies See telemetry face sheet for immediately available ER MD    Location [facility]-Cardiac & Pulmonary Rehab    Staff Present Burnard Davenport RN,BSN,MPA;[facility] RCP,RRT,BSRT;Laura Cates RN,BSN;Noah Tickle, [place], Exercise Physiologist    Virtual Visit No    Medication changes reported     No    Fall or balance concerns reported    No    Warm-up and Cool-down Performed on first and last piece of equipment    Resistance Training Performed Yes    VAD Patient? No    PAD/SET Patient? No      Pain Assessment   Currently in Pain? No/denies             Social History   Tobacco Use  Smoking Status Former   Types: Cigarettes   Start date: 1970  Smokeless Tobacco Former  Tobacco Comments   05/03/19- down to half a pack or less / day    Goals Met:  Independence with exercise equipment Exercise tolerated well No report of concerns or symptoms today Strength training completed today  Goals Unmet:  Not Applicable  Comments: First full day of exercise!  Patient was oriented to gym and equipment including functions, settings, policies, and procedures.  Patient's individual exercise prescription and treatment plan were reviewed.  All starting workloads were established based on the results of the 6 minute walk test done at initial orientation visit.  The plan for exercise progression was also introduced and progression will be customized based on patient's performance and goals.    Dr. Oneil Pinal is Medical Director for [facility] Cardiac Rehabilitation.  Dr. Fuad Aleskerov is Medical  Director for [facility] Pulmonary Rehabilitation.

## 2023-12-23 ENCOUNTER — Encounter: Admitting: Emergency Medicine

## 2023-12-23 DIAGNOSIS — I214 Non-ST elevation (NSTEMI) myocardial infarction: Secondary | ICD-10-CM | POA: Diagnosis not present

## 2023-12-23 DIAGNOSIS — Z9861 Coronary angioplasty status: Secondary | ICD-10-CM

## 2023-12-23 LAB — GLUCOSE, CAPILLARY
Glucose-Capillary: 110 mg/dL — ABNORMAL HIGH (ref 70–99)
Glucose-Capillary: 90 mg/dL (ref 70–99)

## 2023-12-23 NOTE — Progress Notes (Signed)
 Daily Session Note  Patient Details  Name: Luke [name] MRN: [id number] Date of Birth: August 08, 1950 Referring Provider:   Flowsheet Row Cardiac Rehab from 12/12/2023 in [facility] Cardiac and Pulmonary Rehab  Referring Provider [name], MD    Encounter Date: 12/23/2023  Check In:  Session Check In - 12/23/23 1121       Check-In   Supervising physician immediately available to respond to emergencies See telemetry face sheet for immediately available ER MD    Location [facility]-Cardiac & Pulmonary Rehab    Staff Present Leita Franks RN,BSN;Joseph [facility] BS, Exercise Physiologist;Noah Tickle, BS, Exercise Physiologist    Virtual Visit No    Medication changes reported     No    Fall or balance concerns reported    No    Warm-up and Cool-down Performed on first and last piece of equipment    Resistance Training Performed Yes    VAD Patient? No    PAD/SET Patient? No      Pain Assessment   Currently in Pain? No/denies             Tobacco Use History[1]  Goals Met:  Independence with exercise equipment Exercise tolerated well No report of concerns or symptoms today Strength training completed today  Goals Unmet:  Not Applicable  Comments: Pt able to follow exercise prescription today without complaint.  Will continue to monitor for progression.    Dr. Oneil Pinal is Medical Director for [facility] Cardiac Rehabilitation.  Dr. Fuad Aleskerov is Medical Director for [facility] Pulmonary Rehabilitation.    [1]  Social History Tobacco Use  Smoking Status Former   Types: Cigarettes   Start date: 1970  Smokeless Tobacco Former  Tobacco Comments   05/03/19- down to half a pack or less / day

## 2023-12-26 ENCOUNTER — Encounter

## 2023-12-26 DIAGNOSIS — Z9861 Coronary angioplasty status: Secondary | ICD-10-CM

## 2023-12-26 DIAGNOSIS — I214 Non-ST elevation (NSTEMI) myocardial infarction: Secondary | ICD-10-CM

## 2023-12-26 LAB — GLUCOSE, CAPILLARY
Glucose-Capillary: 149 mg/dL — ABNORMAL HIGH (ref 70–99)
Glucose-Capillary: 86 mg/dL (ref 70–99)

## 2023-12-26 NOTE — Progress Notes (Signed)
 Daily Session Note  Patient Details  Name: Luke [name] MRN: [id number] Date of Birth: 07-11-1950 Referring Provider:   Flowsheet Row Cardiac Rehab from 12/12/2023 in [facility] Cardiac and Pulmonary Rehab  Referring Provider [name], MD    Encounter Date: 12/26/2023  Check In:  Session Check In - 12/26/23 1107       Check-In   Supervising physician immediately available to respond to emergencies See telemetry face sheet for immediately available ER MD    Location [facility]-Cardiac & Pulmonary Rehab    Staff Present Burnard Davenport RN,BSN,MPA;Joseph Rolinda RCP,RRT,BSRT;Laura Cates RN,BSN;Amada Hallisey Dyane BS, ACSM CEP, Exercise Physiologist    Virtual Visit No    Medication changes reported     No    Fall or balance concerns reported    No    Warm-up and Cool-down Performed on first and last piece of equipment    Resistance Training Performed Yes    VAD Patient? No    PAD/SET Patient? No      Pain Assessment   Currently in Pain? No/denies             Tobacco Use History[1]  Goals Met:  Independence with exercise equipment Exercise tolerated well No report of concerns or symptoms today Strength training completed today  Goals Unmet:  Not Applicable  Comments: Pt able to follow exercise prescription today without complaint.  Will continue to monitor for progression.    Dr. Oneil Pinal is Medical Director for [facility] Cardiac Rehabilitation.  Dr. Fuad Aleskerov is Medical Director for [facility] Pulmonary Rehabilitation.    [1]  Social History Tobacco Use  Smoking Status Former   Types: Cigarettes   Start date: 1970  Smokeless Tobacco Former  Tobacco Comments   05/03/19- down to half a pack or less / day

## 2023-12-28 ENCOUNTER — Encounter

## 2023-12-28 DIAGNOSIS — Z9861 Coronary angioplasty status: Secondary | ICD-10-CM

## 2023-12-28 DIAGNOSIS — I214 Non-ST elevation (NSTEMI) myocardial infarction: Secondary | ICD-10-CM

## 2023-12-28 NOTE — Progress Notes (Signed)
 Daily Session Note  Patient Details  Name: Luke [name] MRN: [id number] Date of Birth: 1950/09/10 Referring Provider:   Flowsheet Row Cardiac Rehab from 12/12/2023 in [facility] Cardiac and Pulmonary Rehab  Referring Provider [name], MD    Encounter Date: 12/28/2023  Check In:  Session Check In - 12/28/23 1107       Check-In   Supervising physician immediately available to respond to emergencies See telemetry face sheet for immediately available ER MD    Location [facility]-Cardiac & Pulmonary Rehab    Staff Present Burnard Davenport RN,BSN,MPA;Meredith Tressa RN,BSN;Joseph Rolinda RCP,RRT,BSRT;Laura Cates RN,BSN    Virtual Visit No    Medication changes reported     No    Fall or balance concerns reported    No    Warm-up and Cool-down Performed on first and last piece of equipment    Resistance Training Performed Yes    VAD Patient? No    PAD/SET Patient? No      Pain Assessment   Currently in Pain? No/denies             Tobacco Use History[1]  Goals Met:  Independence with exercise equipment Exercise tolerated well No report of concerns or symptoms today Strength training completed today  Goals Unmet:  Not Applicable  Comments: Pt able to follow exercise prescription today without complaint.  Will continue to monitor for progression.    Dr. Oneil Pinal is Medical Director for [facility] Cardiac Rehabilitation.  Dr. Fuad Aleskerov is Medical Director for [facility] Pulmonary Rehabilitation.    [1]  Social History Tobacco Use  Smoking Status Former   Types: Cigarettes   Start date: 1970  Smokeless Tobacco Former  Tobacco Comments   05/03/19- down to half a pack or less / day

## 2023-12-28 NOTE — Progress Notes (Signed)
 Cardiac Individual Treatment Plan  Patient Details  Name: Luke [name] MRN: [id number] Date of Birth: Aug 24, 1950 Referring Provider:   Flowsheet Row Cardiac Rehab from 12/12/2023 in [facility] Cardiac and Pulmonary Rehab  Referring Provider [name], MD    Initial Encounter Date:  Flowsheet Row Cardiac Rehab from 12/12/2023 in [facility] Cardiac and Pulmonary Rehab  Date 12/12/23    Visit Diagnosis: Status post percutaneous coronary intervention (PCI)  NSTEMI (non-ST elevated myocardial infarction) (HCC)  Patient's Home Medications on Admission: Current Medications[1]  Past Medical History: Past Medical History:  Diagnosis Date   Anxiety    Arthritis    Carcinoma metastatic to lymph node (HCC)    Cataract    right   COPD (chronic obstructive pulmonary disease) (HCC)    Depression    GERD (gastroesophageal reflux disease)    Hoarseness of voice    Personal history of tobacco use, presenting hazards to health February of 2001   Thyroid  disease     Tobacco Use: Tobacco Use History[2]  Labs: Review Flowsheet  More data exists      Latest Ref Rng & Units 06/01/2021 01/08/2022 08/20/2022 05/10/2023 10/21/2023  Labs for ITP Cardiac and Pulmonary Rehab  Cholestrol 100 - 199 mg/dL 797  800  766  811  -  LDL (calc) 0 - 99 mg/dL 858  871  862  881  -  HDL-C >39 mg/dL 33  30  33  35  -  Trlycerides 0 - 149 mg/dL 844  768  654  803  -  Hemoglobin A1c 4.8 - 5.6 % 6.0  6.3  - 6.7  6.0      Exercise Target Goals: Exercise Program Goal: Individual exercise prescription set using results from initial 6 min walk test and THRR while considering  patients activity barriers and safety.   Exercise Prescription Goal: Initial exercise prescription builds to 30-45 minutes a day of aerobic activity, 2-3 days per week.  Home exercise guidelines will be given to patient during program as part of exercise prescription that the participant will acknowledge.   Education: Aerobic Exercise: - Group  verbal and visual presentation on the components of exercise prescription. Introduces F.I.T.T principle from ACSM for exercise prescriptions.  Reviews F.I.T.T. principles of aerobic exercise including progression. Written material provided at class time. Flowsheet Row Cardiac Rehab from 12/12/2023 in [facility] Cardiac and Pulmonary Rehab  Education need identified 12/12/23    Education: Resistance Exercise: - Group verbal and visual presentation on the components of exercise prescription. Introduces F.I.T.T principle from ACSM for exercise prescriptions  Reviews F.I.T.T. principles of resistance exercise including progression. Written material provided at class time.    Education: Exercise & Equipment Safety: - Individual verbal instruction and demonstration of equipment use and safety with use of the equipment. Flowsheet Row Cardiac Rehab from 12/12/2023 in [facility] Cardiac and Pulmonary Rehab  Date 12/12/23  Educator MB  Instruction Review Code 1- Verbalizes Understanding    Education: Exercise Physiology & General Exercise Guidelines: - Group verbal and written instruction with models to review the exercise physiology of the cardiovascular system and associated critical values. Provides general exercise guidelines with specific guidelines to those with heart or lung disease. Written material provided at class time.   Education: Flexibility, Balance, Mind/Body Relaxation: - Group verbal and visual presentation with interactive activity on the components of exercise prescription. Introduces F.I.T.T principle from ACSM for exercise prescriptions. Reviews F.I.T.T. principles of flexibility and balance exercise training including progression. Also discusses the mind body  connection.  Reviews various relaxation techniques to help reduce and manage stress (i.e. Deep breathing, progressive muscle relaxation, and visualization). Balance handout provided to take home. Written material provided at class  time.   Activity Barriers & Risk Stratification:  Activity Barriers & Cardiac Risk Stratification - 12/12/23 1059       Activity Barriers & Cardiac Risk Stratification   Activity Barriers Back Problems;Deconditioning    Cardiac Risk Stratification High          6 Minute Walk:  6 Minute Walk     Row Name 12/12/23 1059         6 Minute Walk   Phase Initial     Distance 1120 feet     Walk Time 6 minutes     # of Rest Breaks 0     MPH 2.12     METS 2.45     RPE 11     Perceived Dyspnea  0     VO2 Peak 8.57     Symptoms No     Resting HR 71 bpm     Resting BP 84/60     Resting Oxygen  Saturation  95 %     Exercise Oxygen  Saturation  during 6 min walk 97 %     Max Ex. HR 92 bpm     Max Ex. BP 122/60     2 Minute Post BP 116/78        Oxygen  Initial Assessment:   Oxygen  Re-Evaluation:   Oxygen  Discharge (Final Oxygen  Re-Evaluation):   Initial Exercise Prescription:  Initial Exercise Prescription - 12/12/23 1100       Date of Initial Exercise RX and Referring Provider   Date 12/12/23    Referring Provider [name], MD      Oxygen    Maintain Oxygen  Saturation 88% or higher      NuStep   Level 2   T4 and T6   SPM 80    Minutes 15    METs 2.45      REL-XR   Level 1    Speed 50    Minutes 15    METs 2.45      T5 Nustep   Level 2    SPM 80    Minutes 15    METs 2.45      Track   Laps 27    Minutes 15    METs 2.47      Prescription Details   Frequency (times per week) 3    Duration Progress to 30 minutes of continuous aerobic without signs/symptoms of physical distress      Intensity   THRR 40-80% of Max Heartrate 102-133    Ratings of Perceived Exertion 11-13    Perceived Dyspnea 0-4      Progression   Progression Continue to progress workloads to maintain intensity without signs/symptoms of physical distress.      Resistance Training   Training Prescription Yes    Weight 3 lb    Reps 10-15          Perform Capillary Blood  Glucose checks as needed.  Exercise Prescription Changes:   Exercise Prescription Changes     Row Name 12/12/23 1100             Response to Exercise   Blood Pressure (Admit) 84/60       Blood Pressure (Exercise) 122/60       Blood Pressure (Exit) 116/78       Heart Rate (  Admit) 71 bpm       Heart Rate (Exercise) 92 bpm       Heart Rate (Exit) 72 bpm       Oxygen  Saturation (Admit) 95 %       Oxygen  Saturation (Exercise) 97 %       Oxygen  Saturation (Exit) 97 %       Rating of Perceived Exertion (Exercise) 11       Perceived Dyspnea (Exercise) 0       Symptoms none       Comments results         Progression   Average METs 2.45          Exercise Comments:   Exercise Comments     Row Name 12/21/23 1114           Exercise Comments First full day of exercise!  Patient was oriented to gym and equipment including functions, settings, policies, and procedures.  Patient's individual exercise prescription and treatment plan were reviewed.  All starting workloads were established based on the results of the 6 minute walk test done at initial orientation visit.  The plan for exercise progression was also introduced and progression will be customized based on patient's performance and goals.          Exercise Goals and Review:   Exercise Goals     Row Name 12/12/23 1102             Exercise Goals   Increase Physical Activity Yes       Intervention Provide advice, education, support and counseling about physical activity/exercise needs.;Develop an individualized exercise prescription for aerobic and resistive training based on initial evaluation findings, risk stratification, comorbidities and participant's personal goals.       Expected Outcomes Short Term: Attend rehab on a regular basis to increase amount of physical activity.;Long Term: Add in home exercise to make exercise part of routine and to increase amount of physical activity.;Long Term: Exercising  regularly at least 3-5 days a week.       Increase Strength and Stamina Yes       Intervention Provide advice, education, support and counseling about physical activity/exercise needs.;Develop an individualized exercise prescription for aerobic and resistive training based on initial evaluation findings, risk stratification, comorbidities and participant's personal goals.       Expected Outcomes Short Term: Perform resistance training exercises routinely during rehab and add in resistance training at home;Short Term: Increase workloads from initial exercise prescription for resistance, speed, and METs.;Long Term: Improve cardiorespiratory fitness, muscular endurance and strength as measured by increased METs and functional capacity ( )       Able to understand and use rate of perceived exertion (RPE) scale Yes       Intervention Provide education and explanation on how to use RPE scale       Expected Outcomes Short Term: Able to use RPE daily in rehab to express subjective intensity level;Long Term:  Able to use RPE to guide intensity level when exercising independently       Able to understand and use Dyspnea scale Yes       Intervention Provide education and explanation on how to use Dyspnea scale       Expected Outcomes Short Term: Able to use Dyspnea scale daily in rehab to express subjective sense of shortness of breath during exertion;Long Term: Able to use Dyspnea scale to guide intensity level when exercising independently       Knowledge  and understanding of Target Heart Rate Range (THRR) Yes       Intervention Provide education and explanation of THRR including how the numbers were predicted and where they are located for reference       Expected Outcomes Short Term: Able to state/look up THRR;Short Term: Able to use daily as guideline for intensity in rehab;Long Term: Able to use THRR to govern intensity when exercising independently       Able to check pulse independently Yes        Intervention Provide education and demonstration on how to check pulse in carotid and radial arteries.;Review the importance of being able to check your own pulse for safety during independent exercise       Expected Outcomes Short Term: Able to explain why pulse checking is important during independent exercise;Long Term: Able to check pulse independently and accurately       Understanding of Exercise Prescription Yes       Intervention Provide education, explanation, and written materials on patient's individual exercise prescription       Expected Outcomes Short Term: Able to explain program exercise prescription;Long Term: Able to explain home exercise prescription to exercise independently          Exercise Goals Re-Evaluation :  Exercise Goals Re-Evaluation     Row Name 12/21/23 1114             Exercise Goal Re-Evaluation   Exercise Goals Review Increase Physical Activity;Able to understand and use rate of perceived exertion (RPE) scale;Knowledge and understanding of Target Heart Rate Range (THRR);Understanding of Exercise Prescription;Increase Strength and Stamina;Able to check pulse independently;Able to understand and use Dyspnea scale       Comments Reviewed RPE and dyspnea scale, THR and program prescription with pt today.  Pt voiced understanding and was given a copy of goals to take home.       Expected Outcomes Short: Use RPE daily to regulate intensity. Long: Follow program prescription in THR.          Discharge Exercise Prescription (Final Exercise Prescription Changes):  Exercise Prescription Changes - 12/12/23 1100       Response to Exercise   Blood Pressure (Admit) 84/60    Blood Pressure (Exercise) 122/60    Blood Pressure (Exit) 116/78    Heart Rate (Admit) 71 bpm    Heart Rate (Exercise) 92 bpm    Heart Rate (Exit) 72 bpm    Oxygen  Saturation (Admit) 95 %    Oxygen  Saturation (Exercise) 97 %    Oxygen  Saturation (Exit) 97 %    Rating of Perceived Exertion  (Exercise) 11    Perceived Dyspnea (Exercise) 0    Symptoms none    Comments results      Progression   Average METs 2.45          Nutrition:  Target Goals: Understanding of nutrition guidelines, daily intake of sodium 1500mg , cholesterol 200mg , calories 30% from fat and 7% or less from saturated fats, daily to have 5 or more servings of fruits and vegetables.  Education: Nutrition 1 -Group instruction provided by verbal, written material, interactive activities, discussions, models, and posters to present general guidelines for heart healthy nutrition including macronutrients, label reading, and promoting whole foods over processed counterparts. Education serves as pensions consultant of discussion of heart healthy eating for all. Written material provided at class time.    Education: Nutrition 2 -Group instruction provided by verbal, written material, interactive activities, discussions, models, and posters to  present general guidelines for heart healthy nutrition including sodium, cholesterol, and saturated fat. Providing guidance of habit forming to improve blood pressure, cholesterol, and body weight. Written material provided at class time.     Biometrics:  Pre Biometrics - 12/12/23 1103       Pre Biometrics   Height 6' 0.4 (1.839 m)    Weight 207 lb 12.8 oz (94.3 kg)    Waist Circumference 45.6 inches    Hip Circumference 41.8 inches    Waist to Hip Ratio 1.09 %    BMI (Calculated) 27.87    Single Leg Stand 22.6 seconds           Nutrition Therapy Plan and Nutrition Goals:  Nutrition Therapy & Goals - 12/12/23 1110       Nutrition Therapy   RD appointment deferred Yes      Personal Nutrition Goals   Nutrition Goal RD appointment deferred at this time      Intervention Plan   Intervention Prescribe, educate and counsel regarding individualized specific dietary modifications aiming towards targeted core components such as weight, hypertension, lipid  management, diabetes, heart failure and other comorbidities.    Expected Outcomes Short Term Goal: Understand basic principles of dietary content, such as calories, fat, sodium, cholesterol and nutrients.          Nutrition Assessments:  MEDIFICTS Score Key: >=70 Need to make dietary changes  40-70 Heart Healthy Diet <= 40 Therapeutic Level Cholesterol Diet  Flowsheet Row Cardiac Rehab from 12/12/2023 in [facility] Cardiac and Pulmonary Rehab  Picture Your Plate Total Score on Admission 44   Picture Your Plate Scores: <59 Unhealthy dietary pattern with much room for improvement. 41-50 Dietary pattern unlikely to meet recommendations for good health and room for improvement. 51-60 More healthful dietary pattern, with some room for improvement.  >60 Healthy dietary pattern, although there may be some specific behaviors that could be improved.    Nutrition Goals Re-Evaluation:   Nutrition Goals Discharge (Final Nutrition Goals Re-Evaluation):   Psychosocial: Target Goals: Acknowledge presence or absence of significant depression and/or stress, maximize coping skills, provide positive support system. Participant is able to verbalize types and ability to use techniques and skills needed for reducing stress and depression.   Education: Stress, Anxiety, and Depression - Group verbal and visual presentation to define topics covered.  Reviews how body is impacted by stress, anxiety, and depression.  Also discusses healthy ways to reduce stress and to treat/manage anxiety and depression. Written material provided at class time.   Education: Sleep Hygiene -Provides group verbal and written instruction about how sleep can affect your health.  Define sleep hygiene, discuss sleep cycles and impact of sleep habits. Review good sleep hygiene tips.   Initial Review & Psychosocial Screening:  Initial Psych Review & Screening - 12/07/23 0941       Initial Review   Current issues with None  Identified      Family Dynamics   Good Support System? Yes   wife     Barriers   Psychosocial barriers to participate in program There are no identifiable barriers or psychosocial needs.;The patient should benefit from training in stress management and relaxation.      Screening Interventions   Interventions Encouraged to exercise;Provide feedback about the scores to participant;To provide support and resources with identified psychosocial needs    Expected Outcomes Short Term goal: Utilizing psychosocial counselor, staff and physician to assist with identification of specific Stressors or current issues interfering with healing  process. Setting desired goal for each stressor or current issue identified.;Long Term Goal: Stressors or current issues are controlled or eliminated.;Short Term goal: Identification and review with participant of any Quality of Life or Depression concerns found by scoring the questionnaire.;Long Term goal: The participant improves quality of Life and PHQ9 Scores as seen by post scores and/or verbalization of changes          Quality of Life Scores:   Quality of Life - 12/12/23 1111       Quality of Life   Select Quality of Life      Quality of Life Scores   Health/Function Pre 26 %    Socioeconomic Pre 30 %    Psych/Spiritual Pre 30 %    Family Pre 30 %    GLOBAL Pre 28.24 %         Scores of 19 and below usually indicate a poorer quality of life in these areas.  A difference of  2-3 points is a clinically meaningful difference.  A difference of 2-3 points in the total score of the Quality of Life Index has been associated with significant improvement in overall quality of life, self-image, physical symptoms, and general health in studies assessing change in quality of life.  PHQ-9: Review Flowsheet  More data exists      12/12/2023 10/21/2023 09/26/2023 09/20/2023 08/11/2023  Depression screen PHQ 2/9  Decreased Interest 0 0 0 1 0  Down, Depressed,  Hopeless 0 0 0 1 0  PHQ - 2 Score 0 0 0 2 0  Altered sleeping 1 0 0 - 0  Tired, decreased energy 1 0 0 3 0  Change in appetite 1 0 0 0 0  Feeling bad or failure about yourself  0 0 0 1 0  Trouble concentrating 0 0 0 0 0  Moving slowly or fidgety/restless 1 0 0 0 0  Suicidal thoughts 0 0 0 0 0  PHQ-9 Score 4 0  0  - 0   Difficult doing work/chores Somewhat difficult - - Somewhat difficult -    Details       Data saved with a previous flowsheet row definition        Interpretation of Total Score  Total Score Depression Severity:  1-4 = Minimal depression, 5-9 = Mild depression, 10-14 = Moderate depression, 15-19 = Moderately severe depression, 20-27 = Severe depression   Psychosocial Evaluation and Intervention:  Psychosocial Evaluation - 12/07/23 0954       Psychosocial Evaluation & Interventions   Interventions Encouraged to exercise with the program and follow exercise prescription    Comments Mr. Lupton is coming to cardiac rehab after an MI and Stent. He states he has been feeling better since his recent hospitalization. He has a trach and reports it is doing well. 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 He has a good support system and reports no stress concerns at this time.    Expected Outcomes Short: attend cardiac rehab for education and exercise Long: develop and maintain positive self care habits    Continue Psychosocial Services  Follow up required by staff          Psychosocial Re-Evaluation:   Psychosocial Discharge (Final Psychosocial Re-Evaluation):   Vocational Rehabilitation: Provide vocational rehab assistance to qualifying candidates.   Vocational Rehab Evaluation & Intervention:  Vocational Rehab - 12/07/23 0940       Initial Vocational Rehab Evaluation & Intervention   Assessment shows need for  Vocational Rehabilitation No          Education: Education Goals: Education classes will be provided on a variety of  topics geared toward better understanding of heart health and risk factor modification. Participant will state understanding/return demonstration of topics presented as noted by education test scores.  Learning Barriers/Preferences:  Learning Barriers/Preferences - 12/07/23 0940       Learning Barriers/Preferences   Learning Barriers None    Learning Preferences None          General Cardiac Education Topics:  AED/CPR: - Group verbal and written instruction with the use of models to demonstrate the basic use of the AED with the basic ABC's of resuscitation.   Test and Procedures: - Group verbal and visual presentation and models provide information about basic cardiac anatomy and function. Reviews the testing methods done to diagnose heart disease and the outcomes of the test results. Describes the treatment choices: Medical Management, Angioplasty, or Coronary Bypass Surgery for treating various heart conditions including Myocardial Infarction, Angina, Valve Disease, and Cardiac Arrhythmias. Written material provided at class time. Flowsheet Row Cardiac Rehab from 12/12/2023 in [facility] Cardiac and Pulmonary Rehab  Education need identified 12/12/23    Medication Safety: - Group verbal and visual instruction to review commonly prescribed medications for heart and lung disease. Reviews the medication, class of the drug, and side effects. Includes the steps to properly store meds and maintain the prescription regimen. Written material provided at class time.   Intimacy: - Group verbal instruction through game format to discuss how heart and lung disease can affect sexual intimacy. Written material provided at class time.   Know Your Numbers and Heart Failure: - Group verbal and visual instruction to discuss disease risk factors for cardiac and pulmonary disease and treatment options.  Reviews associated critical values for Overweight/Obesity, Hypertension, Cholesterol, and Diabetes.   Discusses basics of heart failure: signs/symptoms and treatments.  Introduces Heart Failure Zone chart for action plan for heart failure. Written material provided at class time.   Infection Prevention: - Provides verbal and written material to individual with discussion of infection control including proper hand washing and proper equipment cleaning during exercise session. Flowsheet Row Cardiac Rehab from 12/12/2023 in [facility] Cardiac and Pulmonary Rehab  Date 12/12/23  Educator MB  Instruction Review Code 1- Verbalizes Understanding    Falls Prevention: - Provides verbal and written material to individual with discussion of falls prevention and safety. Flowsheet Row Cardiac Rehab from 12/12/2023 in [facility] Cardiac and Pulmonary Rehab  Date 12/12/23  Educator MB  Instruction Review Code 1- Verbalizes Understanding    Other: -Provides group and verbal instruction on various topics (see comments)   Knowledge Questionnaire Score:  Knowledge Questionnaire Score - 12/12/23 1111       Knowledge Questionnaire Score   Pre Score 23/26          Core Components/Risk Factors/Patient Goals at Admission:  Personal Goals and Risk Factors at Admission - 12/12/23 1112       Core Components/Risk Factors/Patient Goals on Admission    Weight Management Yes;Weight Loss    Intervention Weight Management: Develop a combined nutrition and exercise program designed to reach desired caloric intake, while maintaining appropriate intake of nutrient and fiber, sodium and fats, and appropriate energy expenditure required for the weight goal.;Weight Management: Provide education and appropriate resources to help participant work on and attain dietary goals.;Weight Management/Obesity: Establish reasonable short term and long term weight goals.    Admit Weight 207 lb  12.8 oz (94.3 kg)    Goal Weight: Short Term 205 lb (93 kg)    Goal Weight: Long Term 200 lb (90.7 kg)    Expected Outcomes Short Term: Continue  to assess and modify interventions until short term weight is achieved;Long Term: Adherence to nutrition and physical activity/exercise program aimed toward attainment of established weight goal;Weight Loss: Understanding of general recommendations for a balanced deficit meal plan, which promotes 1-2 lb weight loss per week and includes a negative energy balance of [phone] kcal/d;Understanding recommendations for meals to include 15-35% energy as protein, 25-35% energy from fat, 35-60% energy from carbohydrates, less than 200mg  of dietary cholesterol, 20-35 gm of total fiber daily;Understanding of distribution of calorie intake throughout the day with the consumption of 4-5 meals/snacks    Diabetes Yes    Intervention Provide education about signs/symptoms and action to take for hypo/hyperglycemia.;Provide education about proper nutrition, including hydration, and aerobic/resistive exercise prescription along with prescribed medications to achieve blood glucose in normal ranges: Fasting glucose 65-99 mg/dL    Expected Outcomes Short Term: Participant verbalizes understanding of the signs/symptoms and immediate care of hyper/hypoglycemia, proper foot care and importance of medication, aerobic/resistive exercise and nutrition plan for blood glucose control.;Long Term: Attainment of HbA1C < 7%.    Lipids Yes    Intervention Provide education and support for participant on nutrition & aerobic/resistive exercise along with prescribed medications to achieve LDL 70mg , HDL >40mg .    Expected Outcomes Short Term: Participant states understanding of desired cholesterol values and is compliant with medications prescribed. Participant is following exercise prescription and nutrition guidelines.;Long Term: Cholesterol controlled with medications as prescribed, with individualized exercise RX and with personalized nutrition plan. Value goals: LDL < 70mg , HDL > 40 mg.          Education:Diabetes - Individual verbal  and written instruction to review signs/symptoms of diabetes, desired ranges of glucose level fasting, after meals and with exercise. Acknowledge that pre and post exercise glucose checks will be done for 3 sessions at entry of program. Flowsheet Row Cardiac Rehab from 12/12/2023 in [facility] Cardiac and Pulmonary Rehab  Date 12/12/23  Educator MB  Instruction Review Code 1- Verbalizes Understanding    Core Components/Risk Factors/Patient Goals Review:    Core Components/Risk Factors/Patient Goals at Discharge (Final Review):    ITP Comments:  ITP Comments     Row Name 12/07/23 0954 12/12/23 1058 12/21/23 1114 12/28/23 0954     ITP Comments Initial phone call completed. Diagnosis can be found in [facility] 11/19. EP Orientation scheduled for Monday 12/1 at 9am. Completed and gym orientation for cardiac rehab. Initial ITP created and sent for review to Dr. Oneil Pinal, Medical Director. First full day of exercise!  Patient was oriented to gym and equipment including functions, settings, policies, and procedures.  Patient's individual exercise prescription and treatment plan were reviewed.  All starting workloads were established based on the results of the 6 minute walk test done at initial orientation visit.  The plan for exercise progression was also introduced and progression will be customized based on patient's performance and goals. 30 Day review completed. Medical Director ITP review done, changes made as directed, and signed approval by Medical Director.       Comments: 30 day review    [1]  Current Outpatient Medications:    aspirin  EC 81 MG tablet, Take 1 tablet (81 mg total) by mouth daily. Swallow whole., Disp: 90 tablet, Rfl: 3   levothyroxine  (SYNTHROID ) 100 MCG tablet,  Take 1 tablet (100 mcg total) by mouth daily before breakfast., Disp: 90 tablet, Rfl: 3   lisinopril (ZESTRIL) 5 MG tablet, Take 5 mg by mouth., Disp: , Rfl:    metoprolol succinate (TOPROL-XL) 25 MG 24 hr tablet,  Take by mouth., Disp: , Rfl:    midodrine  (PROAMATINE ) 2.5 MG tablet, Take 1 tablet (2.5 mg total) by mouth 2 (two) times daily with a meal. For BP <100/70, Disp: 180 tablet, Rfl: 2   Multiple Vitamin (MULTI-VITAMIN) tablet, Take 1 tablet by mouth daily., Disp: , Rfl:    nitroGLYCERIN (NITROSTAT) 0.4 MG SL tablet, Place 0.4 mg under the tongue every 5 (five) minutes as needed for chest pain., Disp: , Rfl:    omeprazole  (PRILOSEC) 20 MG capsule, Take 1 capsule (20 mg total) by mouth daily., Disp: 90 capsule, Rfl: 3   predniSONE  (DELTASONE ) 50 MG tablet, Take 1 tablet (50 mg total) by mouth daily with breakfast., Disp: 5 tablet, Rfl: 0   rosuvastatin  (CRESTOR ) 40 MG tablet, Take 1 tablet (40 mg total) by mouth daily., Disp: 90 tablet, Rfl: 2   Semaglutide ,0.25 or 0.5MG /DOS, (OZEMPIC , 0.25 OR 0.5 MG/DOSE,) 2 MG/3ML SOPN, Inject 0.25 mg into the skin once a week., Disp: 9 mL, Rfl: 2   tadalafil  (CIALIS ) 5 MG tablet, Take 1 tablet (5 mg total) by mouth daily., Disp: 90 tablet, Rfl: 2   ticagrelor (BRILINTA) 90 MG TABS tablet, Take 90 mg by mouth 2 (two) times daily., Disp: , Rfl:    traZODone  (DESYREL ) 100 MG tablet, Take 1 tablet (100 mg total) by mouth at bedtime as needed for sleep., Disp: 90 tablet, Rfl: 2 [2]  Social History Tobacco Use  Smoking Status Former   Types: Cigarettes   Start date: 1970  Smokeless Tobacco Former  Tobacco Comments   05/03/19- down to half a pack or less / day

## 2023-12-30 ENCOUNTER — Encounter

## 2023-12-30 DIAGNOSIS — I214 Non-ST elevation (NSTEMI) myocardial infarction: Secondary | ICD-10-CM | POA: Diagnosis not present

## 2023-12-30 DIAGNOSIS — Z9861 Coronary angioplasty status: Secondary | ICD-10-CM

## 2023-12-30 NOTE — Progress Notes (Signed)
 Daily Session Note  Patient Details  Name: SAMIER JACO MRN: [id number] Date of Birth: 09-20-1950 Referring Provider:   Flowsheet Row Cardiac Rehab from 12/12/2023 in [facility] Cardiac and Pulmonary Rehab  Referring Provider [name], MD    Encounter Date: 12/30/2023  Check In:  Session Check In - 12/30/23 1211       Check-In   Supervising physician immediately available to respond to emergencies See telemetry face sheet for immediately available ER MD    Location [facility]-Cardiac & Pulmonary Rehab    Staff Present Maxon Conetta BS, Exercise Physiologist;Joseph Hood RCP,RRT,BSRT;Meredith Tressa RN,BSN;Ryhanna Dunsmore, RN, DNP, NE-BC    Virtual Visit No    Medication changes reported     No    Fall or balance concerns reported    No    Warm-up and Cool-down Performed on first and last piece of equipment    Resistance Training Performed Yes    VAD Patient? No    PAD/SET Patient? No      Pain Assessment   Currently in Pain? No/denies             Tobacco Use History[1]  Goals Met:  Independence with exercise equipment Exercise tolerated well No report of concerns or symptoms today Strength training completed today  Goals Unmet:  Not Applicable  Comments: Pt able to follow exercise prescription today without complaint.  Will continue to monitor for progression.    Dr. Oneil Pinal is Medical Director for [facility] Cardiac Rehabilitation.  Dr. Fuad Aleskerov is Medical Director for [facility] Pulmonary Rehabilitation.    [1]  Social History Tobacco Use  Smoking Status Former   Types: Cigarettes   Start date: 1970  Smokeless Tobacco Former  Tobacco Comments   05/03/19- down to half a pack or less / day

## 2024-01-02 ENCOUNTER — Encounter

## 2024-01-04 ENCOUNTER — Encounter

## 2024-01-09 ENCOUNTER — Encounter

## 2024-01-09 DIAGNOSIS — I214 Non-ST elevation (NSTEMI) myocardial infarction: Secondary | ICD-10-CM | POA: Diagnosis not present

## 2024-01-09 DIAGNOSIS — Z9861 Coronary angioplasty status: Secondary | ICD-10-CM

## 2024-01-09 NOTE — Progress Notes (Signed)
 Daily Session Note  Patient Details  Name: Luke [name] MRN: [id number] Date of Birth: 1950-06-26 Referring Provider:   Flowsheet Row Cardiac Rehab from 12/12/2023 in [facility] Cardiac and Pulmonary Rehab  Referring Provider [name], MD    Encounter Date: 01/09/2024  Check In:  Session Check In - 01/09/24 1113       Check-In   Supervising physician immediately available to respond to emergencies See telemetry face sheet for immediately available ER MD    Location [facility]-Cardiac & Pulmonary Rehab    Staff Present Burnard Davenport RN,BSN,MPA;Maxon Conetta BS, Exercise Physiologist;Joseph Rolinda NORWOOD HARMAN Cecilie Delores, BS, RRT, CPFT    Virtual Visit No    Medication changes reported     No    Fall or balance concerns reported    No    Warm-up and Cool-down Performed on first and last piece of equipment    Resistance Training Performed Yes    VAD Patient? No    PAD/SET Patient? No      Pain Assessment   Currently in Pain? No/denies             Tobacco Use History[1]  Goals Met:  Independence with exercise equipment Exercise tolerated well No report of concerns or symptoms today Strength training completed today  Goals Unmet:  Not Applicable  Comments: Pt able to follow exercise prescription today without complaint.  Will continue to monitor for progression.    Dr. Oneil Pinal is Medical Director for [facility] Cardiac Rehabilitation.  Dr. Fuad Aleskerov is Medical Director for [facility] Pulmonary Rehabilitation.    [1]  Social History Tobacco Use  Smoking Status Former   Types: Cigarettes   Start date: 1970  Smokeless Tobacco Former  Tobacco Comments   05/03/19- down to half a pack or less / day

## 2024-01-11 ENCOUNTER — Encounter

## 2024-01-11 DIAGNOSIS — I214 Non-ST elevation (NSTEMI) myocardial infarction: Secondary | ICD-10-CM | POA: Diagnosis not present

## 2024-01-11 DIAGNOSIS — Z9861 Coronary angioplasty status: Secondary | ICD-10-CM

## 2024-01-11 NOTE — Progress Notes (Signed)
 Daily Session Note  Patient Details  Name: Luke [name] MRN: [id number] Date of Birth: 25-Feb-1950 Referring Provider:   Flowsheet Row Cardiac Rehab from 12/12/2023 in [facility] Cardiac and Pulmonary Rehab  Referring Provider [name], MD    Encounter Date: 01/11/2024  Check In:  Session Check In - 01/11/24 1054       Check-In   Supervising physician immediately available to respond to emergencies See telemetry face sheet for immediately available ER MD    Location [facility]-Cardiac & Pulmonary Rehab    Staff Present Burnard Davenport RN,BSN,MPA;Joseph [facility] RCP,RRT,BSRT;Laura Cates RN,BSN;Margaret Best, MS, Exercise Physiologist;Noah Tickle, BS, Exercise Physiologist    Virtual Visit No    Medication changes reported     No    Fall or balance concerns reported    No    Warm-up and Cool-down Performed on first and last piece of equipment    Resistance Training Performed Yes    VAD Patient? No    PAD/SET Patient? No      Pain Assessment   Currently in Pain? No/denies             Tobacco Use History[1]  Goals Met:  Independence with exercise equipment Exercise tolerated well No report of concerns or symptoms today Strength training completed today  Goals Unmet:  Not Applicable  Comments: Pt able to follow exercise prescription today without complaint.  Will continue to monitor for progression.    Dr. Oneil Pinal is Medical Director for [facility] Cardiac Rehabilitation.  Dr. Fuad Aleskerov is Medical Director for [facility] Pulmonary Rehabilitation.    [1]  Social History Tobacco Use  Smoking Status Former   Types: Cigarettes   Start date: 1970  Smokeless Tobacco Former  Tobacco Comments   05/03/19- down to half a pack or less / day

## 2024-01-13 ENCOUNTER — Encounter: Attending: Cardiology | Admitting: Emergency Medicine

## 2024-01-13 DIAGNOSIS — Z9861 Coronary angioplasty status: Secondary | ICD-10-CM | POA: Diagnosis present

## 2024-01-13 DIAGNOSIS — I214 Non-ST elevation (NSTEMI) myocardial infarction: Secondary | ICD-10-CM

## 2024-01-13 DIAGNOSIS — Z48812 Encounter for surgical aftercare following surgery on the circulatory system: Secondary | ICD-10-CM | POA: Diagnosis present

## 2024-01-13 DIAGNOSIS — I252 Old myocardial infarction: Secondary | ICD-10-CM | POA: Diagnosis not present

## 2024-01-13 NOTE — Progress Notes (Signed)
 Daily Session Note  Patient Details  Name: Luke [name] MRN: [id number] Date of Birth: [birth year] Referring Provider:   Flowsheet Row Cardiac Rehab from 12/12/2023 in [facility] Cardiac and Pulmonary Rehab  Referring Provider [name], MD    Encounter Date: 01/13/2024  Check In:  Session Check In - 01/13/24 1109       Check-In   Supervising physician immediately available to respond to emergencies See telemetry face sheet for immediately available ER MD    Location [facility]-Cardiac & Pulmonary Rehab    Staff Present Fairy Plater RCP,RRT,BSRT;Maxon Conetta BS, Exercise Physiologist;Juline Sanderford RN,BSN;Laureen Delores, BS, RRT, CPFT    Virtual Visit No    Medication changes reported     No    Fall or balance concerns reported    No    Warm-up and Cool-down Performed on first and last piece of equipment    Resistance Training Performed Yes    VAD Patient? No    PAD/SET Patient? No      Pain Assessment   Currently in Pain? No/denies             Tobacco Use History[1]  Goals Met:  Independence with exercise equipment Exercise tolerated well No report of concerns or symptoms today Strength training completed today  Goals Unmet:  Not Applicable  Comments: Pt able to follow exercise prescription today without complaint.  Will continue to monitor for progression.    Dr. Oneil Pinal is Medical Director for [facility] Cardiac Rehabilitation.  Dr. Fuad Aleskerov is Medical Director for [facility] Pulmonary Rehabilitation.    [1]  Social History Tobacco Use  Smoking Status Former   Types: Cigarettes   Start date: 1970  Smokeless Tobacco Former  Tobacco Comments   05/03/19- down to half a pack or less / day

## 2024-01-16 ENCOUNTER — Encounter: Admitting: *Deleted

## 2024-01-16 DIAGNOSIS — Z9861 Coronary angioplasty status: Secondary | ICD-10-CM

## 2024-01-16 DIAGNOSIS — I214 Non-ST elevation (NSTEMI) myocardial infarction: Secondary | ICD-10-CM

## 2024-01-16 DIAGNOSIS — Z48812 Encounter for surgical aftercare following surgery on the circulatory system: Secondary | ICD-10-CM | POA: Diagnosis not present

## 2024-01-16 NOTE — Progress Notes (Signed)
 Daily Session Note  Patient Details  Name: ANDEN BARTOLO MRN: [id number] Date of Birth: 1950/05/19 Referring Provider:   Flowsheet Row Cardiac Rehab from 12/12/2023 in [facility] Cardiac and Pulmonary Rehab  Referring Provider [name], MD    Encounter Date: 01/16/2024  Check In:  Session Check In - 01/16/24 1121       Check-In   Supervising physician immediately available to respond to emergencies See telemetry face sheet for immediately available ER MD    Location [facility]-Cardiac & Pulmonary Rehab    Staff Present Othel Durand, RN, BSN, CCRP;Laura Cates RN,BSN;Joseph [employer] BS, ACSM CEP, Exercise Physiologist    Virtual Visit No    Medication changes reported     No    Fall or balance concerns reported    No    Warm-up and Cool-down Performed on first and last piece of equipment    Resistance Training Performed Yes    VAD Patient? No    PAD/SET Patient? No      Pain Assessment   Currently in Pain? No/denies             Tobacco Use History[1]  Goals Met:  Independence with exercise equipment Exercise tolerated well No report of concerns or symptoms today  Goals Unmet:  Not Applicable  Comments: Pt able to follow exercise prescription today without complaint.  Will continue to monitor for progression.    Dr. Oneil Pinal is Medical Director for [facility] Cardiac Rehabilitation.  Dr. Fuad Aleskerov is Medical Director for [facility] Pulmonary Rehabilitation.    [1]  Social History Tobacco Use  Smoking Status Former   Types: Cigarettes   Start date: 1970  Smokeless Tobacco Former  Tobacco Comments   05/03/19- down to half a pack or less / day

## 2024-01-18 ENCOUNTER — Encounter: Admitting: Emergency Medicine

## 2024-01-18 DIAGNOSIS — Z48812 Encounter for surgical aftercare following surgery on the circulatory system: Secondary | ICD-10-CM | POA: Diagnosis not present

## 2024-01-18 DIAGNOSIS — I214 Non-ST elevation (NSTEMI) myocardial infarction: Secondary | ICD-10-CM

## 2024-01-18 DIAGNOSIS — Z9861 Coronary angioplasty status: Secondary | ICD-10-CM

## 2024-01-18 NOTE — Progress Notes (Signed)
 Daily Session Note  Patient Details  Name: Luke [name] MRN: [id number] Date of Birth: Oct 02, 1950 Referring Provider:   Flowsheet Row Cardiac Rehab from 12/12/2023 in [facility] Cardiac and Pulmonary Rehab  Referring Provider [name], MD    Encounter Date: 01/18/2024  Check In:  Session Check In - 01/18/24 1131       Check-In   Supervising physician immediately available to respond to emergencies See telemetry face sheet for immediately available ER MD    Location [facility]-Cardiac & Pulmonary Rehab    Staff Present Leita Franks RN,BSN;Joseph [facility] BS, Exercise Physiologist;Margaret Best, MS, Exercise Physiologist    Virtual Visit No    Medication changes reported     No    Fall or balance concerns reported    No    Warm-up and Cool-down Performed on first and last piece of equipment    Resistance Training Performed Yes    VAD Patient? No    PAD/SET Patient? No      Pain Assessment   Currently in Pain? No/denies             Tobacco Use History[1]  Goals Met:  Independence with exercise equipment Exercise tolerated well No report of concerns or symptoms today Strength training completed today  Goals Unmet:  Not Applicable  Comments: Pt able to follow exercise prescription today without complaint.  Will continue to monitor for progression.    Dr. Oneil Pinal is Medical Director for [facility] Cardiac Rehabilitation.  Dr. Fuad Aleskerov is Medical Director for [facility] Pulmonary Rehabilitation.    [1]  Social History Tobacco Use  Smoking Status Former   Types: Cigarettes   Start date: 1970  Smokeless Tobacco Former  Tobacco Comments   05/03/19- down to half a pack or less / day

## 2024-01-20 ENCOUNTER — Encounter: Admitting: Emergency Medicine

## 2024-01-20 DIAGNOSIS — I214 Non-ST elevation (NSTEMI) myocardial infarction: Secondary | ICD-10-CM

## 2024-01-20 DIAGNOSIS — Z9861 Coronary angioplasty status: Secondary | ICD-10-CM

## 2024-01-20 DIAGNOSIS — Z48812 Encounter for surgical aftercare following surgery on the circulatory system: Secondary | ICD-10-CM | POA: Diagnosis not present

## 2024-01-20 NOTE — Progress Notes (Signed)
 Daily Session Note  Patient Details  Name: Luke [name] MRN: [id number] Date of Birth: [birth year] Referring Provider:   Flowsheet Row Cardiac Rehab from 12/12/2023 in [facility] Cardiac and Pulmonary Rehab  Referring Provider [name], MD    Encounter Date: 01/20/2024  Check In:  Session Check In - 01/20/24 1052       Check-In   Supervising physician immediately available to respond to emergencies See telemetry face sheet for immediately available ER MD    Location [facility]-Cardiac & Pulmonary Rehab    Staff Present Leita Franks RN,BSN;Joseph [facility] BS, Exercise Physiologist;Noah Tickle, BS, Exercise Physiologist    Virtual Visit No    Medication changes reported     No    Fall or balance concerns reported    No    Warm-up and Cool-down Performed on first and last piece of equipment    Resistance Training Performed Yes    VAD Patient? No    PAD/SET Patient? No      Pain Assessment   Currently in Pain? No/denies             Tobacco Use History[1]  Goals Met:  Independence with exercise equipment Exercise tolerated well No report of concerns or symptoms today Strength training completed today  Goals Unmet:  Not Applicable  Comments: Pt able to follow exercise prescription today without complaint.  Will continue to monitor for progression.    Dr. Oneil Pinal is Medical Director for [facility] Cardiac Rehabilitation.  Dr. Fuad Aleskerov is Medical Director for [facility] Pulmonary Rehabilitation.    [1]  Social History Tobacco Use  Smoking Status Former   Types: Cigarettes   Start date: 1970  Smokeless Tobacco Former  Tobacco Comments   05/03/19- down to half a pack or less / day

## 2024-01-23 ENCOUNTER — Encounter

## 2024-01-23 DIAGNOSIS — I214 Non-ST elevation (NSTEMI) myocardial infarction: Secondary | ICD-10-CM

## 2024-01-23 DIAGNOSIS — Z9861 Coronary angioplasty status: Secondary | ICD-10-CM

## 2024-01-23 DIAGNOSIS — Z48812 Encounter for surgical aftercare following surgery on the circulatory system: Secondary | ICD-10-CM | POA: Diagnosis not present

## 2024-01-23 NOTE — Progress Notes (Signed)
 Daily Session Note  Patient Details  Name: Luke [name] MRN: [id number] Date of Birth: Mar 12, 1950 Referring Provider:   Flowsheet Row Cardiac Rehab from 12/12/2023 in [facility] Cardiac and Pulmonary Rehab  Referring Provider [name], MD    Encounter Date: 01/23/2024  Check In:  Session Check In - 01/23/24 1105       Check-In   Supervising physician immediately available to respond to emergencies See telemetry face sheet for immediately available ER MD    Location [facility]-Cardiac & Pulmonary Rehab    Staff Present Burnard Davenport RN,BSN,MPA;Joseph Rolinda RCP,RRT,BSRT;Laura Cates RN,BSN;Kellis Mcadam Dyane BS, ACSM CEP, Exercise Physiologist    Virtual Visit No    Medication changes reported     No    Fall or balance concerns reported    No    Warm-up and Cool-down Performed on first and last piece of equipment    Resistance Training Performed Yes    VAD Patient? No    PAD/SET Patient? No      Pain Assessment   Currently in Pain? No/denies             Tobacco Use History[1]  Goals Met:  Independence with exercise equipment Exercise tolerated well No report of concerns or symptoms today Strength training completed today  Goals Unmet:  Not Applicable  Comments: Pt able to follow exercise prescription today without complaint.  Will continue to monitor for progression.    Dr. Oneil Pinal is Medical Director for [facility] Cardiac Rehabilitation.  Dr. Fuad Aleskerov is Medical Director for [facility] Pulmonary Rehabilitation.    [1]  Social History Tobacco Use  Smoking Status Former   Types: Cigarettes   Start date: 1970  Smokeless Tobacco Former  Tobacco Comments   05/03/19- down to half a pack or less / day

## 2024-01-24 ENCOUNTER — Other Ambulatory Visit (HOSPITAL_COMMUNITY): Payer: Self-pay

## 2024-01-25 ENCOUNTER — Encounter: Admitting: Emergency Medicine

## 2024-01-25 ENCOUNTER — Encounter: Payer: Self-pay | Admitting: *Deleted

## 2024-01-25 DIAGNOSIS — Z48812 Encounter for surgical aftercare following surgery on the circulatory system: Secondary | ICD-10-CM | POA: Diagnosis not present

## 2024-01-25 DIAGNOSIS — I214 Non-ST elevation (NSTEMI) myocardial infarction: Secondary | ICD-10-CM

## 2024-01-25 DIAGNOSIS — Z9861 Coronary angioplasty status: Secondary | ICD-10-CM

## 2024-01-25 NOTE — Progress Notes (Signed)
 Daily Session Note  Patient Details  Name: Luke [name] MRN: [id number] Date of Birth: 1950/12/13 Referring Provider:   Flowsheet Row Cardiac Rehab from 12/12/2023 in [facility] Cardiac and Pulmonary Rehab  Referring Provider [name], MD    Encounter Date: 01/25/2024  Check In:  Session Check In - 01/25/24 1108       Check-In   Supervising physician immediately available to respond to emergencies See telemetry face sheet for immediately available ER MD    Location [facility]-Cardiac & Pulmonary Rehab    Staff Present Leita Franks RN,BSN;Joseph [facility] BS, Exercise Physiologist;Margaret Best, MS, Exercise Physiologist    Virtual Visit No    Medication changes reported     No    Fall or balance concerns reported    No    Warm-up and Cool-down Performed on first and last piece of equipment    Resistance Training Performed Yes    VAD Patient? No    PAD/SET Patient? No      Pain Assessment   Currently in Pain? No/denies             Tobacco Use History[1]  Goals Met:  Independence with exercise equipment Exercise tolerated well No report of concerns or symptoms today Strength training completed today  Goals Unmet:  Not Applicable  Comments: Pt able to follow exercise prescription today without complaint.  Will continue to monitor for progression.    Dr. Oneil Pinal is Medical Director for [facility] Cardiac Rehabilitation.  Dr. Fuad Aleskerov is Medical Director for [facility] Pulmonary Rehabilitation.    [1]  Social History Tobacco Use  Smoking Status Former   Types: Cigarettes   Start date: 1970  Smokeless Tobacco Former  Tobacco Comments   05/03/19- down to half a pack or less / day

## 2024-01-25 NOTE — Progress Notes (Signed)
 Cardiac Individual Treatment Plan  Patient Details  Name: Luke [name] MRN: [id number] Date of Birth: 10/20/1950 Referring Provider:   Flowsheet Row Cardiac Rehab from 12/12/2023 in [facility] Cardiac and Pulmonary Rehab  Referring Provider [name], MD    Initial Encounter Date:  Flowsheet Row Cardiac Rehab from 12/12/2023 in [facility] Cardiac and Pulmonary Rehab  Date 12/12/23    Visit Diagnosis: NSTEMI (non-ST elevated myocardial infarction) [facility])  Status post percutaneous coronary intervention (PCI)  Patient's Home Medications on Admission: Current Medications[1]  Past Medical History: Past Medical History:  Diagnosis Date   Anxiety    Arthritis    Carcinoma metastatic to lymph node (HCC)    Cataract    right   COPD (chronic obstructive pulmonary disease) (HCC)    Depression    GERD (gastroesophageal reflux disease)    Hoarseness of voice    Personal history of tobacco use, presenting hazards to health February of 2001   Thyroid  disease     Tobacco Use: Tobacco Use History[2]  Labs: Review Flowsheet  More data exists      Latest Ref Rng & Units 06/01/2021 01/08/2022 08/20/2022 05/10/2023 10/21/2023  Labs for ITP Cardiac and Pulmonary Rehab  Cholestrol 100 - 199 mg/dL 797  800  766  811  -  LDL (calc) 0 - 99 mg/dL 858  871  862  881  -  HDL-C >39 mg/dL 33  30  33  35  -  Trlycerides 0 - 149 mg/dL 844  768  654  803  -  Hemoglobin A1c 4.8 - 5.6 % 6.0  6.3  - 6.7  6.0      Exercise Target Goals: Exercise Program Goal: Individual exercise prescription set using results from initial 6 min walk test and THRR while considering  patients activity barriers and safety.   Exercise Prescription Goal: Initial exercise prescription builds to 30-45 minutes a day of aerobic activity, 2-3 days per week.  Home exercise guidelines will be given to patient during program as part of exercise prescription that the participant will acknowledge.   Education: Aerobic Exercise: - Group  verbal and visual presentation on the components of exercise prescription. Introduces F.I.T.T principle from ACSM for exercise prescriptions.  Reviews F.I.T.T. principles of aerobic exercise including progression. Written material provided at class time. Flowsheet Row Cardiac Rehab from 12/12/2023 in [facility] Cardiac and Pulmonary Rehab  Education need identified 12/12/23    Education: Resistance Exercise: - Group verbal and visual presentation on the components of exercise prescription. Introduces F.I.T.T principle from ACSM for exercise prescriptions  Reviews F.I.T.T. principles of resistance exercise including progression. Written material provided at class time.    Education: Exercise & Equipment Safety: - Individual verbal instruction and demonstration of equipment use and safety with use of the equipment. Flowsheet Row Cardiac Rehab from 12/12/2023 in [facility] Cardiac and Pulmonary Rehab  Date 12/12/23  Educator MB  Instruction Review Code 1- Verbalizes Understanding    Education: Exercise Physiology & General Exercise Guidelines: - Group verbal and written instruction with models to review the exercise physiology of the cardiovascular system and associated critical values. Provides general exercise guidelines with specific guidelines to those with heart or lung disease. Written material provided at class time.   Education: Flexibility, Balance, Mind/Body Relaxation: - Group verbal and visual presentation with interactive activity on the components of exercise prescription. Introduces F.I.T.T principle from ACSM for exercise prescriptions. Reviews F.I.T.T. principles of flexibility and balance exercise training including progression. Also discusses the mind body  connection.  Reviews various relaxation techniques to help reduce and manage stress (i.e. Deep breathing, progressive muscle relaxation, and visualization). Balance handout provided to take home. Written material provided at class  time.   Activity Barriers & Risk Stratification:  Activity Barriers & Cardiac Risk Stratification - 12/12/23 1059       Activity Barriers & Cardiac Risk Stratification   Activity Barriers Back Problems;Deconditioning    Cardiac Risk Stratification High          6 Minute Walk:  6 Minute Walk     Row Name 12/12/23 1059         6 Minute Walk   Phase Initial     Distance 1120 feet     Walk Time 6 minutes     # of Rest Breaks 0     MPH 2.12     METS 2.45     RPE 11     Perceived Dyspnea  0     VO2 Peak 8.57     Symptoms No     Resting HR 71 bpm     Resting BP 84/60     Resting Oxygen  Saturation  95 %     Exercise Oxygen  Saturation  during 6 min walk 97 %     Max Ex. HR 92 bpm     Max Ex. BP 122/60     2 Minute Post BP 116/78        Oxygen  Initial Assessment:   Oxygen  Re-Evaluation:   Oxygen  Discharge (Final Oxygen  Re-Evaluation):   Initial Exercise Prescription:  Initial Exercise Prescription - 12/12/23 1100       Date of Initial Exercise RX and Referring Provider   Date 12/12/23    Referring Provider [name], MD      Oxygen    Maintain Oxygen  Saturation 88% or higher      NuStep   Level 2   T4 and T6   SPM 80    Minutes 15    METs 2.45      REL-XR   Level 1    Speed 50    Minutes 15    METs 2.45      T5 Nustep   Level 2    SPM 80    Minutes 15    METs 2.45      Track   Laps 27    Minutes 15    METs 2.47      Prescription Details   Frequency (times per week) 3    Duration Progress to 30 minutes of continuous aerobic without signs/symptoms of physical distress      Intensity   THRR 40-80% of Max Heartrate 102-133    Ratings of Perceived Exertion 11-13    Perceived Dyspnea 0-4      Progression   Progression Continue to progress workloads to maintain intensity without signs/symptoms of physical distress.      Resistance Training   Training Prescription Yes    Weight 3 lb    Reps 10-15          Perform Capillary Blood  Glucose checks as needed.  Exercise Prescription Changes:   Exercise Prescription Changes     Row Name 12/12/23 1100 12/29/23 1700 01/16/24 1500         Response to Exercise   Blood Pressure (Admit) 84/60 116/74 120/62     Blood Pressure (Exercise) 122/60 122/24 126/72     Blood Pressure (Exit) 116/78 94/56 104/62     Heart Rate (  Admit) 71 bpm 68 bpm 86 bpm     Heart Rate (Exercise) 92 bpm 100 bpm 104 bpm     Heart Rate (Exit) 72 bpm 71 bpm 96 bpm     Oxygen  Saturation (Admit) 95 % -- --     Oxygen  Saturation (Exercise) 97 % -- --     Oxygen  Saturation (Exit) 97 % -- --     Rating of Perceived Exertion (Exercise) 11 11 12      Perceived Dyspnea (Exercise) 0 -- --     Symptoms none none none     Comments results 1st week of exercise session --     Duration -- Progress to 30 minutes of  aerobic without signs/symptoms of physical distress Continue with 30 min of aerobic exercise without signs/symptoms of physical distress.     Intensity -- THRR unchanged THRR unchanged       Progression   Progression -- Continue to progress workloads to maintain intensity without signs/symptoms of physical distress. Continue to progress workloads to maintain intensity without signs/symptoms of physical distress.     Average METs 2.45 2.59 2.41       Resistance Training   Weight -- 3 lb 3 lb     Reps -- 10-15 10-15       Interval Training   Interval Training -- No No       Treadmill   MPH -- -- 1.6     Grade -- -- 0     Minutes -- -- 15     METs -- -- 2.23       NuStep   Level -- 2 2  T6 nustep     Minutes -- 15 15     METs -- 2.4 2       REL-XR   Level -- -- 1     Minutes -- -- 15     METs -- -- 4       Biostep-RELP   Level -- 2 2     SPM -- 50 --     Minutes -- 15 15     METs -- 3 --       Track   Laps -- 27 22     Minutes -- 15 15     METs -- 2.47 2.2       Oxygen    Maintain Oxygen  Saturation -- 88% or higher 88% or higher        Exercise Comments:   Exercise  Comments     Row Name 12/21/23 1114           Exercise Comments First full day of exercise!  Patient was oriented to gym and equipment including functions, settings, policies, and procedures.  Patient's individual exercise prescription and treatment plan were reviewed.  All starting workloads were established based on the results of the 6 minute walk test done at initial orientation visit.  The plan for exercise progression was also introduced and progression will be customized based on patient's performance and goals.          Exercise Goals and Review:   Exercise Goals     Row Name 12/12/23 1102             Exercise Goals   Increase Physical Activity Yes       Intervention Provide advice, education, support and counseling about physical activity/exercise needs.;Develop an individualized exercise prescription for aerobic and resistive training based on initial evaluation findings, risk stratification, comorbidities and participant's  personal goals.       Expected Outcomes Short Term: Attend rehab on a regular basis to increase amount of physical activity.;Long Term: Add in home exercise to make exercise part of routine and to increase amount of physical activity.;Long Term: Exercising regularly at least 3-5 days a week.       Increase Strength and Stamina Yes       Intervention Provide advice, education, support and counseling about physical activity/exercise needs.;Develop an individualized exercise prescription for aerobic and resistive training based on initial evaluation findings, risk stratification, comorbidities and participant's personal goals.       Expected Outcomes Short Term: Perform resistance training exercises routinely during rehab and add in resistance training at home;Short Term: Increase workloads from initial exercise prescription for resistance, speed, and METs.;Long Term: Improve cardiorespiratory fitness, muscular endurance and strength as measured by increased METs  and functional capacity ( )       Able to understand and use rate of perceived exertion (RPE) scale Yes       Intervention Provide education and explanation on how to use RPE scale       Expected Outcomes Short Term: Able to use RPE daily in rehab to express subjective intensity level;Long Term:  Able to use RPE to guide intensity level when exercising independently       Able to understand and use Dyspnea scale Yes       Intervention Provide education and explanation on how to use Dyspnea scale       Expected Outcomes Short Term: Able to use Dyspnea scale daily in rehab to express subjective sense of shortness of breath during exertion;Long Term: Able to use Dyspnea scale to guide intensity level when exercising independently       Knowledge and understanding of Target Heart Rate Range (THRR) Yes       Intervention Provide education and explanation of THRR including how the numbers were predicted and where they are located for reference       Expected Outcomes Short Term: Able to state/look up THRR;Short Term: Able to use daily as guideline for intensity in rehab;Long Term: Able to use THRR to govern intensity when exercising independently       Able to check pulse independently Yes       Intervention Provide education and demonstration on how to check pulse in carotid and radial arteries.;Review the importance of being able to check your own pulse for safety during independent exercise       Expected Outcomes Short Term: Able to explain why pulse checking is important during independent exercise;Long Term: Able to check pulse independently and accurately       Understanding of Exercise Prescription Yes       Intervention Provide education, explanation, and written materials on patient's individual exercise prescription       Expected Outcomes Short Term: Able to explain program exercise prescription;Long Term: Able to explain home exercise prescription to exercise independently           Exercise Goals Re-Evaluation :  Exercise Goals Re-Evaluation     Row Name 12/21/23 1114 12/29/23 1728 01/16/24 1603         Exercise Goal Re-Evaluation   Exercise Goals Review Increase Physical Activity;Able to understand and use rate of perceived exertion (RPE) scale;Knowledge and understanding of Target Heart Rate Range (THRR);Understanding of Exercise Prescription;Increase Strength and Stamina;Able to check pulse independently;Able to understand and use Dyspnea scale Increase Physical Activity;Understanding of Exercise Prescription;Increase Strength and Stamina Increase  Physical Activity;Understanding of Exercise Prescription;Increase Strength and Stamina     Comments Reviewed RPE and dyspnea scale, THR and program prescription with pt today.  Pt voiced understanding and was given a copy of goals to take home. Luke [name] is off to a good start in the program and he completed his first week in this review. He worked at level 2 on the T4 nustep and biostep. He was able to walk 27 laps on the track. We will continue to monitor his progress in the program. Burnell is doing well in rehab. He began using the XR at level 1 and the T6 nustep at level 2. He also did well on the treadmill at a speed of 1.6 mph with no incline. We will continue to monitor his progress in the program.     Expected Outcomes Short: Use RPE daily to regulate intensity. Long: Follow program prescription in THR. Continue to follow current exercise prescription. Long: Continue exercise to improve strength and stamina. Short: Continue to follow current exercise prescription. Long: Continue exercise to improve strength and stamina.        Discharge Exercise Prescription (Final Exercise Prescription Changes):  Exercise Prescription Changes - 01/16/24 1500       Response to Exercise   Blood Pressure (Admit) 120/62    Blood Pressure (Exercise) 126/72    Blood Pressure (Exit) 104/62    Heart Rate (Admit) 86 bpm    Heart Rate  (Exercise) 104 bpm    Heart Rate (Exit) 96 bpm    Rating of Perceived Exertion (Exercise) 12    Symptoms none    Duration Continue with 30 min of aerobic exercise without signs/symptoms of physical distress.    Intensity THRR unchanged      Progression   Progression Continue to progress workloads to maintain intensity without signs/symptoms of physical distress.    Average METs 2.41      Resistance Training   Weight 3 lb    Reps 10-15      Interval Training   Interval Training No      Treadmill   MPH 1.6    Grade 0    Minutes 15    METs 2.23      NuStep   Level 2   T6 nustep   Minutes 15    METs 2      REL-XR   Level 1    Minutes 15    METs 4      Biostep-RELP   Level 2    Minutes 15      Track   Laps 22    Minutes 15    METs 2.2      Oxygen    Maintain Oxygen  Saturation 88% or higher          Nutrition:  Target Goals: Understanding of nutrition guidelines, daily intake of sodium 1500mg , cholesterol 200mg , calories 30% from fat and 7% or less from saturated fats, daily to have 5 or more servings of fruits and vegetables.  Education: Nutrition 1 -Group instruction provided by verbal, written material, interactive activities, discussions, models, and posters to present general guidelines for heart healthy nutrition including macronutrients, label reading, and promoting whole foods over processed counterparts. Education serves as pensions consultant of discussion of heart healthy eating for all. Written material provided at class time.    Education: Nutrition 2 -Group instruction provided by verbal, written material, interactive activities, discussions, models, and posters to present general guidelines for heart healthy nutrition including sodium, cholesterol, and saturated  fat. Providing guidance of habit forming to improve blood pressure, cholesterol, and body weight. Written material provided at class time.     Biometrics:  Pre Biometrics - 12/12/23 1103        Pre Biometrics   Height 6' 0.4 (1.839 m)    Weight 207 lb 12.8 oz (94.3 kg)    Waist Circumference 45.6 inches    Hip Circumference 41.8 inches    Waist to Hip Ratio 1.09 %    BMI (Calculated) 27.87    Single Leg Stand 22.6 seconds           Nutrition Therapy Plan and Nutrition Goals:  Nutrition Therapy & Goals - 12/12/23 1110       Nutrition Therapy   RD appointment deferred Yes      Personal Nutrition Goals   Nutrition Goal RD appointment deferred at this time      Intervention Plan   Intervention Prescribe, educate and counsel regarding individualized specific dietary modifications aiming towards targeted core components such as weight, hypertension, lipid management, diabetes, heart failure and other comorbidities.    Expected Outcomes Short Term Goal: Understand basic principles of dietary content, such as calories, fat, sodium, cholesterol and nutrients.          Nutrition Assessments:  MEDIFICTS Score Key: >=70 Need to make dietary changes  40-70 Heart Healthy Diet <= 40 Therapeutic Level Cholesterol Diet  Flowsheet Row Cardiac Rehab from 12/12/2023 in [facility] Cardiac and Pulmonary Rehab  Picture Your Plate Total Score on Admission 44   Picture Your Plate Scores: <59 Unhealthy dietary pattern with much room for improvement. 41-50 Dietary pattern unlikely to meet recommendations for good health and room for improvement. 51-60 More healthful dietary pattern, with some room for improvement.  >60 Healthy dietary pattern, although there may be some specific behaviors that could be improved.    Nutrition Goals Re-Evaluation:   Nutrition Goals Discharge (Final Nutrition Goals Re-Evaluation):   Psychosocial: Target Goals: Acknowledge presence or absence of significant depression and/or stress, maximize coping skills, provide positive support system. Participant is able to verbalize types and ability to use techniques and skills needed for reducing stress and  depression.   Education: Stress, Anxiety, and Depression - Group verbal and visual presentation to define topics covered.  Reviews how body is impacted by stress, anxiety, and depression.  Also discusses healthy ways to reduce stress and to treat/manage anxiety and depression. Written material provided at class time.   Education: Sleep Hygiene -Provides group verbal and written instruction about how sleep can affect your health.  Define sleep hygiene, discuss sleep cycles and impact of sleep habits. Review good sleep hygiene tips.   Initial Review & Psychosocial Screening:  Initial Psych Review & Screening - 12/07/23 0941       Initial Review   Current issues with None Identified      Family Dynamics   Good Support System? Yes   wife     Barriers   Psychosocial barriers to participate in program There are no identifiable barriers or psychosocial needs.;The patient should benefit from training in stress management and relaxation.      Screening Interventions   Interventions Encouraged to exercise;Provide feedback about the scores to participant;To provide support and resources with identified psychosocial needs    Expected Outcomes Short Term goal: Utilizing psychosocial counselor, staff and physician to assist with identification of specific Stressors or current issues interfering with healing process. Setting desired goal for each stressor or current issue identified.;Long Term  Goal: Stressors or current issues are controlled or eliminated.;Short Term goal: Identification and review with participant of any Quality of Life or Depression concerns found by scoring the questionnaire.;Long Term goal: The participant improves quality of Life and PHQ9 Scores as seen by post scores and/or verbalization of changes          Quality of Life Scores:   Quality of Life - 12/12/23 1111       Quality of Life   Select Quality of Life      Quality of Life Scores   Health/Function Pre 26 %     Socioeconomic Pre 30 %    Psych/Spiritual Pre 30 %    Family Pre 30 %    GLOBAL Pre 28.24 %         Scores of 19 and below usually indicate a poorer quality of life in these areas.  A difference of  2-3 points is a clinically meaningful difference.  A difference of 2-3 points in the total score of the Quality of Life Index has been associated with significant improvement in overall quality of life, self-image, physical symptoms, and general health in studies assessing change in quality of life.  PHQ-9: Review Flowsheet  More data exists      12/12/2023 10/21/2023 09/26/2023 09/20/2023 08/11/2023  Depression screen PHQ 2/9  Decreased Interest 0 0 0 1 0  Down, Depressed, Hopeless 0 0 0 1 0  PHQ - 2 Score 0 0 0 2 0  Altered sleeping 1 0 0 - 0  Tired, decreased energy 1 0 0 3 0  Change in appetite 1 0 0 0 0  Feeling bad or failure about yourself  0 0 0 1 0  Trouble concentrating 0 0 0 0 0  Moving slowly or fidgety/restless 1 0 0 0 0  Suicidal thoughts 0 0 0 0 0  PHQ-9 Score 4 0  0  - 0   Difficult doing work/chores Somewhat difficult - - Somewhat difficult -    Details       Data saved with a previous flowsheet row definition        Interpretation of Total Score  Total Score Depression Severity:  1-4 = Minimal depression, 5-9 = Mild depression, 10-14 = Moderate depression, 15-19 = Moderately severe depression, 20-27 = Severe depression   Psychosocial Evaluation and Intervention:  Psychosocial Evaluation - 12/07/23 0954       Psychosocial Evaluation & Interventions   Interventions Encouraged to exercise with the program and follow exercise prescription    Comments Luke [name] is coming to cardiac rehab after an MI and Stent. He states he has been feeling better since his recent hospitalization. He has a trach and reports it is doing well. 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 He has a good support system and reports no stress concerns at this time.     Expected Outcomes Short: attend cardiac rehab for education and exercise Long: develop and maintain positive self care habits    Continue Psychosocial Services  Follow up required by staff          Psychosocial Re-Evaluation:   Psychosocial Discharge (Final Psychosocial Re-Evaluation):   Vocational Rehabilitation: Provide vocational rehab assistance to qualifying candidates.   Vocational Rehab Evaluation & Intervention:  Vocational Rehab - 12/07/23 0940       Initial Vocational Rehab Evaluation & Intervention   Assessment shows need for Vocational Rehabilitation No  Education: Education Goals: Education classes will be provided on a variety of topics geared toward better understanding of heart health and risk factor modification. Participant will state understanding/return demonstration of topics presented as noted by education test scores.  Learning Barriers/Preferences:  Learning Barriers/Preferences - 12/07/23 0940       Learning Barriers/Preferences   Learning Barriers None    Learning Preferences None          General Cardiac Education Topics:  AED/CPR: - Group verbal and written instruction with the use of models to demonstrate the basic use of the AED with the basic ABC's of resuscitation.   Test and Procedures: - Group verbal and visual presentation and models provide information about basic cardiac anatomy and function. Reviews the testing methods done to diagnose heart disease and the outcomes of the test results. Describes the treatment choices: Medical Management, Angioplasty, or Coronary Bypass Surgery for treating various heart conditions including Myocardial Infarction, Angina, Valve Disease, and Cardiac Arrhythmias. Written material provided at class time. Flowsheet Row Cardiac Rehab from 12/12/2023 in [facility] Cardiac and Pulmonary Rehab  Education need identified 12/12/23    Medication Safety: - Group verbal and visual instruction to review  commonly prescribed medications for heart and lung disease. Reviews the medication, class of the drug, and side effects. Includes the steps to properly store meds and maintain the prescription regimen. Written material provided at class time.   Intimacy: - Group verbal instruction through game format to discuss how heart and lung disease can affect sexual intimacy. Written material provided at class time.   Know Your Numbers and Heart Failure: - Group verbal and visual instruction to discuss disease risk factors for cardiac and pulmonary disease and treatment options.  Reviews associated critical values for Overweight/Obesity, Hypertension, Cholesterol, and Diabetes.  Discusses basics of heart failure: signs/symptoms and treatments.  Introduces Heart Failure Zone chart for action plan for heart failure. Written material provided at class time.   Infection Prevention: - Provides verbal and written material to individual with discussion of infection control including proper hand washing and proper equipment cleaning during exercise session. Flowsheet Row Cardiac Rehab from 12/12/2023 in [facility] Cardiac and Pulmonary Rehab  Date 12/12/23  Educator MB  Instruction Review Code 1- Verbalizes Understanding    Falls Prevention: - Provides verbal and written material to individual with discussion of falls prevention and safety. Flowsheet Row Cardiac Rehab from 12/12/2023 in [facility] Cardiac and Pulmonary Rehab  Date 12/12/23  Educator MB  Instruction Review Code 1- Verbalizes Understanding    Other: -Provides group and verbal instruction on various topics (see comments)   Knowledge Questionnaire Score:  Knowledge Questionnaire Score - 12/12/23 1111       Knowledge Questionnaire Score   Pre Score 23/26          Core Components/Risk Factors/Patient Goals at Admission:  Personal Goals and Risk Factors at Admission - 12/12/23 1112       Core Components/Risk Factors/Patient Goals on Admission     Weight Management Yes;Weight Loss    Intervention Weight Management: Develop a combined nutrition and exercise program designed to reach desired caloric intake, while maintaining appropriate intake of nutrient and fiber, sodium and fats, and appropriate energy expenditure required for the weight goal.;Weight Management: Provide education and appropriate resources to help participant work on and attain dietary goals.;Weight Management/Obesity: Establish reasonable short term and long term weight goals.    Admit Weight 207 lb 12.8 oz (94.3 kg)    Goal Weight: Short Term 205  lb (93 kg)    Goal Weight: Long Term 200 lb (90.7 kg)    Expected Outcomes Short Term: Continue to assess and modify interventions until short term weight is achieved;Long Term: Adherence to nutrition and physical activity/exercise program aimed toward attainment of established weight goal;Weight Loss: Understanding of general recommendations for a balanced deficit meal plan, which promotes 1-2 lb weight loss per week and includes a negative energy balance of [id number] kcal/d;Understanding recommendations for meals to include 15-35% energy as protein, 25-35% energy from fat, 35-60% energy from carbohydrates, less than 200mg  of dietary cholesterol, 20-35 gm of total fiber daily;Understanding of distribution of calorie intake throughout the day with the consumption of 4-5 meals/snacks    Diabetes Yes    Intervention Provide education about signs/symptoms and action to take for hypo/hyperglycemia.;Provide education about proper nutrition, including hydration, and aerobic/resistive exercise prescription along with prescribed medications to achieve blood glucose in normal ranges: Fasting glucose 65-99 mg/dL    Expected Outcomes Short Term: Participant verbalizes understanding of the signs/symptoms and immediate care of hyper/hypoglycemia, proper foot care and importance of medication, aerobic/resistive exercise and nutrition plan for blood  glucose control.;Long Term: Attainment of HbA1C < 7%.    Lipids Yes    Intervention Provide education and support for participant on nutrition & aerobic/resistive exercise along with prescribed medications to achieve LDL 70mg , HDL >40mg .    Expected Outcomes Short Term: Participant states understanding of desired cholesterol values and is compliant with medications prescribed. Participant is following exercise prescription and nutrition guidelines.;Long Term: Cholesterol controlled with medications as prescribed, with individualized exercise RX and with personalized nutrition plan. Value goals: LDL < 70mg , HDL > 40 mg.          Education:Diabetes - Individual verbal and written instruction to review signs/symptoms of diabetes, desired ranges of glucose level fasting, after meals and with exercise. Acknowledge that pre and post exercise glucose checks will be done for 3 sessions at entry of program. Flowsheet Row Cardiac Rehab from 12/12/2023 in [facility] Cardiac and Pulmonary Rehab  Date 12/12/23  Educator MB  Instruction Review Code 1- Verbalizes Understanding    Core Components/Risk Factors/Patient Goals Review:    Core Components/Risk Factors/Patient Goals at Discharge (Final Review):    ITP Comments:  ITP Comments     Row Name 12/07/23 0954 12/12/23 1058 12/21/23 1114 12/28/23 0954 01/25/24 1155   ITP Comments Initial phone call completed. Diagnosis can be found in [facility] 11/19. EP Orientation scheduled for Monday 12/1 at 9am. Completed and gym orientation for cardiac rehab. Initial ITP created and sent for review to Dr. Oneil Pinal, Medical Director. First full day of exercise!  Patient was oriented to gym and equipment including functions, settings, policies, and procedures.  Patient's individual exercise prescription and treatment plan were reviewed.  All starting workloads were established based on the results of the 6 minute walk test done at initial orientation visit.  The plan for  exercise progression was also introduced and progression will be customized based on patient's performance and goals. 30 Day review completed. Medical Director ITP review done, changes made as directed, and signed approval by Medical Director. 30 Day review completed. Medical Director ITP review done, changes made as directed, and signed approval by Medical Director.      Comments: 30 day review     [1]  Current Outpatient Medications:    aspirin  EC 81 MG tablet, Take 1 tablet (81 mg total) by mouth daily. Swallow whole., Disp: 90 tablet, Rfl: 3  levothyroxine  (SYNTHROID ) 100 MCG tablet, Take 1 tablet (100 mcg total) by mouth daily before breakfast., Disp: 90 tablet, Rfl: 3   lisinopril (ZESTRIL) 5 MG tablet, Take 5 mg by mouth., Disp: , Rfl:    metoprolol succinate (TOPROL-XL) 25 MG 24 hr tablet, Take by mouth., Disp: , Rfl:    midodrine  (PROAMATINE ) 2.5 MG tablet, Take 1 tablet (2.5 mg total) by mouth 2 (two) times daily with a meal. For BP <100/70, Disp: 180 tablet, Rfl: 2   Multiple Vitamin (MULTI-VITAMIN) tablet, Take 1 tablet by mouth daily., Disp: , Rfl:    nitroGLYCERIN (NITROSTAT) 0.4 MG SL tablet, Place 0.4 mg under the tongue every 5 (five) minutes as needed for chest pain., Disp: , Rfl:    omeprazole  (PRILOSEC) 20 MG capsule, Take 1 capsule (20 mg total) by mouth daily., Disp: 90 capsule, Rfl: 3   predniSONE  (DELTASONE ) 50 MG tablet, Take 1 tablet (50 mg total) by mouth daily with breakfast., Disp: 5 tablet, Rfl: 0   rosuvastatin  (CRESTOR ) 40 MG tablet, Take 1 tablet (40 mg total) by mouth daily., Disp: 90 tablet, Rfl: 2   Semaglutide ,0.25 or 0.5MG /DOS, (OZEMPIC , 0.25 OR 0.5 MG/DOSE,) 2 MG/3ML SOPN, Inject 0.25 mg into the skin once a week., Disp: 9 mL, Rfl: 2   tadalafil  (CIALIS ) 5 MG tablet, Take 1 tablet (5 mg total) by mouth daily., Disp: 90 tablet, Rfl: 2   ticagrelor (BRILINTA) 90 MG TABS tablet, Take 90 mg by mouth 2 (two) times daily., Disp: , Rfl:    traZODone  (DESYREL )  100 MG tablet, Take 1 tablet (100 mg total) by mouth at bedtime as needed for sleep., Disp: 90 tablet, Rfl: 2 [2]  Social History Tobacco Use  Smoking Status Former   Types: Cigarettes   Start date: 1970  Smokeless Tobacco Former  Tobacco Comments   05/03/19- down to half a pack or less / day

## 2024-01-27 ENCOUNTER — Encounter

## 2024-01-30 ENCOUNTER — Ambulatory Visit: Payer: Self-pay

## 2024-01-30 ENCOUNTER — Encounter

## 2024-01-30 DIAGNOSIS — I214 Non-ST elevation (NSTEMI) myocardial infarction: Secondary | ICD-10-CM

## 2024-01-30 DIAGNOSIS — Z9861 Coronary angioplasty status: Secondary | ICD-10-CM

## 2024-01-30 NOTE — Telephone Encounter (Signed)
 I have a 1PM tomorrow- we've scheduled him there

## 2024-01-30 NOTE — Telephone Encounter (Signed)
 Would you like to add patient on to your schedule for a earlier appointment?

## 2024-01-30 NOTE — Telephone Encounter (Signed)
 FYI Only or Action Required?: Action required by provider: request for appointment.  Spoke with sister Izetta.Pt fell into wood pile and scraped skin off of arm.  She thinks he has had a tetanus shot within the past 5 years.   Pt is having trouble with medications. He is not getting his medications from pharmacy, and is unsure what he should be taking.   Pt is requesting an appt sooner to discuss meds and get them straightened out.  No appts until next Friday. Would like to be seen sooner - not taking meds.  Call sister Izetta [phone]  Patient was last seen in primary care on 10/21/2023 by Vicci Duwaine SQUIBB, DO.  Called Nurse Triage reporting Fall and Arm Injury.  Symptoms began several days ago.  Interventions attempted: Rest, hydration, or home remedies.  Symptoms are: gradually improving.  Triage Disposition: See PCP When Office is Open (Within 3 Days)  Patient/caregiver understands and will follow disposition?: No, wishes to speak with PCP                        Reason for Triage: patient fell last Thursday and hurt arm  Reason for Disposition  [1] Last tetanus shot > 5 years ago AND [2] DIRTY cut or scrape  Answer Assessment - Initial Assessment Questions 1. MECHANISM: How did the injury happen?     Last thursday 2. ONSET: When did the injury happen? (e.g., minutes, hours ago)      [place] into wood pile 3. LOCATION: Where is the injury located? Which arm?     Arm 4. APPEARANCE of INJURY: What does the injury look like?      Skin peeled back 5. SEVERITY: Can you use the arm normally?      unsure 6. SWELLING or BRUISING: is there any swelling or bruising? If Yes, ask: How large is it? (e.g., inches, centimeters)      unsure 7. PAIN: Is there pain? If Yes, ask: How bad is the pain? (Scale 0-10; or none, mild, moderate, severe)     unsure 8. TETANUS: For any breaks in the skin, ask: When was your last tetanus booster?      Thinks it was within the last 5 years 9. OTHER SYMPTOMS: Do you have any other symptoms?  (e.g., numbness in hand)     Medication issues  Protocols used: Arm Injury-A-AH

## 2024-01-30 NOTE — Telephone Encounter (Signed)
 Notted

## 2024-01-30 NOTE — Progress Notes (Signed)
 Daily Session Note  Patient Details  Name: Luke [name] MRN: [id number] Date of Birth: [birth year] Referring Provider:   Flowsheet Row Cardiac Rehab from 12/12/2023 in [facility] Cardiac and Pulmonary Rehab  Referring Provider [name], MD    Encounter Date: 01/30/2024  Check In:  Session Check In - 01/30/24 1052       Check-In   Supervising physician immediately available to respond to emergencies See telemetry face sheet for immediately available ER MD    Location [facility]-Cardiac & Pulmonary Rehab    Staff Present Burnard Davenport RN,BSN,MPA;Joseph Rolinda RCP,RRT,BSRT;Laura Cates RN,BSN;Maciah Schweigert Dyane BS, ACSM CEP, Exercise Physiologist    Virtual Visit No    Medication changes reported     No    Fall or balance concerns reported    No    Warm-up and Cool-down Performed on first and last piece of equipment    Resistance Training Performed Yes    VAD Patient? No    PAD/SET Patient? No      Pain Assessment   Currently in Pain? No/denies             Tobacco Use History[1]  Goals Met:  Independence with exercise equipment Exercise tolerated well No report of concerns or symptoms today Strength training completed today  Goals Unmet:  Not Applicable  Comments: Pt able to follow exercise prescription today without complaint.  Will continue to monitor for progression.    Dr. Oneil Pinal is Medical Director for [facility] Cardiac Rehabilitation.  Dr. Fuad Aleskerov is Medical Director for [facility] Pulmonary Rehabilitation.    [1]  Social History Tobacco Use  Smoking Status Former   Types: Cigarettes   Start date: 1970  Smokeless Tobacco Former  Tobacco Comments   05/03/19- down to half a pack or less / day

## 2024-01-31 ENCOUNTER — Encounter: Payer: Self-pay | Admitting: Family Medicine

## 2024-01-31 ENCOUNTER — Ambulatory Visit (INDEPENDENT_AMBULATORY_CARE_PROVIDER_SITE_OTHER): Admitting: Family Medicine

## 2024-01-31 VITALS — BP 111/73 | HR 65 | Temp 97.8°F | Resp 17 | Ht 72.4 in | Wt 208.6 lb

## 2024-01-31 DIAGNOSIS — E118 Type 2 diabetes mellitus with unspecified complications: Secondary | ICD-10-CM

## 2024-01-31 DIAGNOSIS — R809 Proteinuria, unspecified: Secondary | ICD-10-CM | POA: Diagnosis not present

## 2024-01-31 DIAGNOSIS — J0100 Acute maxillary sinusitis, unspecified: Secondary | ICD-10-CM | POA: Diagnosis not present

## 2024-01-31 DIAGNOSIS — Z930 Tracheostomy status: Secondary | ICD-10-CM | POA: Diagnosis not present

## 2024-01-31 DIAGNOSIS — Z7985 Long-term (current) use of injectable non-insulin antidiabetic drugs: Secondary | ICD-10-CM | POA: Diagnosis not present

## 2024-01-31 DIAGNOSIS — I959 Hypotension, unspecified: Secondary | ICD-10-CM

## 2024-01-31 DIAGNOSIS — E038 Other specified hypothyroidism: Secondary | ICD-10-CM

## 2024-01-31 DIAGNOSIS — E1129 Type 2 diabetes mellitus with other diabetic kidney complication: Secondary | ICD-10-CM

## 2024-01-31 DIAGNOSIS — K219 Gastro-esophageal reflux disease without esophagitis: Secondary | ICD-10-CM

## 2024-01-31 LAB — MICROALBUMIN, URINE WAIVED
Creatinine, Urine Waived: 300 mg/dL (ref 10–300)
Microalb, Ur Waived: 80 mg/L — ABNORMAL HIGH (ref 0–19)

## 2024-01-31 LAB — BAYER DCA HB A1C WAIVED: HB A1C (BAYER DCA - WAIVED): 6.1 % — ABNORMAL HIGH (ref 4.8–5.6)

## 2024-01-31 MED ORDER — ASPIRIN EC 81 MG PO TBEC: 81.0000 mg | DELAYED_RELEASE_TABLET | Freq: Every day | ORAL | 3 refills | Status: AC

## 2024-01-31 MED ORDER — DOXYCYCLINE HYCLATE 100 MG PO TABS: 100.0000 mg | ORAL_TABLET | Freq: Two times a day (BID) | ORAL | 0 refills | Status: AC

## 2024-01-31 NOTE — Assessment & Plan Note (Signed)
 Went through medicine today- had lisinopril and metoprolol in his meds, but is not supposed to be taking them. Rx cancelled at pharmacy and pills thrown out. Continue to follow with cardiology. Call with any concerns.

## 2024-01-31 NOTE — Assessment & Plan Note (Signed)
 Under good control on current regimen. Continue current regimen. Continue to monitor. Call with any concerns. Refills given. Labs drawn today.

## 2024-01-31 NOTE — Assessment & Plan Note (Signed)
 Rechecking labs today. Await results. Treat as needed.

## 2024-01-31 NOTE — Assessment & Plan Note (Signed)
 Stable. Continue to monitor. Call with any concerns.  ?

## 2024-01-31 NOTE — Progress Notes (Signed)
 "  BP 111/73 (BP Location: Left Arm, Patient Position: Sitting, Cuff Size: Normal)   Pulse 65   Temp 97.8 F (36.6 C) (Oral)   Resp 17   Ht 6' 0.4 (1.839 m)   Wt 208 lb 9.6 oz (94.6 kg)   SpO2 96%   BMI 27.98 kg/m    Subjective:    Patient ID: Luke [name], male    DOB: October 26, 1950, 74 y.o.   MRN: [id number]  HPI: Luke [name] is a 74 y.o. male  Chief Complaint  Patient presents with   Nasal Congestion    Has a cold, congestion all in chest has it for about 2 weeks. Fell 4 days ago and has scar on left arm that he wants provider to look at. Has been keeping vasaline on it and changing dressing twice a day.   HOSPITAL FOLLOW UP Time since discharge: 2.5 months Hospital/facility: [facility] Diagnosis: NSTEMI Procedures/tests: Cardiac cath Consultants: cardiology New medications: None Discharge instructions:  Follow up with cardiology for PCI Status: stable  Had PCI 11/30/23 and has been going to cardiac rehab since then. He saw cardiology about a month ago and they noted that he was hypotensive when he was having anginal episodes so they started him on fiorinef to try to keep his BP up.  UPPER RESPIRATORY TRACT INFECTION Duration: about 2 weeks Worst symptom: runny nose Fever: no Cough: no Shortness of breath: no Wheezing: no Chest pain: no Chest tightness: no Chest congestion: yes Nasal congestion: no Runny nose: yes Post nasal drip: no Sneezing: no Sore throat: yes Swollen glands: no Sinus pressure: no Headache: no Face pain: no Toothache: no Ear pain: no  Ear pressure: no  Eyes red/itching:yes Eye drainage/crusting: no  Vomiting: no Rash: no Fatigue: no Sick contacts: no Strep contacts: no  Context: stable Recurrent sinusitis: no Relief with OTC cold/cough medications: no  Treatments attempted: none   DIABETES Hypoglycemic episodes:no Polydipsia/polyuria: no Visual disturbance: no Chest pain: no Paresthesias: no Glucose Monitoring:  no  Accucheck frequency: Not Checking Taking Insulin?: no Blood Pressure Monitoring: a few times a week Retinal Examination: Up to Date Foot Exam: Up to Date Diabetic Education: Completed Pneumovax: Up to Date Influenza: Not up to Date Aspirin : no  HYPOTHYROIDISM Thyroid  control status:controlled Satisfied with current treatment? yes Medication side effects: no Medication compliance: excellent compliance Recent dose adjustment:no Fatigue: no Cold intolerance: no Heat intolerance: no Weight gain: no Weight loss: no Constipation: no Diarrhea/loose stools: no Palpitations: no Lower extremity edema: no Anxiety/depressed mood: no   Relevant past medical, surgical, family and social history reviewed and updated as indicated. Interim medical history since our last visit reviewed. Allergies and medications reviewed and updated.  Review of Systems  Constitutional: Negative.   Respiratory: Negative.    Cardiovascular: Negative.   Gastrointestinal: Negative.   Musculoskeletal: Negative.   Neurological: Negative.   Psychiatric/Behavioral: Negative.      Per HPI unless specifically indicated above     Objective:    BP 111/73 (BP Location: Left Arm, Patient Position: Sitting, Cuff Size: Normal)   Pulse 65   Temp 97.8 F (36.6 C) (Oral)   Resp 17   Ht 6' 0.4 (1.839 m)   Wt 208 lb 9.6 oz (94.6 kg)   SpO2 96%   BMI 27.98 kg/m   Wt Readings from Last 3 Encounters:  01/31/24 208 lb 9.6 oz (94.6 kg)  12/12/23 207 lb 12.8 oz (94.3 kg)  10/21/23 204 lb 3.2 oz (92.6 kg)  Physical Exam Vitals and nursing note reviewed.  Constitutional:      General: He is not in acute distress.    Appearance: Normal appearance. He is not ill-appearing, toxic-appearing or diaphoretic.  HENT:     Head: Normocephalic and atraumatic.     Right Ear: Tympanic membrane, ear canal and external ear normal.     Left Ear: Tympanic membrane, ear canal and external ear normal.     Nose:  Congestion and rhinorrhea present.     Mouth/Throat:     Mouth: Mucous membranes are moist.     Pharynx: Oropharynx is clear.  Eyes:     General: No scleral icterus.       Right eye: No discharge.        Left eye: No discharge.     Extraocular Movements: Extraocular movements intact.     Conjunctiva/sclera: Conjunctivae normal.     Pupils: Pupils are equal, round, and reactive to light.  Cardiovascular:     Rate and Rhythm: Normal rate and regular rhythm.     Pulses: Normal pulses.     Heart sounds: Normal heart sounds. No murmur heard.    No friction rub. No gallop.  Pulmonary:     Effort: Pulmonary effort is normal. No respiratory distress.     Breath sounds: Normal breath sounds. No stridor. No wheezing, rhonchi or rales.  Chest:     Chest wall: No tenderness.  Musculoskeletal:        General: Normal range of motion.     Cervical back: Normal range of motion and neck supple.  Skin:    General: Skin is warm and dry.     Capillary Refill: Capillary refill takes less than 2 seconds.     Coloration: Skin is not jaundiced or pale.     Findings: No bruising, erythema, lesion or rash.  Neurological:     General: No focal deficit present.     Mental Status: He is alert and oriented to person, place, and time. Mental status is at baseline.  Psychiatric:        Mood and Affect: Mood normal.        Behavior: Behavior normal.        Thought Content: Thought content normal.        Judgment: Judgment normal.     Results for orders placed or performed in visit on 12/26/23  Glucose, capillary   Collection Time: 12/26/23 10:56 AM  Result Value Ref Range   Glucose-Capillary 149 (H) 70 - 99 mg/dL  Glucose, capillary   Collection Time: 12/26/23 12:04 PM  Result Value Ref Range   Glucose-Capillary 86 70 - 99 mg/dL      Assessment & Plan:   Problem List Items Addressed This Visit       Cardiovascular and Mediastinum   Hypotension   Went through medicine today- had lisinopril  and metoprolol in his meds, but is not supposed to be taking them. Rx cancelled at pharmacy and pills thrown out. Continue to follow with cardiology. Call with any concerns.       Relevant Medications   aspirin  EC 81 MG tablet     Digestive   Gastroesophageal reflux disease   Under good control on current regimen. Continue current regimen. Continue to monitor. Call with any concerns. Refills given. Labs drawn today.          Endocrine   Secondary hypothyroidism   Rechecking labs today. Await results. Treat as needed.  Relevant Orders   CBC with Differential/Platelet   Comprehensive metabolic panel with GFR   TSH   Controlled type 2 diabetes mellitus with complication, without long-term current use of insulin (HCC) - Primary   Rechecking labs today. Await results. Treat as needed.       Relevant Medications   aspirin  EC 81 MG tablet   Other Relevant Orders   AMB Referral [facility]BCI Care Management   Bayer DCA Hb A1c Waived   CBC with Differential/Platelet   Comprehensive metabolic panel with GFR   Microalbumin, Urine Waived     Other   Tracheostomy status (HCC)   Stable. Continue to monitor. Call with any concerns.      Other Visit Diagnoses       Acute non-recurrent maxillary sinusitis       Will get him started on doxycycline . Call with any concerns or if not getting better. Call with any concerns .   Relevant Medications   fludrocortisone (FLORINEF) 0.1 MG tablet   doxycycline  (VIBRA -TABS) 100 MG tablet        Follow up plan: Return in about 6 weeks (around 03/13/2024).      "

## 2024-02-01 ENCOUNTER — Encounter: Admitting: Emergency Medicine

## 2024-02-01 DIAGNOSIS — I214 Non-ST elevation (NSTEMI) myocardial infarction: Secondary | ICD-10-CM

## 2024-02-01 DIAGNOSIS — Z9861 Coronary angioplasty status: Secondary | ICD-10-CM

## 2024-02-01 DIAGNOSIS — Z48812 Encounter for surgical aftercare following surgery on the circulatory system: Secondary | ICD-10-CM | POA: Diagnosis not present

## 2024-02-01 LAB — COMPREHENSIVE METABOLIC PANEL WITH GFR
ALT: 16 IU/L (ref 0–44)
AST: 26 IU/L (ref 0–40)
Albumin: 4.1 g/dL (ref 3.8–4.8)
Alkaline Phosphatase: 90 IU/L (ref 47–123)
BUN/Creatinine Ratio: 15 (ref 10–24)
BUN: 18 mg/dL (ref 8–27)
Bilirubin Total: 0.4 mg/dL (ref 0.0–1.2)
CO2: 23 mmol/L (ref 20–29)
Calcium: 9.3 mg/dL (ref 8.6–10.2)
Chloride: 104 mmol/L (ref 96–106)
Creatinine, Ser: 1.22 mg/dL (ref 0.76–1.27)
Globulin, Total: 2 g/dL (ref 1.5–4.5)
Glucose: 122 mg/dL — ABNORMAL HIGH (ref 70–99)
Potassium: 4.2 mmol/L (ref 3.5–5.2)
Sodium: 143 mmol/L (ref 134–144)
Total Protein: 6.1 g/dL (ref 6.0–8.5)
eGFR: 63 mL/min/1.73

## 2024-02-01 LAB — CBC WITH DIFFERENTIAL/PLATELET
Basophils Absolute: 0 x10E3/uL (ref 0.0–0.2)
Basos: 0 %
EOS (ABSOLUTE): 0.4 x10E3/uL (ref 0.0–0.4)
Eos: 5 %
Hematocrit: 40.7 % (ref 37.5–51.0)
Hemoglobin: 12.8 g/dL — ABNORMAL LOW (ref 13.0–17.7)
Immature Grans (Abs): 0 x10E3/uL (ref 0.0–0.1)
Immature Granulocytes: 0 %
Lymphocytes Absolute: 1.1 x10E3/uL (ref 0.7–3.1)
Lymphs: 16 %
MCH: 30.3 pg (ref 26.6–33.0)
MCHC: 31.4 g/dL — ABNORMAL LOW (ref 31.5–35.7)
MCV: 96 fL (ref 79–97)
Monocytes Absolute: 0.4 x10E3/uL (ref 0.1–0.9)
Monocytes: 6 %
Neutrophils Absolute: 5.2 x10E3/uL (ref 1.4–7.0)
Neutrophils: 73 %
Platelets: 227 x10E3/uL (ref 150–450)
RBC: 4.22 x10E6/uL (ref 4.14–5.80)
RDW: 12.6 % (ref 11.6–15.4)
WBC: 7.1 x10E3/uL (ref 3.4–10.8)

## 2024-02-01 LAB — TSH: TSH: 3.72 u[IU]/mL (ref 0.450–4.500)

## 2024-02-01 NOTE — Progress Notes (Signed)
 Daily Session Note  Patient Details  Name: NAITHEN RIVENBURG MRN: [id number] Date of Birth: 03-Jan-1951 Referring Provider:   Flowsheet Row Cardiac Rehab from 12/12/2023 in [facility] Cardiac and Pulmonary Rehab  Referring Provider [name], MD    Encounter Date: 02/01/2024  Check In:  Session Check In - 02/01/24 1112       Check-In   Supervising physician immediately available to respond to emergencies See telemetry face sheet for immediately available ER MD    Location [facility]-Cardiac & Pulmonary Rehab    Staff Present Fairy Plater RCP,RRT,BSRT;Duayne Brideau RN,BSN;Margaret Best, MS, Exercise Physiologist;Maxon Conetta BS, Exercise Physiologist    Virtual Visit No    Medication changes reported     No    Fall or balance concerns reported    No    Warm-up and Cool-down Performed on first and last piece of equipment    Resistance Training Performed Yes    VAD Patient? No    PAD/SET Patient? No      Pain Assessment   Currently in Pain? No/denies             Tobacco Use History[1]  Goals Met:  Independence with exercise equipment Exercise tolerated well No report of concerns or symptoms today Strength training completed today  Goals Unmet:  Not Applicable  Comments: Pt able to follow exercise prescription today without complaint.  Will continue to monitor for progression.    Dr. Oneil Pinal is Medical Director for [facility] Cardiac Rehabilitation.  Dr. Fuad Aleskerov is Medical Director for [facility] Pulmonary Rehabilitation.    [1]  Social History Tobacco Use  Smoking Status Former   Types: Cigarettes   Start date: 1970  Smokeless Tobacco Former  Tobacco Comments   05/03/19- down to half a pack or less / day

## 2024-02-02 ENCOUNTER — Telehealth: Payer: Self-pay | Admitting: Family Medicine

## 2024-02-02 ENCOUNTER — Telehealth: Payer: Self-pay

## 2024-02-02 NOTE — Telephone Encounter (Signed)
 Spoke with patient and let him know to reach out to cardiology since they are the ones that prescribed it. Patient states he will reach out to cardiology.

## 2024-02-02 NOTE — Telephone Encounter (Signed)
 Patient came into the office stating that the medication below is too expensive  ($490.00) he wants to know if there is a different one that he can take that is  less expensive? Please advise .   ticagrelor (BRILINTA) 90 MG TABS tablet

## 2024-02-02 NOTE — Telephone Encounter (Signed)
 I do not write that- it comes from his cardiologist.

## 2024-02-02 NOTE — Progress Notes (Signed)
 Care Guide Pharmacy Note  02/02/2024 Name: DEMETRE MONACO MRN: [id number] DOB: 1950/06/19  Referred By: Vicci Duwaine SQUIBB, DO Reason for referral: Complex Care Management (Outreach to schedule with Pharm d )   JESAIAH FABIANO is a 74 y.o. year old male who is a primary care patient of Vicci Duwaine SQUIBB, DO.  Arley LITTIE Specking was referred to the pharmacist for assistance related to: DMII  An unsuccessful telephone outreach was attempted today to contact the patient who was referred to the pharmacy team for assistance with medication management. Additional attempts will be made to contact the patient.  Jeoffrey Buffalo , RMA     [facility]  [facility] Guide  Direct Dial: [phone]  Website: [email or web address]

## 2024-02-03 ENCOUNTER — Encounter

## 2024-02-03 DIAGNOSIS — Z9861 Coronary angioplasty status: Secondary | ICD-10-CM

## 2024-02-03 DIAGNOSIS — Z48812 Encounter for surgical aftercare following surgery on the circulatory system: Secondary | ICD-10-CM | POA: Diagnosis not present

## 2024-02-03 DIAGNOSIS — I214 Non-ST elevation (NSTEMI) myocardial infarction: Secondary | ICD-10-CM

## 2024-02-03 NOTE — Progress Notes (Signed)
 Daily Session Note  Patient Details  Name: Luke [name] MRN: [id number] Date of Birth: July 25, 1950 Referring Provider:   Flowsheet Row Cardiac Rehab from 12/12/2023 in [facility] Cardiac and Pulmonary Rehab  Referring Provider [name], MD    Encounter Date: 02/03/2024  Check In:  Session Check In - 02/03/24 1119       Check-In   Supervising physician immediately available to respond to emergencies See telemetry face sheet for immediately available ER MD    Location [facility]-Cardiac & Pulmonary Rehab    Staff Present Burnard Davenport RN,BSN,MPA;Maxon Conetta BS, Exercise Physiologist;Joseph Hood RCP,RRT,BSRT;Noah Tickle, [place], Exercise Physiologist    Virtual Visit No    Medication changes reported     No    Fall or balance concerns reported    No    Warm-up and Cool-down Performed on first and last piece of equipment    Resistance Training Performed Yes    VAD Patient? No    PAD/SET Patient? No      Pain Assessment   Currently in Pain? No/denies             Tobacco Use History[1]  Goals Met:  Independence with exercise equipment Exercise tolerated well No report of concerns or symptoms today Strength training completed today  Goals Unmet:  Not Applicable  Comments: Pt able to follow exercise prescription today without complaint.  Will continue to monitor for progression.    Dr. Oneil Pinal is Medical Director for [facility] Cardiac Rehabilitation.  Dr. Fuad Aleskerov is Medical Director for [facility] Pulmonary Rehabilitation.    [1]  Social History Tobacco Use  Smoking Status Former   Types: Cigarettes   Start date: 1970  Smokeless Tobacco Former  Tobacco Comments   05/03/19- down to half a pack or less / day

## 2024-02-04 ENCOUNTER — Ambulatory Visit: Payer: Self-pay | Admitting: Family Medicine

## 2024-02-06 ENCOUNTER — Encounter

## 2024-02-06 NOTE — Progress Notes (Signed)
 Care Guide Pharmacy Note  02/06/2024 Name: AD GUTTMAN MRN: [id number] DOB: Oct 21, 1950  Referred By: Vicci Duwaine SQUIBB, DO Reason for referral: Complex Care Management (Outreach to schedule with Pharm d )   SORIN FRIMPONG is a 74 y.o. year old male who is a primary care patient of Vicci Duwaine SQUIBB, DO.  Arley LITTIE Specking was referred to the pharmacist for assistance related to: DMII  Successful contact was made with the patient to discuss pharmacy services including being ready for the pharmacist to call at least 5 minutes before the scheduled appointment time and to have medication bottles and any blood pressure readings ready for review. The patient agreed to meet with the pharmacist via telephone visit on (date/time).02/23/2024  Jeoffrey Buffalo , RMA       [facility], [facility] Guide  Direct Dial: [phone]  Website: [email or web address]

## 2024-02-07 ENCOUNTER — Encounter: Payer: Self-pay | Admitting: Family Medicine

## 2024-02-08 ENCOUNTER — Encounter

## 2024-02-08 DIAGNOSIS — Z9861 Coronary angioplasty status: Secondary | ICD-10-CM

## 2024-02-08 DIAGNOSIS — I214 Non-ST elevation (NSTEMI) myocardial infarction: Secondary | ICD-10-CM

## 2024-02-08 DIAGNOSIS — Z48812 Encounter for surgical aftercare following surgery on the circulatory system: Secondary | ICD-10-CM | POA: Diagnosis not present

## 2024-02-08 NOTE — Progress Notes (Signed)
 Daily Session Note  Patient Details  Name: Luke [name] MRN: [id number] Date of Birth: 1950/04/07 Referring Provider:   Flowsheet Row Cardiac Rehab from 12/12/2023 in [facility]  Referring Provider [name], MD    Encounter Date: 02/08/2024  Check In:  Session Check In - 02/08/24 1054       Check-In   Supervising physician immediately available to respond to emergencies See telemetry face sheet for immediately available ER MD    Location [facility]-Cardiac & Pulmonary Rehab    Staff Present Burnard Davenport RN,BSN,MPA;Margaret Best, MS, Exercise Physiologist;Noah Tickle, BS, Exercise Physiologist;Laura Cates RN,BSN    Virtual Visit No    Medication changes reported     No    Fall or balance concerns reported    No    Warm-up and Cool-down Performed on first and last piece of equipment    Resistance Training Performed Yes    VAD Patient? No    PAD/SET Patient? No      Pain Assessment   Currently in Pain? No/denies             Tobacco Use History[1]  Goals Met:  Independence with exercise equipment Exercise tolerated well No report of concerns or symptoms today Strength training completed today  Goals Unmet:  Not Applicable  Comments: Pt able to follow exercise prescription today without complaint.  Will continue to monitor for progression.    Dr. Oneil Pinal is Medical Director for [facility] Cardiac Rehabilitation.  Dr. Fuad Aleskerov is Medical Director for [facility] Pulmonary Rehabilitation.    [1]  Social History Tobacco Use  Smoking Status Former   Types: Cigarettes   Start date: 1970  Smokeless Tobacco Former  Tobacco Comments   05/03/19- down to half a pack or less / day

## 2024-02-10 ENCOUNTER — Encounter

## 2024-02-10 DIAGNOSIS — I214 Non-ST elevation (NSTEMI) myocardial infarction: Secondary | ICD-10-CM

## 2024-02-10 DIAGNOSIS — Z48812 Encounter for surgical aftercare following surgery on the circulatory system: Secondary | ICD-10-CM | POA: Diagnosis not present

## 2024-02-10 DIAGNOSIS — Z9861 Coronary angioplasty status: Secondary | ICD-10-CM

## 2024-02-10 NOTE — Progress Notes (Signed)
 Daily Session Note  Patient Details  Name: Luke [name] MRN: [id number] Date of Birth: 02-22-1950 Referring Provider:   Flowsheet Row Cardiac Rehab from 12/12/2023 in [facility] Cardiac and Pulmonary Rehab  Referring Provider [name], MD    Encounter Date: 02/10/2024  Check In:  Session Check In - 02/10/24 1101       Check-In   Supervising physician immediately available to respond to emergencies See telemetry face sheet for immediately available ER MD    Location [facility]-Cardiac & Pulmonary Rehab    Staff Present Burnard Davenport RN,BSN,MPA;[facility] RCP,RRT,BSRT;Laura Cates RN,BSN;Noah Tickle, [place], Exercise Physiologist    Virtual Visit No    Medication changes reported     No    Fall or balance concerns reported    No    Warm-up and Cool-down Performed on first and last piece of equipment    Resistance Training Performed Yes    VAD Patient? No    PAD/SET Patient? No      Pain Assessment   Currently in Pain? No/denies             Tobacco Use History[1]  Goals Met:  Independence with exercise equipment Exercise tolerated well No report of concerns or symptoms today Strength training completed today  Goals Unmet:  Not Applicable  Comments: Pt able to follow exercise prescription today without complaint.  Will continue to monitor for progression.    Dr. Oneil Pinal is Medical Director for [facility] Cardiac Rehabilitation.  Dr. Fuad Aleskerov is Medical Director for [facility] Pulmonary Rehabilitation.    [1]  Social History Tobacco Use  Smoking Status Former   Types: Cigarettes   Start date: 1970  Smokeless Tobacco Former  Tobacco Comments   05/03/19- down to half a pack or less / day

## 2024-02-13 ENCOUNTER — Encounter

## 2024-02-15 ENCOUNTER — Encounter

## 2024-02-15 DIAGNOSIS — I214 Non-ST elevation (NSTEMI) myocardial infarction: Secondary | ICD-10-CM

## 2024-02-15 DIAGNOSIS — Z9861 Coronary angioplasty status: Secondary | ICD-10-CM

## 2024-02-15 NOTE — Progress Notes (Signed)
 Daily Session Note  Patient Details  Name: LAFE CLERK MRN: [id number] Date of Birth: 18-Feb-1950 Referring Provider:   Flowsheet Row Cardiac Rehab from 12/12/2023 in [facility] Re[facility]  Referring Provider [name], MD    Encounter Date: 02/15/2024  Check In:  Session Check In - 02/15/24 1115       Check-In   Supervising physician immediately available to respond to emergencies See telemetry face sheet for immediately available ER MD    Location [facility]-Cardiac & Pulmonary Rehab    Staff Present Leita Franks RN,BSN;[facility] BS, Exercise Physiologist;Margaret Best, MS, Exercise Physiologist    Virtual Visit No    Medication changes reported     No    Fall or balance concerns reported    No    Warm-up and Cool-down Performed on first and last piece of equipment    Resistance Training Performed Yes    VAD Patient? No    PAD/SET Patient? No      Pain Assessment   Currently in Pain? No/denies             Tobacco Use History[1]  Goals Met:  Independence with exercise equipment Exercise tolerated well No report of concerns or symptoms today Strength training completed today  Goals Unmet:  Not Applicable  Comments: Pt able to follow exercise prescription today without complaint.  Will continue to monitor for progression.    Dr. Oneil Pinal is Medical Director for [facility] Cardiac Rehabilitation.  Dr. Fuad Aleskerov is Medical Director for [facility] Pulmonary Rehabilitation.    [1]  Social History Tobacco Use  Smoking Status Former   Types: Cigarettes   Start date: 1970  Smokeless Tobacco Former  Tobacco Comments   05/03/19- down to half a pack or less / day

## 2024-02-17 ENCOUNTER — Encounter

## 2024-02-17 DIAGNOSIS — I214 Non-ST elevation (NSTEMI) myocardial infarction: Secondary | ICD-10-CM

## 2024-02-17 DIAGNOSIS — Z9861 Coronary angioplasty status: Secondary | ICD-10-CM

## 2024-02-17 NOTE — Progress Notes (Signed)
 Daily Session Note  Patient Details  Name: Luke [name] MRN: [id number] Date of Birth: 10-18-1950 Referring Provider:   Flowsheet Row Cardiac Rehab from 12/12/2023 in [facility] Cardiac and Pulmonary Rehab  Referring Provider [name], MD    Encounter Date: 02/17/2024  Check In:  Session Check In - 02/17/24 1056       Check-In   Supervising physician immediately available to respond to emergencies See telemetry face sheet for immediately available ER MD    Location [facility]-Cardiac & Pulmonary Rehab    Staff Present Burnard Davenport RN,BSN,MPA;Maxon Conetta BS, Exercise Physiologist;Joseph [facility] RCP,RRT,BSRT;Noah Tickle, [place], Exercise Physiologist    Virtual Visit No    Medication changes reported     No    Fall or balance concerns reported    No    Warm-up and Cool-down Performed on first and last piece of equipment    Resistance Training Performed Yes    VAD Patient? No    PAD/SET Patient? No      Pain Assessment   Currently in Pain? No/denies             Tobacco Use History[1]  Goals Met:  Independence with exercise equipment Exercise tolerated well No report of concerns or symptoms today Strength training completed today  Goals Unmet:  Not Applicable  Comments: Pt able to follow exercise prescription today without complaint.  Will continue to monitor for progression.    Dr. Oneil Pinal is Medical Director for [facility] Cardiac Rehabilitation.  Dr. Fuad Aleskerov is Medical Director for [facility] Pulmonary Rehabilitation.    [1]  Social History Tobacco Use  Smoking Status Former   Types: Cigarettes   Start date: 1970  Smokeless Tobacco Former  Tobacco Comments   05/03/19- down to half a pack or less / day

## 2024-02-20 ENCOUNTER — Encounter

## 2024-02-22 ENCOUNTER — Encounter

## 2024-02-23 ENCOUNTER — Other Ambulatory Visit

## 2024-02-24 ENCOUNTER — Encounter

## 2024-02-27 ENCOUNTER — Encounter

## 2024-02-29 ENCOUNTER — Encounter

## 2024-03-02 ENCOUNTER — Encounter

## 2024-03-05 ENCOUNTER — Encounter

## 2024-03-07 ENCOUNTER — Encounter

## 2024-03-09 ENCOUNTER — Encounter

## 2024-03-16 ENCOUNTER — Ambulatory Visit: Admitting: Family Medicine

## 2024-05-21 ENCOUNTER — Encounter: Admitting: Family Medicine
